# Patient Record
Sex: Female | Born: 1975
Health system: Southern US, Community
[De-identification: ages and names within clinical notes are randomized; demographics above are authoritative.]

## PROBLEM LIST (undated history)

## (undated) DIAGNOSIS — D219 Benign neoplasm of connective and other soft tissue, unspecified: Secondary | ICD-10-CM

## (undated) DIAGNOSIS — A64 Unspecified sexually transmitted disease: Secondary | ICD-10-CM

## (undated) DIAGNOSIS — J45909 Unspecified asthma, uncomplicated: Secondary | ICD-10-CM

## (undated) DIAGNOSIS — D649 Anemia, unspecified: Secondary | ICD-10-CM

## (undated) DIAGNOSIS — M1990 Unspecified osteoarthritis, unspecified site: Secondary | ICD-10-CM

## (undated) DIAGNOSIS — I10 Essential (primary) hypertension: Secondary | ICD-10-CM

## (undated) DIAGNOSIS — M545 Low back pain, unspecified: Secondary | ICD-10-CM

## (undated) DIAGNOSIS — G43909 Migraine, unspecified, not intractable, without status migrainosus: Secondary | ICD-10-CM

## (undated) DIAGNOSIS — F329 Major depressive disorder, single episode, unspecified: Secondary | ICD-10-CM

## (undated) DIAGNOSIS — R079 Chest pain, unspecified: Secondary | ICD-10-CM

## (undated) DIAGNOSIS — G8929 Other chronic pain: Secondary | ICD-10-CM

## (undated) DIAGNOSIS — M542 Cervicalgia: Secondary | ICD-10-CM

## (undated) DIAGNOSIS — F32A Depression, unspecified: Secondary | ICD-10-CM

## (undated) DIAGNOSIS — F419 Anxiety disorder, unspecified: Secondary | ICD-10-CM

## (undated) DIAGNOSIS — R87619 Unspecified abnormal cytological findings in specimens from cervix uteri: Secondary | ICD-10-CM

## (undated) DIAGNOSIS — K219 Gastro-esophageal reflux disease without esophagitis: Secondary | ICD-10-CM

## (undated) DIAGNOSIS — T7840XA Allergy, unspecified, initial encounter: Secondary | ICD-10-CM

## (undated) DIAGNOSIS — Z8719 Personal history of other diseases of the digestive system: Secondary | ICD-10-CM

## (undated) HISTORY — PX: VAGINAL HYSTERECTOMY: SUR661

## (undated) HISTORY — DX: Chest pain, unspecified: R07.9

## (undated) HISTORY — DX: Low back pain: M54.5

## (undated) HISTORY — PX: COLPOSCOPY: SHX161

## (undated) HISTORY — DX: Unspecified abnormal cytological findings in specimens from cervix uteri: R87.619

## (undated) HISTORY — DX: Depression, unspecified: F32.A

## (undated) HISTORY — DX: Personal history of other diseases of the digestive system: Z87.19

## (undated) HISTORY — DX: Migraine, unspecified, not intractable, without status migrainosus: G43.909

## (undated) HISTORY — DX: Low back pain, unspecified: M54.50

## (undated) HISTORY — DX: Anxiety disorder, unspecified: F41.9

## (undated) HISTORY — DX: Benign neoplasm of connective and other soft tissue, unspecified: D21.9

## (undated) HISTORY — DX: Unspecified sexually transmitted disease: A64

## (undated) HISTORY — DX: Allergy, unspecified, initial encounter: T78.40XA

## (undated) HISTORY — PX: ESOPHAGEAL DILATION: SHX303

## (undated) HISTORY — DX: Other chronic pain: G89.29

## (undated) HISTORY — DX: Unspecified osteoarthritis, unspecified site: M19.90

## (undated) HISTORY — PX: TUBAL LIGATION: SHX77

## (undated) HISTORY — DX: Major depressive disorder, single episode, unspecified: F32.9

---

## 2010-01-28 ENCOUNTER — Emergency Department (HOSPITAL_COMMUNITY): Admission: EM | Admit: 2010-01-28 | Discharge: 2010-01-28 | Payer: Self-pay | Admitting: Family Medicine

## 2010-03-03 ENCOUNTER — Emergency Department (HOSPITAL_COMMUNITY): Admission: EM | Admit: 2010-03-03 | Discharge: 2010-03-03 | Payer: Self-pay | Admitting: Family Medicine

## 2010-08-09 ENCOUNTER — Inpatient Hospital Stay (INDEPENDENT_AMBULATORY_CARE_PROVIDER_SITE_OTHER)
Admission: RE | Admit: 2010-08-09 | Discharge: 2010-08-09 | Disposition: A | Discharge status: Home / Self Care | Payer: Self-pay | Source: Ambulatory Visit | Attending: Emergency Medicine | Admitting: Emergency Medicine

## 2010-08-09 DIAGNOSIS — N760 Acute vaginitis: Secondary | ICD-10-CM

## 2010-08-09 DIAGNOSIS — N83209 Unspecified ovarian cyst, unspecified side: Secondary | ICD-10-CM

## 2010-08-09 LAB — POCT URINALYSIS DIPSTICK
Bilirubin Urine: NEGATIVE
Glucose, UA: NEGATIVE mg/dL
Hgb urine dipstick: NEGATIVE
Ketones, ur: NEGATIVE mg/dL
Nitrite: NEGATIVE
Protein, ur: 30 mg/dL — AB
Specific Gravity, Urine: 1.02 (ref 1.005–1.030)
Urobilinogen, UA: 0.2 mg/dL (ref 0.0–1.0)
pH: 6.5 (ref 5.0–8.0)

## 2010-08-09 LAB — WET PREP, GENITAL
Trich, Wet Prep: NONE SEEN
WBC, Wet Prep HPF POC: NONE SEEN
Yeast Wet Prep HPF POC: NONE SEEN

## 2010-08-09 LAB — POCT PREGNANCY, URINE
Preg Test, Ur: NEGATIVE
Preg Test, Ur: NEGATIVE

## 2010-08-10 LAB — GC/CHLAMYDIA PROBE AMP, GENITAL
Chlamydia, DNA Probe: NEGATIVE
GC Probe Amp, Genital: NEGATIVE

## 2010-08-12 LAB — WET PREP, GENITAL
Trich, Wet Prep: NONE SEEN
Trich, Wet Prep: NONE SEEN
WBC, Wet Prep HPF POC: NONE SEEN
WBC, Wet Prep HPF POC: NONE SEEN
Yeast Wet Prep HPF POC: NONE SEEN
Yeast Wet Prep HPF POC: NONE SEEN

## 2010-08-12 LAB — POCT URINALYSIS DIPSTICK
Bilirubin Urine: NEGATIVE
Glucose, UA: NEGATIVE mg/dL
Glucose, UA: NEGATIVE mg/dL
Hgb urine dipstick: NEGATIVE
Ketones, ur: NEGATIVE mg/dL
Nitrite: NEGATIVE
Nitrite: POSITIVE — AB
Protein, ur: NEGATIVE mg/dL
Protein, ur: 30 mg/dL — AB
Specific Gravity, Urine: 1.025 (ref 1.005–1.030)
Specific Gravity, Urine: 1.025 (ref 1.005–1.030)
Urobilinogen, UA: 0.2 mg/dL (ref 0.0–1.0)
Urobilinogen, UA: 1 mg/dL (ref 0.0–1.0)
pH: 6 (ref 5.0–8.0)
pH: 7 (ref 5.0–8.0)

## 2010-08-12 LAB — GC/CHLAMYDIA PROBE AMP, GENITAL
Chlamydia, DNA Probe: NEGATIVE
Chlamydia, DNA Probe: NEGATIVE
GC Probe Amp, Genital: NEGATIVE
GC Probe Amp, Genital: NEGATIVE

## 2010-08-12 LAB — URINE CULTURE
Colony Count: >=100000
Culture  Setup Time: 201109011340

## 2010-09-09 ENCOUNTER — Other Ambulatory Visit (HOSPITAL_COMMUNITY): Payer: Self-pay | Admitting: Internal Medicine

## 2010-09-09 ENCOUNTER — Ambulatory Visit (HOSPITAL_COMMUNITY)
Admission: RE | Admit: 2010-09-09 | Discharge: 2010-09-09 | Disposition: A | Discharge status: Home / Self Care | Payer: Self-pay | Source: Ambulatory Visit | Attending: Internal Medicine | Admitting: Internal Medicine

## 2010-09-09 DIAGNOSIS — R079 Chest pain, unspecified: Secondary | ICD-10-CM | POA: Insufficient documentation

## 2010-09-15 ENCOUNTER — Emergency Department (HOSPITAL_COMMUNITY): Payer: Self-pay

## 2010-09-15 ENCOUNTER — Emergency Department (HOSPITAL_COMMUNITY)
Admission: EM | Admit: 2010-09-15 | Discharge: 2010-09-15 | Disposition: A | Discharge status: Home / Self Care | Payer: Self-pay | Attending: Emergency Medicine | Admitting: Emergency Medicine

## 2010-09-15 DIAGNOSIS — R1013 Epigastric pain: Secondary | ICD-10-CM | POA: Insufficient documentation

## 2010-09-15 DIAGNOSIS — I10 Essential (primary) hypertension: Secondary | ICD-10-CM | POA: Insufficient documentation

## 2010-09-15 DIAGNOSIS — Z79899 Other long term (current) drug therapy: Secondary | ICD-10-CM | POA: Insufficient documentation

## 2010-09-15 DIAGNOSIS — R0602 Shortness of breath: Secondary | ICD-10-CM | POA: Insufficient documentation

## 2010-09-15 DIAGNOSIS — E876 Hypokalemia: Secondary | ICD-10-CM | POA: Insufficient documentation

## 2010-09-15 DIAGNOSIS — R112 Nausea with vomiting, unspecified: Secondary | ICD-10-CM | POA: Insufficient documentation

## 2010-09-15 DIAGNOSIS — R079 Chest pain, unspecified: Secondary | ICD-10-CM | POA: Insufficient documentation

## 2010-09-15 LAB — COMPREHENSIVE METABOLIC PANEL
ALT: 10 U/L (ref 0–35)
AST: 14 U/L (ref 0–37)
Albumin: 3.6 g/dL (ref 3.5–5.2)
Alkaline Phosphatase: 56 U/L (ref 39–117)
BUN: 11 mg/dL (ref 6–23)
CO2: 33 mEq/L — ABNORMAL HIGH (ref 19–32)
Calcium: 9.1 mg/dL (ref 8.4–10.5)
Chloride: 97 mEq/L (ref 96–112)
Creatinine, Ser: 0.65 mg/dL (ref 0.4–1.2)
GFR calc Af Amer: >60 mL/min (ref >60)
GFR calc non Af Amer: >60 mL/min (ref >60)
Glucose, Bld: 87 mg/dL (ref 70–99)
Potassium: 3.2 mEq/L — ABNORMAL LOW (ref 3.5–5.1)
Sodium: 138 mEq/L (ref 135–145)
Total Bilirubin: 0.4 mg/dL (ref 0.3–1.2)
Total Protein: 7.3 g/dL (ref 6.0–8.3)

## 2010-09-15 LAB — DIFFERENTIAL
Basophils Absolute: 0 10*3/uL (ref 0.0–0.1)
Basophils Relative: 0 % (ref 0–1)
Eosinophils Absolute: 0.2 10*3/uL (ref 0.0–0.7)
Eosinophils Relative: 2 % (ref 0–5)
Lymphocytes Relative: 34 % (ref 12–46)
Lymphs Abs: 3.7 10*3/uL (ref 0.7–4.0)
Monocytes Absolute: 0.9 10*3/uL (ref 0.1–1.0)
Monocytes Relative: 8 % (ref 3–12)
Neutro Abs: 6.1 10*3/uL (ref 1.7–7.7)
Neutrophils Relative %: 56 % (ref 43–77)

## 2010-09-15 LAB — URINALYSIS, ROUTINE W REFLEX MICROSCOPIC
Glucose, UA: NEGATIVE mg/dL
Hgb urine dipstick: NEGATIVE
Ketones, ur: 40 mg/dL — AB
Nitrite: NEGATIVE
Protein, ur: NEGATIVE mg/dL
Specific Gravity, Urine: 1.028 (ref 1.005–1.030)
Urobilinogen, UA: 1 mg/dL (ref 0.0–1.0)
pH: 6.5 (ref 5.0–8.0)

## 2010-09-15 LAB — CBC
HCT: 35.9 % — ABNORMAL LOW (ref 36.0–46.0)
Hemoglobin: 11.7 g/dL — ABNORMAL LOW (ref 12.0–15.0)
MCH: 23.4 pg — ABNORMAL LOW (ref 26.0–34.0)
MCHC: 32.6 g/dL (ref 30.0–36.0)
MCV: 71.7 fL — ABNORMAL LOW (ref 78.0–100.0)
Platelets: 259 10*3/uL (ref 150–400)
RBC: 5.01 MIL/uL (ref 3.87–5.11)
RDW: 14.6 % (ref 11.5–15.5)
WBC: 10.9 10*3/uL — ABNORMAL HIGH (ref 4.0–10.5)

## 2010-09-15 LAB — D-DIMER, QUANTITATIVE: D-Dimer, Quant: 0.9 ug/mL-FEU — ABNORMAL HIGH (ref 0.00–0.48)

## 2010-09-15 LAB — LIPASE, BLOOD: Lipase: 17 U/L (ref 11–59)

## 2010-09-15 MED ORDER — IOHEXOL 300 MG/ML  SOLN
90.0000 mL | Freq: Once | INTRAMUSCULAR | Status: AC | PRN
  Administered 2010-09-15: 90 mL via INTRAVENOUS

## 2010-10-10 ENCOUNTER — Emergency Department (HOSPITAL_COMMUNITY)
Admission: EM | Admit: 2010-10-10 | Discharge: 2010-10-10 | Disposition: A | Discharge status: Home / Self Care | Payer: Self-pay | Attending: Emergency Medicine | Admitting: Emergency Medicine

## 2010-10-10 ENCOUNTER — Inpatient Hospital Stay (INDEPENDENT_AMBULATORY_CARE_PROVIDER_SITE_OTHER): Admit: 2010-10-10 | Discharge: 2010-10-10 | Disposition: A | Discharge status: Home / Self Care | Payer: Self-pay

## 2010-10-10 DIAGNOSIS — L0201 Cutaneous abscess of face: Secondary | ICD-10-CM

## 2010-10-10 DIAGNOSIS — I10 Essential (primary) hypertension: Secondary | ICD-10-CM | POA: Insufficient documentation

## 2010-10-10 DIAGNOSIS — R51 Headache: Secondary | ICD-10-CM | POA: Insufficient documentation

## 2010-10-10 DIAGNOSIS — H538 Other visual disturbances: Secondary | ICD-10-CM | POA: Insufficient documentation

## 2010-10-10 DIAGNOSIS — L03211 Cellulitis of face: Secondary | ICD-10-CM

## 2010-10-10 DIAGNOSIS — Z79899 Other long term (current) drug therapy: Secondary | ICD-10-CM | POA: Insufficient documentation

## 2010-10-10 DIAGNOSIS — R220 Localized swelling, mass and lump, head: Secondary | ICD-10-CM | POA: Insufficient documentation

## 2011-03-08 ENCOUNTER — Emergency Department (HOSPITAL_COMMUNITY)
Admission: EM | Admit: 2011-03-08 | Discharge: 2011-03-08 | Disposition: A | Discharge status: Home / Self Care | Payer: Medicaid Other | Attending: Emergency Medicine | Admitting: Emergency Medicine

## 2011-03-08 ENCOUNTER — Emergency Department (HOSPITAL_COMMUNITY): Payer: Medicaid Other

## 2011-03-08 DIAGNOSIS — D649 Anemia, unspecified: Secondary | ICD-10-CM | POA: Insufficient documentation

## 2011-03-08 DIAGNOSIS — N644 Mastodynia: Secondary | ICD-10-CM | POA: Insufficient documentation

## 2011-03-08 DIAGNOSIS — I10 Essential (primary) hypertension: Secondary | ICD-10-CM | POA: Insufficient documentation

## 2011-03-08 DIAGNOSIS — Z79899 Other long term (current) drug therapy: Secondary | ICD-10-CM | POA: Insufficient documentation

## 2011-03-08 DIAGNOSIS — R071 Chest pain on breathing: Secondary | ICD-10-CM | POA: Insufficient documentation

## 2011-03-08 DIAGNOSIS — R112 Nausea with vomiting, unspecified: Secondary | ICD-10-CM | POA: Insufficient documentation

## 2011-03-08 DIAGNOSIS — J45909 Unspecified asthma, uncomplicated: Secondary | ICD-10-CM | POA: Insufficient documentation

## 2011-03-08 DIAGNOSIS — E876 Hypokalemia: Secondary | ICD-10-CM | POA: Insufficient documentation

## 2011-03-08 DIAGNOSIS — M546 Pain in thoracic spine: Secondary | ICD-10-CM | POA: Insufficient documentation

## 2011-03-08 DIAGNOSIS — M542 Cervicalgia: Secondary | ICD-10-CM | POA: Insufficient documentation

## 2011-03-08 DIAGNOSIS — R42 Dizziness and giddiness: Secondary | ICD-10-CM | POA: Insufficient documentation

## 2011-03-08 LAB — BASIC METABOLIC PANEL
BUN: 10 mg/dL (ref 6–23)
CO2: 27 mEq/L (ref 19–32)
Calcium: 9.5 mg/dL (ref 8.4–10.5)
Chloride: 103 mEq/L (ref 96–112)
Creatinine, Ser: 0.71 mg/dL (ref 0.50–1.10)
GFR calc Af Amer: >90 mL/min (ref >90)
GFR calc non Af Amer: >90 mL/min (ref >90)
Glucose, Bld: 93 mg/dL (ref 70–99)
Potassium: 3.4 mEq/L — ABNORMAL LOW (ref 3.5–5.1)
Sodium: 137 mEq/L (ref 135–145)

## 2011-03-08 LAB — DIFFERENTIAL
Basophils Absolute: 0 10*3/uL (ref 0.0–0.1)
Basophils Relative: 0 % (ref 0–1)
Eosinophils Absolute: 0.1 10*3/uL (ref 0.0–0.7)
Eosinophils Relative: 2 % (ref 0–5)
Lymphocytes Relative: 33 % (ref 12–46)
Lymphs Abs: 2.4 10*3/uL (ref 0.7–4.0)
Monocytes Absolute: 0.6 10*3/uL (ref 0.1–1.0)
Monocytes Relative: 8 % (ref 3–12)
Neutro Abs: 4.1 10*3/uL (ref 1.7–7.7)
Neutrophils Relative %: 57 % (ref 43–77)

## 2011-03-08 LAB — CBC
HCT: 35.2 % — ABNORMAL LOW (ref 36.0–46.0)
Hemoglobin: 11.8 g/dL — ABNORMAL LOW (ref 12.0–15.0)
MCH: 23.8 pg — ABNORMAL LOW (ref 26.0–34.0)
MCHC: 33.5 g/dL (ref 30.0–36.0)
MCV: 71.1 fL — ABNORMAL LOW (ref 78.0–100.0)
Platelets: 242 10*3/uL (ref 150–400)
RBC: 4.95 MIL/uL (ref 3.87–5.11)
RDW: 14.8 % (ref 11.5–15.5)
WBC: 7.2 10*3/uL (ref 4.0–10.5)

## 2011-06-27 ENCOUNTER — Ambulatory Visit: Payer: Self-pay | Admitting: Physical Therapy

## 2011-06-28 ENCOUNTER — Ambulatory Visit: Payer: Medicaid Other | Attending: Diagnostic Neuroimaging | Admitting: Physical Therapy

## 2011-06-28 DIAGNOSIS — M545 Low back pain, unspecified: Secondary | ICD-10-CM | POA: Insufficient documentation

## 2011-06-28 DIAGNOSIS — IMO0001 Reserved for inherently not codable concepts without codable children: Secondary | ICD-10-CM | POA: Insufficient documentation

## 2011-06-28 DIAGNOSIS — M542 Cervicalgia: Secondary | ICD-10-CM | POA: Insufficient documentation

## 2011-07-01 ENCOUNTER — Encounter: Payer: Medicaid Other | Admitting: Physical Therapy

## 2011-07-01 ENCOUNTER — Ambulatory Visit: Payer: Medicaid Other | Admitting: Rehabilitative and Restorative Service Providers"

## 2011-07-06 ENCOUNTER — Ambulatory Visit: Payer: Medicaid Other | Attending: Diagnostic Neuroimaging | Admitting: Physical Therapy

## 2011-07-06 DIAGNOSIS — M545 Low back pain, unspecified: Secondary | ICD-10-CM | POA: Insufficient documentation

## 2011-07-06 DIAGNOSIS — M542 Cervicalgia: Secondary | ICD-10-CM | POA: Insufficient documentation

## 2011-07-06 DIAGNOSIS — IMO0001 Reserved for inherently not codable concepts without codable children: Secondary | ICD-10-CM | POA: Insufficient documentation

## 2011-07-06 DIAGNOSIS — M2569 Stiffness of other specified joint, not elsewhere classified: Secondary | ICD-10-CM | POA: Insufficient documentation

## 2011-07-13 ENCOUNTER — Ambulatory Visit: Payer: Medicaid Other | Admitting: Physical Therapy

## 2011-07-22 ENCOUNTER — Ambulatory Visit: Payer: Medicaid Other | Admitting: Physical Therapy

## 2011-11-14 ENCOUNTER — Emergency Department (HOSPITAL_COMMUNITY)
Admission: EM | Admit: 2011-11-14 | Discharge: 2011-11-15 | Disposition: A | Discharge status: Home / Self Care | Payer: Medicaid Other | Attending: Emergency Medicine | Admitting: Emergency Medicine

## 2011-11-14 ENCOUNTER — Emergency Department (HOSPITAL_COMMUNITY): Payer: Medicaid Other

## 2011-11-14 ENCOUNTER — Encounter (HOSPITAL_COMMUNITY): Payer: Self-pay | Admitting: *Deleted

## 2011-11-14 DIAGNOSIS — R079 Chest pain, unspecified: Secondary | ICD-10-CM | POA: Insufficient documentation

## 2011-11-14 DIAGNOSIS — I10 Essential (primary) hypertension: Secondary | ICD-10-CM | POA: Insufficient documentation

## 2011-11-14 DIAGNOSIS — Z79899 Other long term (current) drug therapy: Secondary | ICD-10-CM | POA: Insufficient documentation

## 2011-11-14 DIAGNOSIS — R0789 Other chest pain: Secondary | ICD-10-CM

## 2011-11-14 DIAGNOSIS — Z880 Allergy status to penicillin: Secondary | ICD-10-CM | POA: Insufficient documentation

## 2011-11-14 DIAGNOSIS — R112 Nausea with vomiting, unspecified: Secondary | ICD-10-CM | POA: Insufficient documentation

## 2011-11-14 DIAGNOSIS — Z91041 Radiographic dye allergy status: Secondary | ICD-10-CM | POA: Insufficient documentation

## 2011-11-14 DIAGNOSIS — F17200 Nicotine dependence, unspecified, uncomplicated: Secondary | ICD-10-CM | POA: Insufficient documentation

## 2011-11-14 DIAGNOSIS — Z888 Allergy status to other drugs, medicaments and biological substances status: Secondary | ICD-10-CM | POA: Insufficient documentation

## 2011-11-14 HISTORY — DX: Unspecified asthma, uncomplicated: J45.909

## 2011-11-14 HISTORY — DX: Essential (primary) hypertension: I10

## 2011-11-14 LAB — CBC
HCT: 34.3 % — ABNORMAL LOW (ref 36.0–46.0)
Hemoglobin: 11.2 g/dL — ABNORMAL LOW (ref 12.0–15.0)
MCH: 23.5 pg — ABNORMAL LOW (ref 26.0–34.0)
MCHC: 32.7 g/dL (ref 30.0–36.0)
MCV: 72.1 fL — ABNORMAL LOW (ref 78.0–100.0)
Platelets: 275 10*3/uL (ref 150–400)
RBC: 4.76 MIL/uL (ref 3.87–5.11)
RDW: 14.8 % (ref 11.5–15.5)
WBC: 7.5 10*3/uL (ref 4.0–10.5)

## 2011-11-14 LAB — COMPREHENSIVE METABOLIC PANEL
ALT: 16 U/L (ref 0–35)
AST: 16 U/L (ref 0–37)
Albumin: 3.4 g/dL — ABNORMAL LOW (ref 3.5–5.2)
Alkaline Phosphatase: 71 U/L (ref 39–117)
BUN: 11 mg/dL (ref 6–23)
CO2: 28 mEq/L (ref 19–32)
Calcium: 9 mg/dL (ref 8.4–10.5)
Chloride: 103 mEq/L (ref 96–112)
Creatinine, Ser: 0.72 mg/dL (ref 0.50–1.10)
GFR calc Af Amer: >90 mL/min (ref >90)
GFR calc non Af Amer: >90 mL/min (ref >90)
Glucose, Bld: 83 mg/dL (ref 70–99)
Potassium: 3.6 mEq/L (ref 3.5–5.1)
Sodium: 139 mEq/L (ref 135–145)
Total Bilirubin: 0.2 mg/dL — ABNORMAL LOW (ref 0.3–1.2)
Total Protein: 7.1 g/dL (ref 6.0–8.3)

## 2011-11-14 LAB — DIFFERENTIAL
Basophils Absolute: 0 10*3/uL (ref 0.0–0.1)
Basophils Relative: 0 % (ref 0–1)
Eosinophils Absolute: 0.2 10*3/uL (ref 0.0–0.7)
Eosinophils Relative: 2 % (ref 0–5)
Lymphocytes Relative: 30 % (ref 12–46)
Lymphs Abs: 2.3 10*3/uL (ref 0.7–4.0)
Monocytes Absolute: 0.7 10*3/uL (ref 0.1–1.0)
Monocytes Relative: 9 % (ref 3–12)
Neutro Abs: 4.4 10*3/uL (ref 1.7–7.7)
Neutrophils Relative %: 58 % (ref 43–77)

## 2011-11-14 LAB — URINALYSIS, ROUTINE W REFLEX MICROSCOPIC
Bilirubin Urine: NEGATIVE
Glucose, UA: NEGATIVE mg/dL
Hgb urine dipstick: NEGATIVE
Ketones, ur: NEGATIVE mg/dL
Leukocytes, UA: NEGATIVE
Nitrite: NEGATIVE
Protein, ur: NEGATIVE mg/dL
Specific Gravity, Urine: 1.015 (ref 1.005–1.030)
Urobilinogen, UA: 0.2 mg/dL (ref 0.0–1.0)
pH: 7 (ref 5.0–8.0)

## 2011-11-14 LAB — TROPONIN I: Troponin I: <0.3 ng/mL (ref <0.30)

## 2011-11-14 LAB — PREGNANCY, URINE: Preg Test, Ur: NEGATIVE

## 2011-11-14 MED ORDER — KETOROLAC TROMETHAMINE 60 MG/2ML IM SOLN
60.0000 mg | Freq: Once | INTRAMUSCULAR | Status: AC
  Administered 2011-11-14: 60 mg via INTRAMUSCULAR
  Filled 2011-11-14: qty 2

## 2011-11-14 MED ORDER — OXYCODONE-ACETAMINOPHEN 5-325 MG PO TABS
1.0000 | ORAL_TABLET | Freq: Once | ORAL | Status: AC
  Administered 2011-11-14: 1 via ORAL
  Filled 2011-11-14: qty 1

## 2011-11-14 MED ORDER — GI COCKTAIL ~~LOC~~
30.0000 mL | Freq: Once | ORAL | Status: AC
  Administered 2011-11-14: 30 mL via ORAL
  Filled 2011-11-14: qty 30

## 2011-11-14 NOTE — ED Notes (Signed)
The pt has had chest discomfort abd pain and vomiting with tingling inher rt arm for 3 days.  She has been taking prilosec for the same with no relief

## 2011-11-14 NOTE — ED Provider Notes (Signed)
History     CSN: [id number]  Arrival date & time 11/14/11  1631   First MD Initiated Contact with Patient 11/14/11 2028      Chief Complaint  Patient presents with  . Chest Pain    (Consider location/radiation/quality/duration/timing/severity/associated sxs/prior treatment) HPI Comments: Pt with PMH GERD presents with c/o CP x 3 days - located substernally without radiation, worse with lying flat or after meals. Associated with several episodes of nonbloody nonbilious emesis. Has had occasional tingling to R arm. Has taken Prilosec daily, up to 3 tabs (60 mg) without relief. No other tx prior to arrival.  Patient has no prior history of DVT/PE. Denies recent trauma, surgery, or prolonged immobilization. Denies hemoptysis, leg swelling, or use of exogenous estrogen.  Patient is a 36 y.o. female presenting with chest pain. The history is provided by the patient.  Chest Pain The chest pain began 3 - 5 days ago. Chest pain occurs intermittently. The chest pain is unchanged. The pain is associated with eating. The severity of the pain is moderate. The quality of the pain is described as heavy. The pain does not radiate. Chest pain is worsened by certain positions. Primary symptoms include nausea and vomiting. Pertinent negatives for primary symptoms include no fever, no fatigue, no syncope, no shortness of breath, no cough, no wheezing, no palpitations, no abdominal pain, no dizziness and no altered mental status.  Pertinent negatives for associated symptoms include no diaphoresis, no lower extremity edema, no numbness, no orthopnea, no paroxysmal nocturnal dyspnea and no weakness. She tried proton pump inhibitors for the symptoms.     Past Medical History  Diagnosis Date  . Asthma   . Hypertension     No past surgical history on file.  No family history on file.  History  Substance Use Topics  . Smoking status: Current Everyday Smoker  . Smokeless tobacco: Not on file  . Alcohol  Use: No  Pt denies etoh use  OB History    Grav Para Term Preterm Abortions TAB SAB Ect Mult Living                  Review of Systems  Constitutional: Negative for fever, diaphoresis and fatigue.  Respiratory: Negative for cough, shortness of breath and wheezing.   Cardiovascular: Positive for chest pain. Negative for palpitations, orthopnea and syncope.  Gastrointestinal: Positive for nausea and vomiting. Negative for abdominal pain.  Neurological: Negative for dizziness, weakness and numbness.  Psychiatric/Behavioral: Negative for altered mental status.    Allergies  Reglan; Amoxicillin; and Contrast media  Home Medications   Current Outpatient Rx  Name Route Sig Dispense Refill  . DULOXETINE HCL 60 MG PO CPEP Oral Take 60 mg by mouth daily.    Marland Kitchen HYDROCHLOROTHIAZIDE 25 MG PO TABS Oral Take 25 mg by mouth daily.    Marland Kitchen OMEPRAZOLE 20 MG PO CPDR Oral Take 20 mg by mouth daily as needed. For indigestion    . OXYCODONE-ACETAMINOPHEN 5-325 MG PO TABS Oral Take 1 tablet by mouth 4 (four) times daily.      BP 157/104  Pulse 67  Temp 98.1 F (36.7 C) (Oral)  Resp 16  SpO2 100%  Physical Exam  Nursing note and vitals reviewed. Constitutional: She appears well-developed and well-nourished. No distress.  HENT:  Head: Normocephalic and atraumatic.  Mouth/Throat: Oropharynx is clear and moist. No oropharyngeal exudate.  Eyes: EOM are normal. Pupils are equal, round, and reactive to light.  Neck: Normal range of motion.  Cardiovascular: Normal rate, regular rhythm and normal heart sounds.  Exam reveals no gallop and no friction rub.   No murmur heard. Pulmonary/Chest: Effort normal and breath sounds normal. She has no wheezes. She exhibits tenderness.       Reproducibly tender over sternum  Abdominal: Soft. Bowel sounds are normal. There is no tenderness. There is no rebound and no guarding.  Musculoskeletal: Normal range of motion.  Neurological: She is alert.  Skin: Skin is  warm and dry. She is not diaphoretic.  Psychiatric: She has a normal mood and affect.    ED Course  Procedures (including critical care time)   Date: 11/14/2011  Rate: 84  Rhythm: normal sinus rhythm  QRS Axis: normal  Intervals: normal  ST/T Wave abnormalities: normal  Conduction Disutrbances:none  Narrative Interpretation:   Old EKG Reviewed: unchanged  Labs Reviewed  CBC - Abnormal; Notable for the following:    Hemoglobin 11.2 (*)     HCT 34.3 (*)     MCV 72.1 (*)     MCH 23.5 (*)     All other components within normal limits  COMPREHENSIVE METABOLIC PANEL - Abnormal; Notable for the following:    Albumin 3.4 (*)     Total Bilirubin 0.2 (*)     All other components within normal limits  URINALYSIS, ROUTINE W REFLEX MICROSCOPIC  PREGNANCY, URINE  DIFFERENTIAL  TROPONIN I   Dg Chest 2 View  11/14/2011  *RADIOLOGY REPORT*  Clinical Data: Chest pain, shortness of breath.  CHEST - 2 VIEW  Comparison: 03/08/2011  Findings: Heart and mediastinal contours are within normal limits. No focal opacities or effusions.  No acute bony abnormality.  IMPRESSION: No active cardiopulmonary disease.  Original Report Authenticated By: Cyndie Chime, M.D.     No diagnosis found.    MDM  Pt with reported several days of chest pain, associated with lying flat, associated with nausea/vomiting. Pain is reproducible over the sternum. Pt's labs appear reassuring. She has taken Prilosec at home without significant relief - she was initially administered Toradol here without much sx relief. Plan to re-medicate with GI cocktail. Based on pt's symptoms, vitals, ECG, and history, I do not suspect worrisome etiology such as ACS or PE.  Signed out to [place], PA-C at 11 PM. Anticipate pt can be dced home.      Grant Fontana, PA-C 11/14/11 2300

## 2011-11-14 NOTE — ED Provider Notes (Signed)
Medical screening examination/treatment/procedure(s) were performed by non-physician practitioner and as supervising physician I was immediately available for consultation/collaboration.   Dhanya Bogle B. Bernette Mayers, MD 11/14/11 2300

## 2011-11-15 NOTE — Discharge Instructions (Signed)
Pain of Unknown Etiology (Pain Without a Known Cause) You have come to your caregiver because of pain. Pain can occur in any part of the body. Often there is not a definite cause. If your laboratory (blood or urine) work was normal and x-rays or other studies were normal, your caregiver may treat you without knowing the cause of the pain. An example of this is the headache. Most headaches are diagnosed by taking a history. This means your caregiver asks you questions about your headaches. Your caregiver determines a treatment based on your answers. Usually testing done for headaches is normal. Often testing is not done unless there is no response to medications. Regardless of where your pain is located today, you can be given medications to make you comfortable. If no physical cause of pain can be found, most cases of pain will gradually leave as suddenly as they came.  If you have a painful condition and no reason can be found for the pain, It is importantthat you follow up with your caregiver. If the pain becomes worse or does not go away, it may be necessary to repeat tests and look further for a possible cause.  Only take over-the-counter or prescription medicines for pain, discomfort, or fever as directed by your caregiver.   For the protection of your privacy, test results can not be given over the phone. Make sure you receive the results of your test. Ask as to how these results are to be obtained if you have not been informed. It is your responsibility to obtain your test results.   You may continue all activities unless the activities cause more pain. When the pain lessens, it is important to gradually resume normal activities. Resume activities by beginning slowly and gradually increasing the intensity and duration of the activities or exercise. During periods of severe pain, bed-rest may be helpful. Lay or sit in any position that is comfortable.   Ice used for acute (sudden) conditions may be  effective. Use a large plastic bag filled with ice and wrapped in a towel. This may provide pain relief.   See your caregiver for continued problems. They can help or refer you for exercises or physical therapy if necessary.  If you were given medications for your condition, do not drive, operate machinery or power tools, or sign legal documents for 24 hours. Do not drink alcohol, take sleeping pills, or take other medications that may interfere with treatment. See your caregiver immediately if you have pain that is becoming worse and not relieved by medications. Document Released: 02/08/2001 Document Revised: 05/05/2011 Document Reviewed: 05/16/2005 [employer] Patient Information 2012 [employer]. 

## 2011-11-15 NOTE — ED Notes (Signed)
No rx given, pt voiced understanding to f/u with Jovita Kussmaul of GI doctor and return for worsening of sx

## 2011-11-15 NOTE — ED Provider Notes (Signed)
Medical screening examination/treatment/procedure(s) were performed by non-physician practitioner and as supervising physician I was immediately available for consultation/collaboration.  Flint Melter, MD 11/15/11 1945

## 2011-11-15 NOTE — ED Provider Notes (Signed)
History     CSN: [id number]  Arrival date & time 11/14/11  1631   First MD Initiated Contact with Patient 11/14/11 2028      Chief Complaint  Patient presents with  . Chest Pain    (Consider location/radiation/quality/duration/timing/severity/associated sxs/prior treatment) HPI  Past Medical History  Diagnosis Date  . Asthma   . Hypertension     No past surgical history on file.  No family history on file.  History  Substance Use Topics  . Smoking status: Current Everyday Smoker  . Smokeless tobacco: Not on file  . Alcohol Use: No    OB History    Grav Para Term Preterm Abortions TAB SAB Ect Mult Living                  Review of Systems  Allergies  Reglan; Amoxicillin; and Contrast media  Home Medications   Current Outpatient Rx  Name Route Sig Dispense Refill  . DULOXETINE HCL 60 MG PO CPEP Oral Take 60 mg by mouth daily.    Marland Kitchen HYDROCHLOROTHIAZIDE 25 MG PO TABS Oral Take 25 mg by mouth daily.    Marland Kitchen OMEPRAZOLE 20 MG PO CPDR Oral Take 20 mg by mouth daily as needed. For indigestion    . OXYCODONE-ACETAMINOPHEN 5-325 MG PO TABS Oral Take 1 tablet by mouth 4 (four) times daily.      BP 148/90  Pulse 67  Temp 97.8 F (36.6 C) (Oral)  Resp 18  SpO2 100%  Physical Exam  ED Course  Procedures (including critical care time)  Labs Reviewed  CBC - Abnormal; Notable for the following:    Hemoglobin 11.2 (*)     HCT 34.3 (*)     MCV 72.1 (*)     MCH 23.5 (*)     All other components within normal limits  COMPREHENSIVE METABOLIC PANEL - Abnormal; Notable for the following:    Albumin 3.4 (*)     Total Bilirubin 0.2 (*)     All other components within normal limits  URINALYSIS, ROUTINE W REFLEX MICROSCOPIC  PREGNANCY, URINE  DIFFERENTIAL  TROPONIN I   Dg Chest 2 View  11/14/2011  *RADIOLOGY REPORT*  Clinical Data: Chest pain, shortness of breath.  CHEST - 2 VIEW  Comparison: 03/08/2011  Findings: Heart and mediastinal contours are within normal  limits. No focal opacities or effusions.  No acute bony abnormality.  IMPRESSION: No active cardiopulmonary disease.  Original Report Authenticated By: Cyndie Chime, M.D.     Atypical chest pain   MDM  Care of patient taken over from C. Mayford Knife, PA-C - please see her note for HPI, ROS and PE - patient reports no relief in pain - states she thinks this is her GERD and has been taking double the amount of prilosec as well as daily percocet without relief of pain - there is no evidence for ACS, patient will be discharged home with follow up with PCP.        Izola Price [place], [place] 11/15/11 [phone]

## 2011-11-25 ENCOUNTER — Encounter: Payer: Self-pay | Admitting: Gastroenterology

## 2011-11-29 ENCOUNTER — Telehealth: Payer: Self-pay | Admitting: Gastroenterology

## 2011-11-29 ENCOUNTER — Ambulatory Visit (INDEPENDENT_AMBULATORY_CARE_PROVIDER_SITE_OTHER): Payer: Medicaid Other | Admitting: Gastroenterology

## 2011-11-29 ENCOUNTER — Encounter: Payer: Self-pay | Admitting: Gastroenterology

## 2011-11-29 VITALS — BP 116/80 | HR 68 | Ht 64.0 in | Wt 197.2 lb

## 2011-11-29 DIAGNOSIS — R1310 Dysphagia, unspecified: Secondary | ICD-10-CM

## 2011-11-29 NOTE — Telephone Encounter (Signed)
Left message on machine to call back  

## 2011-11-29 NOTE — Telephone Encounter (Signed)
Pt was advised that we have no sooner appts and she would be put on a wait list.  Pt will call back if she has increasing pain

## 2011-11-29 NOTE — Patient Instructions (Addendum)
You will be set up for an upper endoscopy at [facility] MAC sedation, possible dilation (for dysphagia/odynophagia). Smaller, more frequent meals (3-4 meals a day)

## 2011-11-29 NOTE — Progress Notes (Signed)
HPI: This is a  very pleasant 36-year-old woman whom I am meeting for the first time today.    2 weeks ago labs showed she had slight microcytic anemia. Complete metabolic profile was normal. Urinalysis was normal and pregnancy test was negative.  CXR for chest pain, SOB 2 weeks ago was normal.  About 2 weeks of solid dysphagia.  Usually waits and it will pass, sometimes will have to vomit.  Has odynophagia.  Overall she gained 50 pounds in 2 months.    Was given predisone and cipro by her PCP.  No abx 2-3 months ago.  She had EGD many years ago for the exact same problem.  She was told she had acid related problems.  Currently on prilosec for a long time,   She eats fruit throughout the day, but other than that one large meal a day.  On narcotics for back pains  Review of systems: Pertinent positive and negative review of systems were noted in the above HPI section. Complete review of systems was performed and was otherwise normal.    Past Medical History  Diagnosis Date  . Asthma   . Hypertension     Past Surgical History  Procedure Date  . Vaginal hysterectomy     Current Outpatient Prescriptions  Medication Sig Dispense Refill  . amLODipine (NORVASC) 5 MG tablet Take 5 mg by mouth daily.      . DULoxetine (CYMBALTA) 60 MG capsule Take 60 mg by mouth daily.      . hydrochlorothiazide (HYDRODIURIL) 25 MG tablet Take 25 mg by mouth daily.      . omeprazole (PRILOSEC) 20 MG capsule Take 20 mg by mouth daily as needed. For indigestion      . oxyCODONE-acetaminophen (PERCOCET) 5-325 MG per tablet Take 1 tablet by mouth 4 (four) times daily.        Allergies as of 11/29/2011 - Review Complete 11/29/2011  Allergen Reaction Noted  . Reglan (metoclopramide) Shortness Of Breath 11/14/2011  . Amoxicillin Rash 11/14/2011  . Contrast media (iodinated diagnostic agents) Rash 11/14/2011    Family History  Problem Relation Age of Onset  . Heart disease Father   . Hypertension  Mother     History   Social History  . Marital Status: Married    Spouse Name: N/A    Number of Children: 2  . Years of Education: N/A   Occupational History  . Homemaker    Social History Main Topics  . Smoking status: Current Some Day Smoker  . Smokeless tobacco: Never Used  . Alcohol Use: No  . Drug Use: No  . Sexually Active: Not on file   Other Topics Concern  . Not on file   Social History Narrative  . No narrative on file       Physical Exam: BP 116/80  Pulse 68  Ht 5' 4" (1.626 m)  Wt 197 lb 3.2 oz (89.449 kg)  BMI 33.85 kg/m2 Constitutional: generally well-appearing Psychiatric: alert and oriented x3 Eyes: extraocular movements intact Mouth: oral pharynx moist, no lesions Neck: supple no lymphadenopathy Cardiovascular: heart regular rate and rhythm Lungs: clear to auscultation bilaterally Abdomen: soft, nontender, nondistended, no obvious ascites, no peritoneal signs, normal bowel sounds Extremities: no lower extremity edema bilaterally Skin: no lesions on visible extremities    Assessment and plan: 36 y.o. female with   several weeks of dysphagia, odynophagia  she has gained 50 pounds in 2 months she tells me. This could certainly cause a lot   of GI symptoms. He does seem that she has dysphagia to solid foods and we'll proceed with EGD at her soonest convenience. Perhaps she has esophageal narrowing. Gastroparesis can call us gastric retention and this can feel similar to dysphagia to some patients. Recommended that she try eating several smaller more frequent meals a day. Chronic narcotics could predispose her to gastroparesis.     

## 2011-12-22 ENCOUNTER — Encounter (HOSPITAL_COMMUNITY): Payer: Self-pay | Admitting: Anesthesiology

## 2011-12-22 ENCOUNTER — Ambulatory Visit (HOSPITAL_COMMUNITY): Payer: Medicaid Other | Admitting: Anesthesiology

## 2011-12-22 ENCOUNTER — Ambulatory Visit (HOSPITAL_COMMUNITY)
Admission: RE | Admit: 2011-12-22 | Discharge: 2011-12-22 | Disposition: A | Discharge status: Home / Self Care | Payer: Medicaid Other | Source: Ambulatory Visit | Attending: Gastroenterology | Admitting: Gastroenterology

## 2011-12-22 ENCOUNTER — Encounter (HOSPITAL_COMMUNITY)
Admission: RE | Disposition: A | Discharge status: Home / Self Care | Payer: Self-pay | Source: Ambulatory Visit | Attending: Gastroenterology

## 2011-12-22 ENCOUNTER — Encounter (HOSPITAL_COMMUNITY): Payer: Self-pay

## 2011-12-22 DIAGNOSIS — I10 Essential (primary) hypertension: Secondary | ICD-10-CM | POA: Insufficient documentation

## 2011-12-22 DIAGNOSIS — R635 Abnormal weight gain: Secondary | ICD-10-CM | POA: Insufficient documentation

## 2011-12-22 DIAGNOSIS — Z79899 Other long term (current) drug therapy: Secondary | ICD-10-CM | POA: Insufficient documentation

## 2011-12-22 DIAGNOSIS — K222 Esophageal obstruction: Secondary | ICD-10-CM | POA: Insufficient documentation

## 2011-12-22 DIAGNOSIS — R1310 Dysphagia, unspecified: Secondary | ICD-10-CM

## 2011-12-22 HISTORY — DX: Anemia, unspecified: D64.9

## 2011-12-22 HISTORY — DX: Migraine, unspecified, not intractable, without status migrainosus: G43.909

## 2011-12-22 SURGERY — ESOPHAGOGASTRODUODENOSCOPY (EGD) WITH PROPOFOL
Anesthesia: Monitor Anesthesia Care

## 2011-12-22 MED ORDER — MIDAZOLAM HCL 5 MG/5ML IJ SOLN
INTRAMUSCULAR | Status: DC | PRN
  Administered 2011-12-22: 2 mg via INTRAVENOUS

## 2011-12-22 MED ORDER — PROPOFOL 10 MG/ML IV EMUL
INTRAVENOUS | Status: DC | PRN
  Administered 2011-12-22: 30 mg via INTRAVENOUS
  Administered 2011-12-22: 40 mg via INTRAVENOUS

## 2011-12-22 MED ORDER — PROPOFOL 10 MG/ML IV EMUL
INTRAVENOUS | Status: DC | PRN
  Administered 2011-12-22: 75 ug/kg/min via INTRAVENOUS

## 2011-12-22 MED ORDER — LACTATED RINGERS IV SOLN
INTRAVENOUS | Status: DC
  Administered 2011-12-22: 09:00:00 via INTRAVENOUS

## 2011-12-22 MED ORDER — FENTANYL CITRATE 0.05 MG/ML IJ SOLN
INTRAMUSCULAR | Status: DC | PRN
  Administered 2011-12-22: 100 ug via INTRAVENOUS

## 2011-12-22 SURGICAL SUPPLY — 14 items

## 2011-12-22 NOTE — Transfer of Care (Signed)
Immediate Anesthesia Transfer of Care Note  Patient: Suzanne [name]  Procedure(s) Performed: Procedure(s) (LRB): ESOPHAGOGASTRODUODENOSCOPY (EGD) WITH PROPOFOL (N/A)  Patient Location: Endoscopy Unit  Anesthesia Type: MAC  Level of Consciousness: awake and alert   Airway & Oxygen Therapy: Patient Spontanous Breathing and Patient connected to nasal cannula oxygen  Post-op Assessment: Report given to PACU RN and Post -op Vital signs reviewed and stable  Post vital signs: Reviewed and stable  Complications: No apparent anesthesia complications

## 2011-12-22 NOTE — Interval H&P Note (Signed)
History and Physical Interval Note:  12/22/2011 9:02 AM  Suzanne [name]  has presented today for surgery, with the diagnosis of GERD (gastroesophageal reflux disease) [530.81] Esophageal pain [530.89]  The various methods of treatment have been discussed with the patient and family. After consideration of risks, benefits and other options for treatment, the patient has consented to  Procedure(s) (LRB): ESOPHAGOGASTRODUODENOSCOPY (EGD) WITH PROPOFOL (N/A) as a surgical intervention .  The patient's history has been reviewed, patient examined, no change in status, stable for surgery.  I have reviewed the patient's chart and labs.  Questions were answered to the patient's satisfaction.     Rob Bunting

## 2011-12-22 NOTE — Anesthesia Postprocedure Evaluation (Signed)
  Anesthesia Post-op Note  Patient: Suzanne [name]  Procedure(s) Performed: Procedure(s) (LRB): ESOPHAGOGASTRODUODENOSCOPY (EGD) WITH PROPOFOL (N/A)  Patient Location: PACU  Anesthesia Type: MAC  Level of Consciousness: awake and alert   Airway and Oxygen Therapy: Patient Spontanous Breathing  Post-op Pain: mild  Post-op Assessment: Post-op Vital signs reviewed, Patient's Cardiovascular Status Stable, Respiratory Function Stable, Patent Airway and No signs of Nausea or vomiting  Post-op Vital Signs: stable  Complications: No apparent anesthesia complications

## 2011-12-22 NOTE — Preoperative (Signed)
Beta Blockers   Reason not to administer Beta Blockers:pt took norvasc this am

## 2011-12-22 NOTE — H&P (View-Only) (Signed)
HPI: This is a  very pleasant 36 year old woman whom I am meeting for the first time today.    2 weeks ago labs showed she had slight microcytic anemia. Complete metabolic profile was normal. Urinalysis was normal and pregnancy test was negative.  CXR for chest pain, SOB 2 weeks ago was normal.  About 2 weeks of solid dysphagia.  Usually waits and it will pass, sometimes will have to vomit.  Has odynophagia.  Overall she gained 50 pounds in 2 months.    Was given predisone and cipro by her PCP.  No abx 2-3 months ago.  She had EGD many years ago for the exact same problem.  She was told she had acid related problems.  Currently on prilosec for a long time,   She eats fruit throughout the day, but other than that one large meal a day.  On narcotics for back pains  Review of systems: Pertinent positive and negative review of systems were noted in the above HPI section. Complete review of systems was performed and was otherwise normal.    Past Medical History  Diagnosis Date  . Asthma   . Hypertension     Past Surgical History  Procedure Date  . Vaginal hysterectomy     Current Outpatient Prescriptions  Medication Sig Dispense Refill  . amLODipine (NORVASC) 5 MG tablet Take 5 mg by mouth daily.      . DULoxetine (CYMBALTA) 60 MG capsule Take 60 mg by mouth daily.      . hydrochlorothiazide (HYDRODIURIL) 25 MG tablet Take 25 mg by mouth daily.      Marland Kitchen omeprazole (PRILOSEC) 20 MG capsule Take 20 mg by mouth daily as needed. For indigestion      . oxyCODONE-acetaminophen (PERCOCET) 5-325 MG per tablet Take 1 tablet by mouth 4 (four) times daily.        Allergies as of 11/29/2011 - Review Complete 11/29/2011  Allergen Reaction Noted  . Reglan (metoclopramide) Shortness Of Breath 11/14/2011  . Amoxicillin Rash 11/14/2011  . Contrast media (iodinated diagnostic agents) Rash 11/14/2011    Family History  Problem Relation Age of Onset  . Heart disease Father   . Hypertension  Mother     History   Social History  . Marital Status: Married    Spouse Name: N/A    Number of Children: 2  . Years of Education: N/A   Occupational History  . Homemaker    Social History Main Topics  . Smoking status: Current Some Day Smoker  . Smokeless tobacco: Never Used  . Alcohol Use: No  . Drug Use: No  . Sexually Active: Not on file   Other Topics Concern  . Not on file   Social History Narrative  . No narrative on file       Physical Exam: BP 116/80  Pulse 68  Ht 5\' 4"  (1.626 m)  Wt 197 lb 3.2 oz (89.449 kg)  BMI 33.85 kg/m2 Constitutional: generally well-appearing Psychiatric: alert and oriented x3 Eyes: extraocular movements intact Mouth: oral pharynx moist, no lesions Neck: supple no lymphadenopathy Cardiovascular: heart regular rate and rhythm Lungs: clear to auscultation bilaterally Abdomen: soft, nontender, nondistended, no obvious ascites, no peritoneal signs, normal bowel sounds Extremities: no lower extremity edema bilaterally Skin: no lesions on visible extremities    Assessment and plan: 36 y.o. female with   several weeks of dysphagia, odynophagia  she has gained 50 pounds in 2 months she tells me. This could certainly cause a lot  of GI symptoms. He does seem that she has dysphagia to solid foods and we'll proceed with EGD at her soonest convenience. Perhaps she has esophageal narrowing. Gastroparesis can call us gastric retention and this can feel similar to dysphagia to some patients. Recommended that she try eating several smaller more frequent meals a day. Chronic narcotics could predispose her to gastroparesis.

## 2011-12-22 NOTE — Op Note (Signed)
[facility] [address] [place], [place]  [address]  ENDOSCOPY PROCEDURE REPORT  PATIENT:  Suzanne [name], Suzanne [name]  MR#:  [id number] BIRTHDATE:  01-Sep-1975, 36 yrs. old  GENDER:  female ENDOSCOPIST:  Rachael Fee, MD Referred by:  Clyda Greener, M.D. PROCEDURE DATE:  12/22/2011 PROCEDURE:  EGD with balloon dilatation ASA CLASS:  Class II INDICATIONS:  intermittent dysphagia, large weight gain recently MEDICATIONS:   MAC sedation, administered by CRNA TOPICAL ANESTHETIC:  Cetacaine Spray  DESCRIPTION OF PROCEDURE:   After the risks benefits and alternatives of the procedure were thoroughly explained, informed consent was obtained.  The Pentax Gastroscope [id number] endoscope was introduced through the mouth and advanced to the second portion of the duodenum, without limitations.  The instrument was slowly withdrawn as the mucosa was fully examined. <<PROCEDUREIMAGES>> There was mild narrowing at GE junction, this was smooth, focal, benign appearing with normal mucosa. This was dilated using 20mm CRE TTS balloon held inflated for 1 minute (see image5 and image8).  Otherwise the examination was normal (see image1, image2, image3, and image4).    Retroflexed views revealed no abnormalities.    The scope was then withdrawn from the patient and the procedure completed. COMPLICATIONS:  None  ENDOSCOPIC IMPRESSION: 1) Benign, smooth, mild narrowing at GE junction. This was dilated to 20mm. 2) Otherwise normal examination  RECOMMENDATIONS: Chew your food well, eat slowly and take small bites.  Losing weight gradually can help GI symptoms as well.  ______________________________ Rachael Fee, MD  n. eSIGNED:   Rachael Fee at 12/22/2011 10:22 AM  Leonides Sake, [id number]

## 2011-12-22 NOTE — Anesthesia Preprocedure Evaluation (Signed)
Anesthesia Evaluation  Patient identified by MRN, date of birth, ID band Patient awake    Reviewed: Allergy & Precautions, H&P , NPO status , Patient's Chart, lab work & pertinent test results  Airway Mallampati: II TM Distance: <3 FB Neck ROM: Full    Dental No notable dental hx.    Pulmonary neg pulmonary ROS, Current Smoker,  breath sounds clear to auscultation  Pulmonary exam normal       Cardiovascular hypertension, Pt. on medications Rhythm:Regular Rate:Normal     Neuro/Psych negative neurological ROS  negative psych ROS   GI/Hepatic Neg liver ROS, GERD-  Medicated,  Endo/Other  negative endocrine ROS  Renal/GU negative Renal ROS  negative genitourinary   Musculoskeletal negative musculoskeletal ROS (+)   Abdominal   Peds negative pediatric ROS (+)  Hematology negative hematology ROS (+)   Anesthesia Other Findings   Reproductive/Obstetrics negative OB ROS                           Anesthesia Physical Anesthesia Plan  ASA: II  Anesthesia Plan: MAC   Post-op Pain Management:    Induction: Intravenous  Airway Management Planned: Nasal Cannula  Additional Equipment:   Intra-op Plan:   Post-operative Plan:   Informed Consent: I have reviewed the patients History and Physical, chart, labs and discussed the procedure including the risks, benefits and alternatives for the proposed anesthesia with the patient or authorized representative who has indicated his/her understanding and acceptance.   Dental advisory given  Plan Discussed with: CRNA and Surgeon  Anesthesia Plan Comments:         Anesthesia Quick Evaluation

## 2012-08-15 ENCOUNTER — Telehealth: Payer: Self-pay | Admitting: Diagnostic Neuroimaging

## 2012-08-15 NOTE — Telephone Encounter (Signed)
Pt called and wanting a refill for imitrex which has worked well for her.   Have not seen her since 05-2011.  Needs appts.  Early Friday am or Tuesday am.

## 2012-08-16 NOTE — Telephone Encounter (Signed)
Sched appt for 08/31/12 @ 10

## 2012-08-31 ENCOUNTER — Ambulatory Visit: Payer: Self-pay | Admitting: Diagnostic Neuroimaging

## 2012-10-08 ENCOUNTER — Encounter (HOSPITAL_COMMUNITY): Payer: Self-pay

## 2012-10-08 ENCOUNTER — Emergency Department (HOSPITAL_COMMUNITY)
Admission: EM | Admit: 2012-10-08 | Discharge: 2012-10-08 | Disposition: A | Discharge status: Home / Self Care | Payer: Medicaid Other | Attending: Emergency Medicine | Admitting: Emergency Medicine

## 2012-10-08 DIAGNOSIS — X58XXXA Exposure to other specified factors, initial encounter: Secondary | ICD-10-CM | POA: Insufficient documentation

## 2012-10-08 DIAGNOSIS — Z79899 Other long term (current) drug therapy: Secondary | ICD-10-CM | POA: Insufficient documentation

## 2012-10-08 DIAGNOSIS — F17200 Nicotine dependence, unspecified, uncomplicated: Secondary | ICD-10-CM | POA: Insufficient documentation

## 2012-10-08 DIAGNOSIS — Y939 Activity, unspecified: Secondary | ICD-10-CM | POA: Insufficient documentation

## 2012-10-08 DIAGNOSIS — T148XXA Other injury of unspecified body region, initial encounter: Secondary | ICD-10-CM

## 2012-10-08 DIAGNOSIS — Z880 Allergy status to penicillin: Secondary | ICD-10-CM | POA: Insufficient documentation

## 2012-10-08 DIAGNOSIS — Z862 Personal history of diseases of the blood and blood-forming organs and certain disorders involving the immune mechanism: Secondary | ICD-10-CM | POA: Insufficient documentation

## 2012-10-08 DIAGNOSIS — S139XXA Sprain of joints and ligaments of unspecified parts of neck, initial encounter: Secondary | ICD-10-CM | POA: Insufficient documentation

## 2012-10-08 DIAGNOSIS — J069 Acute upper respiratory infection, unspecified: Secondary | ICD-10-CM | POA: Insufficient documentation

## 2012-10-08 DIAGNOSIS — J45909 Unspecified asthma, uncomplicated: Secondary | ICD-10-CM | POA: Insufficient documentation

## 2012-10-08 DIAGNOSIS — J029 Acute pharyngitis, unspecified: Secondary | ICD-10-CM

## 2012-10-08 DIAGNOSIS — R6889 Other general symptoms and signs: Secondary | ICD-10-CM | POA: Insufficient documentation

## 2012-10-08 DIAGNOSIS — Y929 Unspecified place or not applicable: Secondary | ICD-10-CM | POA: Insufficient documentation

## 2012-10-08 DIAGNOSIS — I10 Essential (primary) hypertension: Secondary | ICD-10-CM | POA: Insufficient documentation

## 2012-10-08 DIAGNOSIS — Z8679 Personal history of other diseases of the circulatory system: Secondary | ICD-10-CM | POA: Insufficient documentation

## 2012-10-08 LAB — RAPID STREP SCREEN (MED CTR MEBANE ONLY): Streptococcus, Group A Screen (Direct): NEGATIVE

## 2012-10-08 MED ORDER — METHOCARBAMOL 500 MG PO TABS: 500.0000 mg | ORAL_TABLET | Freq: Two times a day (BID) | ORAL | Status: DC

## 2012-10-08 NOTE — ED Provider Notes (Signed)
History     CSN: [id number]  Arrival date & time 10/08/12  9562   First MD Initiated Contact with Patient 10/08/12 0636      Chief Complaint  Patient presents with  . Sore Throat    (Consider location/radiation/quality/duration/timing/severity/associated sxs/prior treatment) HPI Comments: Patient presents emergency department with chief complaint of sore throat and runny nose. She states that she has been having a sore throat times one week. She has not tried anything to alleviate her symptoms. She denies fevers, chills, nausea, vomiting, or cough. Additionally, she states that she has had muscle tightness in her neck. She states that she felt like she slept wrong, and has had a "kink" in her neck. She states that the pain/tightness radiates up her neck and to her ear.  The pain is moderate.  The history is provided by the patient. No language interpreter was used.    Past Medical History  Diagnosis Date  . Asthma   . Hypertension   . Migraine   . Anemia     Past Surgical History  Procedure Laterality Date  . Vaginal hysterectomy    . Tubal ligation      Family History  Problem Relation Age of Onset  . Heart disease Father   . Hypertension Mother     History  Substance Use Topics  . Smoking status: Current Some Day Smoker -- 0.10 packs/day for 10 years  . Smokeless tobacco: Never Used  . Alcohol Use: No    OB History   Grav Para Term Preterm Abortions TAB SAB Ect Mult Living   2 2  2             Review of Systems  All other systems reviewed and are negative.    Allergies  Reglan; Amoxicillin; and Contrast media  Home Medications   Current Outpatient Rx  Name  Route  Sig  Dispense  Refill  . amLODipine (NORVASC) 5 MG tablet   Oral   Take 5 mg by mouth daily.         . hydrochlorothiazide (HYDRODIURIL) 25 MG tablet   Oral   Take 25 mg by mouth daily.         Marland Kitchen oxyCODONE-acetaminophen (PERCOCET) 5-325 MG per tablet   Oral   Take 1 tablet by  mouth 4 (four) times daily.           BP 163/99  Pulse 66  Temp(Src) 98.6 F (37 C) (Oral)  Resp 17  Ht 5\' 4"  (1.626 m)  SpO2 99%  Physical Exam  Nursing note and vitals reviewed. Constitutional: She is oriented to person, place, and time. She appears well-developed and well-nourished.  HENT:  Head: Normocephalic and atraumatic.  Red oropharynx, without presence of exudates, no signs of tonsillar or peritonsillar abscesses, uvula is midline, airway is intact, no sign of Ludwig's angina, no cervical spine tenderness, no pain on palpation of posterior neck, no signs of meningismus.  Eyes: Conjunctivae and EOM are normal.  Neck: Normal range of motion.  Left-sided SCM tender to palpation, no cervical adenopathy, range of motion 5/5  Cardiovascular: Normal rate.   Pulmonary/Chest: Effort normal.  Abdominal: She exhibits no distension.  Musculoskeletal: Normal range of motion.  Neurological: She is alert and oriented to person, place, and time.  Skin: Skin is dry.  Psychiatric: She has a normal mood and affect. Her behavior is normal. Judgment and thought content normal.    ED Course  Procedures (including critical care time)  Labs Reviewed  RAPID STREP SCREEN   Results for orders placed during the hospital encounter of 10/08/12  RAPID STREP SCREEN      Result Value Range   Streptococcus, Group A Screen (Direct) NEGATIVE  NEGATIVE      1. Muscle strain   2. Sore throat   3. URI (upper respiratory infection)       MDM  Patient with sore throat and runny nose.  Likely due to allergies or URI. Will check rapid strep, and will reevaluate. No fevers, chills, nausea, vomiting or cough.  Patient also complains of muscle tenderness to the the left SCM.  She states that she slept funny. She thinks that she has a "kink" in her neck.  She has been trying to take percocet and flexeril with no relief.  Rapid strep is negative.  Suspect that the sore throat is due to  allergies/URI with as she has had associated runny nose.  Suspect muscle strain.  No neck stiffness, meningismus, or fevers.  No submandibular swelling.  No signs of Ludwig's.  Suspect MSK pain, as her left SCM is tender to palpation.  Will switch her to Robaxin and encourage her to ice the affected area.        Roxy Horseman, PA-C 10/08/12 [phone]

## 2012-10-08 NOTE — ED Notes (Signed)
The patient states that she has had a sore throat x 1 week.  Since then, the pain has gotten worse, and it has spread to her left side of her face and her left ear.

## 2012-10-08 NOTE — ED Provider Notes (Signed)
Medical screening examination/treatment/procedure(s) were performed by non-physician practitioner and as supervising physician I was immediately available for consultation/collaboration.   Angeliyah Kirkey B. Bernette Mayers, MD 10/08/12 2017

## 2012-10-08 NOTE — ED Notes (Signed)
PA at bedside.

## 2012-10-16 ENCOUNTER — Other Ambulatory Visit: Payer: Self-pay | Admitting: Diagnostic Neuroimaging

## 2013-02-01 ENCOUNTER — Other Ambulatory Visit: Payer: Self-pay | Admitting: Family Medicine

## 2013-02-01 DIAGNOSIS — R1013 Epigastric pain: Secondary | ICD-10-CM

## 2013-02-02 ENCOUNTER — Emergency Department (HOSPITAL_COMMUNITY): Payer: Medicaid Other

## 2013-02-02 ENCOUNTER — Encounter (HOSPITAL_COMMUNITY): Payer: Self-pay | Admitting: *Deleted

## 2013-02-02 ENCOUNTER — Emergency Department (HOSPITAL_COMMUNITY)
Admission: EM | Admit: 2013-02-02 | Discharge: 2013-02-02 | Disposition: A | Discharge status: Home / Self Care | Payer: Medicaid Other | Attending: Emergency Medicine | Admitting: Emergency Medicine

## 2013-02-02 DIAGNOSIS — S46909A Unspecified injury of unspecified muscle, fascia and tendon at shoulder and upper arm level, unspecified arm, initial encounter: Secondary | ICD-10-CM | POA: Insufficient documentation

## 2013-02-02 DIAGNOSIS — S161XXA Strain of muscle, fascia and tendon at neck level, initial encounter: Secondary | ICD-10-CM

## 2013-02-02 DIAGNOSIS — Y9389 Activity, other specified: Secondary | ICD-10-CM | POA: Insufficient documentation

## 2013-02-02 DIAGNOSIS — T07XXXA Unspecified multiple injuries, initial encounter: Secondary | ICD-10-CM | POA: Insufficient documentation

## 2013-02-02 DIAGNOSIS — G43909 Migraine, unspecified, not intractable, without status migrainosus: Secondary | ICD-10-CM | POA: Insufficient documentation

## 2013-02-02 DIAGNOSIS — M25552 Pain in left hip: Secondary | ICD-10-CM

## 2013-02-02 DIAGNOSIS — S79919A Unspecified injury of unspecified hip, initial encounter: Secondary | ICD-10-CM | POA: Insufficient documentation

## 2013-02-02 DIAGNOSIS — M25512 Pain in left shoulder: Secondary | ICD-10-CM

## 2013-02-02 DIAGNOSIS — J45909 Unspecified asthma, uncomplicated: Secondary | ICD-10-CM | POA: Insufficient documentation

## 2013-02-02 DIAGNOSIS — F17200 Nicotine dependence, unspecified, uncomplicated: Secondary | ICD-10-CM | POA: Insufficient documentation

## 2013-02-02 DIAGNOSIS — Z791 Long term (current) use of non-steroidal anti-inflammatories (NSAID): Secondary | ICD-10-CM | POA: Insufficient documentation

## 2013-02-02 DIAGNOSIS — Y92410 Unspecified street and highway as the place of occurrence of the external cause: Secondary | ICD-10-CM | POA: Insufficient documentation

## 2013-02-02 DIAGNOSIS — IMO0002 Reserved for concepts with insufficient information to code with codable children: Secondary | ICD-10-CM | POA: Insufficient documentation

## 2013-02-02 DIAGNOSIS — I10 Essential (primary) hypertension: Secondary | ICD-10-CM | POA: Insufficient documentation

## 2013-02-02 DIAGNOSIS — Z79899 Other long term (current) drug therapy: Secondary | ICD-10-CM | POA: Insufficient documentation

## 2013-02-02 DIAGNOSIS — Z862 Personal history of diseases of the blood and blood-forming organs and certain disorders involving the immune mechanism: Secondary | ICD-10-CM | POA: Insufficient documentation

## 2013-02-02 DIAGNOSIS — S4980XA Other specified injuries of shoulder and upper arm, unspecified arm, initial encounter: Secondary | ICD-10-CM | POA: Insufficient documentation

## 2013-02-02 DIAGNOSIS — S139XXA Sprain of joints and ligaments of unspecified parts of neck, initial encounter: Secondary | ICD-10-CM | POA: Insufficient documentation

## 2013-02-02 HISTORY — DX: Other chronic pain: G89.29

## 2013-02-02 HISTORY — DX: Cervicalgia: M54.2

## 2013-02-02 MED ORDER — KETOROLAC TROMETHAMINE 60 MG/2ML IM SOLN
60.0000 mg | Freq: Once | INTRAMUSCULAR | Status: AC
  Administered 2013-02-02: 60 mg via INTRAMUSCULAR
  Filled 2013-02-02: qty 2

## 2013-02-02 MED ORDER — DIAZEPAM 5 MG/ML IJ SOLN: 10.0000 mg | Freq: Once | INTRAMUSCULAR | Status: DC

## 2013-02-02 MED ORDER — METHOCARBAMOL 500 MG PO TABS: 500.0000 mg | ORAL_TABLET | Freq: Two times a day (BID) | ORAL | Status: DC

## 2013-02-02 MED ORDER — NAPROXEN 500 MG PO TABS: 500.0000 mg | ORAL_TABLET | Freq: Two times a day (BID) | ORAL | Status: DC

## 2013-02-02 MED ORDER — DIAZEPAM 5 MG/ML IJ SOLN
10.0000 mg | Freq: Once | INTRAMUSCULAR | Status: AC
  Administered 2013-02-02: 10 mg via INTRAMUSCULAR
  Filled 2013-02-02: qty 2
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
## 2013-02-03 NOTE — ED Provider Notes (Signed)
Medical screening examination/treatment/procedure(s) were performed by non-physician practitioner and as supervising physician I was immediately available for consultation/collaboration.   Dione Booze, MD 02/03/13 1504

## 2013-02-05 ENCOUNTER — Ambulatory Visit (HOSPITAL_COMMUNITY)
Admission: RE | Admit: 2013-02-05 | Discharge: 2013-02-05 | Disposition: A | Discharge status: Home / Self Care | Payer: Medicaid Other | Source: Ambulatory Visit | Attending: Family Medicine | Admitting: Family Medicine

## 2013-02-05 DIAGNOSIS — R1013 Epigastric pain: Secondary | ICD-10-CM

## 2013-02-05 DIAGNOSIS — R1310 Dysphagia, unspecified: Secondary | ICD-10-CM | POA: Insufficient documentation

## 2013-02-11 ENCOUNTER — Encounter: Payer: Self-pay | Admitting: *Deleted

## 2013-02-26 ENCOUNTER — Encounter (HOSPITAL_COMMUNITY): Payer: Self-pay | Admitting: Adult Health

## 2013-02-26 ENCOUNTER — Encounter (HOSPITAL_COMMUNITY): Payer: Self-pay | Admitting: *Deleted

## 2013-02-26 ENCOUNTER — Emergency Department (HOSPITAL_COMMUNITY): Payer: Medicaid Other

## 2013-02-26 ENCOUNTER — Emergency Department (HOSPITAL_COMMUNITY)
Admission: EM | Admit: 2013-02-26 | Discharge: 2013-02-26 | Disposition: A | Discharge status: Nursing Home (Includes ICF) | Payer: Medicaid Other | Source: Home / Self Care

## 2013-02-26 ENCOUNTER — Emergency Department (HOSPITAL_COMMUNITY)
Admission: EM | Admit: 2013-02-26 | Discharge: 2013-02-26 | Disposition: A | Discharge status: Home / Self Care | Payer: Medicaid Other | Attending: Emergency Medicine | Admitting: Emergency Medicine

## 2013-02-26 DIAGNOSIS — R1013 Epigastric pain: Secondary | ICD-10-CM

## 2013-02-26 DIAGNOSIS — Z862 Personal history of diseases of the blood and blood-forming organs and certain disorders involving the immune mechanism: Secondary | ICD-10-CM | POA: Insufficient documentation

## 2013-02-26 DIAGNOSIS — G43909 Migraine, unspecified, not intractable, without status migrainosus: Secondary | ICD-10-CM | POA: Insufficient documentation

## 2013-02-26 DIAGNOSIS — R112 Nausea with vomiting, unspecified: Secondary | ICD-10-CM | POA: Insufficient documentation

## 2013-02-26 DIAGNOSIS — M542 Cervicalgia: Secondary | ICD-10-CM | POA: Insufficient documentation

## 2013-02-26 DIAGNOSIS — G8929 Other chronic pain: Secondary | ICD-10-CM | POA: Insufficient documentation

## 2013-02-26 DIAGNOSIS — K219 Gastro-esophageal reflux disease without esophagitis: Secondary | ICD-10-CM | POA: Insufficient documentation

## 2013-02-26 DIAGNOSIS — Z79899 Other long term (current) drug therapy: Secondary | ICD-10-CM | POA: Insufficient documentation

## 2013-02-26 DIAGNOSIS — R1310 Dysphagia, unspecified: Secondary | ICD-10-CM

## 2013-02-26 DIAGNOSIS — Z9851 Tubal ligation status: Secondary | ICD-10-CM | POA: Insufficient documentation

## 2013-02-26 DIAGNOSIS — Z90710 Acquired absence of both cervix and uterus: Secondary | ICD-10-CM | POA: Insufficient documentation

## 2013-02-26 DIAGNOSIS — Z791 Long term (current) use of non-steroidal anti-inflammatories (NSAID): Secondary | ICD-10-CM | POA: Insufficient documentation

## 2013-02-26 DIAGNOSIS — F17200 Nicotine dependence, unspecified, uncomplicated: Secondary | ICD-10-CM | POA: Insufficient documentation

## 2013-02-26 DIAGNOSIS — R52 Pain, unspecified: Secondary | ICD-10-CM

## 2013-02-26 DIAGNOSIS — I10 Essential (primary) hypertension: Secondary | ICD-10-CM | POA: Insufficient documentation

## 2013-02-26 DIAGNOSIS — Z880 Allergy status to penicillin: Secondary | ICD-10-CM | POA: Insufficient documentation

## 2013-02-26 DIAGNOSIS — Z9889 Other specified postprocedural states: Secondary | ICD-10-CM | POA: Insufficient documentation

## 2013-02-26 DIAGNOSIS — R110 Nausea: Secondary | ICD-10-CM

## 2013-02-26 DIAGNOSIS — J45909 Unspecified asthma, uncomplicated: Secondary | ICD-10-CM | POA: Insufficient documentation

## 2013-02-26 HISTORY — DX: Gastro-esophageal reflux disease without esophagitis: K21.9

## 2013-02-26 LAB — CBC WITH DIFFERENTIAL/PLATELET
Basophils Absolute: 0 10*3/uL (ref 0.0–0.1)
Basophils Relative: 0 % (ref 0–1)
Eosinophils Absolute: 0.2 10*3/uL (ref 0.0–0.7)
Eosinophils Relative: 2 % (ref 0–5)
HCT: 34.7 % — ABNORMAL LOW (ref 36.0–46.0)
Hemoglobin: 11.6 g/dL — ABNORMAL LOW (ref 12.0–15.0)
Lymphocytes Relative: 21 % (ref 12–46)
Lymphs Abs: 2.3 10*3/uL (ref 0.7–4.0)
MCH: 23.5 pg — ABNORMAL LOW (ref 26.0–34.0)
MCHC: 33.4 g/dL (ref 30.0–36.0)
MCV: 70.4 fL — ABNORMAL LOW (ref 78.0–100.0)
Monocytes Absolute: 1.1 10*3/uL — ABNORMAL HIGH (ref 0.1–1.0)
Monocytes Relative: 10 % (ref 3–12)
Neutro Abs: 7.2 10*3/uL (ref 1.7–7.7)
Neutrophils Relative %: 67 % (ref 43–77)
Platelets: 306 10*3/uL (ref 150–400)
RBC: 4.93 MIL/uL (ref 3.87–5.11)
RDW: 15.8 % — ABNORMAL HIGH (ref 11.5–15.5)
WBC: 10.8 10*3/uL — ABNORMAL HIGH (ref 4.0–10.5)

## 2013-02-26 LAB — COMPREHENSIVE METABOLIC PANEL
ALT: 9 U/L (ref 0–35)
AST: 13 U/L (ref 0–37)
Albumin: 3.4 g/dL — ABNORMAL LOW (ref 3.5–5.2)
Alkaline Phosphatase: 81 U/L (ref 39–117)
BUN: 9 mg/dL (ref 6–23)
CO2: 31 mEq/L (ref 19–32)
Calcium: 9.4 mg/dL (ref 8.4–10.5)
Chloride: 98 mEq/L (ref 96–112)
Creatinine, Ser: 0.66 mg/dL (ref 0.50–1.10)
GFR calc Af Amer: >90 mL/min (ref >90)
GFR calc non Af Amer: >90 mL/min (ref >90)
Glucose, Bld: 90 mg/dL (ref 70–99)
Potassium: 3.7 mEq/L (ref 3.5–5.1)
Sodium: 138 mEq/L (ref 135–145)
Total Bilirubin: 0.3 mg/dL (ref 0.3–1.2)
Total Protein: 7.4 g/dL (ref 6.0–8.3)

## 2013-02-26 LAB — POCT I-STAT TROPONIN I: Troponin i, poc: 0 ng/mL (ref 0.00–0.08)

## 2013-02-26 LAB — LIPASE, BLOOD: Lipase: 16 U/L (ref 11–59)

## 2013-02-26 MED ORDER — LIDOCAINE VISCOUS 2 % MT SOLN
15.0000 mL | Freq: Once | OROMUCOSAL | Status: AC
  Administered 2013-02-26: 15 mL via OROMUCOSAL
  Filled 2013-02-26: qty 15

## 2013-02-26 MED ORDER — ALUM & MAG HYDROXIDE-SIMETH 200-200-20 MG/5ML PO SUSP
15.0000 mL | Freq: Once | ORAL | Status: AC
  Administered 2013-02-26: 15 mL via ORAL
  Filled 2013-02-26: qty 30

## 2013-02-26 MED ORDER — SODIUM CHLORIDE 0.9 % IV BOLUS (SEPSIS): 1000.0000 mL | Freq: Once | INTRAVENOUS | Status: DC

## 2013-02-26 NOTE — ED Notes (Signed)
Patient transported to X-ray and came back

## 2013-02-26 NOTE — ED Notes (Signed)
Dr. Gentry at the bedside.  

## 2013-02-26 NOTE — ED Notes (Signed)
Presents with 2 days of epigastric pain with radiation to back described as "something is stuck in there. It hurts worse when I yawn, take a deep breath or lay on my back. I have had my esophagus stretched before, but it does not feel anything like this. Everything I eat and drink comes back up" pain is associated with SOB and nausea. Pain is also described as sharp. Rated 9/10. Sent from [facility]

## 2013-02-26 NOTE — ED Provider Notes (Signed)
CSN: [id number]     Arrival date & time 02/26/13  1424 History   None    Chief Complaint  Patient presents with  . Abdominal Pain   (Consider location/radiation/quality/duration/timing/severity/associated sxs/prior Treatment) Patient is a 37 y.o. female presenting with abdominal pain. The history is provided by the patient.  Abdominal Pain This is a recurrent problem. The current episode started yesterday. The problem has been gradually worsening. Associated symptoms include abdominal pain.    Past Medical History  Diagnosis Date  . Asthma   . Hypertension   . Migraine   . Anemia   . Neck pain, chronic    Past Surgical History  Procedure Laterality Date  . Vaginal hysterectomy    . Tubal ligation     Family History  Problem Relation Age of Onset  . Heart disease Father   . Hypertension Mother    History  Substance Use Topics  . Smoking status: Current Some Day Smoker -- 0.10 packs/day for 10 years  . Smokeless tobacco: Never Used  . Alcohol Use: No   OB History   Grav Para Term Preterm Abortions TAB SAB Ect Mult Living   2 2  2            Review of Systems  Constitutional: Positive for appetite change.  Respiratory: Positive for chest tightness.   Cardiovascular: Negative for palpitations and leg swelling.  Gastrointestinal: Positive for abdominal pain. Negative for nausea, vomiting and diarrhea.    Allergies  Reglan; Amoxicillin; and Contrast media  Home Medications   Current Outpatient Rx  Name  Route  Sig  Dispense  Refill  . amLODipine (NORVASC) 5 MG tablet   Oral   Take 5 mg by mouth daily.         . hydrochlorothiazide (HYDRODIURIL) 25 MG tablet   Oral   Take 25 mg by mouth daily.         . methocarbamol (ROBAXIN) 500 MG tablet   Oral   Take 1 tablet (500 mg total) by mouth 2 (two) times daily.   20 tablet   0   . naproxen (NAPROSYN) 500 MG tablet   Oral   Take 1 tablet (500 mg total) by mouth 2 (two) times daily.   30 tablet   0    . oxyCODONE-acetaminophen (PERCOCET) 5-325 MG per tablet   Oral   Take 1 tablet by mouth 4 (four) times daily.          BP 126/87  Pulse 88  Temp(Src) 98.5 F (36.9 C) (Oral)  Resp 20  SpO2 100% Physical Exam  Nursing note and vitals reviewed. Constitutional: She is oriented to person, place, and time. She appears well-developed and well-nourished. She appears distressed.  HENT:  Head: Normocephalic.  Eyes: Conjunctivae and EOM are normal. Pupils are equal, round, and reactive to light.  Neck: Normal range of motion. Neck supple.  Abdominal: Soft. Normal appearance and bowel sounds are normal. She exhibits no distension and no mass. There is no hepatosplenomegaly. There is tenderness in the epigastric area. There is no rebound, no guarding and no CVA tenderness.  Musculoskeletal: She exhibits no edema.  Lymphadenopathy:    She has no cervical adenopathy.  Neurological: She is alert and oriented to person, place, and time.  Skin: Skin is warm and dry.    ED Course  Procedures (including critical care time) Labs Review Labs Reviewed - No data to display Imaging Review No results found.  MDM  Sent for gi eval  of prob esophagitis with h/o stricture,dilation, radiates to back., consider ct scan.    Linna Hoff, MD 02/26/13 [phone]

## 2013-02-26 NOTE — ED Notes (Signed)
Patient states that the medicine she recd by mouth did not help at all with her pain
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
## 2013-02-26 NOTE — ED Provider Notes (Signed)
CSN: [id number]     Arrival date & time 02/26/13  1618 History   First MD Initiated Contact with Patient 02/26/13 1914     Chief Complaint  Patient presents with  . Abdominal Pain   (Consider location/radiation/quality/duration/timing/severity/associated sxs/prior Treatment) Patient is a 37 y.o. female presenting with abdominal pain.  Abdominal Pain Pain location:  Epigastric Pain quality: aching and sharp   Pain radiates to:  Does not radiate Pain severity:  Moderate Onset quality:  Sudden Duration:  2 days Timing:  Constant Progression:  Worsening Chronicity:  Recurrent Context: previous surgery (previous dilatation of esophagus with narrowing, otherwise normal endoscopy)   Relieved by:  Position changes Worsened by:  Eating and position changes Associated symptoms: vomiting   Associated symptoms: no chest pain, no chills, no constipation, no cough, no diarrhea, no dysuria, no fever, no hematemesis, no hematochezia, no hematuria, no melena, no nausea, no shortness of breath, no sore throat, no vaginal bleeding and no vaginal discharge     Past Medical History  Diagnosis Date  . Asthma   . Hypertension   . Migraine   . Anemia   . Neck pain, chronic   . GERD (gastroesophageal reflux disease)    Past Surgical History  Procedure Laterality Date  . Vaginal hysterectomy    . Tubal ligation    . Esophageal dilation     Family History  Problem Relation Age of Onset  . Heart disease Father   . Hypertension Mother    History  Substance Use Topics  . Smoking status: Current Some Day Smoker -- 0.10 packs/day for 10 years  . Smokeless tobacco: Never Used  . Alcohol Use: No   OB History   Grav Para Term Preterm Abortions TAB SAB Ect Mult Living   2 2  2            Review of Systems  Constitutional: Negative for fever and chills.  HENT: Negative for congestion, sore throat and rhinorrhea.   Eyes: Negative for photophobia and visual disturbance.  Respiratory: Negative  for cough and shortness of breath.   Cardiovascular: Negative for chest pain and leg swelling.  Gastrointestinal: Positive for vomiting and abdominal pain. Negative for nausea, diarrhea, constipation, melena, hematochezia and hematemesis.  Endocrine: Negative for polyphagia and polyuria.  Genitourinary: Negative for dysuria, hematuria, flank pain, vaginal bleeding, vaginal discharge and enuresis.  Musculoskeletal: Negative for back pain and gait problem.  Skin: Negative for color change and rash.  Neurological: Negative for dizziness, syncope, light-headedness and numbness.  Hematological: Negative for adenopathy. Does not bruise/bleed easily.  All other systems reviewed and are negative.    Allergies  Reglan; Amoxicillin; and Contrast media  Home Medications   Current Outpatient Rx  Name  Route  Sig  Dispense  Refill  . amitriptyline (ELAVIL) 10 MG tablet   Oral   Take 10 mg by mouth at bedtime.         Marland Kitchen amLODipine (NORVASC) 5 MG tablet   Oral   Take 5 mg by mouth daily.         . hydrochlorothiazide (HYDRODIURIL) 25 MG tablet   Oral   Take 25 mg by mouth daily.         . methocarbamol (ROBAXIN) 500 MG tablet   Oral   Take 1 tablet (500 mg total) by mouth 2 (two) times daily.   20 tablet   0   . omeprazole (PRILOSEC) 40 MG capsule   Oral   Take 40 mg by  mouth daily.         Marland Kitchen oxyCODONE-acetaminophen (PERCOCET) 5-325 MG per tablet   Oral   Take 1 tablet by mouth 4 (four) times daily as needed for pain.          . naproxen (NAPROSYN) 500 MG tablet   Oral   Take 1 tablet (500 mg total) by mouth 2 (two) times daily.   30 tablet   0    BP 134/88  Pulse 95  Temp(Src) 97.9 F (36.6 C) (Oral)  Resp 18  SpO2 98% Physical Exam  Vitals reviewed. Constitutional: She is oriented to person, place, and time. She appears well-developed and well-nourished.  HENT:  Head: Normocephalic and atraumatic.  Right Ear: External ear normal.  Left Ear: External ear  normal.  Eyes: Conjunctivae and EOM are normal. Pupils are equal, round, and reactive to light.  Neck: Normal range of motion. Neck supple.  Cardiovascular: Normal rate, regular rhythm, normal heart sounds and intact distal pulses.   Pulmonary/Chest: Effort normal and breath sounds normal.  Abdominal: Soft. Bowel sounds are normal. There is tenderness in the epigastric area.  Musculoskeletal: Normal range of motion.  Neurological: She is alert and oriented to person, place, and time.  Skin: Skin is warm and dry.    ED Course  Procedures (including critical care time) Labs Review Labs Reviewed  CBC WITH DIFFERENTIAL - Abnormal; Notable for the following:    WBC 10.8 (*)    Hemoglobin 11.6 (*)    HCT 34.7 (*)    MCV 70.4 (*)    MCH 23.5 (*)    RDW 15.8 (*)    Monocytes Absolute 1.1 (*)    All other components within normal limits  COMPREHENSIVE METABOLIC PANEL - Abnormal; Notable for the following:    Albumin 3.4 (*)    All other components within normal limits  LIPASE, BLOOD  POCT I-STAT TROPONIN I   Imaging Review Dg Chest 2 View  02/26/2013   CLINICAL DATA:  Chest pain.  EXAM: CHEST  2 VIEW  COMPARISON:  11/14/2011.  FINDINGS: The heart size and mediastinal contours are within normal limits. Both lungs are clear. The visualized skeletal structures are unremarkable.  IMPRESSION: No active cardiopulmonary disease.   Electronically Signed   By: Loralie Champagne M.D.   On: 02/26/2013 20:18    Date: 02/27/2013  Rate: 91  Rhythm: normal sinus rhythm  QRS Axis: normal  Intervals: Normal  ST/T Wave abnormalities: normal  Conduction Disutrbances:none  Narrative Interpretation: NSR without findigns  Old EKG Reviewed: none available    MDM   1. Epigastric abdominal pain   2. Nausea   3. Dysphagia    37 y.o. female  with pertinent PMH of esophageal narrowing, followed by GI with prior dilatation presents with symptoms of foreign body in esophagus, pain in epigastrium  exacerbated by eating and drinking similar to prior episodes of esophageal pain.  Pt does state that this time is more severe than her previous.  Physical exam as above.  Labs and imaging demonstrated no acute findings.  Pt tolerates PO liquids.  Symptoms unchanged by maalox and viscous lidocaine.  Spoke with GI, who will help arrange for fu for endoscopy.  I think likely etiology of pt symptoms is PUD vs erosive esophagitis given chronic ho gerd and similar symptoms.  Pt given strict return precautions, voiced understanding, and agreed to fu.  Labs and imaging as above reviewed by myself and attending,Dr. Rosalia Hammers, with whom case was discussed.  1. Epigastric abdominal pain   2. Nausea   3. Dysphagia         Noel Gerold, MD 02/27/13 [phone]

## 2013-02-26 NOTE — ED Notes (Signed)
MD at bedside. 

## 2013-02-26 NOTE — ED Notes (Signed)
Patient states she is still hurting and is upset about being here since 230pm and still having the same pain.   Dr. Littie Deeds aware.

## 2013-02-26 NOTE — ED Notes (Signed)
Patient asked why she needed an IV.  Spoke with Dr. Littie Deeds and was told we could hold off on the IV

## 2013-02-26 NOTE — ED Notes (Signed)
Epigastric   Pain   X  2  Days           With  Symptoms  Radiating  To  Back   With              Pain  When  She  Takes  A  Deep  Breath           She  Reports  Feels  As though         It is  Difficult  To  Swallow          She  Has  History of  gerd   In  Past

## 2013-02-27 ENCOUNTER — Ambulatory Visit (INDEPENDENT_AMBULATORY_CARE_PROVIDER_SITE_OTHER): Payer: Medicaid Other | Admitting: Obstetrics & Gynecology

## 2013-02-27 ENCOUNTER — Encounter: Payer: Self-pay | Admitting: Obstetrics & Gynecology

## 2013-02-27 ENCOUNTER — Telehealth: Payer: Self-pay | Admitting: Gastroenterology

## 2013-02-27 ENCOUNTER — Other Ambulatory Visit: Payer: Self-pay

## 2013-02-27 VITALS — BP 113/79 | HR 96 | Temp 98.1°F | Ht 64.0 in | Wt 199.9 lb

## 2013-02-27 DIAGNOSIS — R1310 Dysphagia, unspecified: Secondary | ICD-10-CM

## 2013-02-27 DIAGNOSIS — N9489 Other specified conditions associated with female genital organs and menstrual cycle: Secondary | ICD-10-CM

## 2013-02-27 DIAGNOSIS — N949 Unspecified condition associated with female genital organs and menstrual cycle: Secondary | ICD-10-CM

## 2013-02-27 DIAGNOSIS — R102 Pelvic and perineal pain: Secondary | ICD-10-CM

## 2013-02-27 LAB — POCT URINALYSIS DIP (DEVICE)
Glucose, UA: NEGATIVE mg/dL
Hgb urine dipstick: NEGATIVE
Ketones, ur: NEGATIVE mg/dL
Leukocytes, UA: NEGATIVE
Nitrite: NEGATIVE
Protein, ur: 30 mg/dL — AB
Specific Gravity, Urine: >=1.03 (ref 1.005–1.030)
Urobilinogen, UA: 1 mg/dL (ref 0.0–1.0)
pH: 6 (ref 5.0–8.0)

## 2013-02-27 NOTE — Progress Notes (Signed)
Pt. Reports lower abdomen/pelvic pain, describes it as bad cramps and continuous for days, especially after sex. Pain has been ongoing for two years; pt. Reports having an ultrasound 2 years ago which should a small fibroid and nothing was done about it at that time. Pt. States every time she has been to the doctors in the past she has had BV, states she does not have discharge, burning sensation or urinary frequency but does report pressure after urinating; UA obtained.

## 2013-02-27 NOTE — Progress Notes (Signed)
Subjective:     Patient ID: Suzanne [name] Check, female   DOB: 06/12/1975, 37 y.o.   MRN: [id number]  HPI Pt c/o 2 years of pelvic pain.  Sx are worse after intercourse.  Sx after intercourse are bad for 2-3 weeks.  Pt reports that she has bowel movements 2-3 times per week.   She reports that the pain is intermittent but, it is becoming worse.  Other than intercourse she does not know what makes it worse. She thinks that she was told several years ago that she had a cyst on her ovary.  Past Medical History  Diagnosis Date  . Asthma   . Hypertension   . Migraine   . Anemia   . Neck pain, chronic   . GERD (gastroesophageal reflux disease)   . Chronic lower back pain    Past Surgical History  Procedure Laterality Date  . Vaginal hysterectomy    . Tubal ligation    . Esophageal dilation     Allergies  Allergen Reactions  . Reglan [Metoclopramide] Shortness Of Breath  . Amoxicillin Rash  . Contrast Media [Iodinated Diagnostic Agents] Rash   Current Outpatient Prescriptions on File Prior to Visit  Medication Sig Dispense Refill  . amitriptyline (ELAVIL) 10 MG tablet Take 10 mg by mouth at bedtime.      . amLODipine (NORVASC) 5 MG tablet Take 5 mg by mouth daily.      . hydrochlorothiazide (HYDRODIURIL) 25 MG tablet Take 25 mg by mouth daily.      . methocarbamol (ROBAXIN) 500 MG tablet Take 1 tablet (500 mg total) by mouth 2 (two) times daily.  20 tablet  0  . naproxen (NAPROSYN) 500 MG tablet Take 1 tablet (500 mg total) by mouth 2 (two) times daily.  30 tablet  0  . omeprazole (PRILOSEC) 40 MG capsule Take 40 mg by mouth daily.      . oxyCODONE-acetaminophen (PERCOCET) 5-325 MG per tablet Take 1 tablet by mouth 4 (four) times daily as needed for pain.        No current facility-administered medications on file prior to visit.   History   Social History  . Marital Status: Married    Spouse Name: N/A    Number of Children: 2  . Years of Education: N/A   Occupational History  .  Homemaker    Social History Main Topics  . Smoking status: Current Some Day Smoker -- 0.10 packs/day for 10 years    Types: Cigarettes  . Smokeless tobacco: Never Used  . Alcohol Use: No  . Drug Use: No  . Sexual Activity: Yes    Birth Control/ Protection: Other-see comments   Other Topics Concern  . Not on file   Social History Narrative  . No narrative on file  Pt smokes black and mild 1 per day      Review of Systems     Objective:   Physical Exam BP 113/79  Pulse 96  Temp(Src) 98.1 F (36.7 C)  Ht 5' 4" (1.626 m)  Wt 199 lb 14.4 oz (90.674 kg)  BMI 34.3 kg/m2 Pt in NAD Lungs: CTA CV: RRR Abd: obese, NT, ND. No incisions noted  GU: EGBUS: no lesions Cervix/Uterus: surgically absent Adnexa: no masses; sl tender on left side         Assessment:     Pelvic pain- unsure if sx are  Related to constipation vs scar tissue.  It does not appear to be cyclical in nature.    Will order sono to check for ovarian cysts    Plan:     Miralax 2 capfuls daily until having BM's daily Pelvic sono F/u 2 months or sooner prn       

## 2013-02-27 NOTE — Patient Instructions (Addendum)
Constipation, Adult Constipation is when a person has fewer than 3 bowel movements a week; has difficulty having a bowel movement; or has stools that are dry, hard, or larger than normal. As people grow older, constipation is more common. If you try to fix constipation with medicines that make you have a bowel movement (laxatives), the problem may get worse. Long-term laxative use may cause the muscles of the colon to become weak. A low-fiber diet, not taking in enough fluids, and taking certain medicines may make constipation worse. CAUSES   Certain medicines, such as antidepressants, pain medicine, iron supplements, antacids, and water pills.   Certain diseases, such as diabetes, irritable bowel syndrome (IBS), thyroid disease, or depression.   Not drinking enough water.   Not eating enough fiber-rich foods.   Stress or travel.  Lack of physical activity or exercise.  Not going to the restroom when there is the urge to have a bowel movement.  Ignoring the urge to have a bowel movement.  Using laxatives too much. SYMPTOMS   Having fewer than 3 bowel movements a week.   Straining to have a bowel movement.   Having hard, dry, or larger than normal stools.   Feeling full or bloated.   Pain in the lower abdomen.  Not feeling relief after having a bowel movement. DIAGNOSIS  Your caregiver will take a medical history and perform a physical exam. Further testing may be done for severe constipation. Some tests may include:   A barium enema X-ray to examine your rectum, colon, and sometimes, your small intestine.  A sigmoidoscopy to examine your lower colon.  A colonoscopy to examine your entire colon. TREATMENT  Treatment will depend on the severity of your constipation and what is causing it. Some dietary treatments include drinking more fluids and eating more fiber-rich foods. Lifestyle treatments may include regular exercise. If these diet and lifestyle recommendations  do not help, your caregiver may recommend taking over-the-counter laxative medicines to help you have bowel movements. Prescription medicines may be prescribed if over-the-counter medicines do not work.  HOME CARE INSTRUCTIONS   Increase dietary fiber in your diet, such as fruits, vegetables, whole grains, and beans. Limit high-fat and processed sugars in your diet, such as [place] fries, hamburgers, cookies, candies, and soda.   A fiber supplement may be added to your diet if you cannot get enough fiber from foods.   Drink enough fluids to keep your urine clear or pale yellow.   Exercise regularly or as directed by your caregiver.   Go to the restroom when you have the urge to go. Do not hold it.  Only take medicines as directed by your caregiver. Do not take other medicines for constipation without talking to your caregiver first. SEEK IMMEDIATE MEDICAL CARE IF:   You have bright red blood in your stool.   Your constipation lasts for more than 4 days or gets worse.   You have abdominal or rectal pain.   You have thin, pencil-like stools.  You have unexplained weight loss. MAKE SURE YOU:   Understand these instructions.  Will watch your condition.  Will get help right away if you are not doing well or get worse. Document Released: 02/12/2004 Document Revised: 08/08/2011 Document Reviewed: 04/19/2011 [facility] Patient Information 2014 [place], [place]. Pelvic Pain, Female Female pelvic pain can be caused by many different things and start from a variety of places. Pelvic pain refers to pain that is located in the lower half of the abdomen and  between your hips. The pain may occur over a short period of time (acute) or may be reoccurring (chronic). The cause of pelvic pain may be related to disorders affecting the female reproductive organs (gynecologic), but it may also be related to the bladder, kidney stones, an intestinal complication, or muscle or skeletal problems. Getting  help right away for pelvic pain is important, especially if there has been severe, sharp, or a sudden onset of unusual pain. It is also important to get help right away because some types of pelvic pain can be life threatening.  CAUSES  Below are only some of the causes of pelvic pain. The causes of pelvic pain can be in one of several categories.   Gynecologic.  Pelvic inflammatory disease.  Sexually transmitted infection.  Ovarian cyst or a twisted ovarian ligament (ovarian torsion).  Uterine lining that grows outside the uterus (endometriosis).  Fibroids, cysts, or tumors.  Ovulation.  Pregnancy.  Pregnancy that occurs outside the uterus (ectopic pregnancy).  Miscarriage.  Labor.  Abruption of the placenta or ruptured uterus.  Infection.  Uterine infection (endometritis).  Bladder infection.  Diverticulitis.  Miscarriage related to a uterine infection (septic abortion).  Bladder.  Inflammation of the bladder (cystitis).  Kidney stone(s).  Gastrointenstinal.  Constipation.  Diverticulitis.  Neurologic.  Trauma.  Feeling pelvic pain because of mental or emotional causes (psychosomatic).  Cancers of the bowel or pelvis. EVALUATION  Your caregiver will want to take a careful history of your concerns. This includes recent changes in your health, a careful gynecologic history of your periods (menses), and a sexual history. Obtaining your family history and medical history is also important. Your caregiver may suggest a pelvic exam. A pelvic exam will help identify the location and severity of the pain. It also helps in the evaluation of which organ system may be involved. In order to identify the cause of the pelvic pain and be properly treated, your caregiver may order tests. These tests may include:   A pregnancy test.  Pelvic ultrasonography.  An X-ray exam of the abdomen.  A urinalysis or evaluation of vaginal discharge.  Blood tests. HOME CARE  INSTRUCTIONS   Only take over-the-counter or prescription medicines for pain, discomfort, or fever as directed by your caregiver.   Rest as directed by your caregiver.   Eat a balanced diet.   Drink enough fluids to make your urine clear or pale yellow, or as directed.   Avoid sexual intercourse if it causes pain.   Apply warm or cold compresses to the lower abdomen depending on which one helps the pain.   Avoid stressful situations.   Keep a journal of your pelvic pain. Write down when it started, where the pain is located, and if there are things that seem to be associated with the pain, such as food or your menstrual cycle.  Follow up with your caregiver as directed.  SEEK MEDICAL CARE IF:  Your medicine does not help your pain.  You have abnormal vaginal discharge. SEEK IMMEDIATE MEDICAL CARE IF:   You have heavy bleeding from the vagina.   Your pelvic pain increases.   You feel lightheaded or faint.   You have chills.   You have pain with urination or blood in your urine.   You have uncontrolled diarrhea or vomiting.   You have a fever or persistent symptoms for more than 3 days.  You have a fever and your symptoms suddenly get worse.   You are being physically or  sexually abused.  MAKE SURE YOU:  Understand these instructions.  Will watch your condition.  Will get help if you are not doing well or get worse. Document Released: 04/12/2004 Document Revised: 11/15/2011 Document Reviewed: 09/05/2011 [facility] Patient Information 2014 [place], [place].  **Miralax 2 capfuls daily until having daily bowel movements.  May need to increase or decrease by 1 capful daily every 2-3 days until  Having daily bowel movements**.

## 2013-02-27 NOTE — Telephone Encounter (Signed)
Needs EGD with likely dilation, MAC sedation ([facility] or [facility] whichever is sooner appt).  Should chew food well, eat slowly and take small bites in the meantime.

## 2013-02-27 NOTE — Telephone Encounter (Signed)
Pt has been scheduled for a EGD and has been instructed

## 2013-02-27 NOTE — Telephone Encounter (Signed)
Does pt need to be seen or can she be scheduled for an EGD?  Mild narrowing at GE junction on past EGD 11/2011.

## 2013-02-27 NOTE — Progress Notes (Signed)
02-27-13 1655 spoke with patient. Stated she talked with a RN from MD office was given arrival time of 11:30 am, take Blood pressure med. RN covered medical history and all instructions. Suzanne [name]

## 2013-02-28 ENCOUNTER — Encounter (HOSPITAL_COMMUNITY): Payer: Self-pay | Admitting: Anesthesiology

## 2013-02-28 ENCOUNTER — Ambulatory Visit (HOSPITAL_COMMUNITY): Payer: Medicaid Other | Admitting: Anesthesiology

## 2013-02-28 ENCOUNTER — Ambulatory Visit (HOSPITAL_COMMUNITY)
Admission: RE | Admit: 2013-02-28 | Discharge: 2013-02-28 | Disposition: A | Discharge status: Home / Self Care | Payer: Medicaid Other | Source: Ambulatory Visit | Attending: Gastroenterology | Admitting: Gastroenterology

## 2013-02-28 ENCOUNTER — Encounter (HOSPITAL_COMMUNITY)
Admission: RE | Disposition: A | Discharge status: Home / Self Care | Payer: Self-pay | Source: Ambulatory Visit | Attending: Gastroenterology

## 2013-02-28 ENCOUNTER — Encounter (HOSPITAL_COMMUNITY): Payer: Self-pay | Admitting: *Deleted

## 2013-02-28 DIAGNOSIS — J45909 Unspecified asthma, uncomplicated: Secondary | ICD-10-CM | POA: Insufficient documentation

## 2013-02-28 DIAGNOSIS — K2289 Other specified disease of esophagus: Secondary | ICD-10-CM | POA: Insufficient documentation

## 2013-02-28 DIAGNOSIS — N949 Unspecified condition associated with female genital organs and menstrual cycle: Secondary | ICD-10-CM | POA: Insufficient documentation

## 2013-02-28 DIAGNOSIS — K222 Esophageal obstruction: Secondary | ICD-10-CM

## 2013-02-28 DIAGNOSIS — K219 Gastro-esophageal reflux disease without esophagitis: Secondary | ICD-10-CM | POA: Insufficient documentation

## 2013-02-28 DIAGNOSIS — K228 Other specified diseases of esophagus: Secondary | ICD-10-CM | POA: Insufficient documentation

## 2013-02-28 DIAGNOSIS — R1310 Dysphagia, unspecified: Secondary | ICD-10-CM

## 2013-02-28 DIAGNOSIS — I10 Essential (primary) hypertension: Secondary | ICD-10-CM | POA: Insufficient documentation

## 2013-02-28 HISTORY — PX: ESOPHAGOGASTRODUODENOSCOPY (EGD) WITH ESOPHAGEAL DILATION: SHX5812

## 2013-02-28 SURGERY — ESOPHAGOGASTRODUODENOSCOPY (EGD) WITH ESOPHAGEAL DILATION
Anesthesia: Monitor Anesthesia Care

## 2013-02-28 MED ORDER — KETAMINE HCL 50 MG/ML IJ SOLN
INTRAMUSCULAR | Status: DC | PRN
  Administered 2013-02-28 (×2): 25 mg via INTRAMUSCULAR

## 2013-02-28 MED ORDER — PROPOFOL INFUSION 10 MG/ML OPTIME
INTRAVENOUS | Status: DC | PRN
  Administered 2013-02-28: 120 ug/kg/min via INTRAVENOUS

## 2013-02-28 MED ORDER — BUTAMBEN-TETRACAINE-BENZOCAINE 2-2-14 % EX AERO
INHALATION_SPRAY | CUTANEOUS | Status: DC | PRN
  Administered 2013-02-28: 2 via TOPICAL

## 2013-02-28 MED ORDER — SODIUM CHLORIDE 0.9 % IV SOLN: INTRAVENOUS | Status: DC

## 2013-02-28 MED ORDER — LACTATED RINGERS IV SOLN
INTRAVENOUS | Status: DC
  Administered 2013-02-28: 1000 mL via INTRAVENOUS

## 2013-02-28 NOTE — Anesthesia Preprocedure Evaluation (Signed)
Anesthesia Evaluation  Patient identified by MRN, date of birth, ID band Patient awake    Reviewed: Allergy & Precautions, H&P , NPO status , Patient's Chart, lab work & pertinent test results  Airway Mallampati: II TM Distance: <3 FB Neck ROM: Full    Dental no notable dental hx.    Pulmonary neg pulmonary ROS, Current Smoker,  breath sounds clear to auscultation  Pulmonary exam normal       Cardiovascular hypertension, Pt. on medications Rhythm:Regular Rate:Normal     Neuro/Psych negative neurological ROS  negative psych ROS   GI/Hepatic Neg liver ROS, GERD-  Medicated,  Endo/Other  negative endocrine ROS  Renal/GU negative Renal ROS  negative genitourinary   Musculoskeletal negative musculoskeletal ROS (+)   Abdominal   Peds negative pediatric ROS (+)  Hematology negative hematology ROS (+)   Anesthesia Other Findings   Reproductive/Obstetrics negative OB ROS                           Anesthesia Physical  Anesthesia Plan  ASA: II  Anesthesia Plan: MAC   Post-op Pain Management:    Induction: Intravenous  Airway Management Planned: Nasal Cannula  Additional Equipment:   Intra-op Plan:   Post-operative Plan:   Informed Consent: I have reviewed the patients History and Physical, chart, labs and discussed the procedure including the risks, benefits and alternatives for the proposed anesthesia with the patient or authorized representative who has indicated his/her understanding and acceptance.   Dental advisory given  Plan Discussed with: CRNA and Surgeon  Anesthesia Plan Comments:         Anesthesia Quick Evaluation

## 2013-02-28 NOTE — H&P (View-Only) (Signed)
Subjective:     Patient ID: Suzanne [name], female   DOB: 05-04-1976, 37 y.o.   MRN: [id number]  HPI Pt c/o 2 years of pelvic pain.  Sx are worse after intercourse.  Sx after intercourse are bad for 2-3 weeks.  Pt reports that she has bowel movements 2-3 times per week.   She reports that the pain is intermittent but, it is becoming worse.  Other than intercourse she does not know what makes it worse. She thinks that she was told several years ago that she had a cyst on her ovary.  Past Medical History  Diagnosis Date  . Asthma   . Hypertension   . Migraine   . Anemia   . Neck pain, chronic   . GERD (gastroesophageal reflux disease)   . Chronic lower back pain    Past Surgical History  Procedure Laterality Date  . Vaginal hysterectomy    . Tubal ligation    . Esophageal dilation     Allergies  Allergen Reactions  . Reglan [Metoclopramide] Shortness Of Breath  . Amoxicillin Rash  . Contrast Media [Iodinated Diagnostic Agents] Rash   Current Outpatient Prescriptions on File Prior to Visit  Medication Sig Dispense Refill  . amitriptyline (ELAVIL) 10 MG tablet Take 10 mg by mouth at bedtime.      Marland Kitchen amLODipine (NORVASC) 5 MG tablet Take 5 mg by mouth daily.      . hydrochlorothiazide (HYDRODIURIL) 25 MG tablet Take 25 mg by mouth daily.      . methocarbamol (ROBAXIN) 500 MG tablet Take 1 tablet (500 mg total) by mouth 2 (two) times daily.  20 tablet  0  . naproxen (NAPROSYN) 500 MG tablet Take 1 tablet (500 mg total) by mouth 2 (two) times daily.  30 tablet  0  . omeprazole (PRILOSEC) 40 MG capsule Take 40 mg by mouth daily.      Marland Kitchen oxyCODONE-acetaminophen (PERCOCET) 5-325 MG per tablet Take 1 tablet by mouth 4 (four) times daily as needed for pain.        No current facility-administered medications on file prior to visit.   History   Social History  . Marital Status: Married    Spouse Name: N/A    Number of Children: 2  . Years of Education: N/A   Occupational History  .  Homemaker    Social History Main Topics  . Smoking status: Current Some Day Smoker -- 0.10 packs/day for 10 years    Types: Cigarettes  . Smokeless tobacco: Never Used  . Alcohol Use: No  . Drug Use: No  . Sexual Activity: Yes    Birth Control/ Protection: Other-see comments   Other Topics Concern  . Not on file   Social History Narrative  . No narrative on file  Pt smokes black and mild 1 per day      Review of Systems     Objective:   Physical Exam BP 113/79  Pulse 96  Temp(Src) 98.1 F (36.7 C)  Ht 5\' 4"  (1.626 m)  Wt 199 lb 14.4 oz (90.674 kg)  BMI 34.3 kg/m2 Pt in NAD Lungs: CTA CV: RRR Abd: obese, NT, ND. No incisions noted  GU: EGBUS: no lesions Cervix/Uterus: surgically absent Adnexa: no masses; sl tender on left side         Assessment:     Pelvic pain- unsure if sx are  Related to constipation vs scar tissue.  It does not appear to be cyclical in nature.  Will order sono to check for ovarian cysts    Plan:     Miralax 2 capfuls daily until having BM's daily Pelvic sono F/u 2 months or sooner prn

## 2013-02-28 NOTE — Interval H&P Note (Signed)
History and Physical Interval Note:  02/28/2013 12:00 PM  Suzanne [name]  has presented today for surgery, with the diagnosis of Dysphagia [787.20]  The various methods of treatment have been discussed with the patient and family. After consideration of risks, benefits and other options for treatment, the patient has consented to  Procedure(s): ESOPHAGOGASTRODUODENOSCOPY (EGD) WITH ESOPHAGEAL DILATION (N/A) as a surgical intervention .  The patient's history has been reviewed, patient examined, no change in status, stable for surgery.  I have reviewed the patient's chart and labs.  Questions were answered to the patient's satisfaction.     Rachael Fee

## 2013-02-28 NOTE — Transfer of Care (Signed)
Immediate Anesthesia Transfer of Care Note  Patient: Suzanne [name]  Procedure(s) Performed: Procedure(s) (LRB): ESOPHAGOGASTRODUODENOSCOPY (EGD) WITH ESOPHAGEAL DILATION (N/A)  Patient Location: PACU  Anesthesia Type: MAC  Level of Consciousness: sedated, patient cooperative and responds to stimulation  Airway & Oxygen Therapy: Patient Spontanous Breathing and Patient connected to face mask oxgen  Post-op Assessment: Report given to PACU RN and Post -op Vital signs reviewed and stable  Post vital signs: Reviewed and stable  Complications: No apparent anesthesia complications

## 2013-02-28 NOTE — Op Note (Signed)
[facility] [address] [place], [address]   ENDOSCOPY PROCEDURE REPORT  PATIENT: Suzanne [name], Suzanne [name]  MR#: [id number] BIRTHDATE: 1976-01-24 , 37  yrs. old GENDER: Female ENDOSCOPIST: Rachael Fee, MD PROCEDURE DATE:  02/28/2013 PROCEDURE:  EGD, balloon dilation ASA CLASS:     Class II INDICATIONS:  dysphagia; had EGD with 20mm balloon dilation 11/2011 for dysphagia, mild GE junction narrowing with improvement of her symptoms after dilation. MEDICATIONS: MAC sedation, administered by CRNA TOPICAL ANESTHETIC: Cetacaine Spray  DESCRIPTION OF PROCEDURE: After the risks benefits and alternatives of the procedure were thoroughly explained, informed consent was obtained.  The PENTAX GASTOROSCOPE [id number] endoscope was introduced through the mouth and advanced to the second portion of the duodenum. Without limitations.  The instrument was slowly withdrawn as the mucosa was fully examined.    There GE junction was slightly narrowed with normal mucosa.  This did not limit scope passage.  Given her improvement with dilation in 2013, I repeated balloon dilation with CRE TTS balloon held inflated for 60 secods.  The examination was otherwise normal. Retroflexed views revealed no abnormalities.     The scope was then withdrawn from the patient and the procedure completed. COMPLICATIONS: There were no complications.  ENDOSCOPIC IMPRESSION: There GE junction was slightly narrowed with normal mucosa.  This did not limit scope passage.  Given her improvement with dilation in 2013, I repeated balloon dilation with CRE TTS balloon held inflated for 60 secods.  The examination was otherwise normal.  RECOMMENDATIONS: Please call Dr.  Christella Hartigan' office in 4-5 weeks to report on your symptoms.  If no signficant improvement in your swallowing, then the next step is Esophageal Manometry testing to check for underlying motility disorder.    eSigned:  Rachael Fee, MD  02/28/2013 1:16 PM

## 2013-03-01 ENCOUNTER — Encounter (HOSPITAL_COMMUNITY): Payer: Self-pay | Admitting: Gastroenterology

## 2013-03-01 NOTE — ED Provider Notes (Signed)
History/physical exam/procedure(s) were performed by non-physician practitioner and as supervising physician I was immediately available for consultation/collaboration. I have reviewed all notes and am in agreement with care and plan.   Milton Sagona S Faylynn Stamos, MD 03/01/13 1919 

## 2013-03-01 NOTE — Anesthesia Postprocedure Evaluation (Signed)
  Anesthesia Post-op Note  Patient: Suzanne [name]  Procedure(s) Performed: Procedure(s) (LRB): ESOPHAGOGASTRODUODENOSCOPY (EGD) WITH ESOPHAGEAL DILATION (N/A)  Patient Location: PACU  Anesthesia Type: MAC  Level of Consciousness: awake and alert   Airway and Oxygen Therapy: Patient Spontanous Breathing  Post-op Pain: mild  Post-op Assessment: Post-op Vital signs reviewed, Patient's Cardiovascular Status Stable, Respiratory Function Stable, Patent Airway and No signs of Nausea or vomiting  Last Vitals:  Filed Vitals:   02/28/13 1410  BP: 148/87  Temp:   Resp: 14    Post-op Vital Signs: stable   Complications: No apparent anesthesia complications

## 2013-03-05 ENCOUNTER — Ambulatory Visit (HOSPITAL_COMMUNITY)
Admission: RE | Admit: 2013-03-05 | Discharge: 2013-03-05 | Disposition: A | Discharge status: Home / Self Care | Payer: Medicaid Other | Source: Ambulatory Visit | Attending: Obstetrics & Gynecology | Admitting: Obstetrics & Gynecology

## 2013-03-05 DIAGNOSIS — R102 Pelvic and perineal pain: Secondary | ICD-10-CM

## 2013-03-05 DIAGNOSIS — N949 Unspecified condition associated with female genital organs and menstrual cycle: Secondary | ICD-10-CM | POA: Insufficient documentation

## 2013-03-05 DIAGNOSIS — Z90710 Acquired absence of both cervix and uterus: Secondary | ICD-10-CM | POA: Insufficient documentation

## 2013-03-05 DIAGNOSIS — N83209 Unspecified ovarian cyst, unspecified side: Secondary | ICD-10-CM | POA: Diagnosis not present

## 2013-03-19 ENCOUNTER — Encounter: Payer: Self-pay | Admitting: Obstetrics

## 2013-03-25 ENCOUNTER — Ambulatory Visit (INDEPENDENT_AMBULATORY_CARE_PROVIDER_SITE_OTHER): Payer: Medicaid Other | Admitting: Gastroenterology

## 2013-03-25 ENCOUNTER — Encounter: Payer: Self-pay | Admitting: Gastroenterology

## 2013-03-25 VITALS — BP 172/104 | HR 64 | Ht 64.0 in | Wt 203.8 lb

## 2013-03-25 DIAGNOSIS — R0789 Other chest pain: Secondary | ICD-10-CM

## 2013-03-25 DIAGNOSIS — R1310 Dysphagia, unspecified: Secondary | ICD-10-CM

## 2013-03-25 NOTE — Progress Notes (Signed)
Review of pertinent gastrointestinal problems: 1. dysphagia; had EGD with 20mm balloon dilation 11/2011 for dysphagia, mild GE junction narrowing with improvement of her symptoms after dilation.  02/2013 abd pain to er, also dysphagia. Repeat EGD 02/2013 Christella Hartigan again found slightly narrow, smooth GE junction, dilated to 20mm.  ? Need for achalasia workup after EGD. 2. Abd, epig pain 2014, to ER: [place] of abd was normal; cbc slightly elevated WBC, normal CMET.  HPI: This is a very pleasant 37 year old woman whom I last saw about one month ago at the time of an EGD. Her dysphagia improved for about 2 weeks after the esophageal dilation. Dysphasia has returned however. She describes it as solid food only. She left the pure her food to get it down well. She is also the same time explaining some chest discomforts when she eats these might be upper epigastrium. Pains can start within about a half hour of eating. Pains can radiate to her back.    Past Medical History  Diagnosis Date  . Asthma   . Hypertension   . Migraine   . Anemia   . Neck pain, chronic   . GERD (gastroesophageal reflux disease)   . Chronic lower back pain     Past Surgical History  Procedure Laterality Date  . Vaginal hysterectomy    . Tubal ligation    . Esophageal dilation    . Esophagogastroduodenoscopy (egd) with esophageal dilation N/A 02/28/2013    Procedure: ESOPHAGOGASTRODUODENOSCOPY (EGD) WITH ESOPHAGEAL DILATION;  Surgeon: Rachael Fee, MD;  Location: [facility] END[facility];  Service: Endoscopy;  Laterality: N/A;    Current Outpatient Prescriptions  Medication Sig Dispense Refill  . amitriptyline (ELAVIL) 10 MG tablet Take 10 mg by mouth at bedtime.      . gabapentin (NEURONTIN) 300 MG capsule Take 300 mg by mouth 3 (three) times daily.      . hydrochlorothiazide (HYDRODIURIL) 25 MG tablet Take 25 mg by mouth daily.      . metoprolol succinate (TOPROL-XL) 100 MG 24 hr tablet Take 100 mg by mouth daily. Take with or  immediately following a meal.      . omeprazole (PRILOSEC) 40 MG capsule Take 40 mg by mouth daily.      Marland Kitchen oxyCODONE-acetaminophen (PERCOCET) 5-325 MG per tablet Take 1 tablet by mouth 4 (four) times daily as needed for pain.        No current facility-administered medications for this visit.    Allergies as of 03/25/2013 - Review Complete 03/25/2013  Allergen Reaction Noted  . Reglan [metoclopramide] Shortness Of Breath 11/14/2011  . Amoxicillin Rash 11/14/2011  . Contrast media [iodinated diagnostic agents] Rash 11/14/2011    Family History  Problem Relation Age of Onset  . Heart disease Father   . Hypertension Mother     History   Social History  . Marital Status: Married    Spouse Name: N/A    Number of Children: 2  . Years of Education: N/A   Occupational History  . Homemaker    Social History Main Topics  . Smoking status: Current Some Day Smoker -- 0.10 packs/day for 10 years    Types: Cigarettes  . Smokeless tobacco: Never Used  . Alcohol Use: No  . Drug Use: No  . Sexual Activity: Yes    Birth Control/ Protection: Other-see comments   Other Topics Concern  . Not on file   Social History Narrative  . No narrative on file      Physical Exam: BP  172/104  Pulse 64  Ht 5\' 4"  (1.626 m)  Wt 203 lb 12.8 oz (92.443 kg)  BMI 34.96 kg/m2 Constitutional: generally well-appearing Psychiatric: alert and oriented x3 Abdomen: soft, nontender, nondistended, no obvious ascites, no peritoneal signs, normal bowel sounds     Assessment and plan: 37 y.o. female with unusual combination of some dysphagia as well as epigastric, chest discomforts  First I want to proceed with imaging tests to rule out extrinsic compression of her esophagus from a mediastinal mass. That'll be with a CT scan with IV contrast. If it is normal then going to proceed with esophageal manometry testing to check for achalasia.

## 2013-03-25 NOTE — Patient Instructions (Addendum)
One of your biggest health concerns is your smoking.  This increases your risk for most cancers and serious cardiovascular diseases such as strokes, heart attacks.  You should try your best to stop.  If you need assistance, please contact your PCP or Smoking Cessation Class at [facility] [phone]) or [facility] Quit-Line (1-800-QUIT-NOW). Chest CT with IV contrast for chest discomfort, dysphagia.  You have been scheduled for a CT scan of the Chest at [facility] ([address]---this is in the same building as Architectural technologist).   You are scheduled on 03/26/13 at 230 pm. You should arrive 15 minutes prior to your appointment time for registration. Please follow the written instructions below on the day of your exam:  WARNING: IF YOU ARE ALLERGIC TO IODINE/X-RAY DYE, PLEASE NOTIFY RADIOLOGY IMMEDIATELY AT [phone]! YOU WILL BE GIVEN A 13 HOUR PREMEDICATION PREP.  1) Do not eat or drink anything after 1230 pm (2 hours prior to your test) You may take any medications as prescribed with a small amount of water except for the following: Metformin, Glucophage, Glucovance, Avandamet, Riomet, Fortamet, Actoplus Met, Janumet, Glumetza or Metaglip. The above medications must be held the day of the exam AND 48 hours after the exam.  The purpose of you drinking the oral contrast is to aid in the visualization of your intestinal tract. The contrast solution may cause some diarrhea. Before your exam is started, you will be given a small amount of fluid to drink. Depending on your individual set of symptoms, you may also receive an intravenous injection of x-ray contrast/dye. Plan on being at [facility] for 30 minutes or long, depending on the type of exam you are having performed.  This test typically takes 30-45 minutes to complete.  If you have any questions regarding your exam or if you need to reschedule, you may call the CT department at [phone] between the hours of  8:00 am and 5:00 pm, Monday-Friday.  ________________________________________________________________________  IF this is unhelpful, then esophageal manometry testing.

## 2013-03-26 ENCOUNTER — Ambulatory Visit (INDEPENDENT_AMBULATORY_CARE_PROVIDER_SITE_OTHER)
Admission: RE | Admit: 2013-03-26 | Discharge: 2013-03-26 | Disposition: A | Discharge status: Home / Self Care | Payer: Medicaid Other | Source: Ambulatory Visit | Attending: Gastroenterology | Admitting: Gastroenterology

## 2013-03-26 ENCOUNTER — Encounter: Payer: Self-pay | Admitting: Advanced Practice Midwife

## 2013-03-26 DIAGNOSIS — R0789 Other chest pain: Secondary | ICD-10-CM

## 2013-03-26 DIAGNOSIS — R1310 Dysphagia, unspecified: Secondary | ICD-10-CM

## 2013-03-27 DIAGNOSIS — R1310 Dysphagia, unspecified: Secondary | ICD-10-CM

## 2013-04-01 ENCOUNTER — Ambulatory Visit (HOSPITAL_COMMUNITY)
Admission: RE | Admit: 2013-04-01 | Discharge: 2013-04-01 | Disposition: A | Discharge status: Home / Self Care | Payer: Medicaid Other | Source: Ambulatory Visit | Attending: Gastroenterology | Admitting: Gastroenterology

## 2013-04-01 ENCOUNTER — Encounter: Payer: Self-pay | Admitting: Obstetrics

## 2013-04-01 ENCOUNTER — Encounter (HOSPITAL_COMMUNITY)
Admission: RE | Disposition: A | Discharge status: Home / Self Care | Payer: Self-pay | Source: Ambulatory Visit | Attending: Gastroenterology

## 2013-04-01 ENCOUNTER — Ambulatory Visit (INDEPENDENT_AMBULATORY_CARE_PROVIDER_SITE_OTHER): Payer: Medicaid Other | Admitting: Obstetrics

## 2013-04-01 VITALS — BP 175/108 | HR 80 | Temp 98.6°F | Ht 64.0 in | Wt 205.0 lb

## 2013-04-01 DIAGNOSIS — N83209 Unspecified ovarian cyst, unspecified side: Secondary | ICD-10-CM | POA: Insufficient documentation

## 2013-04-01 DIAGNOSIS — N949 Unspecified condition associated with female genital organs and menstrual cycle: Secondary | ICD-10-CM | POA: Insufficient documentation

## 2013-04-01 DIAGNOSIS — R1310 Dysphagia, unspecified: Secondary | ICD-10-CM

## 2013-04-01 DIAGNOSIS — R0789 Other chest pain: Secondary | ICD-10-CM

## 2013-04-01 DIAGNOSIS — R1013 Epigastric pain: Secondary | ICD-10-CM

## 2013-04-01 DIAGNOSIS — R52 Pain, unspecified: Secondary | ICD-10-CM

## 2013-04-01 DIAGNOSIS — K222 Esophageal obstruction: Secondary | ICD-10-CM | POA: Insufficient documentation

## 2013-04-01 HISTORY — PX: ESOPHAGEAL MANOMETRY: SHX5429

## 2013-04-01 SURGERY — MANOMETRY, ESOPHAGUS
Anesthesia: Topical

## 2013-04-01 MED ORDER — LIDOCAINE VISCOUS 2 % MT SOLN
OROMUCOSAL | Status: AC
  Filled 2013-04-01: qty 15

## 2013-04-01 SURGICAL SUPPLY — 4 items
DRAPE UTILITY 15X26 W/TAPE STR (DRAPE) ×2 IMPLANT
GLOVE BIOGEL PI IND STRL 8 (GLOVE) ×1 IMPLANT
GLOVE BIOGEL PI INDICATOR 8 (GLOVE) ×1
GOWN PREVENTION PLUS LG XLONG (DISPOSABLE) ×2 IMPLANT

## 2013-04-01 NOTE — Progress Notes (Signed)
Subjective:     Suzanne [name] is a 37 y.o. female here for a routine exam.  Current complaints: Patient is in the office for consultation for ultrasound done revealing an ovarian cyst. Patient states she was seen at [facility]- [place]- 3 weeks ago..  Personal health questionnaire reviewed: yes.   Gynecologic History No LMP recorded. Patient has had a hysterectomy. Contraception: status post hysterectomy Last Pap:2000 . Results were: abnormal Last mammogram: never.   Obstetric History OB History  Gravida Para Term Preterm AB SAB TAB Ectopic Multiple Living  2 2  2           # Outcome Date GA Lbr Len/2nd Weight Sex Delivery Anes PTL Lv  2 PRE           1 PRE                The following portions of the patient's history were reviewed and updated as appropriate: allergies, current medications, past family history, past medical history, past social history, past surgical history and problem list.  Review of Systems Pertinent items are noted in HPI.    Objective:    No exam performed today, Consult only..    Assessment:    Ovarian cyst, small and physiologic in appearance on ultrasound.   Plan:    Education reviewed: Management of physiologic ovarian cysts.. The patient is a patient of the [facility] and will follow up there.

## 2013-04-02 ENCOUNTER — Encounter (HOSPITAL_COMMUNITY): Payer: Self-pay | Admitting: Gastroenterology

## 2013-04-05 ENCOUNTER — Encounter: Payer: Self-pay | Admitting: Diagnostic Neuroimaging

## 2013-04-05 ENCOUNTER — Encounter (INDEPENDENT_AMBULATORY_CARE_PROVIDER_SITE_OTHER): Payer: Self-pay

## 2013-04-05 ENCOUNTER — Ambulatory Visit (INDEPENDENT_AMBULATORY_CARE_PROVIDER_SITE_OTHER): Payer: Medicaid Other | Admitting: Diagnostic Neuroimaging

## 2013-04-05 VITALS — BP 133/88 | HR 73 | Temp 98.6°F | Ht 64.0 in | Wt 205.0 lb

## 2013-04-05 DIAGNOSIS — M79605 Pain in left leg: Secondary | ICD-10-CM

## 2013-04-05 DIAGNOSIS — M542 Cervicalgia: Secondary | ICD-10-CM

## 2013-04-05 DIAGNOSIS — M79604 Pain in right leg: Secondary | ICD-10-CM

## 2013-04-05 DIAGNOSIS — M545 Low back pain, unspecified: Secondary | ICD-10-CM

## 2013-04-05 DIAGNOSIS — G894 Chronic pain syndrome: Secondary | ICD-10-CM

## 2013-04-05 DIAGNOSIS — M79609 Pain in unspecified limb: Secondary | ICD-10-CM

## 2013-04-05 NOTE — Patient Instructions (Signed)
Continued 300 mg 3 times a day for at least one month. Then increase to 600 mg 3 times per day.  Try to follow physical therapy recommended exercises for at least 10-20 minutes per day.

## 2013-04-05 NOTE — Progress Notes (Signed)
[facility]  PATIENT: Suzanne [name] DOB: 04-27-1976  REFERRING CLINICIAN: Lauenstein HISTORY FROM: patient REASON FOR VISIT: new consult   HISTORICAL  CHIEF COMPLAINT:  Chief Complaint  Patient presents with  . Fall    has fell 6 times since January    HISTORY OF PRESENT ILLNESS:   UPDATE 04/05/13: Since last visit patient was followed at the pain management clinic, treated with oxycodone, OxyContin, Suboxone, Lyrica, Cymbalta without relief. Patient continues to have low back pain, bilateral leg pain, intermittent leg weakness, neck pain, shoulder pain. She may be developing some depression as well. She went to chiropractor or physical therapy in 2013 and 2014 without relief. Patient unable to work. Also having some intermittent episodes of legs giving out and falling down. No loss of consciousness, seizures, convulsions. Patient thinks her falls are related to her pain.  PRIOR HPI (06/02/11): 37 year old right-handed female with hypertension, here for evaluation of headache and back pain.  Headache started 3 or 4 months ago.  Bitemporal, throbbing, severe headaches with photophobia and vision changes. Sometimes nausea. She takes butalbital with mild relief.  She has 10-15 days of headache per month. No triggering factors.  Back pain consists of neck and lower back pain. She has pain along her entire spine. No triggering factors. His been going on for several years. Sometimes she has numbness in the left leg.  She has tried physical therapy in the past. No imaging studies.   REVIEW OF SYSTEMS: Full 14 system review of systems performed and notable only for joint pain anemia headache weakness weight gain decreased energy.  ALLERGIES: Allergies  Allergen Reactions  . Reglan [Metoclopramide] Shortness Of Breath  . Amoxicillin Rash  . Contrast Media [Iodinated Diagnostic Agents] Rash    HOME MEDICATIONS: Outpatient Prescriptions Prior to Visit  Medication  Sig Dispense Refill  . amitriptyline (ELAVIL) 10 MG tablet Take 10 mg by mouth at bedtime.      . gabapentin (NEURONTIN) 300 MG capsule Take 300 mg by mouth 3 (three) times daily.      Marland Kitchen lisinopril-hydrochlorothiazide (PRINZIDE,ZESTORETIC) 20-25 MG per tablet Take 1 tablet by mouth daily.      . metoprolol succinate (TOPROL-XL) 100 MG 24 hr tablet Take 100 mg by mouth daily. Take with or immediately following a meal.      . omeprazole (PRILOSEC) 40 MG capsule Take 40 mg by mouth daily.      . hydrochlorothiazide (HYDRODIURIL) 25 MG tablet Take 25 mg by mouth daily.      Marland Kitchen oxyCODONE-acetaminophen (PERCOCET) 5-325 MG per tablet Take 1 tablet by mouth 4 (four) times daily as needed for pain.        No facility-administered medications prior to visit.    PAST MEDICAL HISTORY: Past Medical History  Diagnosis Date  . Asthma   . Hypertension   . Migraine   . Anemia   . Neck pain, chronic   . GERD (gastroesophageal reflux disease)   . Chronic lower back pain     PAST SURGICAL HISTORY: Past Surgical History  Procedure Laterality Date  . Vaginal hysterectomy    . Tubal ligation    . Esophageal dilation    . Esophagogastroduodenoscopy (egd) with esophageal dilation N/A 02/28/2013    Procedure: ESOPHAGOGASTRODUODENOSCOPY (EGD) WITH ESOPHAGEAL DILATION;  Surgeon: Rachael Fee, MD;  Location: [facility];  Service: Endoscopy;  Laterality: N/A;  . Esophageal manometry N/A 04/01/2013    Procedure: ESOPHAGEAL MANOMETRY (EM);  Surgeon: Rachael Fee, MD;  Location: [facility];  Service: Endoscopy;  Laterality: N/A;    FAMILY HISTORY: Family History  Problem Relation Age of Onset  . Heart disease Father   . Hypertension Mother     SOCIAL HISTORY:  History   Social History  . Marital Status: Married    Spouse Name: Kendrick    Number of Children: 2  . Years of Education: College   Occupational History  . Homemaker   .  Other    [employer]   Social History  Main Topics  . Smoking status: Current Some Day Smoker -- 0.10 packs/day for 10 years    Types: Cigarettes, Cigars  . Smokeless tobacco: Never Used  . Alcohol Use: No  . Drug Use: No  . Sexual Activity: Yes    Birth Control/ Protection: Other-see comments, Surgical   Other Topics Concern  . Not on file   Social History Narrative   Patient lives at home with family.   Patient goes to [employer].   Caffeine Use 5-6 cups daily   Patient has 2 adopted children.     PHYSICAL EXAM  Filed Vitals:   04/05/13 0935  BP: 133/88  Pulse: 73  Temp: 98.6 F (37 C)  TempSrc: Oral  Height: 5\' 4"  (1.626 m)  Weight: 205 lb (92.987 kg)    Not recorded    Body mass index is 35.17 kg/(m^2).  GENERAL EXAM: Patient is in no distress  CARDIOVASCULAR: Regular rate and rhythm, no murmurs, no carotid bruits  NEUROLOGIC: MENTAL STATUS: awake, alert, language fluent, comprehension intact, naming intact CRANIAL NERVE: no papilledema on fundoscopic exam, pupils equal and reactive to light, visual fields full to confrontation, extraocular muscles intact, no nystagmus, facial sensation and strength symmetric, uvula midline, shoulder shrug symmetric, tongue midline. MOTOR: normal bulk and tone, full strength in the BUE, BLE SENSORY: normal and symmetric to light touch, pinprick, temperature, vibration COORDINATION: finger-nose-finger, fine finger movements normal REFLEXES: deep tendon reflexes present and symmetric GAIT/STATION: narrow based gait; able to walk on tandem; romberg is negative   DIAGNOSTIC DATA (LABS, IMAGING, TESTING) - I reviewed patient records, labs, notes, testing and imaging myself where available.  Lab Results  Component Value Date   WBC 10.8* 02/26/2013   HGB 11.6* 02/26/2013   HCT 34.7* 02/26/2013   MCV 70.4* 02/26/2013   PLT 306 02/26/2013      Component Value Date/Time   NA 138 02/26/2013 1635   K 3.7 02/26/2013 1635   CL 98 02/26/2013 1635   CO2 31 02/26/2013 1635     GLUCOSE 90 02/26/2013 1635   BUN 9 02/26/2013 1635   CREATININE 0.66 02/26/2013 1635   CALCIUM 9.4 02/26/2013 1635   PROT 7.4 02/26/2013 1635   ALBUMIN 3.4* 02/26/2013 1635   AST 13 02/26/2013 1635   ALT 9 02/26/2013 1635   ALKPHOS 81 02/26/2013 1635   BILITOT 0.3 02/26/2013 1635   GFRNONAA >90 02/26/2013 1635   GFRAA >90 02/26/2013 1635   No results found for this basename: CHOL, HDL, LDLCALC, LDLDIRECT, TRIG, CHOLHDL   No results found for this basename: HGBA1C   No results found for this basename: VITAMINB12   No results found for this basename: TSH    06/14/11 MRI brain - normal  06/14/11 MRI cervical spine (without contrast): 1. Mild posterior central disc protrusions at C4-5 and C5-6.   2. No spinal stenosis or foraminal narrowing.   06/14/11 MRI lumbar spine (without contrast): 1. At L4-5: Disc bulging and  facet hypertrophy with moderate biforaminal stenosis. 2. At L5-S1: Disc bulging with no spinal stenosis or foraminal narrowing.    ASSESSMENT AND PLAN  37 y.o. year old female here with chronic neck pain, back pain, leg pain. Also with intermittent episodes of leg weakness and falling down. Neurologic examination is unremarkable at this time. No clear etiology from primary neurologic standpoint. Could be related to patient's underlying chronic pain. In  PLAN: - continue pain mgmt  Return for return to PCP.    Suanne Marker, MD 04/05/2013, 10:53 AM Certified in Neurology, Neurophysiology and Neuroimaging  [facility] [address] [place], [place] [address] [phone]

## 2013-04-10 NOTE — ED Provider Notes (Addendum)
I have reviewed the resident's ekg interpretation, patient's ekg, and agree with interpretation.  DOS 02/26/2013- this is a late entry.  Hilario Quarry, MD 04/10/13 2130  Hilario Quarry, MD 04/10/13 2017

## 2013-04-29 ENCOUNTER — Ambulatory Visit: Payer: Self-pay | Admitting: Obstetrics

## 2013-04-30 ENCOUNTER — Telehealth: Payer: Self-pay | Admitting: Gastroenterology

## 2013-04-30 NOTE — Telephone Encounter (Signed)
Pt has been scheduled to see Dr Christella Hartigan

## 2013-04-30 NOTE — Telephone Encounter (Signed)
Left message on machine to call back  

## 2013-04-30 NOTE — Telephone Encounter (Signed)
Impression from last month esophageal manometry 1. Normal esophageal peristalsis with mildly elevated residual LES pressure consistent hypertensive LES (mild EGJ outflow obstruction).     Patty, can you call her. The esophageal manometry suggests possible tight muscle at lower esophagus.  Has not really responded to dilation. She needs rov in next 2-3 weeks to discuss options (meds), pending discussion on her dysphagia.

## 2013-05-20 ENCOUNTER — Other Ambulatory Visit: Payer: Self-pay | Admitting: Family Medicine

## 2013-05-27 ENCOUNTER — Ambulatory Visit (INDEPENDENT_AMBULATORY_CARE_PROVIDER_SITE_OTHER): Payer: Medicaid Other | Admitting: Gastroenterology

## 2013-05-27 ENCOUNTER — Encounter: Payer: Self-pay | Admitting: Gastroenterology

## 2013-05-27 VITALS — BP 160/98 | HR 80 | Ht 64.25 in | Wt 205.4 lb

## 2013-05-27 DIAGNOSIS — R1314 Dysphagia, pharyngoesophageal phase: Secondary | ICD-10-CM

## 2013-05-27 NOTE — Patient Instructions (Addendum)
You have been given a separate informational sheet regarding your tobacco use, the importance of quitting and local resources to help you quit. Chew your food well, eat slowly, take small bites. Call if your swallowing worsens or if you change your mind about referral to [facility] for second opinion about your swallowing.

## 2013-05-27 NOTE — Progress Notes (Signed)
Review of pertinent gastrointestinal problems:  1. dysphagia; had EGD with 20mm balloon dilation 11/2011 for dysphagia, mild GE junction narrowing with improvement of her symptoms after dilation. 02/2013 abd pain to er, also dysphagia. Repeat EGD 02/2013 Christella Hartigan again found slightly narrow, smooth GE junction, dilated to 20mm. ? Need for achalasia workup after EGD. CT chest 02/2013 showed no extrinsic compression. 03/2013 esophageal manometry Normal esophageal peristalsis with mildly elevated residual LES pressure consistent hypertensive LES (mild EGJ outflow obstruction).  2. Abd, epig pain 2014, to ER: [place] of abd was normal; cbc slightly elevated WBC, normal CMET.   HPI: This is a  very pleasant 37 year old woman whom I last saw about 2 or 3 months ago.  We discussed the results of her recent esophageal manometry testing  Still has trouble swallowing.  Pills stick.  Food will also hang and catch more than once per day.  Will occasionally have to vomit it out.  No relief with second  Has lost 6 pounds, intentionally.   Past Medical History  Diagnosis Date  . Asthma   . Hypertension   . Migraine   . Anemia   . Neck pain, chronic   . GERD (gastroesophageal reflux disease)   . Chronic lower back pain     Past Surgical History  Procedure Laterality Date  . Vaginal hysterectomy    . Tubal ligation    . Esophageal dilation    . Esophagogastroduodenoscopy (egd) with esophageal dilation N/A 02/28/2013    Procedure: ESOPHAGOGASTRODUODENOSCOPY (EGD) WITH ESOPHAGEAL DILATION;  Surgeon: Rachael Fee, MD;  Location: [facility];  Service: Endoscopy;  Laterality: N/A;  . Esophageal manometry N/A 04/01/2013    Procedure: ESOPHAGEAL MANOMETRY (EM);  Surgeon: Rachael Fee, MD;  Location: [facility];  Service: Endoscopy;  Laterality: N/A;    Current Outpatient Prescriptions  Medication Sig Dispense Refill  . amitriptyline (ELAVIL) 10 MG tablet Take 10 mg by mouth at bedtime.      Marland Kitchen  omeprazole (PRILOSEC) 40 MG capsule Take 40 mg by mouth daily.      Marland Kitchen oxyCODONE (ROXICODONE) 15 MG immediate release tablet Take 10 mg by mouth 5 (five) times daily.        No current facility-administered medications for this visit.    Allergies as of 05/27/2013 - Review Complete 05/27/2013  Allergen Reaction Noted  . Reglan [metoclopramide] Shortness Of Breath 11/14/2011  . Amoxicillin Rash 11/14/2011  . Contrast media [iodinated diagnostic agents] Rash 11/14/2011    Family History  Problem Relation Age of Onset  . Heart disease Father   . Hypertension Mother     History   Social History  . Marital Status: Married    Spouse Name: Kendrick    Number of Children: 2  . Years of Education: College   Occupational History  . Homemaker   .  Other    [employer]   Social History Main Topics  . Smoking status: Current Some Day Smoker -- 0.10 packs/day for 10 years    Types: Cigarettes, Cigars  . Smokeless tobacco: Never Used  . Alcohol Use: No  . Drug Use: No  . Sexual Activity: Yes    Birth Control/ Protection: Other-see comments, Surgical   Other Topics Concern  . Not on file   Social History Narrative   Patient lives at home with family.   Patient goes to [employer].   Caffeine Use 5-6 cups daily   Patient has 2 adopted children.  Physical Exam: BP 160/98  Pulse 80  Ht 5' 4.25" (1.632 m)  Wt 205 lb 6 oz (93.157 kg)  BMI 34.98 kg/m2 Constitutional: generally well-appearing Psychiatric: alert and oriented x3 Abdomen: soft, nontender, nondistended, no obvious ascites, no peritoneal signs, normal bowel sounds     Assessment and plan: 37 y.o. female with intermittent dysphasia  She has slightly elevated lower esophageal sphincter residual pressure. This did not respond to dilation 2 months ago. Previously he had respond to dilation. After her 2 options. One was referral to tertiary center such as [facility]. The other was simply  observing herself clinically. She'll continue to chew food slowly eating well and take small bites. She knows to call here if things worsen. She opted against referral to tertiary center.

## 2013-05-30 ENCOUNTER — Encounter (HOSPITAL_COMMUNITY): Payer: Self-pay | Admitting: Emergency Medicine

## 2013-05-30 ENCOUNTER — Emergency Department (HOSPITAL_COMMUNITY)
Admission: EM | Admit: 2013-05-30 | Discharge: 2013-05-30 | Disposition: A | Discharge status: Home / Self Care | Payer: Medicaid Other | Source: Home / Self Care

## 2013-05-30 DIAGNOSIS — H16121 Filamentary keratitis, right eye: Secondary | ICD-10-CM

## 2013-05-30 MED ORDER — TETRACAINE HCL 0.5 % OP SOLN
OPHTHALMIC | Status: AC
  Filled 2013-05-30: qty 2

## 2013-05-30 NOTE — ED Provider Notes (Signed)
CSN: [id number]     Arrival date & time 05/30/13  1130 History   None    Chief Complaint  Patient presents with  . Eye Problem   (Consider location/radiation/quality/duration/timing/severity/associated sxs/prior Treatment) Patient is a 38 y.o. female presenting with eye problem. The history is provided by the patient.  Eye Problem Location:  R eye Quality:  Sharp and tearing Severity:  Moderate Onset quality:  Gradual Duration:  3 days Timing:  Constant Progression:  Unchanged Chronicity:  New Ineffective treatments:  Eye drops (seen by lmd 2d ago , given drops without relief.) Associated symptoms: blurred vision, photophobia, redness and tearing   Associated symptoms: no discharge and no facial rash   Risk factors: recent URI   Risk factors: no conjunctival hemorrhage, not exposed to pinkeye and no recent herpes zoster     Past Medical History  Diagnosis Date  . Asthma   . Hypertension   . Migraine   . Anemia   . Neck pain, chronic   . GERD (gastroesophageal reflux disease)   . Chronic lower back pain    Past Surgical History  Procedure Laterality Date  . Vaginal hysterectomy    . Tubal ligation    . Esophageal dilation    . Esophagogastroduodenoscopy (egd) with esophageal dilation N/A 02/28/2013    Procedure: ESOPHAGOGASTRODUODENOSCOPY (EGD) WITH ESOPHAGEAL DILATION;  Surgeon: Milus Banister, MD;  Location: [facility];  Service: Endoscopy;  Laterality: N/A;  . Esophageal manometry N/A 04/01/2013    Procedure: ESOPHAGEAL MANOMETRY (EM);  Surgeon: Milus Banister, MD;  Location: [facility];  Service: Endoscopy;  Laterality: N/A;   Family History  Problem Relation Age of Onset  . Heart disease Father   . Hypertension Mother    History  Substance Use Topics  . Smoking status: Current Some Day Smoker -- 0.10 packs/day for 10 years    Types: Cigarettes, Cigars  . Smokeless tobacco: Never Used  . Alcohol Use: No   OB History   Grav Para Term Preterm Abortions  TAB SAB Ect Mult Living   2 2  2            Review of Systems  Constitutional: Negative.   Eyes: Positive for blurred vision, photophobia, pain, redness and visual disturbance. Negative for discharge.  Skin: Negative for rash.    Allergies  Reglan; Amoxicillin; and Contrast media  Home Medications   Current Outpatient Rx  Name  Route  Sig  Dispense  Refill  . amitriptyline (ELAVIL) 10 MG tablet   Oral   Take 10 mg by mouth at bedtime.         Marland Kitchen omeprazole (PRILOSEC) 40 MG capsule   Oral   Take 40 mg by mouth daily.         Marland Kitchen oxyCODONE (ROXICODONE) 15 MG immediate release tablet   Oral   Take 10 mg by mouth 5 (five) times daily.           BP 167/106  Pulse 76  Temp(Src) 98.5 F (36.9 C) (Oral)  Resp 18  SpO2 95% Physical Exam  Nursing note and vitals reviewed. Constitutional: She appears well-developed and well-nourished. She appears distressed.  HENT:  Right Ear: External ear normal.  Left Ear: External ear normal.  Mouth/Throat: Oropharynx is clear and moist.  Eyes: EOM and lids are normal. Pupils are equal, round, and reactive to light. Right conjunctiva is injected.  Slit lamp exam:      The right eye shows corneal ulcer and fluorescein uptake.  Neck: Normal range of motion. Neck supple.  Lymphadenopathy:    She has no cervical adenopathy.  Skin: Skin is warm and dry. No rash noted.    ED Course  Procedures (including critical care time) Labs Review Labs Reviewed - No data to display Imaging Review No results found.  EKG Interpretation    Date/Time:    Ventricular Rate:    PR Interval:    QRS Duration:   QT Interval:    QTC Calculation:   R Axis:     Text Interpretation:              MDM  Discussed with dr Ellie Lunch , prob herpetic, will see in office at 4pm.    Billy Fischer, MD 05/30/13 1318

## 2013-05-30 NOTE — ED Notes (Signed)
Pt  Reports  Pain  r  Eye  With  Drainage  And  Redness  painfull      To  Light           Also  Reports  Some  Nausea  And  Malaise    -  Pt  States  Went to pcp  And  Was  Given     Eye  Drops

## 2013-06-14 ENCOUNTER — Other Ambulatory Visit: Payer: Self-pay | Admitting: Family Medicine

## 2013-06-17 ENCOUNTER — Other Ambulatory Visit: Payer: Self-pay | Admitting: Family Medicine

## 2013-06-18 ENCOUNTER — Other Ambulatory Visit: Payer: Self-pay | Admitting: Family Medicine

## 2013-07-02 ENCOUNTER — Encounter: Payer: Self-pay | Admitting: Cardiovascular Disease

## 2013-07-02 ENCOUNTER — Ambulatory Visit (INDEPENDENT_AMBULATORY_CARE_PROVIDER_SITE_OTHER): Payer: Medicaid Other | Admitting: Cardiovascular Disease

## 2013-07-02 VITALS — BP 170/102 | HR 77 | Ht 64.0 in | Wt 214.9 lb

## 2013-07-02 DIAGNOSIS — R0789 Other chest pain: Secondary | ICD-10-CM | POA: Insufficient documentation

## 2013-07-02 DIAGNOSIS — R5381 Other malaise: Secondary | ICD-10-CM

## 2013-07-02 DIAGNOSIS — I119 Hypertensive heart disease without heart failure: Secondary | ICD-10-CM

## 2013-07-02 DIAGNOSIS — E669 Obesity, unspecified: Secondary | ICD-10-CM | POA: Insufficient documentation

## 2013-07-02 DIAGNOSIS — I10 Essential (primary) hypertension: Secondary | ICD-10-CM

## 2013-07-02 DIAGNOSIS — R079 Chest pain, unspecified: Secondary | ICD-10-CM

## 2013-07-02 DIAGNOSIS — R5383 Other fatigue: Secondary | ICD-10-CM

## 2013-07-02 DIAGNOSIS — R0602 Shortness of breath: Secondary | ICD-10-CM

## 2013-07-02 MED ORDER — AMLODIPINE BESYLATE 5 MG PO TABS: 5.0000 mg | ORAL_TABLET | Freq: Every day | ORAL | Status: DC

## 2013-07-02 MED ORDER — OLMESARTAN MEDOXOMIL-HCTZ 40-25 MG PO TABS: 1.0000 | ORAL_TABLET | Freq: Every day | ORAL | Status: DC

## 2013-07-02 NOTE — Progress Notes (Signed)
Patient ID: Suzanne [name], female   DOB: 1975/06/27, 38 y.o.   MRN: [id number]     PATIENT PROFILE: [name] is a 38 year old African American female who is referred to me today for evaluation of difficult to control high blood pressure as well as shortness of breath and chest pain.   HPI: [name] has a long-standing history of hypertension which initially developed at age 38. She states over the years she's been on numerous medications. Most recently, she has been on a regimen consisting of Benicar HCT 40/12.5, clonidine 0.2 mg. She states her blood pressure is always elevated per she also recently has noticed development of shortness of breath, particularly with activity. She also is noticed some chest tightness with radiation to her left arm. At times he awakes at night with palpitations. She states her shortness of breath is significant enough that at times when she goes to the grocery store she has to be in a chair and wheel around to do her grocery shopping. She does admit to a long-standing history of fibromyalgia and chronic pain. She is seen [facility] for her pain. She has been found to have mild posterior central disc protrusions at C4-5 and C5-6 without spinal stenosis or foraminal narrowing and she has mild disc bulging and facet hypertrophy with moderate bi-foraminal stenosis at L4-5 and disc bulging with no spinal stenosis or foraminal narrowing at L5-S1.  [name] does have a history of cigarette smoking for at least 12 years. She quit cigarettes for 4 years. More recently she's been smoking cigars. She now presents for cardiology evaluation.  Past Medical History  Diagnosis Date  . Asthma   . Hypertension   . Migraine   . Anemia   . Neck pain, chronic   . GERD (gastroesophageal reflux disease)   . Chronic lower back pain     Past Surgical History  Procedure Laterality Date  . Vaginal hysterectomy    . Tubal ligation    . Esophageal dilation      . Esophagogastroduodenoscopy (egd) with esophageal dilation N/A 02/28/2013    Procedure: ESOPHAGOGASTRODUODENOSCOPY (EGD) WITH ESOPHAGEAL DILATION;  Surgeon: Milus Banister, MD;  Location: [facility];  Service: Endoscopy;  Laterality: N/A;  . Esophageal manometry N/A 04/01/2013    Procedure: ESOPHAGEAL MANOMETRY (EM);  Surgeon: Milus Banister, MD;  Location: [facility];  Service: Endoscopy;  Laterality: N/A;    Allergies  Allergen Reactions  . Reglan [Metoclopramide] Shortness Of Breath  . Lisinopril Cough  . Losartan Cough  . Amoxicillin Rash  . Contrast Media [Iodinated Diagnostic Agents] Rash    Current Outpatient Prescriptions  Medication Sig Dispense Refill  . acyclovir (ZOVIRAX) 400 MG tablet Take 400 mg by mouth 2 (two) times daily.      Marland Kitchen amitriptyline (ELAVIL) 50 MG tablet Take 50 mg by mouth at bedtime.      . cloNIDine (CATAPRES) 0.2 MG tablet Take 0.2 mg by mouth 2 (two) times daily.      Marland Kitchen olmesartan-hydrochlorothiazide (BENICAR HCT) 40-12.5 MG per tablet Take 1 tablet by mouth daily.      Marland Kitchen omeprazole (PRILOSEC) 40 MG capsule Take 40 mg by mouth daily.      Marland Kitchen oxyCODONE (ROXICODONE) 15 MG immediate release tablet Take 10 mg by mouth 5 (five) times daily.       . sertraline (ZOLOFT) 100 MG tablet Take 100 mg by mouth daily.       No current facility-administered medications for this  visit.    Social history is notable in that she is married for 6 years. She has a 53 year old daughter and a 53 year old son. She completed 12th grade education. She is not drink alcohol. She does not typically exercise.  Family History  Problem Relation Age of Onset  . Heart disease Father   . Hypertension Mother   . Hyperlipidemia Maternal Grandmother   . Hypertension Maternal Grandmother   . Hypertension Paternal Grandmother   . Diabetes Paternal Grandmother   . Cancer - Lung Paternal Grandmother     ROS is negative for fever chills or night sweats. She admits to occasional  headaches. She denies change in vision or hearing. There is no lymphadenopathy. She denies wheezing. There is no PND or orthopnea. Time she notes palpitations and did awaken from sleep with this. She does note some occasional left arm numbness. She does have fibromyalgia. She does have  Episodes of chest tightness. She admits to exertional shortness of breath. She denies change in bowel bladder habits. There is no nausea or vomiting. She denies claudication. She does note leg swelling intermittently. She denies tremors. She denies rash she does have a disc disease as noted above. She has had instances of dysphagia and underwent esophageal dilatation in 2013. Other comprehensive 14 point system review is negative.  PE BP 170/102  Pulse 77  Ht 5\' 4"  (1.626 m)  Wt 214 lb 14.4 oz (97.478 kg)  BMI 36.87 kg/m2 General: Alert, oriented, no distress.  Skin: normal turgor, no rashes HEENT: Normocephalic, atraumatic. Pupils round and reactive; sclera anicteric; extraocular muscles intact; Fundi mild arteriolar narrowing without hemorrhages or exudate Nose without nasal septal hypertrophy Mouth/Parynx benign; Mallinpatti scale 3. She did have tongue piercing. Neck: Thick neck ;No JVD, no carotid bruits; normal carotid upstroke Lungs: clear to ausculatation and percussion; no wheezing or rales Chest wall: without tenderness to palpitation Heart: RRR, s1 s2 normal 1/6 systolic murmur. No S3 or S4 gallop. Abdomen: Moderately obese; soft, nontender; no hepatosplenomehaly, BS+; abdominal aorta nontender and not dilated by palpation. Back: no CVA tenderness Pulses 2+ Extremities: no clubbinbg cyanosis or edema, Homan's sign negative  Neurologic: grossly nonfocal; Cranial nerves grossly wnl Psychologic: Normal mood and affect    ECG (independently read by me): Normal sinus rhythm at 77 beats per minute. Nonspecific T changes.  LABS:  BMET    Component Value Date/Time   NA 138 02/26/2013 1635   K 3.7  02/26/2013 1635   CL 98 02/26/2013 1635   CO2 31 02/26/2013 1635   GLUCOSE 90 02/26/2013 1635   BUN 9 02/26/2013 1635   CREATININE 0.66 02/26/2013 1635   CALCIUM 9.4 02/26/2013 1635   GFRNONAA >90 02/26/2013 1635   GFRAA >90 02/26/2013 1635     Hepatic Function Panel     Component Value Date/Time   PROT 7.4 02/26/2013 1635   ALBUMIN 3.4* 02/26/2013 1635   AST 13 02/26/2013 1635   ALT 9 02/26/2013 1635   ALKPHOS 81 02/26/2013 1635   BILITOT 0.3 02/26/2013 1635     CBC    Component Value Date/Time   WBC 10.8* 02/26/2013 1635   RBC 4.93 02/26/2013 1635   HGB 11.6* 02/26/2013 1635   HCT 34.7* 02/26/2013 1635   PLT 306 02/26/2013 1635   MCV 70.4* 02/26/2013 1635   MCH 23.5* 02/26/2013 1635   MCHC 33.4 02/26/2013 1635   RDW 15.8* 02/26/2013 1635   LYMPHSABS 2.3 02/26/2013 1635   MONOABS 1.1* 02/26/2013 1635   EOSABS  0.2 02/26/2013 1635   BASOSABS 0.0 02/26/2013 1635     BNP No results found for this basename: probnp    Lipid Panel  No results found for this basename: chol, trig, hdl, cholhdl, vldl, ldlcalc     RADIOLOGY: No results found.   ASSESSMENT AND PLAN: My impression is that [name] is a 38 year old African American female who has an 18 year history of hypertension which initially was diagnosed at age 89. Presently, her blood pressure was elevated at 170/102 when taken by the nurse, but when rechecked by me was 152/94. She has experienced episodes of significant shortness of breath with activity associated with some mild episodes of chest tightness. She also has noticed nocturnal palpitations. Presently, recommending a complete set of laboratory be checked in the fasting state consisting of a CBC, see him he, thyroid function studies, lipid panel, as well as vitamin D level. I am recommending that she change her Benicar HCT to 40/25 and will start her on amlodipine 5 mg for additional blood pressure therapy. I'm scheduling her for a 2-D echo Doppler study to evaluate both systolic  and diastolic function as well as valvular architecture. I will also schedule her for a Hubbell study to assess her chest tightness to make certain this is not ischemia mediated. I will see her back in the future in followup of the results. It may also be worthwhile to consider evaluation for possible sleep apnea which may also be contributory to nocturnal palpitations and high blood pressure but I will not schedule her for this evaluation as of yet. Will see her back in the office in 6 weeks for evaluation or sooner if necessary.   Troy Sine, MD, [facility] 07/02/2013 11:34 AM

## 2013-07-02 NOTE — Patient Instructions (Signed)
Your physician has requested that you have a lexiscan myoview. For further information please visit [email or web address]. Please follow instruction sheet, as given.   Your physician recommends that you return for lab work fasting. DO NOT EAT OR DRINK AFTER MIDNIGHT.  Your physician has recommended you make the following change in your medication: Benicar/Hct was increased to 40/25. Start new prescription for amlodipine. These have already been sent to the pharmacy.  Your physician recommends that you schedule a follow-up appointment in: 6 weeks.

## 2013-07-03 ENCOUNTER — Telehealth: Payer: Self-pay | Admitting: Cardiovascular Disease

## 2013-07-03 MED ORDER — OLMESARTAN MEDOXOMIL-HCTZ 20-12.5 MG PO TABS: 1.0000 | ORAL_TABLET | Freq: Every day | ORAL | Status: DC

## 2013-07-03 NOTE — Telephone Encounter (Signed)
She says her Benicar needs prior authorization,How long will that take?

## 2013-07-03 NOTE — Telephone Encounter (Signed)
Returned call and pt verified x 2.  Pt informed message received.  Informed process may take a few days to be completed.  Pt stated she has about 3 days left of medication.  Pt informed her current dose is 40-25 mg and samples available of 20-12.5 mg.  Pt informed she would need to take two (2) tabs daily to meet her current dose and they will be left for her at the front desk.  Pt verbalized understanding and agreed w/ plan.  Message forwarded to W. Waddell, CMA for PA for Benicar-HCT 40-25 mg.

## 2013-07-04 ENCOUNTER — Telehealth: Payer: Self-pay | Admitting: *Deleted

## 2013-07-04 NOTE — Telephone Encounter (Signed)
Called Pharmacy and told them to change patient medication from Benicar/Hctz to Diovan/HCTZ because patient insurance no longer covered Benicar/Hctz. Patient was also notified of the changes.

## 2013-07-05 ENCOUNTER — Ambulatory Visit (HOSPITAL_COMMUNITY)
Admission: RE | Admit: 2013-07-05 | Discharge: 2013-07-05 | Disposition: A | Discharge status: Home / Self Care | Payer: Medicaid Other | Source: Ambulatory Visit | Attending: Cardiovascular Disease | Admitting: Cardiovascular Disease

## 2013-07-05 ENCOUNTER — Other Ambulatory Visit: Payer: Self-pay | Admitting: *Deleted

## 2013-07-05 DIAGNOSIS — R002 Palpitations: Secondary | ICD-10-CM | POA: Insufficient documentation

## 2013-07-05 DIAGNOSIS — R5381 Other malaise: Secondary | ICD-10-CM | POA: Insufficient documentation

## 2013-07-05 DIAGNOSIS — R5383 Other fatigue: Secondary | ICD-10-CM

## 2013-07-05 DIAGNOSIS — R0989 Other specified symptoms and signs involving the circulatory and respiratory systems: Secondary | ICD-10-CM | POA: Insufficient documentation

## 2013-07-05 DIAGNOSIS — I10 Essential (primary) hypertension: Secondary | ICD-10-CM | POA: Insufficient documentation

## 2013-07-05 DIAGNOSIS — R079 Chest pain, unspecified: Secondary | ICD-10-CM | POA: Insufficient documentation

## 2013-07-05 DIAGNOSIS — E669 Obesity, unspecified: Secondary | ICD-10-CM | POA: Insufficient documentation

## 2013-07-05 DIAGNOSIS — F17200 Nicotine dependence, unspecified, uncomplicated: Secondary | ICD-10-CM | POA: Insufficient documentation

## 2013-07-05 DIAGNOSIS — I119 Hypertensive heart disease without heart failure: Secondary | ICD-10-CM

## 2013-07-05 DIAGNOSIS — R42 Dizziness and giddiness: Secondary | ICD-10-CM | POA: Insufficient documentation

## 2013-07-05 DIAGNOSIS — I2510 Atherosclerotic heart disease of native coronary artery without angina pectoris: Secondary | ICD-10-CM | POA: Insufficient documentation

## 2013-07-05 DIAGNOSIS — R0609 Other forms of dyspnea: Secondary | ICD-10-CM | POA: Insufficient documentation

## 2013-07-05 MED ORDER — TECHNETIUM TC 99M SESTAMIBI GENERIC - CARDIOLITE
31.0000 | Freq: Once | INTRAVENOUS | Status: AC | PRN
  Administered 2013-07-05: 31 via INTRAVENOUS

## 2013-07-05 MED ORDER — AMINOPHYLLINE 25 MG/ML IV SOLN
75.0000 mg | Freq: Once | INTRAVENOUS | Status: AC
  Administered 2013-07-05: 75 mg via INTRAVENOUS

## 2013-07-05 MED ORDER — REGADENOSON 0.4 MG/5ML IV SOLN
0.4000 mg | Freq: Once | INTRAVENOUS | Status: AC
  Administered 2013-07-05: 0.4 mg via INTRAVENOUS

## 2013-07-05 MED ORDER — TECHNETIUM TC 99M SESTAMIBI GENERIC - CARDIOLITE
10.4000 | Freq: Once | INTRAVENOUS | Status: AC | PRN
  Administered 2013-07-05: 10 via INTRAVENOUS

## 2013-07-05 NOTE — Telephone Encounter (Signed)
Spoke with pharmacist and she ran the Diovan/HCT 320/25 through as brand and it went through. She will notify patient.

## 2013-07-05 NOTE — Procedures (Addendum)
[facility] [address] [place] [address] [phone]  Cardiology Nuclear Med Study  Suzanne [name] is a 38 y.o. female     MRN : [id number]     DOB: 07-01-1975  Procedure Date: 07/05/2013  Nuclear Med Background Indication for Stress Test:  Evaluation for Ischemia History:  Asthma Cardiac Risk Factors: Family History - CAD, Hypertension, Obesity and Smoker  Symptoms:  Chest Pain, Dizziness, DOE, Fatigue and Palpitations   Nuclear Pre-Procedure Caffeine/Decaff Intake:  1:00am NPO After: 11am   IV Site: R Antecubital  IV 0.9% NS with Angio Cath:  22g  Chest Size (in):  n/a IV Started by: Azucena Cecil, RN  Height: 5\' 4"  (1.626 m)  Cup Size: I  BMI:  Body mass index is 36.72 kg/(m^2). Weight:  214 lb (97.07 kg)   Tech Comments:  n/a    Nuclear Med Study 1 or 2 day study: 1 day  Stress Test Type:  [place] Provider:  Shelva Majestic, MD   Resting Radionuclide: Technetium 22m Sestamibi  Resting Radionuclide Dose: 10.4 mCi   Stress Radionuclide:  Technetium 9m Sestamibi  Stress Radionuclide Dose: 31.0 mCi           Stress Protocol Rest HR: 73 Stress HR: 122  Rest BP: 185/111 Stress BP: 149/107  Exercise Time (min): n/a METS: n/a   Predicted Max HR: 183 bpm % Max HR: 70.49 bpm Rate Pressure Product: 24252  Dose of Adenosine (mg):  n/a Dose of Lexiscan: 0.4 mg  Dose of Atropine (mg): n/a Dose of Dobutamine: n/a mcg/kg/min (at max HR)  Stress Test Technologist: Leane Para, CCT Nuclear Technologist: Imagene Riches, CNMT   Rest Procedure:  Myocardial perfusion imaging was performed at rest 45 minutes following the intravenous administration of Technetium 37m Sestamibi. Stress Procedure:  The patient received IV Lexiscan 0.4 mg over 15-seconds.  Technetium 61m Sestamibi injected at 30-seconds.  The patient experienced marked SOB; 75 mg of IV Aminophylline was administered with resolution of  symptoms.  There were no significant changes with Lexiscan.  Quantitative spect images were obtained after a 45 minute delay.  Transient Ischemic Dilatation (Normal <1.22):  1.07 Lung/Heart Ratio (Normal <0.45):  0.30 QGS EDV:  88 ml QGS ESV:  24 ml LV Ejection Fraction: 73%  Rest ECG: NSR - Normal EKG  Stress ECG: No significant change from baseline ECG  QPS Raw Data Images:  Normal; no motion artifact; normal heart/lung ratio. Stress Images:  Normal homogeneous uptake in all areas of the myocardium. Rest Images:  Normal homogeneous uptake in all areas of the myocardium. Subtraction (SDS):  Normal  Impression Exercise Capacity:  Lexiscan with no exercise. BP Response:  Hypertensive blood pressure response. Clinical Symptoms:  No significant symptoms noted. ECG Impression:  No significant ECG changes with Lexiscan. Comparison with Prior Nuclear Study: No previous nuclear study performed  Overall Impression:  Normal stress nuclear study.  LV Wall Motion:  NL LV Function; NL Wall Motion; EF 73%.  [name], MD, [facility] Board Certified in Nuclear Cardiology Attending Cardiologist [place], MD  07/05/2013 4:46 PM

## 2013-07-12 ENCOUNTER — Ambulatory Visit: Payer: Medicaid Other | Admitting: Internal Medicine

## 2013-07-19 LAB — COMPREHENSIVE METABOLIC PANEL
ALT: 14 U/L (ref 0–35)
AST: 13 U/L (ref 0–37)
Albumin: 4 g/dL (ref 3.5–5.2)
Alkaline Phosphatase: 76 U/L (ref 39–117)
BUN: 11 mg/dL (ref 6–23)
CO2: 30 mEq/L (ref 19–32)
Calcium: 9 mg/dL (ref 8.4–10.5)
Chloride: 99 mEq/L (ref 96–112)
Creat: 0.58 mg/dL (ref 0.50–1.10)
Glucose, Bld: 96 mg/dL (ref 70–99)
Potassium: 3.8 mEq/L (ref 3.5–5.3)
Sodium: 135 mEq/L (ref 135–145)
Total Bilirubin: 0.3 mg/dL (ref 0.2–1.2)
Total Protein: 7 g/dL (ref 6.0–8.3)

## 2013-07-19 LAB — CBC
HCT: 34.7 % — ABNORMAL LOW (ref 36.0–46.0)
Hemoglobin: 11 g/dL — ABNORMAL LOW (ref 12.0–15.0)
MCH: 22.8 pg — ABNORMAL LOW (ref 26.0–34.0)
MCHC: 31.7 g/dL (ref 30.0–36.0)
MCV: 71.8 fL — ABNORMAL LOW (ref 78.0–100.0)
Platelets: 396 10*3/uL (ref 150–400)
RBC: 4.83 MIL/uL (ref 3.87–5.11)
RDW: 16.7 % — ABNORMAL HIGH (ref 11.5–15.5)
WBC: 10.2 10*3/uL (ref 4.0–10.5)

## 2013-07-19 LAB — LIPID PANEL
Cholesterol: 165 mg/dL (ref 0–200)
HDL: 46 mg/dL (ref >39)
LDL Cholesterol: 99 mg/dL (ref 0–99)
Total CHOL/HDL Ratio: 3.6 Ratio
Triglycerides: 101 mg/dL (ref <150)
VLDL: 20 mg/dL (ref 0–40)

## 2013-07-19 LAB — TSH: TSH: 0.799 u[IU]/mL (ref 0.350–4.500)

## 2013-07-24 LAB — VITAMIN D 1,25 DIHYDROXY
Vitamin D 1, 25 (OH)2 Total: 44 pg/mL (ref 18–72)
Vitamin D2 1, 25 (OH)2: <8 pg/mL
Vitamin D3 1, 25 (OH)2: 44 pg/mL

## 2013-07-29 ENCOUNTER — Ambulatory Visit
Admission: RE | Admit: 2013-07-29 | Discharge: 2013-07-29 | Disposition: A | Discharge status: Home / Self Care | Payer: Medicaid Other | Source: Ambulatory Visit | Attending: Nurse Practitioner | Admitting: Nurse Practitioner

## 2013-07-29 ENCOUNTER — Other Ambulatory Visit: Payer: Self-pay | Admitting: Nurse Practitioner

## 2013-07-29 DIAGNOSIS — R05 Cough: Secondary | ICD-10-CM

## 2013-07-29 DIAGNOSIS — R059 Cough, unspecified: Secondary | ICD-10-CM

## 2013-08-15 ENCOUNTER — Ambulatory Visit (INDEPENDENT_AMBULATORY_CARE_PROVIDER_SITE_OTHER): Payer: Medicaid Other | Admitting: Cardiovascular Disease

## 2013-08-15 ENCOUNTER — Encounter: Payer: Self-pay | Admitting: Cardiovascular Disease

## 2013-08-15 VITALS — BP 114/72 | HR 98 | Ht 64.0 in | Wt 217.2 lb

## 2013-08-15 DIAGNOSIS — E669 Obesity, unspecified: Secondary | ICD-10-CM

## 2013-08-15 DIAGNOSIS — R079 Chest pain, unspecified: Secondary | ICD-10-CM

## 2013-08-15 DIAGNOSIS — R0602 Shortness of breath: Secondary | ICD-10-CM

## 2013-08-15 DIAGNOSIS — I10 Essential (primary) hypertension: Secondary | ICD-10-CM

## 2013-08-15 NOTE — Progress Notes (Signed)
Patient ID: Suzanne [name], female   DOB: [birth year], 38 y.o.   MRN: [id number] And a wall     HPI:  Suzanne [name] is a 38 year old African American female who presents for followup cardiology evaluation for hypertension, exertional shortness of breath and chest pain.   Suzanne [name] has a long-standing history of hypertension which initially developed at age 38. She states over the years she's been on numerous medications. Most recently, she has been on a regimen consisting of Benicar HCT 40/12.5, clonidine 0.2 mg. She states her blood pressure is always elevated per she also recently has noticed development of shortness of breath, particularly with activity. She also is noticed some chest tightness with radiation to her left arm. At times he awakes at night with palpitations. She states her shortness of breath is significant enough that at times when she goes to the grocery store she has to be in a chair and wheel around to do her grocery shopping. She does admit to a long-standing history of fibromyalgia and chronic pain. She is seen [facility] for her pain. She has been found to have mild posterior central disc protrusions at C4-5 and C5-6 without spinal stenosis or foraminal narrowing and she has mild disc bulging and facet hypertrophy with moderate bi-foraminal stenosis at L4-5 and disc bulging with no spinal stenosis or foraminal narrowing at L5-S1.  Suzanne [name] does have a history of cigarette smoking for at least 12 years. She quit cigarettes for 4 years. More recently she's been smoking cigars.  When I initially saw her 07/02/2013, she had stage II hypertension with a blood pressure 170 102. That time, I recommended she change her Benicar HCTZ 40/25 and started her and amlodipine. I scheduled her for Lexa scan Myoview study and echo as well as laboratory. She states her insurance would not cover Benicar HCT and consequently we started her on valsartan HCT 320/25 to take in  addition to the amlodipine 5 mg for blood pressure control. She states she has felt well with this. Her blood pressure at home has been stable.  A nuclear perfusion study was entirely normal which showed an ejection fraction of 73% post stress without evidence for scar or ischemia. She did have a hypertensive blood pressure response. Laboratory revealed normal renal function with a BUN of 11 crabbing 0.58. She had normal LFTs. She was mildly anemic which is chronic with a hemoglobin of 11 hematocrit of 34.7. She is consistently had microcytic indices. Platelets were normal. Lipids were very good with total cholesterol 165 triglycerides 101 HDL 46 LDL 99. She presents for evaluation.    Past Medical History  Diagnosis Date  . Asthma   . Hypertension   . Migraine   . Anemia   . Neck pain, chronic   . GERD (gastroesophageal reflux disease)   . Chronic lower back pain     Past Surgical History  Procedure Laterality Date  . Vaginal hysterectomy    . Tubal ligation    . Esophageal dilation    . Esophagogastroduodenoscopy (egd) with esophageal dilation N/A 02/28/2013    Procedure: ESOPHAGOGASTRODUODENOSCOPY (EGD) WITH ESOPHAGEAL DILATION;  Surgeon: Milus Banister, MD;  Location: [facility];  Service: Endoscopy;  Laterality: N/A;  . Esophageal manometry N/A 04/01/2013    Procedure: ESOPHAGEAL MANOMETRY (EM);  Surgeon: Milus Banister, MD;  Location: [facility];  Service: Endoscopy;  Laterality: N/A;    Allergies  Allergen Reactions  . Reglan [Metoclopramide] Shortness Of Breath  .  Lisinopril Cough  . Losartan Cough  . Amoxicillin Rash  . Contrast Media [Iodinated Diagnostic Agents] Rash    Current Outpatient Prescriptions  Medication Sig Dispense Refill  . acyclovir (ZOVIRAX) 400 MG tablet Take 400 mg by mouth 2 (two) times daily.      Marland Kitchen amitriptyline (ELAVIL) 50 MG tablet Take 50 mg by mouth at bedtime.      Marland Kitchen amLODipine (NORVASC) 5 MG tablet Take 1 tablet (5 mg total) by mouth  daily.  180 tablet  3  . omeprazole (PRILOSEC) 40 MG capsule Take 40 mg by mouth daily.      . sertraline (ZOLOFT) 100 MG tablet Take 100 mg by mouth daily.      . valsartan-hydrochlorothiazide (DIOVAN-HCT) 320-25 MG per tablet Take 1 tablet by mouth daily.       No current facility-administered medications for this visit.    Social history is notable in that she is married for 6 years. She has a 8 year old daughter and a 90 year old son. She completed 12th grade education. She is not drink alcohol. She does not typically exercise.  Family History  Problem Relation Age of Onset  . Heart disease Father   . Hypertension Mother   . Hyperlipidemia Maternal Grandmother   . Hypertension Maternal Grandmother   . Hypertension Paternal Grandmother   . Diabetes Paternal Grandmother   . Cancer - Lung Paternal Grandmother     ROS is negative for fever chills or night sweats. She admits to occasional headaches. She denies change in vision or hearing. There is no lymphadenopathy. She denies wheezing. There is no PND or orthopnea. Time she notes palpitations and did awaken from sleep with this. She does note some occasional left arm numbness. She does have fibromyalgia. She does have  Episodes of chest tightness. She admits to exertional shortness of breath. She denies change in bowel bladder habits. There is no nausea or vomiting. She denies claudication. She does note leg swelling intermittently. She denies tremors. She denies rash she does have a disc disease as noted above. She has had instances of dysphagia and underwent esophageal dilatation in 2013. Other comprehensive 14 point system review is negative.  PE BP 114/72  Pulse 98  Ht 5\' 4"  (1.626 m)  Wt 217 lb 3.2 oz (98.521 kg)  BMI 37.26 kg/m2 Repeat blood pressure is 110/70 General: Alert, oriented, no distress.  Skin: normal turgor, no rashes HEENT: Normocephalic, atraumatic. Pupils round and reactive; sclera anicteric; extraocular muscles  intact; Fundi mild arteriolar narrowing without hemorrhages or exudate Nose without nasal septal hypertrophy Mouth/Parynx benign; Mallinpatti scale 3. She did have tongue piercing. Neck: Thick neck ;No JVD, no carotid bruits; normal carotid upstroke Lungs: clear to ausculatation and percussion; no wheezing or rales Chest wall: without tenderness to palpitation Heart: RRR, s1 s2 normal 1/6 systolic murmur. No S3 or S4 gallop. Abdomen: Moderately obese; soft, nontender; no hepatosplenomehaly, BS+; abdominal aorta nontender and not dilated by palpation. Back: no CVA tenderness Pulses 2+ Extremities: no clubbinbg cyanosis or edema, Homan's sign negative  Neurologic: grossly nonfocal; Cranial nerves grossly wnl Psychologic: Normal mood and affect    Prior ECG (independently read by me): Normal sinus rhythm at 77 beats per minute. Nonspecific T changes.  LABS:  BMET    Component Value Date/Time   NA 135 07/19/2013 1138   K 3.8 07/19/2013 1138   CL 99 07/19/2013 1138   CO2 30 07/19/2013 1138   GLUCOSE 96 07/19/2013 1138   BUN 11 07/19/2013 1138  CREATININE 0.58 07/19/2013 1138   CREATININE 0.66 02/26/2013 1635   CALCIUM 9.0 07/19/2013 1138   GFRNONAA >90 02/26/2013 1635   GFRAA >90 02/26/2013 1635     Hepatic Function Panel     Component Value Date/Time   PROT 7.0 07/19/2013 1138   ALBUMIN 4.0 07/19/2013 1138   AST 13 07/19/2013 1138   ALT 14 07/19/2013 1138   ALKPHOS 76 07/19/2013 1138   BILITOT 0.3 07/19/2013 1138     CBC    Component Value Date/Time   WBC 10.2 07/19/2013 1138   RBC 4.83 07/19/2013 1138   HGB 11.0* 07/19/2013 1138   HCT 34.7* 07/19/2013 1138   PLT 396 07/19/2013 1138   MCV 71.8* 07/19/2013 1138   MCH 22.8* 07/19/2013 1138   MCHC 31.7 07/19/2013 1138   RDW 16.7* 07/19/2013 1138   LYMPHSABS 2.3 02/26/2013 1635   MONOABS 1.1* 02/26/2013 1635   EOSABS 0.2 02/26/2013 1635   BASOSABS 0.0 02/26/2013 1635     BNP No results found for this basename: probnp    Lipid Panel      Component Value Date/Time   CHOL 165 07/19/2013 1138     RADIOLOGY: No results found.   ASSESSMENT AND PLAN: Suzanne [name] is a 34 year old African American female with an 18 year history of hypertension which initially was diagnosed at age 46. When I initially saw her blood pressure was elevated at 170/102 when taken by the nurse, but when rechecked by me was 152/94. She has experienced episodes of significant shortness of breath with activity associated with some mild episodes of chest tightness. She also has noticed nocturnal palpitations. Her ARB dose was increased and she was started on valsartanHCT 320/20 and amlodipine 5 mg. Her blood pressure today is excellent at 110/70. She feels well. She's not having any palpitations. She does have issues with her fibromyalgia. Her nuclear perfusion study shows an ejection fraction of 73%. She did not have wall motion abnormalities. There was no scar or skiing. Apparently, she never had her echo Doppler study done. I did review her laboratory. Vitamin D level is normal at 44. Thyroid function was normal. Lipids were very good. She does have mild prostatic anemia. She does have heavy menses. Apparently this has been persistent for several years. She will followup with her primary M.D. I will see her in 6 months for followup cardiology evaluation.  Troy Sine, MD, [facility] 08/15/2013 9:32 AM

## 2013-08-15 NOTE — Patient Instructions (Signed)
Your physician recommends that you schedule a follow-up appointment in: 6 months. No changes were made today in your therapy. 

## 2013-08-19 ENCOUNTER — Encounter: Payer: Self-pay | Admitting: Pulmonary Disease

## 2013-08-19 ENCOUNTER — Ambulatory Visit (INDEPENDENT_AMBULATORY_CARE_PROVIDER_SITE_OTHER): Payer: Medicaid Other | Admitting: Pulmonary Disease

## 2013-08-19 VITALS — BP 122/72 | HR 96 | Ht 64.0 in | Wt 221.8 lb

## 2013-08-19 DIAGNOSIS — R0602 Shortness of breath: Secondary | ICD-10-CM

## 2013-08-19 DIAGNOSIS — R079 Chest pain, unspecified: Secondary | ICD-10-CM

## 2013-08-19 MED ORDER — BENZONATATE 100 MG PO CAPS: 100.0000 mg | ORAL_CAPSULE | Freq: Four times a day (QID) | ORAL | Status: DC | PRN

## 2013-08-19 NOTE — Progress Notes (Signed)
Subjective:    Patient ID: Suzanne [name], female    DOB: [birth year], 38 y.o.   MRN: [id number]  HPI  Suzanne [name] has been having a lot of chest pain and shortness of breath lately with cough.  Suzanne [name] she walks with grocery shopping sh is out of breath within 15 minutes or so.  This has been going on for a few months.  Recently she has been treated with steroids for asthma.    She had asthma as a child and was hospitlized twice as a child.  It gave her some trouble around age 30 after an allergic reaction and she had bronchitis and pneumonia.  After that she really didn't have much trouble until late October 2014. In the last few months her dyspnea has progressed.  She has noticed trouble breathing while supine.  She has trouble sleeping.  Her dyspnea on exertion has progressed since October.  She was seen by a physician and was prescribed steroids and proventil.  She had a chest x-ray and no breathing test. She was last treated with prednisone in January 2015 after a PCP visit.  Around that time she was started on lisinopril which made her cough.  The cough has improved since then but she still has a dry ocugh at night.  She feels a sore throat at night and has rib soreness.    She denies environmental triggers.  Exertion makes her dyspnic.  She felt herself wheezing in October but none since.  She has gained 40 pounds since October.  She has been on prednisone twice since October about three weeks total.  She has been taking sinuglair.    She denies itchy eyes, scratchy throat.  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 This was also treated with topical steroids.    She smokes 2 cigars a day.  She quit smoking cigarettes 2 years ago and quit after about 12 years of smoking a pack per day.  Past Medical History  Diagnosis Date  . Asthma   . Hypertension   . Migraine   . Anemia   . Neck pain, chronic   . GERD (gastroesophageal reflux disease)   . Chronic lower back pain       Family History  Problem Relation Age of Onset  . Heart disease Father   . Hypertension Mother   . Hyperlipidemia Maternal Grandmother   . Hypertension Maternal Grandmother   . Hypertension Paternal Grandmother   . Diabetes Paternal Grandmother   . Cancer - Lung Paternal Grandmother      History   Social History  . Marital Status: Married    Spouse Name: Suzanne [name]    Number of Children: 2  . Years of Education: College   Occupational History  . Homemaker   .  Other    [name]   Social History Main Topics  . Smoking status: Current Some Day Smoker -- 0.10 packs/day for 10 years    Types: Cigars  . Smokeless tobacco: Never Used     Comment: smokes 2 Black & Milds per day 08/19/13  . Alcohol Use: No  . Drug Use: No  . Sexual Activity: Yes    Birth Control/ Protection: Other-see comments, Surgical   Other Topics Concern  . Not on file   Social History Narrative   Patient lives at home with family.   Patient goes to [employer].   Caffeine Use 5-6 cups daily   Patient has 2  adopted children.     Allergies  Allergen Reactions  . Reglan [Metoclopramide] Shortness Of Breath  . Lisinopril Cough  . Losartan Cough  . Amoxicillin Rash  . Contrast Media [Iodinated Diagnostic Agents] Rash     Outpatient Prescriptions Prior to Visit  Medication Sig Dispense Refill  . acyclovir (ZOVIRAX) 400 MG tablet Take 400 mg by mouth 2 (two) times daily.      Marland Kitchen amitriptyline (ELAVIL) 50 MG tablet Take 50 mg by mouth at bedtime.      Marland Kitchen amLODipine (NORVASC) 5 MG tablet Take 1 tablet (5 mg total) by mouth daily.  180 tablet  3  . omeprazole (PRILOSEC) 40 MG capsule Take 40 mg by mouth daily.      . sertraline (ZOLOFT) 100 MG tablet Take 100 mg by mouth daily.      . valsartan-hydrochlorothiazide (DIOVAN-HCT) 320-25 MG per tablet Take 1 tablet by mouth daily.       No facility-administered medications prior to visit.      Review of Systems  Constitutional:  Positive for fatigue. Negative for fever and unexpected weight change.  HENT: Negative for congestion, dental problem, ear pain, nosebleeds, postnasal drip, rhinorrhea, sinus pressure, sneezing, sore throat and trouble swallowing.   Eyes: Negative for redness and itching.  Respiratory: Positive for cough, chest tightness and shortness of breath. Negative for wheezing.   Cardiovascular: Negative for palpitations and leg swelling.  Gastrointestinal: Negative for nausea and vomiting.  Genitourinary: Negative for dysuria.  Musculoskeletal: Negative for joint swelling.  Skin: Negative for rash.  Neurological: Negative for headaches.  Hematological: Does not bruise/bleed easily.  Psychiatric/Behavioral: Negative for dysphoric mood. The patient is not nervous/anxious.        Objective:   Physical Exam  Filed Vitals:   08/19/13 1055  BP: 122/72  Pulse: 96  Height: 5\' 4"  (1.626 m)  Weight: 221 lb 12.8 oz (100.608 kg)  SpO2: 100%  RA  Gen: well appearing, no acute distress HEENT: NCAT, PERRL, EOMi, OP clear, neck supple without masses PULM: Clear to auscultation bilaterally CV: RRR, no mgr, no JVD AB: BS+, soft, nontender, no hsm Ext: warm, no edema, no clubbing, no cyanosis Derm: no rash or skin breakdown Neuro: A&Ox4, CN II-XII intact, strength 5/5 in all 4 extremities       Assessment & Plan:   Exertional shortness of breath I am concerned that asthma is starting to cause more shortness of breath and her once again. She had this as a child and is not uncommon for relatively young adults to develop asthma again. Certainly, her smoking is contributing to this problem. There is nothing on the recent chest CT which makes me concerned about a pulmonary parenchymal process other than asthma.   Musculoskeletal pain could be contributing to the chest pain she is having which leads to mild splinting and shortness of breath.   The 40 pound weight gain is also contributing and is  complicated by some deconditioning.  Plan: - Exercise regularly and lose weight -Full pulmonary function test -Advair twice a day, samples given of 250/50 -Continue when necessary albuterol -Exercise regularly and try to lose weight  Chest pain This sounds musculoskeletal in nature. I am encouraged by the fact that she had a negative stress test recently. We can check a echocardiogram to complete the workup for a cardiac cause of dyspnea as well as shortness of breath. In general, the pain sounds musculoskeletal so I have recommended that she treat this with over-the-counter nonsteroidal  anti-inflammatory medicines.    Updated Medication List Outpatient Encounter Prescriptions as of 08/19/2013  Medication Sig  . acyclovir (ZOVIRAX) 400 MG tablet Take 400 mg by mouth 2 (two) times daily.  Marland Kitchen amitriptyline (ELAVIL) 50 MG tablet Take 50 mg by mouth at bedtime.  Marland Kitchen amLODipine (NORVASC) 5 MG tablet Take 1 tablet (5 mg total) by mouth daily.  Marland Kitchen omeprazole (PRILOSEC) 40 MG capsule Take 40 mg by mouth daily.  . sertraline (ZOLOFT) 100 MG tablet Take 100 mg by mouth daily.  . valsartan-hydrochlorothiazide (DIOVAN-HCT) 320-25 MG per tablet Take 1 tablet by mouth daily.  . benzonatate (TESSALON) 100 MG capsule Take 1 capsule (100 mg total) by mouth every 6 (six) hours as needed for cough.

## 2013-08-19 NOTE — Patient Instructions (Addendum)
Take the Advair twice a day no matter how you feel, call us if you feel like it is helping so we can call in a prescription  We will arrange a lung function test and echocardiogram for your shortness of breath  Use ibuprofen or aleve as needed for the chest soreness  Exercise regularly and try to lose weight  Quit smoking  Use the tessalon three times per day to help suppress the cough;   You can all take delsym two tsp every 12 hours. Swallowing water or using ice chips/non mint and menthol containing candies (such as lifesavers or sugarless jolly ranchers) are also effective. You should rest your voice and avoid activities that you know make you cough for three days.  Don't clear your throat during this time.   We will see you back in 3 months or sooner if needed

## 2013-08-20 NOTE — Assessment & Plan Note (Addendum)
I am concerned that asthma is starting to cause more shortness of breath and her once again. She had this as a child and is not uncommon for relatively young adults to develop asthma again. Certainly, her smoking is contributing to this problem. There is nothing on the recent chest CT which makes me concerned about a pulmonary parenchymal process other than asthma.   Musculoskeletal pain could be contributing to the chest pain she is having which leads to mild splinting and shortness of breath.   The 40 pound weight gain is also contributing and is complicated by some deconditioning.  Plan: - Exercise regularly and lose weight -Full pulmonary function test -Advair twice a day, samples given of 250/50 -Continue when necessary albuterol -Exercise regularly and try to lose weight

## 2013-08-20 NOTE — Assessment & Plan Note (Signed)
This sounds musculoskeletal in nature. I am encouraged by the fact that she had a negative stress test recently. We can check a echocardiogram to complete the workup for a cardiac cause of dyspnea as well as shortness of breath. In general, the pain sounds musculoskeletal so I have recommended that she treat this with over-the-counter nonsteroidal anti-inflammatory medicines.

## 2013-09-03 ENCOUNTER — Other Ambulatory Visit (HOSPITAL_COMMUNITY): Payer: Medicaid Other

## 2013-09-10 ENCOUNTER — Encounter: Payer: Self-pay | Admitting: Cardiology

## 2013-09-10 ENCOUNTER — Ambulatory Visit (HOSPITAL_COMMUNITY): Payer: Medicaid Other | Attending: Cardiology | Admitting: Radiology

## 2013-09-10 DIAGNOSIS — R0602 Shortness of breath: Secondary | ICD-10-CM

## 2013-09-10 DIAGNOSIS — R072 Precordial pain: Secondary | ICD-10-CM

## 2013-09-10 NOTE — Progress Notes (Signed)
Echocardiogram Performed. 

## 2013-09-11 ENCOUNTER — Encounter: Payer: Self-pay | Admitting: Pulmonary Disease

## 2013-09-12 ENCOUNTER — Telehealth: Payer: Self-pay | Admitting: Pulmonary Disease

## 2013-09-12 NOTE — Telephone Encounter (Signed)
Pt called back and she is aware of results per BQ.  Nothing further is needed.

## 2013-09-12 NOTE — Telephone Encounter (Signed)
lmtcb X1 

## 2013-09-12 NOTE — Telephone Encounter (Signed)
Pt returning call.Suzanne [name] ° °

## 2013-09-12 NOTE — Telephone Encounter (Addendum)
lmtcb X2  Patient needs to be informed of below:  Notes Recorded by Juanito Doom, MD on 09/11/2013 at 2:23 PM A,  Please let her know that this study looked for the most part normal. She had mild thickening of her heart, but overall things looked good.  Thanks B

## 2014-02-19 ENCOUNTER — Encounter (HOSPITAL_COMMUNITY): Payer: Self-pay | Admitting: Emergency Medicine

## 2014-02-19 ENCOUNTER — Emergency Department (HOSPITAL_COMMUNITY)
Admission: EM | Admit: 2014-02-19 | Discharge: 2014-02-19 | Disposition: A | Discharge status: Home / Self Care | Payer: Medicaid Other | Source: Home / Self Care | Attending: Emergency Medicine | Admitting: Emergency Medicine

## 2014-02-19 ENCOUNTER — Other Ambulatory Visit (HOSPITAL_COMMUNITY)
Admission: RE | Admit: 2014-02-19 | Discharge: 2014-02-19 | Disposition: A | Discharge status: Home / Self Care | Payer: Medicaid Other | Source: Ambulatory Visit | Attending: Emergency Medicine | Admitting: Emergency Medicine

## 2014-02-19 DIAGNOSIS — N760 Acute vaginitis: Secondary | ICD-10-CM | POA: Insufficient documentation

## 2014-02-19 DIAGNOSIS — N390 Urinary tract infection, site not specified: Secondary | ICD-10-CM

## 2014-02-19 DIAGNOSIS — Z113 Encounter for screening for infections with a predominantly sexual mode of transmission: Secondary | ICD-10-CM | POA: Insufficient documentation

## 2014-02-19 LAB — POCT URINALYSIS DIP (DEVICE)
Bilirubin Urine: NEGATIVE
Glucose, UA: NEGATIVE mg/dL
Ketones, ur: NEGATIVE mg/dL
Leukocytes, UA: NEGATIVE
Nitrite: NEGATIVE
Protein, ur: NEGATIVE mg/dL
Specific Gravity, Urine: 1.01 (ref 1.005–1.030)
Urobilinogen, UA: 0.2 mg/dL (ref 0.0–1.0)
pH: 7 (ref 5.0–8.0)

## 2014-02-19 LAB — CERVICOVAGINAL ANCILLARY ONLY
Wet Prep (BD Affirm): NEGATIVE
Wet Prep (BD Affirm): NEGATIVE
Wet Prep (BD Affirm): POSITIVE — AB

## 2014-02-19 MED ORDER — NITROFURANTOIN MONOHYD MACRO 100 MG PO CAPS: 100.0000 mg | ORAL_CAPSULE | Freq: Two times a day (BID) | ORAL | Status: DC

## 2014-02-19 MED ORDER — SULFAMETHOXAZOLE-TMP DS 800-160 MG PO TABS: 1.0000 | ORAL_TABLET | Freq: Two times a day (BID) | ORAL | Status: DC

## 2014-02-19 MED ORDER — FLUCONAZOLE 150 MG PO TABS: 150.0000 mg | ORAL_TABLET | Freq: Once | ORAL | Status: DC

## 2014-02-19 NOTE — ED Notes (Signed)
Call back number for lab issues verified at release  

## 2014-02-19 NOTE — ED Provider Notes (Signed)
Chief Complaint   Back Pain   History of Present Illness   Suzanne [name] is a 38 year old female who presents today with back pain, urinary symptoms, and vaginal discharge. The back pain has been going on for a week. She denies any back injury. It's mid to lower back and midline with radiation towards the left and the pain is worse when she urinates. Is also worse with bending and twisting and lifting. She denies any radiation down the legs. She has chronic numbness and tingling in the arms and legs. She denies any muscle weakness. There's no bladder or bowel dysfunction or saddle anesthesia. She's also having some dysuria, urinary frequency, and urgency. She denies hematuria. She had a urinary tract infection about 2 years ago. No fever, chills, nausea, vomiting, or abdominal pain. She's had fibromyalgia diagnosed about 8 months ago. She notes generalized aching and numbness of her arms and legs. She takes oxycodone for the pain and amitriptyline. She also has some vaginal discharge and itching. She's on clindamycin, so she thinks this might be a yeast infection. She denies any odor.  Review of Systems   Other than as noted above, the patient denies any of the following symptoms: Systemic:  No fever or chills GI:  No abdominal pain, nausea, vomiting, diarrhea, constipation, melena or hematochezia. GU:  No dysuria, frequency, urgency, hematuria, vaginal discharge, itching, or abnormal vaginal bleeding.  [place]   Past medical history, family history, social history, meds, and allergies were reviewed.  She is allergic to contrast dye, Reglan, Cipro, penicillin, and sulfa.  Physical Examination    Vital signs:  BP 159/80  Pulse 79  Temp(Src) 98.2 F (36.8 C) (Oral)  Resp 16  SpO2 100% General:  Alert, oriented and in no distress. Lungs:  Breath sounds clear and equal bilaterally.  No wheezes, rales or rhonchi. Heart:  Regular rhythm.  No gallops or murmers. Abdomen:  Soft, flat and  non-distended.  No organomegaly or mass.  No tenderness, guarding or rebound.  Bowel sounds normally active. Pelvic exam:  Normal external genitalia. There was a small amount of whitish discharge which was non-malodorous. Cervix and vaginal mucosa were normal. There was mild pain on cervical motion. Uterus was normal in size and shape with mild tenderness. She has mild bilateral adnexal tenderness without a mass.  DNA probes for gonorrhea, Chlamydia, Trichomonas, Gardnerella, Candida were obtained. Back: She has bilateral paravertebral pain to palpation in the mid to lower lumbar spine and also midline pain to palpation in her back had a slightly diminished range of motion with 60 of forward bending, 10 of backward bending, 20 of lateral bending, and 30 of rotation with pain. Straight leg raising was negative. Neurological: Normal muscle strength, sensation, DTRs and lower extremities. Extremities: No edema, pulses were full. Skin:  Clear, warm and dry.  Chaperoned by Ninfa Linden, RN who was present throughout the pelvic exam.   Labs   Results for orders placed during the hospital encounter of 02/19/14  POCT URINALYSIS DIP (DEVICE)      Result Value Ref Range   Glucose, UA NEGATIVE  NEGATIVE mg/dL   Bilirubin Urine NEGATIVE  NEGATIVE   Ketones, ur NEGATIVE  NEGATIVE mg/dL   Specific Gravity, Urine 1.010  1.005 - 1.030   Hgb urine dipstick TRACE (*) NEGATIVE   pH 7.0  5.0 - 8.0   Protein, ur NEGATIVE  NEGATIVE mg/dL   Urobilinogen, UA 0.2  0.0 - 1.0 mg/dL   Nitrite NEGATIVE  NEGATIVE  Leukocytes, UA NEGATIVE  NEGATIVE    The urine was cultured.  Assessment   The primary encounter diagnosis was UTI (lower urinary tract infection). A diagnosis of Vaginitis was also pertinent to this visit.  Lower back pain may be due to urinary tract infections, since this is worse with urination and she's having lots of urinary symptoms. Alternatively it could be musculoskeletal such as muscle  strain, arthritis, fibromyalgia, or degenerative disc disease. The vaginal infection appears to be yeast related. DNA probes are pending and I will call her if anything else shows up.       Plan    1.  Meds:  The following meds were prescribed:   New Prescriptions   FLUCONAZOLE (DIFLUCAN) 150 MG TABLET    Take 1 tablet (150 mg total) by mouth once.   NITROFURANTOIN, MACROCRYSTAL-MONOHYDRATE, (MACROBID) 100 MG CAPSULE    Take 1 capsule (100 mg total) by mouth 2 (two) times daily.    2.  Patient Education/Counseling:  The patient was given appropriate handouts, self care instructions, and instructed in symptomatic relief.    3.  Follow up:  The patient was told to follow up here if no better in 3 to 4 days, or sooner if becoming worse in any way, and given some red flag symptoms such as worsening pain, fever, persistent vomiting, or heavy vaginal bleeding which would prompt immediate return.       Harden Mo, MD 02/19/14 1330

## 2014-02-19 NOTE — Discharge Instructions (Signed)

## 2014-02-19 NOTE — ED Notes (Signed)
Reported history of fibromyalgia , which she states is causing her back to hurt worse than usual past few days. also concerned for probable yeast infection, as she is on clindamycin for a bad tooth, and takes frequent tub baths. Denies ANY  STD concerns

## 2014-02-20 LAB — URINE CULTURE
Colony Count: 4000
Special Requests: NORMAL

## 2014-02-20 LAB — CERVICOVAGINAL ANCILLARY ONLY
Chlamydia: NEGATIVE
Neisseria Gonorrhea: NEGATIVE

## 2014-02-22 ENCOUNTER — Telehealth (HOSPITAL_COMMUNITY): Payer: Self-pay | Admitting: Emergency Medicine

## 2014-02-22 MED ORDER — METRONIDAZOLE 500 MG PO TABS: 500.0000 mg | ORAL_TABLET | Freq: Two times a day (BID) | ORAL | Status: DC

## 2014-02-22 NOTE — ED Notes (Signed)
The patient's DNA probe came back positive for Gardnerella. She will need metronidazole 500 mg, #14, 1 twice a day for one week. This will be sent to her pharmacy, the [facility] on [address] We will need to call her and let her know these results.   Harden Mo, MD 02/22/14 2019

## 2014-02-24 ENCOUNTER — Telehealth (HOSPITAL_COMMUNITY): Payer: Self-pay | Admitting: *Deleted

## 2014-02-24 NOTE — ED Notes (Signed)
GC/Chlamydia neg., Affirm: Candida, and Trich neg., Gardnerella pos., Urine culture: 4,000 colonies insignificant growth.  Dr. Jake Michaelis sent Rx. of Flagyl to pt.'s pharmacy.  I called pt. Pt. verified x 2 and given results.  Pt. told where to pick up her Rx.   Pt. instructed to no alcohol while taking this medication.  Pt. asked about the other medication. I told her that it was for a UTI, but the culture was neg. I asked if her symptoms were better. She said no, she still feels pressure. I told her to take all of the Flagyl and finish her Macrobid and that should clear up her symptoms. Pt. voiced understanding. Roselyn Meier 02/24/2014

## 2014-03-31 ENCOUNTER — Encounter (HOSPITAL_COMMUNITY): Payer: Self-pay | Admitting: Emergency Medicine

## 2014-05-09 ENCOUNTER — Other Ambulatory Visit: Payer: Self-pay | Admitting: *Deleted

## 2014-05-09 MED ORDER — VALSARTAN-HYDROCHLOROTHIAZIDE 320-25 MG PO TABS: 1.0000 | ORAL_TABLET | Freq: Every day | ORAL | Status: DC

## 2014-05-14 ENCOUNTER — Telehealth: Payer: Self-pay | Admitting: *Deleted

## 2014-05-14 NOTE — Telephone Encounter (Signed)
Prior auth for diovan-hct complete. #[id number]

## 2014-07-21 ENCOUNTER — Emergency Department (HOSPITAL_COMMUNITY): Payer: Medicaid Other

## 2014-07-21 ENCOUNTER — Emergency Department (HOSPITAL_COMMUNITY)
Admission: EM | Admit: 2014-07-21 | Discharge: 2014-07-21 | Disposition: A | Discharge status: Home / Self Care | Payer: Medicaid Other | Attending: Emergency Medicine | Admitting: Emergency Medicine

## 2014-07-21 ENCOUNTER — Encounter (HOSPITAL_COMMUNITY): Payer: Self-pay | Admitting: Emergency Medicine

## 2014-07-21 DIAGNOSIS — J45901 Unspecified asthma with (acute) exacerbation: Secondary | ICD-10-CM | POA: Diagnosis not present

## 2014-07-21 DIAGNOSIS — K219 Gastro-esophageal reflux disease without esophagitis: Secondary | ICD-10-CM | POA: Diagnosis not present

## 2014-07-21 DIAGNOSIS — Z792 Long term (current) use of antibiotics: Secondary | ICD-10-CM | POA: Diagnosis not present

## 2014-07-21 DIAGNOSIS — Z862 Personal history of diseases of the blood and blood-forming organs and certain disorders involving the immune mechanism: Secondary | ICD-10-CM | POA: Insufficient documentation

## 2014-07-21 DIAGNOSIS — I10 Essential (primary) hypertension: Secondary | ICD-10-CM | POA: Diagnosis not present

## 2014-07-21 DIAGNOSIS — G8929 Other chronic pain: Secondary | ICD-10-CM | POA: Insufficient documentation

## 2014-07-21 DIAGNOSIS — R0602 Shortness of breath: Secondary | ICD-10-CM | POA: Diagnosis present

## 2014-07-21 DIAGNOSIS — R6889 Other general symptoms and signs: Secondary | ICD-10-CM

## 2014-07-21 DIAGNOSIS — G43909 Migraine, unspecified, not intractable, without status migrainosus: Secondary | ICD-10-CM | POA: Insufficient documentation

## 2014-07-21 DIAGNOSIS — Z880 Allergy status to penicillin: Secondary | ICD-10-CM | POA: Insufficient documentation

## 2014-07-21 DIAGNOSIS — Z720 Tobacco use: Secondary | ICD-10-CM | POA: Diagnosis not present

## 2014-07-21 DIAGNOSIS — Z79899 Other long term (current) drug therapy: Secondary | ICD-10-CM | POA: Insufficient documentation

## 2014-07-21 LAB — BASIC METABOLIC PANEL
Anion gap: 6 (ref 5–15)
BUN: <5 mg/dL — ABNORMAL LOW (ref 6–23)
CO2: 25 mmol/L (ref 19–32)
Calcium: 8.5 mg/dL (ref 8.4–10.5)
Chloride: 102 mmol/L (ref 96–112)
Creatinine, Ser: 0.61 mg/dL (ref 0.50–1.10)
GFR calc Af Amer: >90 mL/min (ref >90)
GFR calc non Af Amer: >90 mL/min (ref >90)
Glucose, Bld: 93 mg/dL (ref 70–99)
Potassium: 3 mmol/L — ABNORMAL LOW (ref 3.5–5.1)
Sodium: 133 mmol/L — ABNORMAL LOW (ref 135–145)

## 2014-07-21 LAB — CBC WITH DIFFERENTIAL/PLATELET
Basophils Absolute: 0 10*3/uL (ref 0.0–0.1)
Basophils Relative: 0 % (ref 0–1)
Eosinophils Absolute: 0.1 10*3/uL (ref 0.0–0.7)
Eosinophils Relative: 1 % (ref 0–5)
HCT: 30.2 % — ABNORMAL LOW (ref 36.0–46.0)
Hemoglobin: 9.7 g/dL — ABNORMAL LOW (ref 12.0–15.0)
Lymphocytes Relative: 7 % — ABNORMAL LOW (ref 12–46)
Lymphs Abs: 0.7 10*3/uL (ref 0.7–4.0)
MCH: 22.3 pg — ABNORMAL LOW (ref 26.0–34.0)
MCHC: 32.1 g/dL (ref 30.0–36.0)
MCV: 69.4 fL — ABNORMAL LOW (ref 78.0–100.0)
Monocytes Absolute: 1.1 10*3/uL — ABNORMAL HIGH (ref 0.1–1.0)
Monocytes Relative: 10 % (ref 3–12)
Neutro Abs: 8.6 10*3/uL — ABNORMAL HIGH (ref 1.7–7.7)
Neutrophils Relative %: 82 % — ABNORMAL HIGH (ref 43–77)
Platelets: 330 10*3/uL (ref 150–400)
RBC: 4.35 MIL/uL (ref 3.87–5.11)
RDW: 15.3 % (ref 11.5–15.5)
WBC: 10.5 10*3/uL (ref 4.0–10.5)

## 2014-07-21 LAB — I-STAT TROPONIN, ED: Troponin i, poc: 0 ng/mL (ref 0.00–0.08)

## 2014-07-21 MED ORDER — PREDNISONE 20 MG PO TABS: 40.0000 mg | ORAL_TABLET | Freq: Every day | ORAL | Status: DC

## 2014-07-21 MED ORDER — HYDROMORPHONE HCL 1 MG/ML IJ SOLN
1.0000 mg | Freq: Once | INTRAMUSCULAR | Status: AC
  Administered 2014-07-21: 1 mg via INTRAVENOUS
  Filled 2014-07-21: qty 1

## 2014-07-21 MED ORDER — SODIUM CHLORIDE 0.9 % IV BOLUS (SEPSIS)
1000.0000 mL | Freq: Once | INTRAVENOUS | Status: AC
  Administered 2014-07-21: 1000 mL via INTRAVENOUS

## 2014-07-21 MED ORDER — ACETAMINOPHEN 325 MG PO TABS
650.0000 mg | ORAL_TABLET | Freq: Once | ORAL | Status: AC
  Administered 2014-07-21: 650 mg via ORAL
  Filled 2014-07-21: qty 2

## 2014-07-21 MED ORDER — MORPHINE SULFATE 4 MG/ML IJ SOLN
4.0000 mg | Freq: Once | INTRAMUSCULAR | Status: AC
Start: 2014-07-21 — End: 2014-07-21
  Administered 2014-07-21: 4 mg via INTRAVENOUS
  Filled 2014-07-21: qty 1

## 2014-07-21 MED ORDER — ONDANSETRON HCL 4 MG/2ML IJ SOLN
4.0000 mg | Freq: Once | INTRAMUSCULAR | Status: AC
  Administered 2014-07-21: 4 mg via INTRAVENOUS
  Filled 2014-07-21: qty 2

## 2014-07-21 MED ORDER — ALBUTEROL SULFATE HFA 108 (90 BASE) MCG/ACT IN AERS
1.0000 | INHALATION_SPRAY | Freq: Once | RESPIRATORY_TRACT | Status: AC
  Administered 2014-07-21: 2 via RESPIRATORY_TRACT
  Filled 2014-07-21: qty 6.7

## 2014-07-21 MED ORDER — KETOROLAC TROMETHAMINE 30 MG/ML IJ SOLN
30.0000 mg | Freq: Once | INTRAMUSCULAR | Status: AC
  Administered 2014-07-21: 30 mg via INTRAVENOUS
  Filled 2014-07-21: qty 1

## 2014-07-21 MED ORDER — ALBUTEROL SULFATE (2.5 MG/3ML) 0.083% IN NEBU
5.0000 mg | INHALATION_SOLUTION | Freq: Once | RESPIRATORY_TRACT | Status: AC
  Administered 2014-07-21: 5 mg via RESPIRATORY_TRACT
  Filled 2014-07-21: qty 6

## 2014-07-21 MED ORDER — IPRATROPIUM-ALBUTEROL 0.5-2.5 (3) MG/3ML IN SOLN
RESPIRATORY_TRACT | Status: AC
  Filled 2014-07-21: qty 3

## 2014-07-21 MED ORDER — IPRATROPIUM-ALBUTEROL 0.5-2.5 (3) MG/3ML IN SOLN
3.0000 mL | Freq: Once | RESPIRATORY_TRACT | Status: AC
  Administered 2014-07-21: 3 mL via RESPIRATORY_TRACT

## 2014-07-21 NOTE — ED Provider Notes (Signed)
CSN: [id number]     Arrival date & time 07/21/14  0253 History   First MD Initiated Contact with Patient 07/21/14 [phone]     Chief Complaint  Patient presents with  . Chest Pain  . Shortness of Breath  . Generalized Body Aches     (Consider location/radiation/quality/duration/timing/severity/associated sxs/prior Treatment) Patient is a 39 y.o. female presenting with shortness of breath. The history is provided by the patient. No language interpreter was used.  Shortness of Breath Severity:  Severe Onset quality:  Gradual Duration:  5 hours Timing:  Constant Progression:  Unchanged Chronicity:  Recurrent Context: not activity, not animal exposure, not emotional upset, not known allergens, not occupational exposure, not pollens, not smoke exposure, not strong odors, not URI and not weather changes   Context comment:  Asthma Relieved by:  Nothing Worsened by:  Nothing tried Ineffective treatments:  Lying down, position changes and inhaler Associated symptoms: chest pain   Associated symptoms: no abdominal pain, no fever, no neck pain and no vomiting   Chest pain:    Chest pain quality: tightness.   Severity:  Moderate   Onset quality:  Gradual   Duration:  5 hours   Timing:  Constant   Progression:  Unchanged   Chronicity:  Recurrent Risk factors: no recent alcohol use, no family hx of DVT, no hx of cancer, no hx of PE/DVT, no obesity, no oral contraceptive use, no prolonged immobilization, no recent surgery and no tobacco use     Past Medical History  Diagnosis Date  . Asthma   . Hypertension   . Migraine   . Anemia   . Neck pain, chronic   . GERD (gastroesophageal reflux disease)   . Chronic lower back pain    Past Surgical History  Procedure Laterality Date  . Vaginal hysterectomy    . Tubal ligation    . Esophageal dilation    . Esophagogastroduodenoscopy (egd) with esophageal dilation N/A 02/28/2013    Procedure: ESOPHAGOGASTRODUODENOSCOPY (EGD) WITH ESOPHAGEAL  DILATION;  Surgeon: Milus Banister, MD;  Location: [facility];  Service: Endoscopy;  Laterality: N/A;  . Esophageal manometry N/A 04/01/2013    Procedure: ESOPHAGEAL MANOMETRY (EM);  Surgeon: Milus Banister, MD;  Location: [facility];  Service: Endoscopy;  Laterality: N/A;   Family History  Problem Relation Age of Onset  . Heart disease Father   . Hypertension Mother   . Hyperlipidemia Maternal Grandmother   . Hypertension Maternal Grandmother   . Hypertension Paternal Grandmother   . Diabetes Paternal Grandmother   . Cancer - Lung Paternal Grandmother    History  Substance Use Topics  . Smoking status: Current Some Day Smoker -- 0.10 packs/day for 10 years    Types: Cigars  . Smokeless tobacco: Never Used     Comment: smokes 2 Black & Milds per day 08/19/13  . Alcohol Use: No   OB History    Gravida Para Term Preterm AB TAB SAB Ectopic Multiple Living   2 2  2            Review of Systems  Constitutional: Negative for fever, chills and fatigue.  HENT: Negative for trouble swallowing.   Eyes: Negative for visual disturbance.  Respiratory: Positive for shortness of breath.   Cardiovascular: Positive for chest pain. Negative for palpitations.  Gastrointestinal: Negative for nausea, vomiting, abdominal pain and diarrhea.  Genitourinary: Negative for dysuria and difficulty urinating.  Musculoskeletal: Positive for myalgias. Negative for arthralgias and neck pain.  Skin: Negative for  color change.  Neurological: Negative for dizziness and weakness.  Psychiatric/Behavioral: Negative for dysphoric mood.      Allergies  Reglan; Lisinopril; Losartan; Sulfa antibiotics; Amoxicillin; and Contrast media  Home Medications   Prior to Admission medications   Medication Sig Start Date End Date Taking? Authorizing Provider  acyclovir (ZOVIRAX) 400 MG tablet Take 400 mg by mouth 2 (two) times daily.    Historical Provider, MD  amitriptyline (ELAVIL) 50 MG tablet Take 50 mg by  mouth at bedtime.    Historical Provider, MD  amLODipine (NORVASC) 5 MG tablet Take 1 tablet (5 mg total) by mouth daily. 07/02/13   Troy Sine, MD  benzonatate (TESSALON) 100 MG capsule Take 1 capsule (100 mg total) by mouth every 6 (six) hours as needed for cough. 08/19/13   Juanito Doom, MD  clindamycin (CLEOCIN) 150 MG capsule Take by mouth 3 (three) times daily.    Historical Provider, MD  fluconazole (DIFLUCAN) 150 MG tablet Take 1 tablet (150 mg total) by mouth once. 02/19/14   Harden Mo, MD  metroNIDAZOLE (FLAGYL) 500 MG tablet Take 1 tablet (500 mg total) by mouth 2 (two) times daily. 02/22/14   Harden Mo, MD  nitrofurantoin, macrocrystal-monohydrate, (MACROBID) 100 MG capsule Take 1 capsule (100 mg total) by mouth 2 (two) times daily. 02/19/14   Harden Mo, MD  omeprazole (PRILOSEC) 40 MG capsule Take 40 mg by mouth daily.    Historical Provider, MD  sertraline (ZOLOFT) 100 MG tablet Take 100 mg by mouth daily.    Historical Provider, MD  valsartan-hydrochlorothiazide (DIOVAN-HCT) 320-25 MG per tablet Take 1 tablet by mouth daily. 05/09/14   Troy Sine, MD   BP 163/116 mmHg  Pulse 121  Temp(Src) 103.6 F (39.8 C) (Oral)  Resp 31  SpO2 99% Physical Exam  Constitutional: She is oriented to person, place, and time. She appears well-developed and well-nourished. No distress.  HENT:  Head: Normocephalic and atraumatic.  Eyes: Conjunctivae and EOM are normal.  Neck: Normal range of motion.  Cardiovascular: Normal rate and regular rhythm.  Exam reveals no gallop and no friction rub.   No murmur heard. Pulmonary/Chest:  Increased breathing effort. Inspiratory wheezing noted in bilateral lung fields.   Abdominal: Soft. She exhibits no distension. There is no tenderness. There is no rebound.  Musculoskeletal: Normal range of motion.  Neurological: She is alert and oriented to person, place, and time. Coordination normal.  Speech is goal-oriented. Moves limbs without  ataxia.   Skin: Skin is warm and dry.  Psychiatric: She has a normal mood and affect. Her behavior is normal.  Nursing note and vitals reviewed.   ED Course  Procedures (including critical care time) Labs Review Labs Reviewed  CBC WITH DIFFERENTIAL/PLATELET - Abnormal; Notable for the following:    Hemoglobin 9.7 (*)    HCT 30.2 (*)    MCV 69.4 (*)    MCH 22.3 (*)    Neutrophils Relative % 82 (*)    Lymphocytes Relative 7 (*)    Neutro Abs 8.6 (*)    Monocytes Absolute 1.1 (*)    All other components within normal limits  BASIC METABOLIC PANEL - Abnormal; Notable for the following:    Sodium 133 (*)    Potassium 3.0 (*)    BUN <5 (*)    All other components within normal limits  Randolm Idol, ED    Imaging Review Dg Chest 2 View  07/21/2014   CLINICAL DATA:  Chest  pain and shortness of breath.  EXAM: CHEST  2 VIEW  COMPARISON:  07/29/2013  FINDINGS: The cardiomediastinal contours are normal. The lungs are clear. Pulmonary vasculature is normal. No consolidation, pleural effusion, or pneumothorax. No acute osseous abnormalities are seen.  IMPRESSION: No acute pulmonary process.   Electronically Signed   By: Jeb Levering M.D.   On: 07/21/2014 03:47     EKG Interpretation   Date/Time:  Monday July 21 2014 03:02:18 EST Ventricular Rate:  113 PR Interval:  162 QRS Duration: 73 QT Interval:  342 QTC Calculation: 469 R Axis:   65 Text Interpretation:  Sinus tachycardia Baseline wander in lead(s) II No  significant change since last tracing Confirmed by Christy Gentles  MD, DONALD  [phone]) on 07/21/2014 3:11:30 AM      MDM   Final diagnoses:  SOB (shortness of breath)  Flu-like symptoms  Asthma, unspecified asthma severity, with acute exacerbation    3:52 AM Chest xray shows no acute changes. Labs pending. Patient febrile at 103.6 and tachycardic at 121.   6:13 AM Patient's symptoms have improved since being in the ED. Patient given albuterol nebulizer,  dilaudid and morphine. Labs, EKG and troponin unremarkable for acute changes. Patient has flu like illness and wil be treated for asthma exacerbation with prednisone taper. No further evaluation needed at this time. Patient instructed to return with worsening or concerning symptoms.     Alvina Chou, PA-C 07/24/14 0110  Sharyon Cable, MD 07/24/14 [phone]

## 2014-07-21 NOTE — ED Provider Notes (Signed)
Patient seen/examined in the Emergency Department in conjunction with Midlevel Provider  Patient reports cough/sob/fever Exam : awake/alert, she appears anxious, no wheezing noted (exam after she was given albuterol) Plan: appropriate for d/c home, likely flu like illness     Sharyon Cable, MD 07/21/14 0422

## 2014-07-21 NOTE — ED Notes (Signed)
Pt c/o chest and epigastric pain. Reports no relief with morphine; however, breathing treatment eased pain. Pt tachypneic with labored breathing. Speaking in short sentences

## 2014-07-21 NOTE — Discharge Instructions (Signed)
Take prednisone as directed until gone. Refer to attached documents for more information. Return to the ED with worsening or concerning symptoms.

## 2014-07-21 NOTE — ED Notes (Signed)
Pt reports generalized body aches on set yesterday, and chest pain and SOB that started last night. Pt reports asthma. Pt is tearful at this time, and restless.

## 2014-10-17 ENCOUNTER — Other Ambulatory Visit: Payer: Self-pay | Admitting: Cardiovascular Disease

## 2014-10-23 ENCOUNTER — Ambulatory Visit (INDEPENDENT_AMBULATORY_CARE_PROVIDER_SITE_OTHER): Payer: Medicaid Other | Admitting: Physician Assistant

## 2014-10-23 ENCOUNTER — Encounter: Payer: Self-pay | Admitting: Physician Assistant

## 2014-10-23 VITALS — BP 138/84 | HR 88 | Ht 64.0 in | Wt 207.0 lb

## 2014-10-23 DIAGNOSIS — K219 Gastro-esophageal reflux disease without esophagitis: Secondary | ICD-10-CM

## 2014-10-23 DIAGNOSIS — R1310 Dysphagia, unspecified: Secondary | ICD-10-CM

## 2014-10-23 MED ORDER — LANSOPRAZOLE 30 MG PO CPDR: DELAYED_RELEASE_CAPSULE | ORAL | Status: DC

## 2014-10-23 MED ORDER — HYOSCYAMINE SULFATE 0.125 MG SL SUBL: 0.1250 mg | SUBLINGUAL_TABLET | Freq: Three times a day (TID) | SUBLINGUAL | Status: DC | PRN

## 2014-10-23 NOTE — Progress Notes (Signed)
Patient ID: Suzanne [name], female   DOB: Jul 30, 1975, 39 y.o.   MRN: [id number]     Review of pertinent gastrointestinal problems:  1. dysphagia; had EGD with 83mm balloon dilation 11/2011 for dysphagia, mild GE junction narrowing with improvement of her symptoms after dilation. 02/2013 abd pain to er, also dysphagia. Repeat EGD 02/2013 Ardis Hughs again found slightly narrow, smooth GE junction, dilated to 3mm. ? Need for achalasia workup after EGD. CT chest 02/2013 showed no extrinsic compression. 03/2013 esophageal manometry Normal esophageal peristalsis with mildly elevated residual LES pressure consistent hypertensive LES (mild EGJ outflow obstruction).  2. Abd, epig pain 2014, to ER: [place] of abd was normal; cbc slightly elevated WBC, normal CMET.   History of Present Illness: Suzanne [name] is a pleasant 39 year old African-American female who was last seen here in December 2014. At that visit she had slightly elevated lower esophageal sphincter residual pressure that did not respond to dilation. Previously she had had a response to dilation. She was offered referral to a tertiary center at [facility] says or was offered observation. She opted to just see how she did over time and not go to [facility]. She is here today because she has had increasing difficulty swallowing food. She feels as if her food sticks in the lower esophagus and piles up. She gets a sense of nausea that is worse after ingestion of food. She feels full after 2 bites and then feels full for several hours. She has no heartburn but gets regurgitation of mouthfuls of fluid especially at night. She coughs a lot at night and her voice is been hoarse. She is on omeprazole but has been taking it at night and says she has difficulty swallowing all her pills.   Past Medical History  Diagnosis Date  . Asthma   . Hypertension   . Migraine   . Anemia   . Neck pain, chronic   . GERD (gastroesophageal reflux disease)   . Chronic  lower back pain     Past Surgical History  Procedure Laterality Date  . Vaginal hysterectomy    . Tubal ligation    . Esophageal dilation    . Esophagogastroduodenoscopy (egd) with esophageal dilation N/A 02/28/2013    Procedure: ESOPHAGOGASTRODUODENOSCOPY (EGD) WITH ESOPHAGEAL DILATION;  Surgeon: Milus Banister, MD;  Location: [facility];  Service: Endoscopy;  Laterality: N/A;  . Esophageal manometry N/A 04/01/2013    Procedure: ESOPHAGEAL MANOMETRY (EM);  Surgeon: Milus Banister, MD;  Location: [facility];  Service: Endoscopy;  Laterality: N/A;   Family History  Problem Relation Age of Onset  . Heart disease Father   . Hypertension Mother   . Hyperlipidemia Maternal Grandmother   . Hypertension Maternal Grandmother   . Hypertension Paternal Grandmother   . Diabetes Paternal Grandmother   . Cancer - Lung Paternal Grandmother    History  Substance Use Topics  . Smoking status: Current Some Day Smoker -- 0.10 packs/day for 10 years    Types: Cigars  . Smokeless tobacco: Never Used     Comment: smokes 2 Black & Milds per day 08/19/13  . Alcohol Use: No   Current Outpatient Prescriptions  Medication Sig Dispense Refill  . meloxicam (MOBIC) 7.5 MG tablet Take 7.5 mg by mouth daily.    Marland Kitchen omeprazole (PRILOSEC) 40 MG capsule Take 40 mg by mouth daily.    . propranolol (INDERAL) 80 MG tablet Take 80 mg by mouth 2 (two) times daily.    . sertraline (  ZOLOFT) 100 MG tablet Take 100 mg by mouth daily.    . traZODone (DESYREL) 150 MG tablet Take 150 mg by mouth at bedtime.    . valsartan-hydrochlorothiazide (DIOVAN-HCT) 320-25 MG per tablet TAKE 1 TABLET BY MOUTH DAILY. 30 tablet 1  . hyoscyamine (LEVSIN SL) 0.125 MG SL tablet Place 1 tablet (0.125 mg total) under the tongue every 8 (eight) hours as needed. For spasm 90 tablet 1  . lansoprazole (PREVACID) 30 MG capsule Take 30 minutes prior to breakfast 90 capsule 3   No current facility-administered medications for this visit.    Allergies  Allergen Reactions  . Reglan [Metoclopramide] Shortness Of Breath  . Lisinopril Cough  . Losartan Cough  . Sulfa Antibiotics Nausea And Vomiting  . Amoxicillin Rash  . Contrast Media [Iodinated Diagnostic Agents] Rash      Review of Systems: Gen: Denies any fever, chills, sweats, anorexia, fatigue, weakness, malaise, weight loss, and sleep disorder CV: Denies chest pain, angina, palpitations, syncope, orthopnea, PND, peripheral edema, and claudication. Resp: Denies dyspnea at rest, dyspnea with exercise, cough, sputum, wheezing, coughing up blood, and pleurisy. GI: Denies vomiting blood, jaundice, and fecal incontinence.   Has dysphagia to solids and liquids. GU : Denies urinary burning, blood in urine, urinary frequency, urinary hesitancy, nocturnal urination, and urinary incontinence. MS: Denies joint pain, limitation of movement, and swelling, stiffness, low back pain, extremity pain. Denies muscle weakness, cramps, atrophy.  Derm: Denies rash, itching, dry skin, hives, moles, warts, or unhealing ulcers.  Psych: Denies depression, anxiety, memory loss, suicidal ideation, hallucinations, paranoia, and confusion. Heme: Denies bruising, bleeding, and enlarged lymph nodes. Neuro:  Denies any headaches, dizziness, paresthesia Endo:  Denies any problems with DM, thyroid, adrenal    Studies:   EGD 12/22/2011 benign smooth narrowing at GE junction dilated to 20 mm. EGD 10-14 GE junction narrow and dilated with balloon.   Physical Exam: General: Pleasant, well developed female in no acute distress Head: Normocephalic and atraumatic Eyes:  sclerae anicteric, conjunctiva pink  Ears: Normal auditory acuity Lungs: Clear throughout to auscultation Heart: Regular rate and rhythm Abdomen: Soft, non distended, non-tender. No masses, no hepatomegaly. Normal bowel sounds Musculoskeletal: Symmetrical with no gross deformities  Extremities: No edema  Neurological: Alert  oriented x 4, grossly nonfocal Psychological:  Alert and cooperative. Normal mood and affect  Assessment and Recommendations: 39 year old female with a history of reflux and elevated LES pressure consistent with hypertensive LES, here with worsening dysphagia. Some of her exacerbation of symptoms may be due to recurrent stricturing from poorly controlled reflux. An antireflux regimen has been reviewed. She will discontinue omeprazole and be given a trial of Prevacid solute tabs 30 mg by mouth every morning. Due to her complaints of early satiety and prolonged (prandial fullness scheduled for a gastric emptying scan. If positive she may be considered for a trial of domperidone as she has been intolerant to metoclopramide in the past. She will also be scheduled for an EGD with possible dilation.The risks, benefits, and alternatives to endoscopy with possible biopsy and possible dilation were discussed with the patient and they consent to proceed.  She will be given an empiric trial of when necessary Levsin to see if this helps diminish some of her esophageal symptoms as well.? If she may be a candidate for Botox?  Follow-up recommendations will be made pending the findings of the above.        Rahma Meller, Deloris Ping 10/23/2014,

## 2014-10-23 NOTE — Patient Instructions (Addendum)
Stop Omeprazole  We have sent medications to your pharmacy for you to pick up at your convenience.  You have been scheduled for an endoscopy. Please follow written instructions given to you at your visit today. If you use inhalers (even only as needed), please bring them with you on the day of your procedure. Your physician has requested that you go to www.startemmi.com and enter the access code given to you at your visit today. This web site gives a general overview about your procedure. However, you should still follow specific instructions given to you by our office regarding your preparation for the procedure.  You have also been scheduled for a Gastric Emptying Scan at [facility] 11/05/2014 at 9am.  Please arrive at 845am.  Nothing to eat or drink after midnight.

## 2014-10-24 ENCOUNTER — Telehealth: Payer: Self-pay

## 2014-10-24 NOTE — Telephone Encounter (Signed)
Prior authorization was done via cover my meds for lansoprazole.

## 2014-10-24 NOTE — Progress Notes (Signed)
i agree with the above note, plan 

## 2014-11-05 ENCOUNTER — Encounter (HOSPITAL_COMMUNITY): Payer: Medicaid Other

## 2014-11-10 ENCOUNTER — Ambulatory Visit (HOSPITAL_COMMUNITY)
Admission: RE | Admit: 2014-11-10 | Discharge: 2014-11-10 | Disposition: A | Discharge status: Home / Self Care | Payer: Medicaid Other | Source: Ambulatory Visit | Attending: Physician Assistant | Admitting: Physician Assistant

## 2014-11-10 DIAGNOSIS — K219 Gastro-esophageal reflux disease without esophagitis: Secondary | ICD-10-CM | POA: Insufficient documentation

## 2014-11-10 DIAGNOSIS — R1310 Dysphagia, unspecified: Secondary | ICD-10-CM | POA: Insufficient documentation

## 2014-11-10 MED ORDER — TECHNETIUM TC 99M SULFUR COLLOID
2.0000 | Freq: Once | INTRAVENOUS | Status: AC | PRN
  Administered 2014-11-10: 2 via INTRAVENOUS

## 2014-12-03 ENCOUNTER — Ambulatory Visit (AMBULATORY_SURGERY_CENTER): Payer: Medicaid Other | Admitting: Gastroenterology

## 2014-12-03 ENCOUNTER — Encounter: Payer: Self-pay | Admitting: Gastroenterology

## 2014-12-03 VITALS — BP 129/87 | HR 66 | Temp 97.9°F | Resp 29 | Ht 64.0 in | Wt 207.0 lb

## 2014-12-03 DIAGNOSIS — R1310 Dysphagia, unspecified: Secondary | ICD-10-CM

## 2014-12-03 MED ORDER — SODIUM CHLORIDE 0.9 % IV SOLN: 500.0000 mL | INTRAVENOUS | Status: DC

## 2014-12-03 NOTE — Patient Instructions (Signed)
YOU HAD AN ENDOSCOPIC PROCEDURE TODAY AT [facility]:   Refer to the procedure report that was given to you for any specific questions about what was found during the examination.  If the procedure report does not answer your questions, please call your gastroenterologist to clarify.  If you requested that your care partner not be given the details of your procedure findings, then the procedure report has been included in a sealed envelope for you to review at your convenience later.  YOU SHOULD EXPECT: Some feelings of bloating in the abdomen. Passage of more gas than usual.  Walking can help get rid of the air that was put into your GI tract during the procedure and reduce the bloating. If you had a lower endoscopy (such as a colonoscopy or flexible sigmoidoscopy) you may notice spotting of blood in your stool or on the toilet paper. If you underwent a bowel prep for your procedure, you may not have a normal bowel movement for a few days.  Please Note:  You might notice some irritation and congestion in your nose or some drainage.  This is from the oxygen used during your procedure.  There is no need for concern and it should clear up in a day or so.  SYMPTOMS TO REPORT IMMEDIATELY:    Following upper endoscopy (EGD)  Vomiting of blood or coffee ground material  New chest pain or pain under the shoulder blades  Painful or persistently difficult swallowing  New shortness of breath  Fever of 100F or higher  Black, tarry-looking stools  For urgent or emergent issues, a gastroenterologist can be reached at any hour by calling [phone].   DIET: Your first meal following the procedure should be a small meal and then it is ok to progress to your normal diet. Heavy or fried foods are harder to digest and may make you feel nauseous or bloated.  Likewise, meals heavy in dairy and vegetables can increase bloating.  Drink plenty of fluids but you should avoid alcoholic beverages  for 24 hours.  ACTIVITY:  You should plan to take it easy for the rest of today and you should NOT DRIVE or use heavy machinery until tomorrow (because of the sedation medicines used during the test).    FOLLOW UP: Our staff will call the number listed on your records the next business day following your procedure to check on you and address any questions or concerns that you may have regarding the information given to you following your procedure. If we do not reach you, we will leave a message.  However, if you are feeling well and you are not experiencing any problems, there is no need to return our call.  We will assume that you have returned to your regular daily activities without incident.  If any biopsies were taken you will be contacted by phone or by letter within the next 1-3 weeks.  Please call us at [phone] if you have not heard about the biopsies in 3 weeks.    SIGNATURES/CONFIDENTIALITY: You and/or your care partner have signed paperwork which will be entered into your electronic medical record.  These signatures attest to the fact that that the information above on your After Visit Summary has been reviewed and is understood.  Full responsibility of the confidentiality of this discharge information lies with you and/or your care-partner.    Please change your prilosec so that you are taking one pill 20-30 minutes before breakfast and dinner.  Please start ranitidine 150 mg pill (OTC), one pill ar bedtime every night.  Chew your food well, take small bites and eat slowly.  You should get good fitting dentures to help chew. Handout was given to your care partner on GERD. You may resume your current medications today. Please call if any questions or concerns.

## 2014-12-03 NOTE — Progress Notes (Signed)
No problems noted in the recovery room. maw 

## 2014-12-03 NOTE — Progress Notes (Signed)
Patient awakening,vss,report to rn 

## 2014-12-03 NOTE — Op Note (Signed)
[facility]  [address] [place], [id number]   ENDOSCOPY PROCEDURE REPORT  PATIENT: Suzanne [name], Suzanne [name]  MR#: [id number] BIRTHDATE: 1976/03/14 , 39  yrs. old GENDER: female ENDOSCOPIST: Milus Banister, MD PROCEDURE DATE:  12/03/2014 PROCEDURE:  EGD, diagnostic ASA CLASS:     Class II INDICATIONS:  dyspepsia, dysphagia; dysphagia; had EGD with 32mm balloon dilation 11/2011 for dysphagia, mild GE junction narrowing with improvement of her symptoms after dilation.  02/2013 abd pain to er, also dysphagia.  Repeat EGD 02/2013 Ardis Hughs again found slightly narrow, smooth GE junction, dilated to 69mm.  ? Need for achalasia workup after EGD.  CT chest 02/2013 showed no extrinsic compression.  03/2013 esophageal manometry Normal esophageal peristalsis with mildly elevated residual LES pressure consistent hypertensive LES (mild EGJ outflow obstruction). MEDICATIONS: Monitored anesthesia care and Propofol 200 mg IV TOPICAL ANESTHETIC: none  DESCRIPTION OF PROCEDURE: After the risks benefits and alternatives of the procedure were thoroughly explained, informed consent was obtained.  The LB [id number] endoscope was introduced through the mouth and advanced to the second portion of the duodenum , Without limitations.  The instrument was slowly withdrawn as the mucosa was fully examined.  EXAM: The esophagus and gastroesophageal junction were completely normal in appearance.  The stomach was entered and closely examined.The antrum, angularis, and lesser curvature were well visualized, including a retroflexed view of the cardia and fundus. The stomach wall was normally distensable.  The scope passed easily through the pylorus into the duodenum.  Retroflexed views revealed no abnormalities.     The scope was then withdrawn from the patient and the procedure completed.  COMPLICATIONS: There were no immediate complications.  ENDOSCOPIC IMPRESSION: Normal appearing  esophagus and GE junction, the stomach was well visualized and normal in appearance, normal appearing duodenum  RECOMMENDATIONS: Please change your prilosec so that you are taking one pill 20-30 min before breakfast and dinner.  Please start ranitidine 150mg  pill (OTC), one pill at bedtime every night.  Chew your food well, take small bites and eat slowly.  You should get good fitting dentures to help chewing.  eSigned:  Milus Banister, MD 12/03/2014 1:51 PM

## 2014-12-04 ENCOUNTER — Telehealth: Payer: Self-pay | Admitting: *Deleted

## 2014-12-04 NOTE — Telephone Encounter (Signed)
  Follow up Call-  Call back number 12/03/2014  Post procedure Call Back phone  # [phone]  Permission to leave phone message Yes     Patient questions:  Do you have a fever, pain , or abdominal swelling? No. Pain Score  0 *  Have you tolerated food without any problems? Yes.    Have you been able to return to your normal activities? Yes.    Do you have any questions about your discharge instructions: Diet   No. Medications  No. Follow up visit  No.  Do you have questions or concerns about your Care? No.  Actions: * If pain score is 4 or above: No action needed, pain <4.

## 2015-07-18 ENCOUNTER — Emergency Department (HOSPITAL_COMMUNITY): Payer: Medicaid Other

## 2015-07-18 ENCOUNTER — Emergency Department (HOSPITAL_COMMUNITY)
Admission: EM | Admit: 2015-07-18 | Discharge: 2015-07-18 | Disposition: A | Discharge status: Home / Self Care | Payer: Medicaid Other | Attending: Emergency Medicine | Admitting: Emergency Medicine

## 2015-07-18 ENCOUNTER — Encounter (HOSPITAL_COMMUNITY): Payer: Self-pay | Admitting: Emergency Medicine

## 2015-07-18 DIAGNOSIS — Y9289 Other specified places as the place of occurrence of the external cause: Secondary | ICD-10-CM | POA: Insufficient documentation

## 2015-07-18 DIAGNOSIS — Z880 Allergy status to penicillin: Secondary | ICD-10-CM | POA: Insufficient documentation

## 2015-07-18 DIAGNOSIS — X501XXA Overexertion from prolonged static or awkward postures, initial encounter: Secondary | ICD-10-CM | POA: Diagnosis not present

## 2015-07-18 DIAGNOSIS — Y998 Other external cause status: Secondary | ICD-10-CM | POA: Diagnosis not present

## 2015-07-18 DIAGNOSIS — N760 Acute vaginitis: Secondary | ICD-10-CM | POA: Diagnosis not present

## 2015-07-18 DIAGNOSIS — K219 Gastro-esophageal reflux disease without esophagitis: Secondary | ICD-10-CM | POA: Diagnosis not present

## 2015-07-18 DIAGNOSIS — S96912A Strain of unspecified muscle and tendon at ankle and foot level, left foot, initial encounter: Secondary | ICD-10-CM | POA: Diagnosis not present

## 2015-07-18 DIAGNOSIS — I10 Essential (primary) hypertension: Secondary | ICD-10-CM

## 2015-07-18 DIAGNOSIS — B001 Herpesviral vesicular dermatitis: Secondary | ICD-10-CM | POA: Diagnosis not present

## 2015-07-18 DIAGNOSIS — J45909 Unspecified asthma, uncomplicated: Secondary | ICD-10-CM | POA: Insufficient documentation

## 2015-07-18 DIAGNOSIS — Z791 Long term (current) use of non-steroidal anti-inflammatories (NSAID): Secondary | ICD-10-CM | POA: Diagnosis not present

## 2015-07-18 DIAGNOSIS — B9689 Other specified bacterial agents as the cause of diseases classified elsewhere: Secondary | ICD-10-CM

## 2015-07-18 DIAGNOSIS — G8929 Other chronic pain: Secondary | ICD-10-CM | POA: Insufficient documentation

## 2015-07-18 DIAGNOSIS — Z862 Personal history of diseases of the blood and blood-forming organs and certain disorders involving the immune mechanism: Secondary | ICD-10-CM | POA: Diagnosis not present

## 2015-07-18 DIAGNOSIS — F17210 Nicotine dependence, cigarettes, uncomplicated: Secondary | ICD-10-CM | POA: Diagnosis not present

## 2015-07-18 DIAGNOSIS — Y9389 Activity, other specified: Secondary | ICD-10-CM | POA: Diagnosis not present

## 2015-07-18 DIAGNOSIS — S99912A Unspecified injury of left ankle, initial encounter: Secondary | ICD-10-CM | POA: Diagnosis present

## 2015-07-18 LAB — URINALYSIS, ROUTINE W REFLEX MICROSCOPIC
Bilirubin Urine: NEGATIVE
Glucose, UA: NEGATIVE mg/dL
Hgb urine dipstick: NEGATIVE
Ketones, ur: NEGATIVE mg/dL
Leukocytes, UA: NEGATIVE
Nitrite: NEGATIVE
Protein, ur: NEGATIVE mg/dL
Specific Gravity, Urine: 1.009 (ref 1.005–1.030)
pH: 6.5 (ref 5.0–8.0)

## 2015-07-18 LAB — WET PREP, GENITAL
Sperm: NONE SEEN
Trich, Wet Prep: NONE SEEN
Yeast Wet Prep HPF POC: NONE SEEN

## 2015-07-18 LAB — I-STAT CHEM 8, ED
BUN: 7 mg/dL (ref 6–20)
Calcium, Ion: 1.21 mmol/L (ref 1.12–1.23)
Chloride: 102 mmol/L (ref 101–111)
Creatinine, Ser: 0.7 mg/dL (ref 0.44–1.00)
Glucose, Bld: 86 mg/dL (ref 65–99)
HCT: 40 % (ref 36.0–46.0)
Hemoglobin: 13.6 g/dL (ref 12.0–15.0)
Potassium: 3.8 mmol/L (ref 3.5–5.1)
Sodium: 142 mmol/L (ref 135–145)
TCO2: 29 mmol/L (ref 0–100)

## 2015-07-18 MED ORDER — ACETAMINOPHEN 500 MG PO TABS
1000.0000 mg | ORAL_TABLET | Freq: Once | ORAL | Status: AC
  Administered 2015-07-18: 1000 mg via ORAL
  Filled 2015-07-18: qty 2

## 2015-07-18 MED ORDER — VALACYCLOVIR HCL 500 MG PO TABS
2000.0000 mg | ORAL_TABLET | Freq: Once | ORAL | Status: AC
Start: 2015-07-18 — End: 2015-07-18
  Administered 2015-07-18: 2000 mg via ORAL
  Filled 2015-07-18: qty 4

## 2015-07-18 MED ORDER — NAPROXEN 500 MG PO TABS: 500.0000 mg | ORAL_TABLET | Freq: Two times a day (BID) | ORAL | Status: DC

## 2015-07-18 MED ORDER — VALACYCLOVIR HCL 1 G PO TABS: 2000.0000 mg | ORAL_TABLET | Freq: Once | ORAL | Status: DC

## 2015-07-18 MED ORDER — HYDROCODONE-ACETAMINOPHEN 5-325 MG PO TABS
1.0000 | ORAL_TABLET | Freq: Once | ORAL | Status: AC
  Administered 2015-07-18: 1 via ORAL
  Filled 2015-07-18: qty 1

## 2015-07-18 MED ORDER — AMLODIPINE BESYLATE 10 MG PO TABS: 10.0000 mg | ORAL_TABLET | Freq: Every day | ORAL | Status: DC

## 2015-07-18 MED ORDER — NAPROXEN 500 MG PO TABS
500.0000 mg | ORAL_TABLET | Freq: Once | ORAL | Status: AC
  Administered 2015-07-18: 500 mg via ORAL
  Filled 2015-07-18: qty 1

## 2015-07-18 MED ORDER — HYDRALAZINE HCL 20 MG/ML IJ SOLN: 10.0000 mg | Freq: Once | INTRAMUSCULAR | Status: DC

## 2015-07-18 MED ORDER — AMLODIPINE BESYLATE 5 MG PO TABS
5.0000 mg | ORAL_TABLET | Freq: Once | ORAL | Status: AC
  Administered 2015-07-18: 5 mg via ORAL
  Filled 2015-07-18: qty 1

## 2015-07-18 MED ORDER — HYDRALAZINE HCL 20 MG/ML IJ SOLN
10.0000 mg | Freq: Once | INTRAMUSCULAR | Status: AC
  Administered 2015-07-18: 10 mg via INTRAVENOUS
  Filled 2015-07-18: qty 1

## 2015-07-18 MED ORDER — METRONIDAZOLE 500 MG PO TABS: 500.0000 mg | ORAL_TABLET | Freq: Two times a day (BID) | ORAL | Status: DC

## 2015-07-18 NOTE — Discharge Instructions (Signed)
Regarding your ankle pain, your x-ray was normal. You likely sprained/strained your ankle. We gave you a brace to use to apply compression. Elevate your leg when you can. You may take naproxen 500mg  every 6 hours as needed for pain.  Regarding your vaginal symptoms, your exam today did show evidence of bacterial vaginosis (BV). I will give you a prescription for one week of Flagyl. We will call you if any of your tests come back positive.  Regarding your blood pressure, your BP was high in the ER today. You mentioned your blood pressure has been high for a while. I will give you a prescription for another BP medication (amlodipine) to take along with the Diovan you have at home. Please call your primary care provider to schedule a follow up appointment in clinic next week. Your blood pressure will need to be re-checked at that time and perhaps medication adjusted. We checked your kidney function today and it was normal.

## 2015-07-18 NOTE — ED Notes (Signed)
PA aware of pt's elevated BP; OK to discharge due to asymptomatic presentation of hypertension.

## 2015-07-18 NOTE — ED Notes (Signed)
Pt reports woke this morning with lips sores, facial swelling and vaginal discharges. Also reports fell today and twisted her left leg. Lower back pain , dysuria , no hematuria.

## 2015-07-18 NOTE — ED Notes (Signed)
Called ortho tech to place ankle ASO

## 2015-07-18 NOTE — ED Provider Notes (Signed)
CSN: [id number]     Arrival date & time 07/18/15  1200 History   First MD Initiated Contact with Patient 07/18/15 1224     Chief Complaint  Patient presents with  . multiple complaints     HPI   Suzanne [name] is an 40 y.o. female with history of HTN, GERD, anemia who presents to the ED for evaluation of oral lesions and vaginal discharge. Regarding her oral lesions, she states this morning she woke up and noticed two clusters of small, fluid-filled lesions on the vermillion border of her lips. She states the skin around that area feels tight and burning. Denies tongue swelling, difficulty talking, difficulty swallowing. She states she has been diagnosed with herpes before but has never had cold sores. She states she started using Abreeva today.  Pt is also here for evaluation of urinary urgency and vaginal discharge. She states that for a while now she has noticed intermittent malodorous vaginal discharge. States that for the past two days she has also noticed she has been urinating a lot more than usual. She denies dysuria. Denies gross hematuria. Denies fever, chills, abd pain, n/v/d. She states she is sexually active with one partner. She reports she has had BV before and feels this might be BV.   Pt also complaining of left ankle pain. She states she twisted her ankle in the shower a couple days ago. Denies falling. States she is still able to bear weight but it is painful. Denies numbness, tingling, weakness.  Past Medical History  Diagnosis Date  . Asthma   . Hypertension   . Migraine   . Anemia   . Neck pain, chronic   . GERD (gastroesophageal reflux disease)   . Chronic lower back pain    Past Surgical History  Procedure Laterality Date  . Vaginal hysterectomy    . Tubal ligation    . Esophageal dilation    . Esophagogastroduodenoscopy (egd) with esophageal dilation N/A 02/28/2013    Procedure: ESOPHAGOGASTRODUODENOSCOPY (EGD) WITH ESOPHAGEAL DILATION;  Surgeon: Milus Banister, MD;   Location: [facility];  Service: Endoscopy;  Laterality: N/A;  . Esophageal manometry N/A 04/01/2013    Procedure: ESOPHAGEAL MANOMETRY (EM);  Surgeon: Milus Banister, MD;  Location: [facility];  Service: Endoscopy;  Laterality: N/A;   Family History  Problem Relation Age of Onset  . Heart disease Father   . Hypertension Mother   . Hyperlipidemia Maternal Grandmother   . Hypertension Maternal Grandmother   . Hypertension Paternal Grandmother   . Diabetes Paternal Grandmother   . Cancer - Lung Paternal Grandmother    Social History  Substance Use Topics  . Smoking status: Current Some Day Smoker -- 0.10 packs/day for 10 years    Types: Cigars  . Smokeless tobacco: Never Used     Comment: smokes 2 Black & Milds per day 08/19/13  . Alcohol Use: No   OB History    Gravida Para Term Preterm AB TAB SAB Ectopic Multiple Living   2 2  2            Review of Systems  All other systems reviewed and are negative.     Allergies  Reglan; Lisinopril; Losartan; Sulfa antibiotics; Amoxicillin; and Contrast media  Home Medications   Prior to Admission medications   Medication Sig Start Date End Date Taking? Authorizing Provider  meloxicam (MOBIC) 7.5 MG tablet Take 7.5 mg by mouth daily.   Yes Historical Provider, MD  omeprazole (PRILOSEC) 40 MG capsule Take  40 mg by mouth daily.   Yes Historical Provider, MD  sertraline (ZOLOFT) 100 MG tablet Take 100 mg by mouth daily.   Yes Historical Provider, MD  valsartan-hydrochlorothiazide (DIOVAN-HCT) 320-25 MG per tablet TAKE 1 TABLET BY MOUTH DAILY. 10/17/14  Yes Troy Sine, MD  hyoscyamine (LEVSIN SL) 0.125 MG SL tablet Place 1 tablet (0.125 mg total) under the tongue every 8 (eight) hours as needed. For spasm Patient not taking: Reported on 07/18/2015 10/23/14   Cecille Rubin P Hvozdovic, PA-C  lansoprazole (PREVACID) 30 MG capsule Take 30 minutes prior to breakfast Patient not taking: Reported on 12/03/2014 10/23/14   Cecille Rubin P Hvozdovic, PA-C   BP  173/113 mmHg  Pulse 81  Temp(Src) 98.1 F (36.7 C)  Resp 18  SpO2 98% Physical Exam  Constitutional: She is oriented to person, place, and time. No distress.  HENT:  Head: Atraumatic.    Right Ear: External ear normal.  Left Ear: External ear normal.  Nose: Nose normal.  Mouth/Throat: No trismus in the jaw.  Two clusters of vesicular lesions on vermilion border. No surrounding erythema. No drainage. Non ulcerated. No intra-oral lesions  Eyes: Conjunctivae are normal. No scleral icterus.  Neck: Normal range of motion. Neck supple.  Cardiovascular: Normal rate and regular rhythm.   Pulmonary/Chest: Effort normal. No respiratory distress. She exhibits no tenderness.  Abdominal: Soft. Bowel sounds are normal. She exhibits no distension. There is no tenderness. There is no CVA tenderness.  Genitourinary:  No external lesions. There is copious smooth white discharge filling vaginal canal. No tenderness. No adnexal tenderness. No bleeding.   Musculoskeletal:  Left ankle mildly diffusely edematous. No erythema. Mild diffuse tenderness. FROM. Able to ambulate unassisted. 2+ distal pulses.  Neurological: She is alert and oriented to person, place, and time.  Skin: Skin is warm and dry. She is not diaphoretic.  Psychiatric: She has a normal mood and affect. Her behavior is normal.  Nursing note and vitals reviewed.  Filed Vitals:   07/18/15 1213 07/18/15 1215 07/18/15 1456 07/18/15 1602  BP: 182/123 173/113 189/107 182/102  Pulse: 81  72 77  Temp: 98.1 F (36.7 C)   98.3 F (36.8 C)  TempSrc:   Oral Oral  Resp: 18  16 18   SpO2: 98%  100% 100%     ED Course  Procedures (including critical care time) Labs Review Labs Reviewed  WET PREP, GENITAL - Abnormal; Notable for the following:    Clue Cells Wet Prep HPF POC PRESENT (*)    WBC, Wet Prep HPF POC FEW (*)    All other components within normal limits  URINALYSIS, ROUTINE W REFLEX MICROSCOPIC (NOT AT [facility])  HIV ANTIBODY  (ROUTINE TESTING)  RPR  I-STAT CHEM 8, ED  GC/CHLAMYDIA PROBE AMP ([place]) NOT AT [facility]    Imaging Review Dg Ankle Complete Left  07/18/2015  CLINICAL DATA:  Fall yesterday in bathtub. Pain in dorsal surface of left foot/ankle when pressure is applied. EXAM: LEFT ANKLE COMPLETE - 3+ VIEW COMPARISON:  None. FINDINGS: There is no evidence of fracture, dislocation, or joint effusion. There is no evidence of arthropathy or other focal bone abnormality. Soft tissues are unremarkable. IMPRESSION: Negative. Electronically Signed   By: Lajean Manes M.D.   On: 07/18/2015 13:54   I have personally reviewed and evaluated these images and lab results as part of my medical decision-making.   EKG Interpretation None      MDM   Final diagnoses:  Herpes labialis  BV (  bacterial vaginosis)  Left ankle strain, initial encounter  Essential hypertension    WEt prep shows evidence of BV. Will give rx for flagyl. First dose valtrex given in the ED for herpes labialis. Will give rx for second dose tonight. UA unremarkable. Pt hypertensive in the ED. She states she has been taking Diovan as prescribed but her BP remains high. States when she has checked it recently it has been high, though she does not remember exact numbers. She states she thinks it is high today because of her ankle pain. Will give pain meds and re-check BP. She denies headache, visual disturbance, urinary changes. Pt is driving home so will hold off on vicodin for now.  BP remains elevated. Will give norvasc, tylenol.  Ankle x-ray is negative. Likely sprain/strain. Encouraged NSAIDs and RICE therapy. ASO brace given in the ED.  Pt now complaining of headache and severe back pain to RN. She did not complain of headache or back pain on my multiple reassessments. Reports diffuse headache, denies visual disturbance. Denies weakness, numbness, dizziness. She is tearful in the room now due to pain. She states her husband will come pick her up  if we give her strong pain medicine. Will give norco and hydralazine given BP remains elevated. Will check i stat chem 8 to check kidney function.  HA resolved. No e/o end organ damage. Kidney function normal. BP remains high. However, would consider this asymptomatic hypertensive urgency. Will d/c home with rx for amlodipine to take in addition to Diovan. Valtrex and flagyl given as above. Instructed to f/u with PCP next week. ER return precautions given.   Anne Ng, PA-C 07/18/15 1712  Daleen Bo, MD 07/18/15 [phone]

## 2015-07-19 LAB — HIV ANTIBODY (ROUTINE TESTING W REFLEX): HIV Screen 4th Generation wRfx: NONREACTIVE

## 2015-07-19 LAB — RPR: RPR Ser Ql: NONREACTIVE

## 2015-07-20 LAB — GC/CHLAMYDIA PROBE AMP (~~LOC~~) NOT AT ARMC
Chlamydia: NEGATIVE
Neisseria Gonorrhea: NEGATIVE

## 2015-12-17 ENCOUNTER — Ambulatory Visit: Payer: Medicaid Other | Admitting: Neurology

## 2015-12-18 ENCOUNTER — Telehealth: Payer: Self-pay | Admitting: *Deleted

## 2015-12-18 NOTE — Telephone Encounter (Signed)
Pt medical records faxed to [facility] on 12/18/15.

## 2015-12-24 ENCOUNTER — Encounter: Payer: Self-pay | Admitting: Neurology

## 2015-12-24 ENCOUNTER — Other Ambulatory Visit (INDEPENDENT_AMBULATORY_CARE_PROVIDER_SITE_OTHER): Payer: Medicaid Other

## 2015-12-24 ENCOUNTER — Ambulatory Visit (INDEPENDENT_AMBULATORY_CARE_PROVIDER_SITE_OTHER): Payer: Medicaid Other | Admitting: Neurology

## 2015-12-24 VITALS — BP 130/88 | HR 84 | Ht 64.0 in | Wt 157.0 lb

## 2015-12-24 DIAGNOSIS — R29818 Other symptoms and signs involving the nervous system: Secondary | ICD-10-CM

## 2015-12-24 DIAGNOSIS — R296 Repeated falls: Secondary | ICD-10-CM

## 2015-12-24 DIAGNOSIS — R531 Weakness: Secondary | ICD-10-CM

## 2015-12-24 DIAGNOSIS — M542 Cervicalgia: Secondary | ICD-10-CM

## 2015-12-24 DIAGNOSIS — F17200 Nicotine dependence, unspecified, uncomplicated: Secondary | ICD-10-CM

## 2015-12-24 LAB — VITAMIN D 25 HYDROXY (VIT D DEFICIENCY, FRACTURES): VITD: 13.56 ng/mL — ABNORMAL LOW (ref 30.00–100.00)

## 2015-12-24 LAB — C-REACTIVE PROTEIN: CRP: 0.3 mg/dL — ABNORMAL LOW (ref 0.5–20.0)

## 2015-12-24 LAB — VITAMIN B12: Vitamin B-12: 205 pg/mL — ABNORMAL LOW (ref 211–911)

## 2015-12-24 LAB — SEDIMENTATION RATE: Sed Rate: 25 mm/hr — ABNORMAL HIGH (ref 0–20)

## 2015-12-24 LAB — FOLATE: Folate: 9.3 ng/mL (ref >5.9)

## 2015-12-24 NOTE — Patient Instructions (Signed)
1.  MRI cervical spine wwo contrast 2.  Please pick up CD of your MRI lumbar spine and the report 3.  Check blood work  We will call you with the results  Return to clinic in 4 months

## 2015-12-24 NOTE — Progress Notes (Signed)
[facility] Clinic Note - Initial Visit   Date: 12/24/15  Suzanne [name] MRN: [id number] DOB: 1976/04/20   Dear Dr. Pearline Cables:  Thank you for your kind referral of Damita [name] for consultation of falls. Although her history is well known to you, please allow [place] to reiterate it for the purpose of our medical record. The patient was accompanied to the clinic by self.    History of Present Illness: Suzanne [name] is a 40 y.o. right-handed African American female with hypertension, GERD, tobacco user, and chronic low back pain presenting for evaluation of falls and back pain.    She moved from [place] in 2011 and about a year later, she recalls having her first fall.  Since this time, she continues to have unprocoked falls about twice per year.  Initially, she attributed it to slipping on the floor or missteping, but since symptoms have increased, she was referred for further evaluation.  Her falls have occurred as follows: (1) first fall occurred while getting into the tub and her legs buckled, she had to sit for a while and then was able to stand, (2) she was pushing a cart at work and again suddenly her legs gave out, no associated pain (3) fell in the kitchen (4) another time she fell while getting out of the shower during which time she did have back pain, (5) in March 2017, she was standing on the porch talking to her mother and suddenly collapsed because her legs again buckled.   There was no associated back or leg pain.  She has occasion tingling of the toes on the left.  She is able to move her legs immediately following a falls and usually takes about 10-minutes for her to stand up again, which is due to pain.   She does not have bladder/bowel incontinence.   She has chronic neck and back pain and has seen Pain Management for [facility] and tried physical therapy.  She has had MRI of the lumbar region performed elsewhere and I do not have this to review.   She  currently works as a Pensions consultant and has cut her working hours because of back and neck pain with prolonged standing.  Out-side paper records, electronic medical record, and images have been reviewed where available and summarized as:  Lab Results  Component Value Date   TSH 0.799 07/19/2013   CT cervical spine wo contrast 9/6/014:  Minimal degenerative disc disease changes at C4-C5. Spina bifida occulta of the posterior elements of T1. No acute bony abnormalities.  Past Medical History:  Diagnosis Date  . Anemia   . Asthma   . Chronic lower back pain   . GERD (gastroesophageal reflux disease)   . Hypertension   . Migraine   . Neck pain, chronic     Past Surgical History:  Procedure Laterality Date  . ESOPHAGEAL DILATION    . ESOPHAGEAL MANOMETRY N/A 04/01/2013   Procedure: ESOPHAGEAL MANOMETRY (EM);  Surgeon: Milus Banister, MD;  Location: [facility];  Service: Endoscopy;  Laterality: N/A;  . ESOPHAGOGASTRODUODENOSCOPY (EGD) WITH ESOPHAGEAL DILATION N/A 02/28/2013   Procedure: ESOPHAGOGASTRODUODENOSCOPY (EGD) WITH ESOPHAGEAL DILATION;  Surgeon: Milus Banister, MD;  Location: [facility];  Service: Endoscopy;  Laterality: N/A;  . TUBAL LIGATION    . VAGINAL HYSTERECTOMY       Medications:  Outpatient Encounter Prescriptions as of 12/24/2015  Medication Sig  . amLODipine (NORVASC) 10 MG tablet Take 1 tablet (10 mg  total) by mouth daily.  . lansoprazole (PREVACID) 30 MG capsule Take 30 minutes prior to breakfast  . metroNIDAZOLE (FLAGYL) 500 MG tablet Take 1 tablet (500 mg total) by mouth 2 (two) times daily.  . valsartan-hydrochlorothiazide (DIOVAN-HCT) 320-25 MG per tablet TAKE 1 TABLET BY MOUTH DAILY.  . valACYclovir (VALTREX) 1000 MG tablet Take 2 tablets (2,000 mg total) by mouth once. (Patient not taking: Reported on 12/24/2015)  . [DISCONTINUED] hyoscyamine (LEVSIN SL) 0.125 MG SL tablet Place 1 tablet (0.125 mg total) under the tongue every 8 (eight) hours as  needed. For spasm (Patient not taking: Reported on 07/18/2015)  . [DISCONTINUED] meloxicam (MOBIC) 7.5 MG tablet Take 7.5 mg by mouth daily.  . [DISCONTINUED] naproxen (NAPROSYN) 500 MG tablet Take 1 tablet (500 mg total) by mouth 2 (two) times daily.  . [DISCONTINUED] omeprazole (PRILOSEC) 40 MG capsule Take 40 mg by mouth daily.  . [DISCONTINUED] sertraline (ZOLOFT) 100 MG tablet Take 100 mg by mouth daily.   No facility-administered encounter medications on file as of 12/24/2015.      Allergies:  Allergies  Allergen Reactions  . Reglan [Metoclopramide] Shortness Of Breath  . Lisinopril Cough  . Losartan Cough  . Sulfa Antibiotics Nausea And Vomiting  . Amoxicillin Rash    Has patient had a PCN reaction causing immediate rash, facial/tongue/throat swelling, SOB or l If all of the above answers are "NO", then may proceed with Cephalosporin use.   . Contrast Media [Iodinated Diagnostic Agents] Rash    Family History: Family History  Problem Relation Age of Onset  . Heart disease Father   . Hypertension Mother   . Hyperlipidemia Maternal Grandmother   . Hypertension Maternal Grandmother   . Hypertension Paternal Grandmother   . Diabetes Paternal Grandmother   . Cancer - Lung Paternal Grandmother     Social History: Social History  Substance Use Topics  . Smoking status: Current Some Day Smoker    Packs/day: 0.10    Years: 10.00    Types: Cigars  . Smokeless tobacco: Never Used     Comment: smokes 2 Black & Milds per day 08/19/13  . Alcohol use Yes     Comment: 1-2 times yearly   Social History   Social History Narrative   Patient lives at home with family.   Patient goes to [employer].   Caffeine Use 5-6 cups daily   Patient has 2 adopted children.    Review of Systems:  CONSTITUTIONAL: No fevers, chills, night sweats, or weight loss.   EYES: No visual changes or eye pain ENT: No hearing changes.  No history of nose bleeds.   RESPIRATORY: No cough, wheezing  and shortness of breath.   CARDIOVASCULAR: Negative for chest pain, and palpitations.   GI: Negative for abdominal discomfort, blood in stools or black stools.  No recent change in bowel habits.   GU:  No history of incontinence.   MUSCLOSKELETAL: +history of joint pain or swelling.  No myalgias.   SKIN: Negative for lesions, rash, and itching.   HEMATOLOGY/ONCOLOGY: Negative for prolonged bleeding, bruising easily, and swollen nodes.  No history of cancer.   ENDOCRINE: Negative for cold or heat intolerance, polydipsia or goiter.   PSYCH:  +depression or anxiety symptoms.   NEURO: As Above.   Vital Signs:  BP 130/88 (BP Location: Left Arm, Patient Position: Sitting, Cuff Size: Normal)   Pulse 84   Ht '5\' 4"'$  (1.626 m)   Wt 157 lb (71.2 kg)   BMI  26.95 kg/m    General Medical Exam:   General:  Well appearing, comfortable.   Eyes/ENT: see cranial nerve examination.   Neck: No masses appreciated.  Full range of motion without tenderness.  No carotid bruits.  Positive Lhermitte's sign Respiratory:  Clear to auscultation, good air entry bilaterally.   Cardiac:  Regular rate and rhythm, no murmur.   Extremities:  No deformities, edema, or skin discoloration.  Skin:  No rashes or lesions.  Neurological Exam: MENTAL STATUS including orientation to time, place, person, recent and remote memory, attention span and concentration, language, and fund of knowledge is normal.  Speech is not dysarthric.  CRANIAL NERVES: II:  No visual field defects.  Unremarkable fundi.   III-IV-VI: Pupils equal round and reactive to light.  Normal conjugate, extra-ocular eye movements in all directions of gaze.  No nystagmus.  No ptosis.   V:  Normal facial sensation.     VII:  Normal facial symmetry and movements.  No pathologic facial reflexes.  VIII:  Normal hearing and vestibular function.   IX-X:  Normal palatal movement.   XI:  Normal shoulder shrug and head rotation.   XII:  Normal tongue strength and  range of motion, no deviation or fasciculation.  MOTOR: Motor strength is 5/5 throughout.  No atrophy, fasciculations or abnormal movements.  No pronator drift.  Tone is normal.    MSRs:  Right                                                                 Left brachioradialis 2+  brachioradialis 2+  biceps 2+  biceps 2+  triceps 2+  triceps 2+  patellar 2+  patellar 2+  ankle jerk 2+  ankle jerk 2+  Hoffman no  Hoffman no  plantar response down  plantar response down   SENSORY:  Normal and symmetric perception of light touch, pinprick, vibration, and proprioception.  Romberg's sign absent.   COORDINATION/GAIT: Normal finger-to- nose-finger.  Intact rapid alternating movements bilaterally.  Able to rise from a chair without using arms.  Gait narrow based and stable. Tandem and stressed gait intact.    IMPRESSION: Ms. Terwilliger is a 40 year-old female referred for evaluation of frequent falls.  Her neurological exam does not show any signs of myelopathy or weakness, but there is positive Lhermitte's sign.  Imaging of the cervical spine will be ordered to look for structural spinal cord abnormalities which could cause her neck discomfort and episodic weakness.  With her normal exam and transient episodic symptoms, vascular abnormalities such as dural AV fistula is also considered.  If imaging of her cervical spine is normal, will need to look at her thoracic spine and/or brain (demyelating changes) next.    PLAN/RECOMMENDATIONS:  1.  MRI cervical spine wwo contrast 2.  MRI lumbar spine images and report requested 3.  Check ESR, vitamin B12, CRP, vitamin D, folate, ANA 4.  Smoking cessation instruction/counseling given:  counseled patient on the dangers of tobacco use, advised patient to stop smoking, and reviewed strategies to maximize success  Return to clinic in 3 months.   The duration of this appointment visit was 45 minutes of face-to-face time with the patient.  Greater than 50% of  this time was spent in counseling, explanation  of diagnosis, planning of further management, and coordination of care.   Thank you for allowing me to participate in patient's care.  If I can answer any additional questions, I would be pleased to do so.    Sincerely,    Shanquita Ronning K. Posey Pronto, DO

## 2015-12-25 LAB — ANA: Anti Nuclear Antibody(ANA): NEGATIVE

## 2016-01-01 ENCOUNTER — Encounter: Payer: Self-pay | Admitting: *Deleted

## 2016-01-01 ENCOUNTER — Telehealth: Payer: Self-pay | Admitting: Neurology

## 2016-01-01 NOTE — Telephone Encounter (Signed)
Multiple attempts to contact patient have been made.  There is no answer.    I have reviewed the reports of her previous imaging (see below) which does not show anything worrisome. Because her labs show low vitamin B12 and D, this may be causing her weakness. I would prefer to treat her vitamin deficiency and see how she does instead or repeating her MRI c-spine.  If no improvement in 3 months, okay to order imaging then. Let's cancel MRI c-spine for now.  MRI brain wo contrast 06/14/2011:  Normal  MRI cervical spine wo contrast 06/14/2011:  Mild posterior central disc protrusions at C4-5 and C5-6.  No spinal or foraminal stenosis. MRI lumbar spine 06/14/2011:  L4-5 disc bulge and facet hypertrophy with moderate biforaminal stenosis; L5-S1 disc bulge with no spinal or foraminal stenosis.  Gage Treiber K. Posey Pronto, DO

## 2016-01-05 ENCOUNTER — Ambulatory Visit: Payer: Medicaid Other | Admitting: Neurology

## 2016-01-06 ENCOUNTER — Other Ambulatory Visit: Payer: Self-pay | Admitting: *Deleted

## 2016-01-06 ENCOUNTER — Telehealth: Payer: Self-pay | Admitting: *Deleted

## 2016-01-06 DIAGNOSIS — E559 Vitamin D deficiency, unspecified: Secondary | ICD-10-CM

## 2016-01-06 MED ORDER — VITAMIN D (ERGOCALCIFEROL) 1.25 MG (50000 UNIT) PO CAPS: 50000.0000 [IU] | ORAL_CAPSULE | ORAL | 0 refills | Status: DC

## 2016-01-06 NOTE — Telephone Encounter (Signed)
Patient received her letter to call office.  I gave her information about treating vitamin deficiency and holding off on MRI.  She said that she is currently taking vitamin D2 4000 units daily.  Would you like to increase and how would you like to treat B12 deficiency?

## 2016-01-06 NOTE — Telephone Encounter (Signed)
Start vitamin D 50,000 units weekly x 8 weeks and then vitamin D 2000 units daily thereafter PO - please send Rx for this. Also start Vitamin B12 109mcg IM injection daily x 7 days, weekly x 4 weeks, then monthly thereafter x 1 year.

## 2016-01-06 NOTE — Telephone Encounter (Signed)
Patient notified.  Rx sent and she will begin injections tomorrow.

## 2016-01-08 ENCOUNTER — Ambulatory Visit (INDEPENDENT_AMBULATORY_CARE_PROVIDER_SITE_OTHER): Payer: Medicaid Other | Admitting: *Deleted

## 2016-01-08 ENCOUNTER — Inpatient Hospital Stay: Admission: RE | Admit: 2016-01-08 | Payer: Medicaid Other | Source: Ambulatory Visit

## 2016-01-08 DIAGNOSIS — E538 Deficiency of other specified B group vitamins: Secondary | ICD-10-CM

## 2016-01-08 MED ORDER — CYANOCOBALAMIN 1000 MCG/ML IJ SOLN
1000.0000 ug | Freq: Once | INTRAMUSCULAR | Status: AC
  Administered 2016-01-08: 1000 ug via INTRAMUSCULAR

## 2016-01-14 ENCOUNTER — Encounter: Payer: Self-pay | Admitting: Family Medicine

## 2016-01-14 ENCOUNTER — Ambulatory Visit (INDEPENDENT_AMBULATORY_CARE_PROVIDER_SITE_OTHER): Payer: Medicaid Other | Admitting: *Deleted

## 2016-01-14 DIAGNOSIS — E538 Deficiency of other specified B group vitamins: Secondary | ICD-10-CM | POA: Diagnosis not present

## 2016-01-14 MED ORDER — CYANOCOBALAMIN 1000 MCG/ML IJ SOLN
1000.0000 ug | Freq: Once | INTRAMUSCULAR | Status: AC
  Administered 2016-01-14: 1000 ug via INTRAMUSCULAR

## 2016-01-15 ENCOUNTER — Ambulatory Visit (INDEPENDENT_AMBULATORY_CARE_PROVIDER_SITE_OTHER): Payer: Medicaid Other | Admitting: *Deleted

## 2016-01-15 DIAGNOSIS — E538 Deficiency of other specified B group vitamins: Secondary | ICD-10-CM | POA: Diagnosis not present

## 2016-01-15 MED ORDER — CYANOCOBALAMIN 1000 MCG/ML IJ SOLN
1000.0000 ug | Freq: Once | INTRAMUSCULAR | Status: AC
  Administered 2016-01-15: 1000 ug via INTRAMUSCULAR

## 2016-01-18 ENCOUNTER — Ambulatory Visit (INDEPENDENT_AMBULATORY_CARE_PROVIDER_SITE_OTHER): Payer: Medicaid Other

## 2016-01-18 DIAGNOSIS — E538 Deficiency of other specified B group vitamins: Secondary | ICD-10-CM | POA: Diagnosis not present

## 2016-01-18 MED ORDER — CYANOCOBALAMIN 1000 MCG/ML IJ SOLN
1000.0000 ug | Freq: Once | INTRAMUSCULAR | Status: AC
  Administered 2016-01-18: 1000 ug via INTRAMUSCULAR

## 2016-01-19 ENCOUNTER — Ambulatory Visit (INDEPENDENT_AMBULATORY_CARE_PROVIDER_SITE_OTHER): Payer: Medicaid Other | Admitting: *Deleted

## 2016-01-19 DIAGNOSIS — E538 Deficiency of other specified B group vitamins: Secondary | ICD-10-CM | POA: Diagnosis not present

## 2016-01-19 MED ORDER — CYANOCOBALAMIN 1000 MCG/ML IJ SOLN
1000.0000 ug | Freq: Once | INTRAMUSCULAR | Status: AC
  Administered 2016-01-19: 1000 ug via INTRAMUSCULAR

## 2016-01-20 ENCOUNTER — Ambulatory Visit (INDEPENDENT_AMBULATORY_CARE_PROVIDER_SITE_OTHER): Payer: Medicaid Other | Admitting: *Deleted

## 2016-01-20 DIAGNOSIS — E538 Deficiency of other specified B group vitamins: Secondary | ICD-10-CM

## 2016-01-20 MED ORDER — CYANOCOBALAMIN 1000 MCG/ML IJ SOLN: 1000.0000 ug | Freq: Once | INTRAMUSCULAR | Status: DC

## 2016-01-21 ENCOUNTER — Ambulatory Visit: Payer: Medicaid Other

## 2016-01-22 ENCOUNTER — Ambulatory Visit (INDEPENDENT_AMBULATORY_CARE_PROVIDER_SITE_OTHER): Payer: Medicaid Other | Admitting: *Deleted

## 2016-01-22 DIAGNOSIS — E538 Deficiency of other specified B group vitamins: Secondary | ICD-10-CM

## 2016-01-22 MED ORDER — CYANOCOBALAMIN 1000 MCG/ML IJ SOLN
1000.0000 ug | Freq: Once | INTRAMUSCULAR | Status: AC
  Administered 2016-01-22: 1000 ug via INTRAMUSCULAR

## 2016-01-29 ENCOUNTER — Ambulatory Visit (INDEPENDENT_AMBULATORY_CARE_PROVIDER_SITE_OTHER): Payer: Medicaid Other | Admitting: *Deleted

## 2016-01-29 DIAGNOSIS — E538 Deficiency of other specified B group vitamins: Secondary | ICD-10-CM

## 2016-01-29 MED ORDER — CYANOCOBALAMIN 1000 MCG/ML IJ SOLN
1000.0000 ug | Freq: Once | INTRAMUSCULAR | Status: AC
  Administered 2016-01-29: 1000 ug via INTRAMUSCULAR

## 2016-02-05 ENCOUNTER — Ambulatory Visit (INDEPENDENT_AMBULATORY_CARE_PROVIDER_SITE_OTHER): Payer: Medicaid Other | Admitting: *Deleted

## 2016-02-05 DIAGNOSIS — E538 Deficiency of other specified B group vitamins: Secondary | ICD-10-CM

## 2016-02-05 MED ORDER — CYANOCOBALAMIN 1000 MCG/ML IJ SOLN
1000.0000 ug | Freq: Once | INTRAMUSCULAR | Status: AC
  Administered 2016-02-05: 1000 ug via INTRAMUSCULAR

## 2016-02-12 ENCOUNTER — Ambulatory Visit: Payer: Medicaid Other | Admitting: Neurology

## 2016-02-12 ENCOUNTER — Ambulatory Visit (INDEPENDENT_AMBULATORY_CARE_PROVIDER_SITE_OTHER): Payer: Medicaid Other | Admitting: *Deleted

## 2016-02-12 DIAGNOSIS — E538 Deficiency of other specified B group vitamins: Secondary | ICD-10-CM | POA: Diagnosis not present

## 2016-02-12 MED ORDER — CYANOCOBALAMIN 1000 MCG/ML IJ SOLN
1000.0000 ug | Freq: Once | INTRAMUSCULAR | Status: AC
  Administered 2016-02-12: 1000 ug via INTRAMUSCULAR

## 2016-02-19 ENCOUNTER — Ambulatory Visit
Admission: RE | Admit: 2016-02-19 | Discharge: 2016-02-19 | Disposition: A | Discharge status: Home / Self Care | Payer: Medicaid Other | Source: Ambulatory Visit | Attending: Neurology | Admitting: Neurology

## 2016-02-19 ENCOUNTER — Ambulatory Visit (INDEPENDENT_AMBULATORY_CARE_PROVIDER_SITE_OTHER): Payer: Medicaid Other | Admitting: *Deleted

## 2016-02-19 DIAGNOSIS — M542 Cervicalgia: Secondary | ICD-10-CM

## 2016-02-19 DIAGNOSIS — R296 Repeated falls: Secondary | ICD-10-CM

## 2016-02-19 DIAGNOSIS — R29818 Other symptoms and signs involving the nervous system: Secondary | ICD-10-CM

## 2016-02-19 DIAGNOSIS — E538 Deficiency of other specified B group vitamins: Secondary | ICD-10-CM | POA: Diagnosis not present

## 2016-02-19 DIAGNOSIS — R531 Weakness: Secondary | ICD-10-CM

## 2016-02-19 MED ORDER — CYANOCOBALAMIN 1000 MCG/ML IJ SOLN
1000.0000 ug | Freq: Once | INTRAMUSCULAR | Status: AC
  Administered 2016-02-19: 1000 ug via INTRAMUSCULAR

## 2016-02-20 ENCOUNTER — Other Ambulatory Visit: Payer: Self-pay | Admitting: Neurology

## 2016-02-20 DIAGNOSIS — R296 Repeated falls: Secondary | ICD-10-CM

## 2016-02-20 DIAGNOSIS — R29818 Other symptoms and signs involving the nervous system: Secondary | ICD-10-CM

## 2016-02-20 DIAGNOSIS — R531 Weakness: Secondary | ICD-10-CM

## 2016-02-20 DIAGNOSIS — M542 Cervicalgia: Secondary | ICD-10-CM

## 2016-02-25 ENCOUNTER — Telehealth: Payer: Self-pay | Admitting: Neurology

## 2016-02-25 NOTE — Telephone Encounter (Signed)
Suzanne [name] 1975-08-20. Her # is [phone]. She was calling regarding her results from her MRI. Thank you

## 2016-02-25 NOTE — Telephone Encounter (Signed)
Left message for patient to call me back. 

## 2016-02-26 ENCOUNTER — Ambulatory Visit (INDEPENDENT_AMBULATORY_CARE_PROVIDER_SITE_OTHER): Payer: Medicaid Other | Admitting: Neurology

## 2016-02-26 DIAGNOSIS — E538 Deficiency of other specified B group vitamins: Secondary | ICD-10-CM

## 2016-02-26 MED ORDER — CYANOCOBALAMIN 1000 MCG/ML IJ SOLN
1000.0000 ug | Freq: Once | INTRAMUSCULAR | Status: AC
  Administered 2016-02-26: 1000 ug via INTRAMUSCULAR

## 2016-02-26 NOTE — Progress Notes (Signed)
B12 injection to right deltoid with no apparent complications.   

## 2016-03-04 ENCOUNTER — Ambulatory Visit: Payer: Medicaid Other | Admitting: Neurology

## 2016-03-04 ENCOUNTER — Ambulatory Visit (INDEPENDENT_AMBULATORY_CARE_PROVIDER_SITE_OTHER): Payer: Medicaid Other

## 2016-03-04 DIAGNOSIS — E538 Deficiency of other specified B group vitamins: Secondary | ICD-10-CM

## 2016-03-04 MED ORDER — CYANOCOBALAMIN 1000 MCG/ML IJ SOLN
1000.0000 ug | Freq: Once | INTRAMUSCULAR | Status: AC
  Administered 2016-03-04: 1000 ug via INTRAMUSCULAR

## 2016-03-04 NOTE — Progress Notes (Signed)
Pt came in for B12 injection. While in office asked about a note for work as they are giving her a hard time about wearing her jacket. Pt stated the freezing is due to her low B12. Spoke with Dr. Posey Pronto to verbalized ok to write. Letter written and given to pt before she left office.

## 2016-03-04 NOTE — Progress Notes (Deleted)
Follow-up Visit   Date: 03/04/16    Suzanne [name] MRN: [id number] DOB: 29-Dec-1975   Interim History: Suzanne [name] is a 40 y.o. right-handed African American female with hypertension, GERD, tobacco user, and chronic low back pain returning to the clinic for follow-up of ***.  The patient was accompanied to the clinic by self.  History of present illness: She moved from [place] in 2011 and about a year later, she recalls having her first fall.  Since this time, she continues to have unprocoked falls about twice per year.  Initially, she attributed it to slipping on the floor or missteping, but since symptoms have increased, she was referred for further evaluation.  Her falls have occurred as follows: (1) first fall occurred while getting into the tub and her legs buckled, she had to sit for a while and then was able to stand, (2) she was pushing a cart at work and again suddenly her legs gave out, no associated pain (3) fell in the kitchen (4) another time she fell while getting out of the shower during which time she did have back pain, (5) in March 2017, she was standing on the porch talking to her mother and suddenly collapsed because her legs again buckled.   There was no associated back or leg pain.  She has occasion tingling of the toes on the left.  She is able to move her legs immediately following a falls and usually takes about 10-minutes for her to stand up again, which is due to pain.   She does not have bladder/bowel incontinence.   She has chronic neck and back pain and has seen Pain Management for [facility] and tried physical therapy.  She has had MRI of the lumbar region performed elsewhere and I do not have this to review.   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  UPDATE 03/04/2016:  At her last visit, her labs indicated vitamin B12 and D deficiency for which she was started on  supplementation.  Because of no improvement in her paresthesias, she also underwent MRI cervical spine which showed Mild posterior central disc protrusions at C4-5 and C5-6 and mild-moderate C3-4 left foraminal stenosis, which seems relatively stable as compared to her previous study.   Medications:  Current Outpatient Prescriptions on File Prior to Visit  Medication Sig Dispense Refill  . amLODipine (NORVASC) 10 MG tablet Take 1 tablet (10 mg total) by mouth daily. 30 tablet 0  . lansoprazole (PREVACID) 30 MG capsule Take 30 minutes prior to breakfast 90 capsule 3  . metroNIDAZOLE (FLAGYL) 500 MG tablet Take 1 tablet (500 mg total) by mouth 2 (two) times daily. 14 tablet 0  . valACYclovir (VALTREX) 1000 MG tablet Take 2 tablets (2,000 mg total) by mouth once. (Patient not taking: Reported on 12/24/2015) 2 tablet 0  . valsartan-hydrochlorothiazide (DIOVAN-HCT) 320-25 MG per tablet TAKE 1 TABLET BY MOUTH DAILY. 30 tablet 1  . Vitamin D, Ergocalciferol, (DRISDOL) 50000 units CAPS capsule Take 1 capsule (50,000 Units total) by mouth every 7 (seven) days. Take every week x 8 weeks then go to OTC vitamin D 2000 units daily. 8 capsule 0   Current Facility-Administered Medications on File Prior to Visit  Medication Dose Route Frequency Provider Last Rate Last Dose  . cyanocobalamin ((VITAMIN B-12)) injection 1,000 mcg  1,000 mcg Intramuscular Once Alda Berthold, DO  Allergies:  Allergies  Allergen Reactions  . Reglan [Metoclopramide] Shortness Of Breath  . Lisinopril Cough  . Losartan Cough  . Sulfa Antibiotics Nausea And Vomiting  . Amoxicillin Rash    Has patient had a PCN reaction causing immediate rash, facial/tongue/throat swelling, SOB or lightheadedness with hypotension: {Yes/No:[id number]} Has patient had a PCN reaction causing severe rash involving mucus membranes or skin necrosis: Broke bottom and face out.  Has patient had a PCN reaction that required hospitalization No Has  patient had a PCN reaction occurring within the last 10 years: No If all of the above answers are "NO", then may proceed with Cephalosporin use.   . Contrast Media [Iodinated Diagnostic Agents] Rash    Review of Systems:  CONSTITUTIONAL: No fevers, chills, night sweats, or weight loss.  EYES: No visual changes or eye pain ENT: No hearing changes.  No history of nose bleeds.   RESPIRATORY: No cough, wheezing and shortness of breath.   CARDIOVASCULAR: Negative for chest pain, and palpitations.   GI: Negative for abdominal discomfort, blood in stools or black stools.  No recent change in bowel habits.   GU:  No history of incontinence.   MUSCLOSKELETAL: No history of joint pain or swelling.  No myalgias.   SKIN: Negative for lesions, rash, and itching.   ENDOCRINE: Negative for cold or heat intolerance, polydipsia or goiter.   PSYCH:  *** depression or anxiety symptoms.   NEURO: As Above.   Vital Signs:  There were no vitals taken for this visit.  Neurological Exam: MENTAL STATUS including orientation to time, place, person, recent and remote memory, attention span and concentration, language, and fund of knowledge is normal.  Speech is not dysarthric.  CRANIAL NERVES: Pupils equal round and reactive to light.  Normal conjugate, extra-ocular eye movements in all directions of gaze.  No ptosis.  Face is symmetric.   MOTOR:  Motor strength is 5/5 in all extremities, except ***.  No atrophy, fasciculations or abnormal movements.  No pronator drift.  Tone is normal.    MSRs:  Reflexes are 2+/4 throughout, except ***.  SENSORY:  Intact to ***.  COORDINATION/GAIT:  Normal finger-to- nose-finger and heel-to-shin.  Intact rapid alternating movements bilaterally.  Gait narrow based and stable.   Data:*** MRI brain wo contrast 06/14/2011:  Normal  MRI cervical spine wo contrast 06/14/2011:  Mild posterior central disc protrusions at C4-5 and C5-6.  No spinal or foraminal stenosis. MRI lumbar  spine 06/14/2011:  L4-5 disc bulge and facet hypertrophy with moderate biforaminal stenosis; L5-S1 disc bulge with no spinal or foraminal stenosis.  MRI cervical spine wo contrast 02/20/2016: 1. Small central disc extrusion at C5-6 with mild spinal stenosis and mild spinal cord mass effect. 2. Mild bilateral foraminal stenosis and borderline spinal stenosis at C4-5. 3. Mild-to-moderate left foraminal stenosis at C3-4 due to uncovertebral disease.  Labs 12/24/2015:  ANA neg, vitamin D 13*, vitamin B12 205*, ESR 25, CRP 0.3, folate 9.3  IMPRESSION/PLAN: Ms. Badia is a 40 year-old female returning for evaluation of frequent falls.  Her neurological exam does not show any signs of myelopathy or weakness, but there is positive Lhermitte's sign ***.  Imaging of the cervical spine does not show any structural abnormalities which would cause her falls. She has mild disc protrusion at C4-5 and C5-6, unchanged from previously and mild left foraminal stenosis at C3-4.  Her labs showed vitamin D and B12 deficiency and she is being supplemented with orally and with injections, respectively.  With her normal exam and transient episodic symptoms, vascular abnormalities such as dural AV fistula is also considered.  Periodic paralysis is rare and less likely.  The next step will be imaging of the thoracic and lumbar spine.  PLAN/RECOMMENDATIONS:  1.  MRI thoracic and lumbar spine wwo contrast 2.  Continue vitamin B12 1068mg injections monthly 3.  Continue vitamin D 2000 units daily   The duration of this appointment visit was *** minutes of face-to-face time with the patient.  Greater than 50% of this time was spent in counseling, explanation of diagnosis, planning of further management, and coordination of care.   Thank you for allowing me to participate in patient's care.  If I can answer any additional questions, I would be pleased to do so.    Sincerely,    Mannix Kroeker K. PPosey Pronto DO

## 2016-05-06 ENCOUNTER — Ambulatory Visit: Payer: Medicaid Other | Admitting: Neurology

## 2016-05-10 ENCOUNTER — Encounter: Payer: Self-pay | Admitting: Neurology

## 2016-05-19 ENCOUNTER — Emergency Department (HOSPITAL_COMMUNITY): Payer: Self-pay

## 2016-05-19 ENCOUNTER — Emergency Department (HOSPITAL_COMMUNITY)
Admission: EM | Admit: 2016-05-19 | Discharge: 2016-05-19 | Disposition: A | Discharge status: Home / Self Care | Payer: Self-pay | Attending: Emergency Medicine | Admitting: Emergency Medicine

## 2016-05-19 ENCOUNTER — Encounter (HOSPITAL_COMMUNITY): Payer: Self-pay | Admitting: Emergency Medicine

## 2016-05-19 DIAGNOSIS — J45909 Unspecified asthma, uncomplicated: Secondary | ICD-10-CM | POA: Insufficient documentation

## 2016-05-19 DIAGNOSIS — R112 Nausea with vomiting, unspecified: Secondary | ICD-10-CM | POA: Insufficient documentation

## 2016-05-19 DIAGNOSIS — F17290 Nicotine dependence, other tobacco product, uncomplicated: Secondary | ICD-10-CM | POA: Insufficient documentation

## 2016-05-19 DIAGNOSIS — I10 Essential (primary) hypertension: Secondary | ICD-10-CM | POA: Insufficient documentation

## 2016-05-19 DIAGNOSIS — R0789 Other chest pain: Secondary | ICD-10-CM | POA: Insufficient documentation

## 2016-05-19 DIAGNOSIS — R634 Abnormal weight loss: Secondary | ICD-10-CM | POA: Insufficient documentation

## 2016-05-19 DIAGNOSIS — J069 Acute upper respiratory infection, unspecified: Secondary | ICD-10-CM | POA: Insufficient documentation

## 2016-05-19 LAB — CBC WITH DIFFERENTIAL/PLATELET
Basophils Absolute: 0 10*3/uL (ref 0.0–0.1)
Basophils Relative: 0 %
Eosinophils Absolute: 0.2 10*3/uL (ref 0.0–0.7)
Eosinophils Relative: 3 %
HCT: 33.9 % — ABNORMAL LOW (ref 36.0–46.0)
Hemoglobin: 11.5 g/dL — ABNORMAL LOW (ref 12.0–15.0)
Lymphocytes Relative: 45 %
Lymphs Abs: 2.9 10*3/uL (ref 0.7–4.0)
MCH: 23.6 pg — ABNORMAL LOW (ref 26.0–34.0)
MCHC: 33.9 g/dL (ref 30.0–36.0)
MCV: 69.6 fL — ABNORMAL LOW (ref 78.0–100.0)
Monocytes Absolute: 0.6 10*3/uL (ref 0.1–1.0)
Monocytes Relative: 10 %
Neutro Abs: 2.7 10*3/uL (ref 1.7–7.7)
Neutrophils Relative %: 42 %
Platelets: 198 10*3/uL (ref 150–400)
RBC: 4.87 MIL/uL (ref 3.87–5.11)
RDW: 15.1 % (ref 11.5–15.5)
WBC: 6.4 10*3/uL (ref 4.0–10.5)

## 2016-05-19 LAB — LIPASE, BLOOD: Lipase: 25 U/L (ref 11–51)

## 2016-05-19 LAB — URINALYSIS, ROUTINE W REFLEX MICROSCOPIC
Bilirubin Urine: NEGATIVE
Glucose, UA: NEGATIVE mg/dL
Hgb urine dipstick: NEGATIVE
Ketones, ur: 5 mg/dL — AB
Leukocytes, UA: NEGATIVE
Nitrite: NEGATIVE
Protein, ur: NEGATIVE mg/dL
Specific Gravity, Urine: 1.011 (ref 1.005–1.030)
pH: 9 — ABNORMAL HIGH (ref 5.0–8.0)

## 2016-05-19 LAB — COMPREHENSIVE METABOLIC PANEL
ALT: 15 U/L (ref 14–54)
AST: 20 U/L (ref 15–41)
Albumin: 4.1 g/dL (ref 3.5–5.0)
Alkaline Phosphatase: 52 U/L (ref 38–126)
Anion gap: 9 (ref 5–15)
BUN: 16 mg/dL (ref 6–20)
CO2: 25 mmol/L (ref 22–32)
Calcium: 9.1 mg/dL (ref 8.9–10.3)
Chloride: 104 mmol/L (ref 101–111)
Creatinine, Ser: 0.75 mg/dL (ref 0.44–1.00)
GFR calc Af Amer: >60 mL/min (ref >60)
GFR calc non Af Amer: >60 mL/min (ref >60)
Glucose, Bld: 90 mg/dL (ref 65–99)
Potassium: 3.2 mmol/L — ABNORMAL LOW (ref 3.5–5.1)
Sodium: 138 mmol/L (ref 135–145)
Total Bilirubin: 0.6 mg/dL (ref 0.3–1.2)
Total Protein: 7.4 g/dL (ref 6.5–8.1)

## 2016-05-19 MED ORDER — IPRATROPIUM-ALBUTEROL 0.5-2.5 (3) MG/3ML IN SOLN
3.0000 mL | Freq: Once | RESPIRATORY_TRACT | Status: AC
  Administered 2016-05-19: 3 mL via RESPIRATORY_TRACT
  Filled 2016-05-19: qty 3

## 2016-05-19 MED ORDER — ONDANSETRON HCL 4 MG/2ML IJ SOLN
4.0000 mg | Freq: Once | INTRAMUSCULAR | Status: AC
  Administered 2016-05-19: 4 mg via INTRAVENOUS
  Filled 2016-05-19: qty 2

## 2016-05-19 MED ORDER — IPRATROPIUM-ALBUTEROL 0.5-2.5 (3) MG/3ML IN SOLN: 3.0000 mL | Freq: Once | RESPIRATORY_TRACT | Status: DC

## 2016-05-19 MED ORDER — PROMETHAZINE HCL 25 MG PO TABS: 25.0000 mg | ORAL_TABLET | Freq: Four times a day (QID) | ORAL | 3 refills | Status: DC | PRN

## 2016-05-19 MED ORDER — SODIUM CHLORIDE 0.9 % IV SOLN
INTRAVENOUS | Status: DC
  Administered 2016-05-19: 08:00:00 via INTRAVENOUS

## 2016-05-19 MED ORDER — SODIUM CHLORIDE 0.9 % IV BOLUS (SEPSIS)
1000.0000 mL | Freq: Once | INTRAVENOUS | Status: AC
  Administered 2016-05-19: 1000 mL via INTRAVENOUS

## 2016-05-19 NOTE — ED Triage Notes (Signed)
Per pt, states asthma symptoms for 3 days-states cold symptoms which started this am, cough, night sweats states she has lost 30 pounds in 3 weeks-chest tightness, and pain all over

## 2016-05-19 NOTE — ED Provider Notes (Signed)
[facility] DEPT Provider Note   CSN: [id number] Arrival date & time: 05/19/16  [id number]     History   Chief Complaint Chief Complaint  Patient presents with  . URI    HPI Suzanne [name] is a 40 y.o. female.  Patient with three-week history of cough congestion also history of asthma. Patient also with night sweats claims that she's lost 80 pounds in the last 3 weeks. Patient was some intermittent chest discomfort lower part of the anterior abdomen and epigastric area when taking a deep breath. Also has myalgias. Patient primary care doctor is Dr. blunt.      Past Medical History:  Diagnosis Date  . Anemia   . Asthma   . Chronic lower back pain   . GERD (gastroesophageal reflux disease)   . Hypertension   . Migraine   . Neck pain, chronic     Patient Active Problem List   Diagnosis Date Noted  . Essential hypertension 07/02/2013  . Obesity (BMI 30-39.9) 07/02/2013  . Chest pain 07/02/2013  . Exertional shortness of breath 07/02/2013  . Other and unspecified ovarian cyst 04/01/2013  . Unspecified symptom associated with female genital organs 04/01/2013  . Dysphagia 12/22/2011    Past Surgical History:  Procedure Laterality Date  . ESOPHAGEAL DILATION    . ESOPHAGEAL MANOMETRY N/A 04/01/2013   Procedure: ESOPHAGEAL MANOMETRY (EM);  Surgeon: Milus Banister, MD;  Location: [facility];  Service: Endoscopy;  Laterality: N/A;  . ESOPHAGOGASTRODUODENOSCOPY (EGD) WITH ESOPHAGEAL DILATION N/A 02/28/2013   Procedure: ESOPHAGOGASTRODUODENOSCOPY (EGD) WITH ESOPHAGEAL DILATION;  Surgeon: Milus Banister, MD;  Location: [facility];  Service: Endoscopy;  Laterality: N/A;  . TUBAL LIGATION    . VAGINAL HYSTERECTOMY      OB History    Gravida Para Term Preterm AB Living   2 2   2        SAB TAB Ectopic Multiple Live Births                   Home Medications    Prior to Admission medications   Medication Sig Start Date End Date Taking? Authorizing Provider    acetaminophen-codeine (TYLENOL #4) 300-60 MG tablet Take 1 tablet by mouth every 4 (four) hours as needed for moderate pain.   Yes Historical Provider, MD  guaifenesin (ROBITUSSIN) 100 MG/5ML syrup Take 200 mg by mouth 3 (three) times daily as needed for cough.   Yes Historical Provider, MD  Multiple Vitamin (MULTIVITAMIN WITH MINERALS) TABS tablet Take 1 tablet by mouth daily.   Yes Historical Provider, MD  Phenyleph-Doxylamine-DM-APAP (ALKA-SELTZER PLS NIGHT CLD/FLU) 5-6.25-10-325 MG CAPS Take 1 capsule by mouth every 4 (four) hours as needed. For cold symptoms   Yes Historical Provider, MD  valsartan-hydrochlorothiazide (DIOVAN-HCT) 320-25 MG per tablet TAKE 1 TABLET BY MOUTH DAILY. 10/17/14  Yes Troy Sine, MD  amLODipine (NORVASC) 10 MG tablet Take 1 tablet (10 mg total) by mouth daily. Patient not taking: Reported on 05/19/2016 07/18/15   Olivia Canter Sam, PA-C  lansoprazole (PREVACID) 30 MG capsule Take 30 minutes prior to breakfast Patient not taking: Reported on 05/19/2016 10/23/14   Cecille Rubin P Hvozdovic, PA-C  metroNIDAZOLE (FLAGYL) 500 MG tablet Take 1 tablet (500 mg total) by mouth 2 (two) times daily. Patient not taking: Reported on 05/19/2016 07/18/15   Olivia Canter Sam, PA-C  promethazine (PHENERGAN) 25 MG tablet Take 1 tablet (25 mg total) by mouth every 6 (six) hours as needed for nausea or vomiting. 05/19/16  Fredia Sorrow, MD  valACYclovir (VALTREX) 1000 MG tablet Take 2 tablets (2,000 mg total) by mouth once. Patient not taking: Reported on 12/24/2015 07/18/15   Olivia Canter Sam, PA-C  Vitamin D, Ergocalciferol, (DRISDOL) 50000 units CAPS capsule Take 1 capsule (50,000 Units total) by mouth every 7 (seven) days. Take every week x 8 weeks then go to OTC vitamin D 2000 units daily. Patient not taking: Reported on 05/19/2016 01/06/16   Alda Berthold, DO    Family History Family History  Problem Relation Age of Onset  . Heart disease Father   . Hypertension Mother   . Hyperlipidemia Maternal  Grandmother   . Hypertension Maternal Grandmother   . Hypertension Paternal Grandmother   . Diabetes Paternal Grandmother   . Cancer - Lung Paternal Grandmother     Social History Social History  Substance Use Topics  . Smoking status: Current Some Day Smoker    Packs/day: 0.10    Years: 10.00    Types: Cigars  . Smokeless tobacco: Never Used     Comment: smokes 2 Black & Milds per day 08/19/13  . Alcohol use Yes     Comment: 1-2 times yearly     Allergies   Reglan [metoclopramide]; Lisinopril; Losartan; Sulfa antibiotics; Amoxicillin; and Contrast media [iodinated diagnostic agents]   Review of Systems Review of Systems  Constitutional: Positive for chills and unexpected weight change.  HENT: Positive for congestion.   Eyes: Negative for visual disturbance.  Respiratory: Positive for cough.   Cardiovascular: Positive for chest pain.  Gastrointestinal: Positive for abdominal pain, nausea and vomiting.  Genitourinary: Negative for dysuria.  Musculoskeletal: Positive for myalgias.  Skin: Negative for rash.  Neurological: Negative for headaches.  Hematological: Does not bruise/bleed easily.  Psychiatric/Behavioral: Negative for confusion.     Physical Exam Updated Vital Signs BP (!) 136/125   Pulse (!) 59   Temp 98.3 F (36.8 C) (Oral)   Resp 17   Ht 5\' 4"  (1.626 m)   SpO2 100%   Physical Exam  Constitutional: She is oriented to person, place, and time. She appears well-developed and well-nourished. No distress.  HENT:  Head: Normocephalic and atraumatic.  Mouth/Throat: Oropharynx is clear and moist.  Eyes: EOM are normal. Pupils are equal, round, and reactive to light.  Neck: Normal range of motion. Neck supple.  Cardiovascular: Normal rate, regular rhythm and normal heart sounds.   Pulmonary/Chest: Effort normal and breath sounds normal. No respiratory distress.  Abdominal: Soft. Bowel sounds are normal. There is no tenderness.  Musculoskeletal: Normal  range of motion. She exhibits no edema.  Neurological: She is alert and oriented to person, place, and time. No cranial nerve deficit or sensory deficit. She exhibits normal muscle tone. Coordination normal.  Skin: Skin is warm.  Nursing note and vitals reviewed.    ED Treatments / Results  Labs (all labs ordered are listed, but only abnormal results are displayed) Labs Reviewed  COMPREHENSIVE METABOLIC PANEL - Abnormal; Notable for the following:       Result Value   Potassium 3.2 (*)    All other components within normal limits  CBC WITH DIFFERENTIAL/PLATELET - Abnormal; Notable for the following:    Hemoglobin 11.5 (*)    HCT 33.9 (*)    MCV 69.6 (*)    MCH 23.6 (*)    All other components within normal limits  URINALYSIS, ROUTINE W REFLEX MICROSCOPIC - Abnormal; Notable for the following:    Color, Urine STRAW (*)  pH 9.0 (*)    Ketones, ur 5 (*)    All other components within normal limits  LIPASE, BLOOD    EKG  EKG Interpretation None       Radiology Ct Abdomen Pelvis Wo Contrast  Result Date: 05/19/2016 CLINICAL DATA:  Cough with night sweats and 30 pound weight loss in 3 weeks. Intravenous contrast apparently withheld secondary to iodinated contrast allergy. EXAM: CT ABDOMEN AND PELVIS WITHOUT CONTRAST TECHNIQUE: Multidetector CT imaging of the abdomen and pelvis was performed following the standard protocol without IV contrast. COMPARISON:  None. FINDINGS: Lower chest:  Unremarkable. Hepatobiliary: No focal abnormality in the liver on this study without intravenous contrast. Gallbladder is decompressed. No intrahepatic or extrahepatic biliary dilation. Pancreas: No focal mass lesion. No dilatation of the main duct. No intraparenchymal cyst. No peripancreatic edema. Spleen: No splenomegaly. No focal mass lesion. Adrenals/Urinary Tract: No adrenal nodule or mass. Kidneys are unremarkable. No evidence for hydroureter. The urinary bladder appears normal for the degree  of distention. Stomach/Bowel: Stomach is nondistended. No gastric wall thickening. No evidence of outlet obstruction. Duodenum is normally positioned as is the ligament of Treitz. No small bowel wall thickening. No small bowel dilatation. The terminal ileum is normal. The appendix is normal. No gross colonic mass. No colonic wall thickening. No substantial diverticular change. Vascular/Lymphatic: There is abdominal aortic atherosclerosis without aneurysm. There is no gastrohepatic or hepatoduodenal ligament lymphadenopathy. No intraperitoneal or retroperitoneal lymphadenopathy. No pelvic sidewall lymphadenopathy. Reproductive: Uterus is surgically absent. There is no adnexal mass. Other: No intraperitoneal free fluid. Musculoskeletal: Bone windows reveal no worrisome lytic or sclerotic osseous lesions. IMPRESSION: 1. No acute findings in the abdomen or pelvis. No findings to explain the patient's history of night sweats and weight loss. 2. Abdominal aortic atherosclerosis. Electronically Signed   By: Misty Stanley M.D.   On: 05/19/2016 12:57   Dg Chest 2 View  Result Date: 05/19/2016 CLINICAL DATA:  Cough and shortness of Breath EXAM: CHEST  2 VIEW COMPARISON:  07/21/2014 FINDINGS: The heart size and mediastinal contours are within normal limits. Both lungs are clear. The visualized skeletal structures are unremarkable. IMPRESSION: No active cardiopulmonary disease. Electronically Signed   By: Inez Catalina M.D.   On: 05/19/2016 07:48    Procedures Procedures (including critical care time)  Medications Ordered in ED Medications  0.9 %  sodium chloride infusion ( Intravenous Stopped 05/19/16 1356)  sodium chloride 0.9 % bolus 1,000 mL (0 mLs Intravenous Stopped 05/19/16 1009)  ondansetron (ZOFRAN) injection 4 mg (4 mg Intravenous Given 05/19/16 0810)  ipratropium-albuterol (DUONEB) 0.5-2.5 (3) MG/3ML nebulizer solution 3 mL (3 mLs Nebulization Given 05/19/16 0926)     Initial Impression /  Assessment and Plan / ED Course  I have reviewed the triage vital signs and the nursing notes.  Pertinent labs & imaging results that were available during my care of the patient were reviewed by me and considered in my medical decision making (see chart for details).  Clinical Course     Patient was several complaints. Most notable at patient states she's had asthma symptoms for 3 weeks. No wheezing noted here. Chest x-ray negative. Also had upper respiratory symptoms with cough night sweats. States that she has also had an 80 pound weight loss over the past 3 weeks. Patient has a long-standing history of persistent vomiting. Patient's primary care doctor is Dr. Ellsworth Lennox.  Patient's workup here to include chest x-ray CT abdomen and pelvis and labs without any acute abnormalities. No fever.  No evidence of pneumonia on chest x-ray. Nothing on CT of the abdomen to explain the weight loss.  Patient referred back to her primary care doctor for further evaluation. Patient will be treated with Phenergan. Patient also was some intermittent chest pain with taking a deep breath in the epigastric area is more consistent with chest wall pain.  Final Clinical Impressions(s) / ED Diagnoses   Final diagnoses:  Viral upper respiratory tract infection  Nausea and vomiting, intractability of vomiting not specified, unspecified vomiting type  Weight loss  Chest wall pain    New Prescriptions New Prescriptions   PROMETHAZINE (PHENERGAN) 25 MG TABLET    Take 1 tablet (25 mg total) by mouth every 6 (six) hours as needed for nausea or vomiting.     Fredia Sorrow, MD 05/19/16 1400

## 2016-05-19 NOTE — ED Notes (Signed)
Pt reminded of the need for urine.  

## 2016-05-19 NOTE — Discharge Instructions (Signed)
Although symptoms are very concerning. Workup without any acute findings to include CT of abdomen and pelvis. Chest x-ray also negative. Would recommend follow-up with your primary care doctor. They may consider reconsult in you to GI medicine. Take the Phenergan as needed for the nausea and vomiting. Work note provided.

## 2016-08-03 ENCOUNTER — Encounter: Payer: Self-pay | Admitting: Family Medicine

## 2016-08-03 ENCOUNTER — Ambulatory Visit (INDEPENDENT_AMBULATORY_CARE_PROVIDER_SITE_OTHER): Payer: BLUE CROSS/BLUE SHIELD | Admitting: Family Medicine

## 2016-08-03 VITALS — BP 162/94 | HR 68 | Temp 98.1°F | Ht 64.0 in | Wt 145.8 lb

## 2016-08-03 DIAGNOSIS — Z1231 Encounter for screening mammogram for malignant neoplasm of breast: Secondary | ICD-10-CM | POA: Diagnosis not present

## 2016-08-03 DIAGNOSIS — F17200 Nicotine dependence, unspecified, uncomplicated: Secondary | ICD-10-CM | POA: Diagnosis not present

## 2016-08-03 DIAGNOSIS — E538 Deficiency of other specified B group vitamins: Secondary | ICD-10-CM

## 2016-08-03 DIAGNOSIS — K909 Intestinal malabsorption, unspecified: Secondary | ICD-10-CM

## 2016-08-03 DIAGNOSIS — R110 Nausea: Secondary | ICD-10-CM

## 2016-08-03 DIAGNOSIS — R634 Abnormal weight loss: Secondary | ICD-10-CM

## 2016-08-03 DIAGNOSIS — Z1239 Encounter for other screening for malignant neoplasm of breast: Secondary | ICD-10-CM

## 2016-08-03 DIAGNOSIS — K529 Noninfective gastroenteritis and colitis, unspecified: Secondary | ICD-10-CM

## 2016-08-03 DIAGNOSIS — D509 Iron deficiency anemia, unspecified: Secondary | ICD-10-CM

## 2016-08-03 DIAGNOSIS — R911 Solitary pulmonary nodule: Secondary | ICD-10-CM | POA: Diagnosis not present

## 2016-08-03 DIAGNOSIS — IMO0001 Reserved for inherently not codable concepts without codable children: Secondary | ICD-10-CM

## 2016-08-03 LAB — COMPREHENSIVE METABOLIC PANEL
ALT: 10 U/L (ref 0–35)
AST: 15 U/L (ref 0–37)
Albumin: 3.9 g/dL (ref 3.5–5.2)
Alkaline Phosphatase: 55 U/L (ref 39–117)
BUN: 9 mg/dL (ref 6–23)
CO2: 29 mEq/L (ref 19–32)
Calcium: 9.2 mg/dL (ref 8.4–10.5)
Chloride: 107 mEq/L (ref 96–112)
Creatinine, Ser: 0.66 mg/dL (ref 0.40–1.20)
GFR: 126.92 mL/min (ref >60.00)
Glucose, Bld: 86 mg/dL (ref 70–99)
Potassium: 3.7 mEq/L (ref 3.5–5.1)
Sodium: 139 mEq/L (ref 135–145)
Total Bilirubin: 0.3 mg/dL (ref 0.2–1.2)
Total Protein: 6.8 g/dL (ref 6.0–8.3)

## 2016-08-03 LAB — CBC WITH DIFFERENTIAL/PLATELET
Basophils Absolute: 0 10*3/uL (ref 0.0–0.1)
Basophils Relative: 0.3 % (ref 0.0–3.0)
Eosinophils Absolute: 0.3 10*3/uL (ref 0.0–0.7)
Eosinophils Relative: 5.2 % — ABNORMAL HIGH (ref 0.0–5.0)
HCT: 33.6 % — ABNORMAL LOW (ref 36.0–46.0)
Hemoglobin: 10.8 g/dL — ABNORMAL LOW (ref 12.0–15.0)
Lymphocytes Relative: 31.3 % (ref 12.0–46.0)
Lymphs Abs: 2 10*3/uL (ref 0.7–4.0)
MCHC: 32.1 g/dL (ref 30.0–36.0)
MCV: 73.9 fl — ABNORMAL LOW (ref 78.0–100.0)
Monocytes Absolute: 0.5 10*3/uL (ref 0.1–1.0)
Monocytes Relative: 7.6 % (ref 3.0–12.0)
Neutro Abs: 3.5 10*3/uL (ref 1.4–7.7)
Neutrophils Relative %: 55.6 % (ref 43.0–77.0)
Platelets: 196 10*3/uL (ref 150.0–400.0)
RBC: 4.56 Mil/uL (ref 3.87–5.11)
RDW: 15.1 % (ref 11.5–15.5)
WBC: 6.2 10*3/uL (ref 4.0–10.5)

## 2016-08-03 LAB — TSH: TSH: 0.84 u[IU]/mL (ref 0.35–4.50)

## 2016-08-03 LAB — T4, FREE: Free T4: 0.89 ng/dL (ref 0.60–1.60)

## 2016-08-03 NOTE — Progress Notes (Signed)
Pre visit review using our clinic review tool, if applicable. No additional management support is needed unless otherwise documented below in the visit note. 

## 2016-08-06 DIAGNOSIS — R110 Nausea: Secondary | ICD-10-CM | POA: Insufficient documentation

## 2016-08-06 DIAGNOSIS — D509 Iron deficiency anemia, unspecified: Secondary | ICD-10-CM | POA: Insufficient documentation

## 2016-08-06 DIAGNOSIS — K909 Intestinal malabsorption, unspecified: Secondary | ICD-10-CM | POA: Insufficient documentation

## 2016-08-06 DIAGNOSIS — IMO0001 Reserved for inherently not codable concepts without codable children: Secondary | ICD-10-CM | POA: Insufficient documentation

## 2016-08-06 DIAGNOSIS — R911 Solitary pulmonary nodule: Secondary | ICD-10-CM | POA: Insufficient documentation

## 2016-08-06 DIAGNOSIS — K529 Noninfective gastroenteritis and colitis, unspecified: Secondary | ICD-10-CM | POA: Insufficient documentation

## 2016-08-06 MED ORDER — FERROUS SULFATE 325 (65 FE) MG PO TABS: 325.0000 mg | ORAL_TABLET | Freq: Every day | ORAL | 3 refills | Status: DC

## 2016-08-06 NOTE — Progress Notes (Signed)
Suzanne [name] is a 41 y.o. female is here to [facility].   History of Present Illness:   Abnormal intentional weight loss. Rapid. Patient describes daily nausea related to eating. Also endorses daily loose to watery stools, worse after eating. No abdominal pain. Previous upper GI work-up for dysphagia. More recently, she has been seen by Neurology for frequently falls - normal MRIs, found to have multiple vitamin/mineral deficiencies.  Review of Chart:   12/22/2011: GI evaluation for dysphagia. EGD: Benign smooth narrowing at GE junction dilated to 20 mm. Mild improvement in symptoms. 02/26/2013: ED visit for epigastric pain. Repeat EGD on 02/28/2013 found slightly narrow, smooth GE junction, dilated to 11mm. CT scan, esophageal manometry testing to check for achalasia: Normal non contrast CT for thoracic esophagus without mass. Manometry: normal esophageal peristalsis with mildly elevated residual LES pressure consistent with hypertensive LES (mild EGJ outflow obstruction).   12/03/14: Gastroenterology evaluation for dyspepsia, dysphagia. EGD: Normal appearing esophagus and GE junction, the stomach was well visualized and normal in appearance, normal appearing duodenum. Recommendations: Prilosec BID, Zantac q hs, small bites and eat slowly, dentures that fit well.  12/23/16: Neurology evaluation for frequent falls. Concern for MS on exam. MRI cervical spine on 02/19/16: 1. Small central disc extrusion at C5-6 with mild spinal stenosis and mild spinal cord mass effect. 2. Mild bilateral foraminal stenosis and borderline spinal stenosis at C4-5. 3. Mild-to-moderate left foraminal stenosis at C3-4 due to uncovertebral disease. Vitamin D 13.56. B12 205. Replaced vitamin D via high dose supplementation and B12 via injections. Previous MRIs of T-L spines reviewed by Neurologist and reported to be negative.   Health Maintenance Due  Topic Date Due  . TETANUS/TDAP  07/29/1994    PMHx, SurgHx,  SocialHx, Medications, and Allergies were reviewed in the Visit Navigator and updated as appropriate.   Past Medical History:  Diagnosis Date  . Allergy   . Anemia   . Anxiety   . Arthritis   . Asthma   . Chronic lower back pain   . Depression   . GERD (gastroesophageal reflux disease)   . Hypertension   . Migraine   . Neck pain, chronic     Past Surgical History:  Procedure Laterality Date  . ESOPHAGEAL DILATION    . ESOPHAGEAL MANOMETRY N/A 04/01/2013  . ESOPHAGOGASTRODUODENOSCOPY (EGD) WITH ESOPHAGEAL DILATION N/A 02/28/2013  . TUBAL LIGATION    . VAGINAL HYSTERECTOMY      Family History  Problem Relation Age of Onset  . Heart disease Father   . Hypertension Mother   . Hyperlipidemia Maternal Grandmother   . Hypertension Maternal Grandmother   . Hypertension Paternal Grandmother   . Diabetes Paternal Grandmother   . Cancer - Lung Paternal Grandmother     Social History  Substance Use Topics  . Smoking status: Current Some Day Smoker    Packs/day: 0.10    Years: 10.00    Types: Cigars  . Smokeless tobacco: Never Used     Comment: smokes 2 Black & Milds per day 08/19/13  . Alcohol use Yes     Comment: 1-2 times yearly   Note: When directly asked, patient admits to smoking marijuana, but states that it is not regularly. She does not notice any pattern of nausea related to use.  Current Medications and Allergies:   .  amLODipine (NORVASC) 10 MG tablet, Take 1 tablet (10 mg total) by mouth daily., Disp: 30 tablet, Rfl: 0 .  lansoprazole (PREVACID) 30 MG capsule,  Take 30 minutes prior to breakfast, Disp: 90 capsule, Rfl: 3 .  Multiple Vitamin (MULTIVITAMIN WITH MINERALS) TABS tablet, Take 1 tablet by mouth daily., Disp: , Rfl:  .  promethazine (PHENERGAN) 25 MG tablet, Take 1 tablet (25 mg total) by mouth every 6 (six) hours as needed for nausea or vomiting., Disp: 12 tablet, Rfl: 3 .  valsartan-hydrochlorothiazide (DIOVAN-HCT) 320-25 MG per tablet, TAKE 1 TABLET BY  MOUTH DAILY., Disp: 30 tablet, Rfl: 1   Allergies  Allergen Reactions  . Reglan [Metoclopramide] Shortness Of Breath  . Lisinopril Cough  . Losartan Cough  . Sulfa Antibiotics Nausea And Vomiting  . Amoxicillin Rash  . Contrast Media [Iodinated Diagnostic Agents] Rash     Review of Systems:   Review of Systems  Constitutional: Positive for malaise/fatigue and weight loss. Negative for chills and fever.  Eyes: Negative for blurred vision.  Respiratory: Negative for cough and shortness of breath.   Cardiovascular: Negative for chest pain, palpitations and leg swelling.  Gastrointestinal: Positive for diarrhea, heartburn and nausea. Negative for abdominal pain, blood in stool, constipation, melena and vomiting.  Genitourinary: Negative for dysuria, flank pain, hematuria and urgency.  Musculoskeletal: Negative for joint pain and myalgias.  Skin: Negative for rash.  Neurological: Negative for dizziness and headaches.  Endo/Heme/Allergies: Does not bruise/bleed easily.  Psychiatric/Behavioral: Positive for substance abuse. Negative for depression, hallucinations, memory loss and suicidal ideas. The patient is not nervous/anxious and does not have insomnia.    Vitals:   Vitals:   08/03/16 1017  BP: (!) 162/94  Pulse: 68  Temp: 98.1 F (36.7 C)  TempSrc: Oral  Weight: 145 lb 12.8 oz (66.1 kg)  Height: 5\' 4"  (1.626 m)     Body mass index is 25.03 kg/m.   Physical Exam:   Physical Exam  Constitutional: She is oriented to person, place, and time. She appears well-developed. No distress.  HENT:  Head: Normocephalic and atraumatic.  Right Ear: External ear normal.  Left Ear: External ear normal.  Nose: Nose normal.  Mouth/Throat: Oropharynx is clear and moist.  Eyes: Conjunctivae and EOM are normal. Pupils are equal, round, and reactive to light.  Neck: Normal range of motion. Neck supple. No thyromegaly present.  Cardiovascular: Normal rate, regular rhythm, normal heart  sounds and intact distal pulses.   Pulmonary/Chest: Effort normal and breath sounds normal.  Abdominal: Soft. Bowel sounds are normal.  Musculoskeletal: Normal range of motion.  Lymphadenopathy:    She has no cervical adenopathy.  Neurological: She is alert and oriented to person, place, and time.  Skin: Skin is warm and dry. Capillary refill takes less than 2 seconds.  Psychiatric: She has a normal mood and affect. Her behavior is normal. Judgment and thought content normal.   Assessment and Plan:   Renna was seen today for weight loss.  Diagnoses and all orders for this visit:  Abnormal intentional weight loss Comments: Labs as below. She will focus on protein intake at each meal. Red flags reviewed.  Orders: -     Comprehensive metabolic panel -     T4, free -     TSH -     CBC with Differential/Platelet -     Ambulatory referral to Gastroenterology  Intestinal malabsorption, unspecified type Comments: Evidenced by mulitple vitamin deficiency and rapid weight loss.   Screening for breast cancer Comments: Will go ahead and start screening during this work-up for unintentional weight loss.  Orders: -     MM SCREENING BREAST TOMO  BILATERAL; Future  Chronic diarrhea Comments: Worse after treatment with antibiotic. Stool studies pending.  Orders: -     Clostridium Difficile by PCR; Future -     Ova and parasite examination; Future -     Stool culture; Future  Microcytic anemia Comments: Will have patient restart iron supplentation.  Orders: -     ferrous sulfate 325 (65 FE) MG tablet; Take 1 tablet (325 mg total) by mouth daily with breakfast.  Daily nausea Comments: Controlled with phenergan, taking daily at this point.   B12 deficiency Comments: Will offer monthly injections and recheck in 3 months.  Smoker Comments: I advised patient to quit smoking, and offered support. [place] QUITLINE: 1-800-QUIT-NOW (614) 334-5046).   . Reviewed expectations re:  course of current medical issues. . Discussed self-management of symptoms. . Outlined signs and symptoms indicating need for more acute intervention. . Patient verbalized understanding and all questions were answered. . See orders for this visit as documented in the electronic medical record. . Patient received an After Visit Summary.  Records requested if needed. I spent 45 minutes with this patient, greater than 50% was face-to-face time counseling regarding the above diagnoses.  Briscoe Deutscher, Junior, [place] 08/06/2016  Follow-up: Return in about 1 month (around 09/03/2016).  Meds ordered this encounter  Medications  . ferrous sulfate 325 (65 FE) MG tablet    Sig: Take 1 tablet (325 mg total) by mouth daily with breakfast.    Dispense:  30 tablet    Refill:  3   Medications Discontinued During This Encounter  Medication Reason  . acetaminophen-codeine (TYLENOL #4) 300-60 MG tablet Error  . guaifenesin (ROBITUSSIN) 100 MG/5ML syrup Error  . metroNIDAZOLE (FLAGYL) 500 MG tablet Error  . Phenyleph-Doxylamine-DM-APAP (ALKA-SELTZER PLS NIGHT CLD/FLU) 5-6.25-10-325 MG CAPS Error  . valACYclovir (VALTREX) 1000 MG tablet Error  . Vitamin D, Ergocalciferol, (DRISDOL) 50000 units CAPS capsule   . cyanocobalamin ((VITAMIN B-12)) injection 1,000 mcg    Orders Placed This Encounter  Procedures  . Clostridium Difficile by PCR  . Ova and parasite examination  . Stool culture  . MM SCREENING BREAST TOMO BILATERAL  . Comprehensive metabolic panel  . T4, free  . TSH  . CBC with Differential/Platelet  . Ambulatory referral to Gastroenterology

## 2016-08-09 ENCOUNTER — Telehealth: Payer: Self-pay | Admitting: Family Medicine

## 2016-08-09 ENCOUNTER — Ambulatory Visit: Payer: Self-pay | Admitting: Family Medicine

## 2016-08-09 NOTE — Telephone Encounter (Signed)
Called and left voicemail on patients voicemail per patient. Informing her of results and that if there are any questions to contact the office.

## 2016-08-09 NOTE — Telephone Encounter (Signed)
Patient returning call RE lab results. Patient advised that it's ok to leave results on her voicemail.  Thank you,  -LL

## 2016-08-10 ENCOUNTER — Other Ambulatory Visit: Payer: BLUE CROSS/BLUE SHIELD

## 2016-08-10 DIAGNOSIS — K529 Noninfective gastroenteritis and colitis, unspecified: Secondary | ICD-10-CM | POA: Diagnosis not present

## 2016-08-11 ENCOUNTER — Ambulatory Visit: Payer: BLUE CROSS/BLUE SHIELD | Admitting: Gastroenterology

## 2016-08-11 LAB — CLOSTRIDIUM DIFFICILE BY PCR: Toxigenic C. Difficile by PCR: NOT DETECTED

## 2016-08-12 ENCOUNTER — Encounter: Payer: Self-pay | Admitting: Gastroenterology

## 2016-08-12 ENCOUNTER — Ambulatory Visit (INDEPENDENT_AMBULATORY_CARE_PROVIDER_SITE_OTHER): Payer: BLUE CROSS/BLUE SHIELD | Admitting: Neurology

## 2016-08-12 ENCOUNTER — Encounter: Payer: Self-pay | Admitting: Neurology

## 2016-08-12 ENCOUNTER — Other Ambulatory Visit (INDEPENDENT_AMBULATORY_CARE_PROVIDER_SITE_OTHER): Payer: BLUE CROSS/BLUE SHIELD

## 2016-08-12 ENCOUNTER — Ambulatory Visit (INDEPENDENT_AMBULATORY_CARE_PROVIDER_SITE_OTHER): Payer: BLUE CROSS/BLUE SHIELD | Admitting: Gastroenterology

## 2016-08-12 ENCOUNTER — Telehealth: Payer: Self-pay | Admitting: Family Medicine

## 2016-08-12 VITALS — BP 138/86 | HR 72 | Ht 64.25 in | Wt 144.2 lb

## 2016-08-12 VITALS — BP 140/90 | Temp 98.5°F | Ht 64.25 in | Wt 143.1 lb

## 2016-08-12 DIAGNOSIS — R202 Paresthesia of skin: Secondary | ICD-10-CM

## 2016-08-12 DIAGNOSIS — R197 Diarrhea, unspecified: Secondary | ICD-10-CM

## 2016-08-12 DIAGNOSIS — R52 Pain, unspecified: Secondary | ICD-10-CM

## 2016-08-12 DIAGNOSIS — E538 Deficiency of other specified B group vitamins: Secondary | ICD-10-CM | POA: Diagnosis not present

## 2016-08-12 DIAGNOSIS — R1084 Generalized abdominal pain: Secondary | ICD-10-CM

## 2016-08-12 DIAGNOSIS — R634 Abnormal weight loss: Secondary | ICD-10-CM | POA: Diagnosis not present

## 2016-08-12 DIAGNOSIS — R531 Weakness: Secondary | ICD-10-CM | POA: Diagnosis not present

## 2016-08-12 LAB — IGA: IgA: 160 mg/dL (ref 68–378)

## 2016-08-12 LAB — OVA AND PARASITE EXAMINATION: OP: NONE SEEN

## 2016-08-12 MED ORDER — CYANOCOBALAMIN 1000 MCG/ML IJ SOLN
1000.0000 ug | Freq: Once | INTRAMUSCULAR | Status: AC
  Administered 2016-08-12: 1000 ug via INTRAMUSCULAR

## 2016-08-12 MED ORDER — NA SULFATE-K SULFATE-MG SULF 17.5-3.13-1.6 GM/177ML PO SOLN: 1.0000 | ORAL | 0 refills | Status: DC

## 2016-08-12 NOTE — Progress Notes (Signed)
Follow-up Visit   Date: 08/12/16    Suzanne [name] MRN: [id number] DOB: Sep 29, 1975   Interim History: Suzanne [name] is a 41 y.o. right-handed African American female with hypertension, GERD, tobacco user, and chronic low back pain returning to the clinic with new complaints of whole body pain.  The patient was accompanied to the clinic by self.  History of present illness: Initial visit for falls and back pain 12/24/2015:  She moved from [place] in 2011 and about a year later, she recalls having her first fall.  Since this time, she continues to have unprocoked falls about twice per year.  Initially, she attributed it to slipping on the floor or missteping, but since symptoms have increased, she was referred for further evaluation.  Her falls have occurred as follows: (1) first fall occurred while getting into the tub and her legs buckled, she had to sit for a while and then was able to stand, (2) she was pushing a cart at work and again suddenly her legs gave out, no associated pain (3) fell in the kitchen (4) another time she fell while getting out of the shower during which time she did have back pain, (5) in March 2017, she was standing on the porch talking to her mother and suddenly collapsed because her legs again buckled.   There was no associated back or leg pain.  She has occasion tingling of the toes on the left.  She is able to move her legs immediately following a falls and usually takes about 10-minutes for her to stand up again, which is due to pain.   She has chronic neck and back pain and has seen Pain Management for [facility] and tried physical therapy.    UPDATE 08/12/2016:  She returns for follow-up visit and complains of whole body pain, worse neck, hands, and legs.  Pain is sharp down her legs.  She has tingling in her fingers. Her cervical and lumbar imaging does not disclose nerve impingement.  She has a lot of tenderness over the muscles and has been diagnosed with  fibromyalgia in the past.  She tried several medications and saw pain management, but is not taking any medications for fibromyalgia currently.  She had lost insurance a few months ago, and was unable to continue her vitamin B12 injections.  She is also being evaluated for unintentional weight loss and has a colonoscopy coming up.  Medications:  Current Outpatient Prescriptions on File Prior to Visit  Medication Sig Dispense Refill  . amLODipine (NORVASC) 10 MG tablet Take 1 tablet (10 mg total) by mouth daily. 30 tablet 0  . ferrous sulfate 325 (65 FE) MG tablet Take 1 tablet (325 mg total) by mouth daily with breakfast. 30 tablet 3  . lansoprazole (PREVACID) 30 MG capsule Take 30 minutes prior to breakfast 90 capsule 3  . Multiple Vitamin (MULTIVITAMIN WITH MINERALS) TABS tablet Take 1 tablet by mouth daily.    . Na Sulfate-K Sulfate-Mg Sulf 17.5-3.13-1.6 GM/180ML SOLN Take 1 kit by mouth as directed. 354 mL 0  . promethazine (PHENERGAN) 25 MG tablet Take 1 tablet (25 mg total) by mouth every 6 (six) hours as needed for nausea or vomiting. 12 tablet 3  . valsartan-hydrochlorothiazide (DIOVAN-HCT) 320-25 MG per tablet TAKE 1 TABLET BY MOUTH DAILY. 30 tablet 1   No current facility-administered medications on file prior to visit.     Allergies:  Allergies  Allergen Reactions  . Reglan [Metoclopramide] Shortness Of Breath  .  Lisinopril Cough  . Losartan Cough  . Sulfa Antibiotics Nausea And Vomiting  . Amoxicillin Rash       . Contrast Media [Iodinated Diagnostic Agents] Rash    Review of Systems:  CONSTITUTIONAL: No fevers, chills, night sweats, +weight loss.  EYES: No visual changes or eye pain ENT: No hearing changes.  No history of nose bleeds.   RESPIRATORY: No cough, wheezing and shortness of breath.   CARDIOVASCULAR: Negative for chest pain, and palpitations.   GI: Negative for abdominal discomfort, blood in stools or black stools.  No recent change in bowel habits.   GU:   No history of incontinence.   MUSCLOSKELETAL: No history of joint pain or swelling.  +myalgias.   SKIN: Negative for lesions, rash, and itching.   ENDOCRINE: Negative for cold or heat intolerance, polydipsia or goiter.   PSYCH:  No depression or anxiety symptoms.   NEURO: As Above.   Vital Signs:  BP 140/90   Temp 98.5 F (36.9 C)   Ht 5' 4.25" (1.632 m)   Wt 143 lb 1 oz (64.9 kg)   BMI 24.37 kg/m   Neurological Exam: MENTAL STATUS including orientation to time, place, person, recent and remote memory, attention span and concentration, language, and fund of knowledge is normal.  Speech is not dysarthric.  CRANIAL NERVES:  Pupils equal round and reactive to light.  Normal conjugate, extra-ocular eye movements in all directions of gaze.  No ptosis . Normal facial sensation.  Face is symmetric. Palate elevates symmetrically.  Tongue is midline.  MOTOR:  Motor strength is 5/5 in all extremities. She is tender to palpation over multiple fibromylagia points.  No atrophy, fasciculations or abnormal movements.  No pronator drift.  Tone is normal.    MSRs:  Reflexes are 2+/4 throughout.  SENSORY:  Intact to vibration throughout.  COORDINATION/GAIT:  Normal finger-to- nose-finger.  Gait narrow based and stable. Stressed and tandem gait intact  Data: Labs 12/24/2015:  Vitamin B12 205*, TSH 0.84, CRP 0.3, ANA neg, ESR 25, folate 9.3  MRI cervical spine wwo contrast 02/20/2016:   1. Small central disc extrusion at C5-6 with mild spinal stenosis and mild spinal cord mass effect. 2. Mild bilateral foraminal stenosis and borderline spinal stenosis at C4-5. 3. Mild-to-moderate left foraminal stenosis at C3-4 due to uncovertebral disease.  MRI brain wo contrast 06/14/2011:  Normal  MRI cervical spine wo contrast 06/14/2011:  Mild posterior central disc protrusions at C4-5 and C5-6.  No spinal or foraminal stenosis. MRI lumbar spine 06/14/2011:  L4-5 disc bulge and facet hypertrophy with moderate  biforaminal stenosis; L5-S1 disc bulge with no spinal or foraminal stenosis.  IMPRESSION: Suzanne [name] is a 41 year-old female returning for follow-up and complains of whole-body pain. Neurological exam is normal, but she has many positive fibromyalgia tenderpoints.  Her neurological work-up including MRI brain, cervical spine, and lumbar spine which does not explain reason for her falls or pain.  With her new complaints of paresthesias, I will check NCS/EMG to evaluate for peripheral nerve pathology, however my overall suspicion for a neurological etiology for her pain is very low. She is welcome to continue monthly vitamin B12 injections here as it is closer for her to travel.  PLAN/RECOMMENDATIONS:  1.  NCS/EMG left arm and leg.   If her NCS/EMG is normal, I recommend that she follow-up with her PCP or rheumatologist for fibromyalgia.   2.  Continue vitamin B12 1018mg monthly injections 3.  Smoking cessation instruction/counseling given:  counseled  patient on the dangers of tobacco use, advised patient to stop smoking, and reviewed strategies to maximize success   The duration of this appointment visit was 25 minutes of face-to-face time with the patient.  Greater than 50% of this time was spent in counseling, explanation of diagnosis, planning of further management, and coordination of care.   Thank you for allowing me to participate in patient's care.  If I can answer any additional questions, I would be pleased to do so.    Sincerely,    Mylisa Brunson K. Posey Pronto, DO

## 2016-08-12 NOTE — Telephone Encounter (Signed)
Please advise 

## 2016-08-12 NOTE — Patient Instructions (Signed)
1.  NCS/EMG of the left arm and leg 2.  Continue monthly vitamin B12 injections 3.  Stop smoking

## 2016-08-12 NOTE — Progress Notes (Signed)
08/12/2016 Suzanne [name] [id number] 09/15/1975   HISTORY OF PRESENT ILLNESS:  This is a 41 year old female who is known to Dr. Ardis Hughs.  She presents to our office today with complaints of unintentional weight loss (has lost 62 pounds at least since May 2016 when she was last seen in our office) along with intermittent diarrhea and intermittent diffuse abdominal pain.  She says the weight loss and her other symptoms have been present for about 8 months.  She says that after every time she would eat she would have to have a bowel movement. Doesn't really matter what she eats. She says that she does not have eat or drink a lot of lactose-containing products. She says that she constantly has a bubbling sensation in her abdomen. Just some diffuse tenderness generalized nonspecific abdominal pain on and off. Says that since she's been taking Phenergan that her diarrhea has been better.  She is concerned about the weight loss and says that she does not want to be this weight.  Says that she eats a lot.  CT scan abdomen and pelvis 04/2016 with no source of symptoms identified and EGD performed 11/2014 was normal.   Past Medical History:  Diagnosis Date  . Allergy   . Anemia   . Anxiety   . Arthritis   . Asthma   . Chronic lower back pain   . Depression   . GERD (gastroesophageal reflux disease)   . Hypertension   . Migraine   . Neck pain, chronic    Past Surgical History:  Procedure Laterality Date  . ESOPHAGEAL DILATION    . ESOPHAGEAL MANOMETRY N/A 04/01/2013   Procedure: ESOPHAGEAL MANOMETRY (EM);  Surgeon: Milus Banister, MD;  Location: [facility];  Service: Endoscopy;  Laterality: N/A;  . ESOPHAGOGASTRODUODENOSCOPY (EGD) WITH ESOPHAGEAL DILATION N/A 02/28/2013   Procedure: ESOPHAGOGASTRODUODENOSCOPY (EGD) WITH ESOPHAGEAL DILATION;  Surgeon: Milus Banister, MD;  Location: [facility];  Service: Endoscopy;  Laterality: N/A;  . TUBAL LIGATION    . VAGINAL HYSTERECTOMY      reports that she has been smoking Cigars.  She has a 1.00 pack-year smoking history. She has never used smokeless tobacco. She reports that she drinks alcohol. She reports that she does not use drugs. family history includes Cancer - Lung in her paternal grandmother; Diabetes in her paternal grandmother; Heart disease in her father; Hyperlipidemia in her maternal grandmother; Hypertension in her maternal grandmother, mother, and paternal grandmother. Allergies  Allergen Reactions  . Reglan [Metoclopramide] Shortness Of Breath  . Lisinopril Cough  . Losartan Cough  . Sulfa Antibiotics Nausea And Vomiting  . Amoxicillin Rash    Has patient had a PCN reaction causing immediate rash, facial/tongue/throat swelling, SOB or lightheadedness with hypotension Has patient had a PCN reaction causing severe rash involving mucus membranes or skin necrosis: Broke bottom and face out.  Has patient had a PCN reaction that required hospitalization No Has patient had a PCN reaction occurring within the last 10 years: No If all of the above answers are "NO", then may proceed with Cephalosporin use.   . Contrast Media [Iodinated Diagnostic Agents] Rash      Outpatient Encounter Prescriptions as of 08/12/2016  Medication Sig  . amLODipine (NORVASC) 10 MG tablet Take 1 tablet (10 mg total) by mouth daily.  . ferrous sulfate 325 (65 FE) MG tablet Take 1 tablet (325 mg total) by mouth daily with breakfast.  . lansoprazole (PREVACID) 30 MG capsule Take 30  minutes prior to breakfast  . Multiple Vitamin (MULTIVITAMIN WITH MINERALS) TABS tablet Take 1 tablet by mouth daily.  . promethazine (PHENERGAN) 25 MG tablet Take 1 tablet (25 mg total) by mouth every 6 (six) hours as needed for nausea or vomiting.  . valsartan-hydrochlorothiazide (DIOVAN-HCT) 320-25 MG per tablet TAKE 1 TABLET BY MOUTH DAILY.   No facility-administered encounter medications on file as of 08/12/2016.     REVIEW OF SYSTEMS  : All other systems  reviewed and negative except where noted in the History of Present Illness.   PHYSICAL EXAM: BP 138/86   Pulse 72   Ht 5' 4.25" (1.632 m)   Wt 144 lb 4 oz (65.4 kg)   BMI 24.57 kg/m  General: Well developed black female in no acute distress Head: Normocephalic and atraumatic Eyes:  Sclerae anicteric, conjunctiva pink. Ears: Normal auditory acuity Lungs: Clear throughout to auscultation Heart: Regular rate and rhythm Abdomen: Soft, non-distended.  BS present.  Non-tender. Musculoskeletal: Symmetrical with no gross deformities  Skin: No lesions on visible extremities Extremities: No edema  Neurological: Alert oriented x 4, grossly non-focal Psychological:  Alert and cooperative. Normal mood and affect  ASSESSMENT AND PLAN: 41 year old female with complaints of unintentional weight loss (has lost 62 pounds at least since May 2016 when she was last seen in our office) along with intermittent diarrhea and intermittent diffuse abdominal pain.  CT scan abdomen and pelvis 04/2016 with no source and EGD performed 11/2014 was normal.  Has normal appearing pancreas but could consider fecal elastase stool study for EPI if other evaluation proves negative.  Will check celiac labs. Will schedule colonoscopy with Dr. Ardis Hughs.  CC:  Briscoe Deutscher, DO

## 2016-08-12 NOTE — Patient Instructions (Signed)

## 2016-08-12 NOTE — Telephone Encounter (Signed)
Patient is scheduled to have colonoscopy 10-07-16 however, is concerned she needs to have it sooner. What does Dr. Juleen China suggest? Please call patient to advise. Okay to leave a detailed message on patient's phone.

## 2016-08-13 NOTE — Telephone Encounter (Signed)
Please let her know that I understand her concern and agree that it would be better to have a work-up completed more quickly. Unfortunately, it's not my call. It is likely an issue with scheduling. There may be a cancellation list that she can get on?

## 2016-08-14 LAB — STOOL CULTURE

## 2016-08-15 LAB — TISSUE TRANSGLUTAMINASE, IGA: Tissue Transglutaminase Ab, IgA: 1 U/mL (ref <4)

## 2016-08-15 NOTE — Telephone Encounter (Signed)
LM for patient to return call.

## 2016-08-15 NOTE — Telephone Encounter (Signed)
Spoke with patient and went over message. She is going to call to be put on the wait list

## 2016-08-17 ENCOUNTER — Encounter: Payer: Self-pay | Admitting: Gastroenterology

## 2016-08-17 DIAGNOSIS — R634 Abnormal weight loss: Secondary | ICD-10-CM | POA: Insufficient documentation

## 2016-08-18 NOTE — Progress Notes (Signed)
I agree with the above note, plan 

## 2016-08-31 ENCOUNTER — Ambulatory Visit (INDEPENDENT_AMBULATORY_CARE_PROVIDER_SITE_OTHER): Payer: BLUE CROSS/BLUE SHIELD | Admitting: Family Medicine

## 2016-08-31 ENCOUNTER — Ambulatory Visit: Payer: BLUE CROSS/BLUE SHIELD | Admitting: Family Medicine

## 2016-08-31 ENCOUNTER — Encounter: Payer: Self-pay | Admitting: Family Medicine

## 2016-08-31 ENCOUNTER — Ambulatory Visit (INDEPENDENT_AMBULATORY_CARE_PROVIDER_SITE_OTHER): Payer: BLUE CROSS/BLUE SHIELD

## 2016-08-31 VITALS — BP 118/72 | HR 68 | Temp 98.2°F | Ht 64.0 in | Wt 138.4 lb

## 2016-08-31 DIAGNOSIS — K529 Noninfective gastroenteritis and colitis, unspecified: Secondary | ICD-10-CM | POA: Diagnosis not present

## 2016-08-31 DIAGNOSIS — G894 Chronic pain syndrome: Secondary | ICD-10-CM

## 2016-08-31 DIAGNOSIS — M533 Sacrococcygeal disorders, not elsewhere classified: Secondary | ICD-10-CM

## 2016-08-31 DIAGNOSIS — R634 Abnormal weight loss: Secondary | ICD-10-CM

## 2016-08-31 DIAGNOSIS — G8929 Other chronic pain: Secondary | ICD-10-CM | POA: Insufficient documentation

## 2016-08-31 MED ORDER — HYDROCODONE-ACETAMINOPHEN 10-325 MG PO TABS: 1.0000 | ORAL_TABLET | Freq: Three times a day (TID) | ORAL | 0 refills | Status: DC | PRN

## 2016-08-31 NOTE — Progress Notes (Signed)
Pre visit review using our clinic review tool, if applicable. No additional management support is needed unless otherwise documented below in the visit note. 

## 2016-08-31 NOTE — Progress Notes (Signed)
Suzanne [name] is a 41 y.o. female is here to discuss:  History of Present Illness:  Water quality scientist, CMA, acting as scribe for Dr. Juleen China.  Chief Complaint  Patient presents with  . Follow-up  . Weight Loss  . Pain   CC:  Patient returns today for follow up for unintentional weight loss.  Patient states she is not feeling well today.  She has lost 7 lbs since her last visit on 08/03/2016.  States she is no longer feeling nauseous after eating because she is taking Phenergan daily.  Continues to have a bowel movement after every meal but states it is no longer as loose.  Also states she has pain "everywhere".  States she is in pain today and that the weather is making her pain worse.  States she takes her medications as prescribed and there have been no changes since last visit.  No other complaints.  HPI: As above. Interim update: Colonoscopy scheduled for May. Neurology recommends considering Fibromyalgia.  Wt Readings from Last 3 Encounters:  08/31/16 138 lb 6.4 oz (62.8 kg)  08/12/16 143 lb 1 oz (64.9 kg)  08/12/16 144 lb 4 oz (65.4 kg)   CHART REVIEW 07/02/2013, weight was 214 pounds.   Health Maintenance Due  Topic Date Due  . TETANUS/TDAP  07/29/1994   PMHx, SurgHx, SocialHx, FamHx, Medications, and Allergies were reviewed in the Visit Navigator and updated as appropriate.   Patient Active Problem List   Diagnosis Date Noted  . Chronic pain 08/31/2016  . Unintentional weight loss 08/17/2016  . B12 deficiency 08/12/2016  . Daily nausea 08/06/2016  . Microcytic anemia 08/06/2016  . Chronic diarrhea 08/06/2016  . Intestinal malabsorption 08/06/2016  . Lung nodule < 6cm on CT 08/06/2016  . Essential hypertension 07/02/2013  . Other and unspecified ovarian cyst 04/01/2013  . Dysphagia 12/22/2011    Social History  Substance Use Topics  . Smoking status: Current Some Day Smoker    Packs/day: 0.10    Years: 10.00    Types: Cigars  . Smokeless tobacco: Never Used   Comment: smokes 2 Black & Milds per day 08/19/13  . Alcohol use Yes     Comment: 1-2 times yearly    Current Medications and Allergies:   .  amLODipine (NORVASC) 10 MG tablet, Take 1 tablet (10 mg total) by mouth daily., Disp: 30 tablet, Rfl: 0 .  ferrous sulfate 325 (65 FE) MG tablet, Take 1 tablet (325 mg total) by mouth daily with breakfast., Disp: 30 tablet, Rfl: 3 .  lansoprazole (PREVACID) 30 MG capsule, Take 30 minutes prior to breakfast, Disp: 90 capsule, Rfl: 3 .  Multiple Vitamin (MULTIVITAMIN WITH MINERALS) TABS tablet, Take 1 tablet by mouth daily., Disp: , Rfl:  .  Na Sulfate-K Sulfate-Mg Sulf 17.5-3.13-1.6 GM/180ML SOLN, Take 1 kit by mouth as directed., Disp: 354 mL, Rfl: 0 .  promethazine (PHENERGAN) 25 MG tablet, Take 1 tablet (25 mg total) by mouth every 6 (six) hours as needed for nausea or vomiting., Disp: 12 tablet, Rfl: 3 .  valsartan-hydrochlorothiazide (DIOVAN-HCT) 320-25 MG per tablet, TAKE 1 TABLET BY MOUTH DAILY., Disp: 30 tablet, Rfl: 1  Allergies  Allergen Reactions  . Reglan [Metoclopramide] Shortness Of Breath  . Lisinopril Cough  . Losartan Cough  . Sulfa Antibiotics Nausea And Vomiting  . Amoxicillin Rash  . Contrast Media [Iodinated Diagnostic Agents] Rash   Review of Systems   Review of Systems  Constitutional: Negative for chills and fever.  HENT:  Negative for congestion and sore throat.   Eyes: Negative for blurred vision.  Respiratory: Negative for cough.   Cardiovascular: Negative for chest pain and palpitations.  Gastrointestinal: Positive for abdominal pain. Negative for nausea and vomiting.  Genitourinary: Negative for frequency.  Musculoskeletal: Positive for back pain, joint pain and myalgias.  Skin: Negative for rash.  Neurological: Negative for loss of consciousness and headaches.    Vitals:   Vitals:   08/31/16 0803  BP: 118/72  Pulse: 68  Temp: 98.2 F (36.8 C)  TempSrc: Oral  Weight: 138 lb 6.4 oz (62.8 kg)  Height: 5'  4" (1.626 m)     Body mass index is 23.76 kg/m.   Physical Exam:    Physical Exam  Constitutional: Vital signs are normal. She appears well-developed. She appears distressed.  HENT:  Head: Normocephalic and atraumatic.  Eyes: EOM are normal. Pupils are equal, round, and reactive to light.  Neck: Normal range of motion. Neck supple.  Cardiovascular: Normal rate, regular rhythm, normal heart sounds and intact distal pulses.   Pulmonary/Chest: Effort normal.  Abdominal: Soft. She exhibits no distension. There is no guarding. No hernia.  Musculoskeletal:       Lumbar back: She exhibits bony tenderness.  Skin: Skin is warm.  Psychiatric: She has a normal mood and affect. Her behavior is normal.  Nursing note and vitals reviewed.    Assessment and Plan:   Addylynn was seen today for follow-up, weight loss and pain.  Diagnoses and all orders for this visit:  Coccydynia Comments: Patient was barely able to lie on the table during her exam due to pain. Xray without findings. Will treat pain. Orders: -     DG Sacrum/Coccyx -     HYDROcodone-acetaminophen (NORCO) 10-325 MG tablet; Take 1 tablet by mouth every 8 (eight) hours as needed.  Chronic pain syndrome Comments: Will refer to Rheumatology. I'd like to focus on getting her into GI more quickly.   Unintentional weight loss Comments: Ongoing. The patient is distressed. She is noticably thinner today.  Chronic diarrhea Comments: The patient continues to have loose stools whenever she eats. Her nausea has improved only because she takes Phenergan every day.    . Reviewed expectations re: course of current medical issues. . Discussed self-management of symptoms. . Outlined signs and symptoms indicating need for more acute intervention. . Patient verbalized understanding and all questions were answered. . See orders for this visit as documented in the electronic medical record. . Patient received an After Visit  Summary.   Briscoe Deutscher, [place], [facility] 08/31/2016

## 2016-09-01 ENCOUNTER — Telehealth: Payer: Self-pay

## 2016-09-01 ENCOUNTER — Ambulatory Visit (INDEPENDENT_AMBULATORY_CARE_PROVIDER_SITE_OTHER): Payer: BLUE CROSS/BLUE SHIELD | Admitting: Neurology

## 2016-09-01 ENCOUNTER — Ambulatory Visit (INDEPENDENT_AMBULATORY_CARE_PROVIDER_SITE_OTHER): Payer: BLUE CROSS/BLUE SHIELD | Admitting: *Deleted

## 2016-09-01 DIAGNOSIS — E538 Deficiency of other specified B group vitamins: Secondary | ICD-10-CM | POA: Diagnosis not present

## 2016-09-01 DIAGNOSIS — R531 Weakness: Secondary | ICD-10-CM

## 2016-09-01 DIAGNOSIS — R202 Paresthesia of skin: Secondary | ICD-10-CM

## 2016-09-01 MED ORDER — CYANOCOBALAMIN 1000 MCG/ML IJ SOLN
1000.0000 ug | Freq: Once | INTRAMUSCULAR | Status: AC
  Administered 2016-09-01: 1000 ug via INTRAMUSCULAR

## 2016-09-01 NOTE — Procedures (Signed)
[facility]  [address] [place], [address]  [place],  [address] Tel: [phone] Fax:  [phone] Test Date:  09/01/2016  Patient: Suzanne [name] DOB: 1976-05-06 Physician: Narda Amber, DO  Sex: Female Height: 5\' 4"  Ref Phys: Narda Amber, DO  ID#: [id number] Temp: 34.6C Technician:    Patient Complaints: This is a 41 year-old referred for evaluation of whole body pain and paresthesias.  NCV & EMG Findings: Extensive electrodiagnostic testing of the left upper and lower extremity shows:  1. All sensory responses including left median, ulnar, mixed palmer, sural, and superficial nerves are within normal limits. 2. All motor responses including the left median, ulnar, peroneal, and tibial nerves are within normal limits. 3. Right tibial H reflex study is within normal limits. 4. There is no evidence of active or chronic motor axon loss changes affecting any of the tested muscles. Motor unit configuration and recruitment pattern is within normal limits.  Impression: This is a normal study of the left side.   In particular, there is no evidence of a generalized sensorimotor polyneuropathy, diffuse myopathy, cervical/lumbosacral radiculopathy, or carpal tunnel syndrome affecting the left side.    ___________________________ Narda Amber, DO    Nerve Conduction Studies Anti Sensory Summary Table   Stim Site NR Peak (ms) Norm Peak (ms) P-T Amp (V) Norm P-T Amp  Left Median Anti Sensory (2nd Digit)  Wrist    2.6 <3.4 40.2 >20  Left Sup Peroneal Anti Sensory (Ant Lat Mall)  12 cm    2.8 <4.5 11.7 >5  Left Sural Anti Sensory (Lat Mall)  Calf    3.5 <4.5 17.6 >5  Left Ulnar Anti Sensory (5th Digit)  Wrist    2.6 <3.1 35.1 >12   Motor Summary Table   Stim Site NR Onset (ms) Norm Onset (ms) O-P Amp (mV) Norm O-P Amp Site1 Site2 Delta-0 (ms) Dist (cm) Vel (m/s) Norm Vel (m/s)  Left Median Motor (Abd Poll Brev)  Wrist    2.3 <3.9 10.3 >6 Elbow Wrist 4.9 30.0 61 >50    Elbow    7.2  10.1         Left Peroneal Motor (Ext Dig Brev)  Ankle    3.3 <5.5 6.9 >3 B Fib Ankle 6.6 36.0 55 >40  B Fib    9.9  6.6  Poplt B Fib 1.6 8.0 50 >40  Poplt    11.5  6.0         Left Tibial Motor (Abd Hall Brev)  Ankle    3.4 <6.0 9.1 >8 Knee Ankle 7.7 39.0 51 >40  Knee    11.1  6.3         Left Ulnar Motor (Abd Dig Minimi)  Wrist    2.1 <3.1 11.7 >7 B Elbow Wrist 3.6 24.0 67 >50  B Elbow    5.7  10.5  A Elbow B Elbow 1.6 10.0 63 >50  A Elbow    7.3  10.1          Comparison Summary Table   Stim Site NR Peak (ms) Norm Peak (ms) P-T Amp (V) Site1 Site2 Delta-P (ms) Norm Delta (ms)  Left Median/Ulnar Palm Comparison (Wrist - 8cm)  Median Palm    1.6 <2.2 33.5 Median Palm Ulnar Palm 0.3   Ulnar Palm    1.3 <2.2 17.6       H Reflex Studies   NR H-Lat (ms) Lat Norm (ms) L-R H-Lat (ms)  Left Tibial (Gastroc)  29.80 <35    EMG   Side Muscle Ins Act Fibs Psw Fasc Number Recrt Dur Dur. Amp Amp. Poly Poly. Comment  Left AntTibialis Nml Nml Nml Nml Nml Nml Nml Nml Nml Nml Nml Nml N/A  Left Gastroc Nml Nml Nml Nml Nml Nml Nml Nml Nml Nml Nml Nml N/A  Left RectFemoris Nml Nml Nml Nml Nml Nml Nml Nml Nml Nml Nml Nml N/A  Left GluteusMed Nml Nml Nml Nml Nml Nml Nml Nml Nml Nml Nml Nml N/A  Left BicepsFemS Nml Nml Nml Nml Nml Nml Nml Nml Nml Nml Nml Nml N/A  Left 1stDorInt Nml Nml Nml Nml Nml Nml Nml Nml Nml Nml Nml Nml N/A  Left Ext Indicis Nml Nml Nml Nml Nml Nml Nml Nml Nml Nml Nml Nml N/A  Left PronatorTeres Nml Nml Nml Nml Nml Nml Nml Nml Nml Nml Nml Nml N/A  Left Biceps Nml Nml Nml Nml Nml Nml Nml Nml Nml Nml Nml Nml N/A  Left Triceps Nml Nml Nml Nml Nml Nml Nml Nml Nml Nml Nml Nml N/A  Left Deltoid Nml Nml Nml Nml Nml Nml Nml Nml Nml Nml Nml Nml N/A      Waveforms:

## 2016-09-01 NOTE — Telephone Encounter (Signed)
Called patient and left her a voicemail telling her that Dr. Juleen China received a message from Dr. Ardis Hughs office and they are going to try to get her in sooner for colonoscopy.  Advised patient that if she doesn't hear from them by the end of the week, to contact us on Monday to let us know.

## 2016-09-02 ENCOUNTER — Telehealth: Payer: Self-pay

## 2016-09-02 NOTE — Telephone Encounter (Signed)
-----   Message from Loralie Champagne, PA-C sent at 09/01/2016 10:51 AM EDT ----- I doubt that Suzanne [name] has anything sooner, but could you please check for cancellations?  Hers is only a few weeks away at this point.  If not, then maybe you can check to see if someone else has something sooner and I can discuss with them.  Thank you,  Jess  ----- Message ----- From: Briscoe Deutscher, DO Sent: 08/31/2016   8:17 PM To: Loralie Champagne, PA-C  Thank you both for caring for our mutual patient. I saw her today for follow-up. She continues to lose weight, though she is eating adequate calories. She has daily nausea and and loose stools. I am concerned that her colonoscopy is too far out. If there is any way to move it forward, I would greatly appreciate the help. I try to never ask this of our specialists - just worried that we are prolonging a work-up that has to move forward. Thanks and please let me know if I can assist in any way. Danae Chen

## 2016-09-02 NOTE — Telephone Encounter (Signed)
The pt has been put on the wait list.

## 2016-09-06 ENCOUNTER — Telehealth: Payer: Self-pay | Admitting: Family Medicine

## 2016-09-06 NOTE — Telephone Encounter (Signed)
Patient calling regarding sooner colonoscopy appointment.  Advised that Dr. Juleen China reached out to GI and they responded that they do not have anything earlier than 09/20/16.  They have placed patient on their wait list and will get her in sooner if something becomes available.  Advised patient to keep her appointment for 09/20/16.  Patient verbalized understanding.

## 2016-09-09 NOTE — Telephone Encounter (Signed)
There is an available appt for 09/14/16 with Dr Ardis Hughs, I have tried to reach the pt but have not been able to reach her.  No voice mail to leave a message.  It hangs up after the message.

## 2016-09-13 ENCOUNTER — Telehealth: Payer: Self-pay | Admitting: Emergency Medicine

## 2016-09-13 NOTE — Telephone Encounter (Signed)
Called to follow up on appointment made on MyChart. Called to check on patient an see if this appointment needed to be made sooner. Left voicemail for pt to return call to office.

## 2016-09-14 ENCOUNTER — Telehealth: Payer: Self-pay | Admitting: Family Medicine

## 2016-09-14 ENCOUNTER — Encounter: Payer: Self-pay | Admitting: Gastroenterology

## 2016-09-15 ENCOUNTER — Ambulatory Visit (INDEPENDENT_AMBULATORY_CARE_PROVIDER_SITE_OTHER): Payer: BLUE CROSS/BLUE SHIELD | Admitting: Family Medicine

## 2016-09-15 ENCOUNTER — Ambulatory Visit
Admission: RE | Admit: 2016-09-15 | Discharge: 2016-09-15 | Disposition: A | Discharge status: Home / Self Care | Payer: BLUE CROSS/BLUE SHIELD | Source: Ambulatory Visit | Attending: Family Medicine | Admitting: Family Medicine

## 2016-09-15 ENCOUNTER — Encounter: Payer: Self-pay | Admitting: Family Medicine

## 2016-09-15 VITALS — BP 124/78 | HR 72 | Temp 98.3°F | Ht 64.25 in | Wt 140.6 lb

## 2016-09-15 DIAGNOSIS — G8929 Other chronic pain: Secondary | ICD-10-CM

## 2016-09-15 DIAGNOSIS — R634 Abnormal weight loss: Secondary | ICD-10-CM

## 2016-09-15 DIAGNOSIS — Z1239 Encounter for other screening for malignant neoplasm of breast: Secondary | ICD-10-CM

## 2016-09-15 DIAGNOSIS — Z1231 Encounter for screening mammogram for malignant neoplasm of breast: Secondary | ICD-10-CM | POA: Diagnosis not present

## 2016-09-15 DIAGNOSIS — M25512 Pain in left shoulder: Secondary | ICD-10-CM

## 2016-09-15 MED ORDER — PREGABALIN 25 MG PO CAPS: 25.0000 mg | ORAL_CAPSULE | Freq: Two times a day (BID) | ORAL | 0 refills | Status: DC

## 2016-09-15 NOTE — Progress Notes (Signed)
Pre visit review using our clinic review tool, if applicable. No additional management support is needed unless otherwise documented below in the visit note. 

## 2016-09-15 NOTE — Progress Notes (Signed)
Suzanne [name] is a 41 y.o. female here for a recurrence of a previously resolved problem.  History of Present Illness:   Water quality scientist, CMA, acting as scribe for Dr. Juleen China.   Chief Complaint  Patient presents with  . Acute Visit  . Neck Pain    Neck Pain   This is a recurrent problem. The current episode started more than 1 month ago. The problem occurs constantly. The problem has been gradually worsening. The pain is associated with nothing. The pain is present in the left side. The pain is at a severity of 8/10. The pain is moderate. Nothing aggravates the symptoms. The pain is worse during the night. Associated symptoms include numbness, tingling and weight loss. Pertinent negatives include no chest pain or fever. She has tried heat and ice for the symptoms. The treatment provided no relief.   PMHx, SurgHx, SocialHx, Medications, and Allergies were reviewed in the Visit Navigator and updated as appropriate.  Current Medications:   .  amLODipine (NORVASC) 10 MG tablet, Take 1 tablet (10 mg total) by mouth daily., Disp: 30 tablet, Rfl: 0 .  ferrous sulfate 325 (65 FE) MG tablet, Take 1 tablet (325 mg total) by mouth daily with breakfast., Disp: 30 tablet, Rfl: 3 .  HYDROcodone-acetaminophen (NORCO) 10-325 MG tablet, Take 1 tablet by mouth every 8 (eight) hours as needed., Disp: 30 tablet, Rfl: 0 .  lansoprazole (PREVACID) 30 MG capsule, Take 30 minutes prior to breakfast, Disp: 90 capsule, Rfl: 3 .  Multiple Vitamin (MULTIVITAMIN WITH MINERALS) TABS tablet, Take 1 tablet by mouth daily., Disp: , Rfl:  .  Na Sulfate-K Sulfate-Mg Sulf 17.5-3.13-1.6 GM/180ML SOLN, Take 1 kit by mouth as directed., Disp: 354 mL, Rfl: 0 .  promethazine (PHENERGAN) 25 MG tablet, Take 1 tablet (25 mg total) by mouth every 6 (six) hours as needed for nausea or vomiting., Disp: 12 tablet, Rfl: 3 .  valsartan-hydrochlorothiazide (DIOVAN-HCT) 320-25 MG per tablet, TAKE 1 TABLET BY MOUTH DAILY., Disp: 30 tablet,  Rfl: 1  Review of Systems:   Review of Systems  Constitutional: Positive for weight loss. Negative for chills and fever.  HENT: Negative for congestion and sore throat.   Eyes: Negative for blurred vision.  Respiratory: Negative for cough and shortness of breath.   Cardiovascular: Negative for chest pain and palpitations.  Gastrointestinal: Negative for abdominal pain, nausea and vomiting.  Genitourinary: Negative for frequency.  Musculoskeletal: Positive for neck pain.  Skin: Negative for rash.  Neurological: Positive for tingling, sensory change, focal weakness and numbness.    Vitals:   Vitals:   09/15/16 1032  BP: 124/78  Pulse: 72  Temp: 98.3 F (36.8 C)  TempSrc: Oral  SpO2: 100%  Weight: 140 lb 9.6 oz (63.8 kg)  Height: 5' 4.25" (1.632 m)     Body mass index is 23.95 kg/m.  Physical Exam:   Physical Exam  Constitutional: She appears well-developed. No distress.  HENT:  Head: Normocephalic and atraumatic.  Eyes: EOM are normal. Pupils are equal, round, and reactive to light.  Neck: Normal range of motion. Neck supple.  Cardiovascular: Normal rate, regular rhythm, normal heart sounds and intact distal pulses.   Pulmonary/Chest: Effort normal.  Abdominal: Soft.  Musculoskeletal:       Left shoulder: She exhibits tenderness, pain and spasm. She exhibits normal range of motion, no crepitus, normal pulse and normal strength.  Skin: Skin is warm.  Psychiatric: She has a normal mood and affect. Her behavior is normal.  Nursing note and vitals reviewed.   Assessment and Plan:    Elliot was seen today for acute visit and neck pain.  Diagnoses and all orders for this visit:  Chronic pain in left shoulder Comments: Follwed by Neurology, Pain Clinic in the past. Chronic pain issues. Negative EMG. Pain improvement with Lyrica in the past. Will restart.  Orders: -     pregabalin (LYRICA) 25 MG capsule; Take 1 capsule (25 mg total) by mouth 2 (two) times  daily. -     Ambulatory referral to Physical Therapy  Unintentional weight loss Comments: Weight stable since last visit. Colonoscopy next week.    . Reviewed expectations re: course of current medical issues. . Discussed self-management of symptoms. . Outlined signs and symptoms indicating need for more acute intervention. . Patient verbalized understanding and all questions were answered. . See orders for this visit as documented in the electronic medical record. . Patient received an After-Visit Summary.  CMA served as Education administrator during this visit. History, Physical, and Plan performed by medical provider. Documentation and orders reviewed and attested to. Briscoe Deutscher, D.O.  Briscoe Deutscher, D.O. [place], [facility]

## 2016-09-16 ENCOUNTER — Ambulatory Visit: Payer: BLUE CROSS/BLUE SHIELD | Admitting: Family Medicine

## 2016-09-20 ENCOUNTER — Encounter: Payer: Self-pay | Admitting: Gastroenterology

## 2016-09-20 ENCOUNTER — Ambulatory Visit (AMBULATORY_SURGERY_CENTER): Payer: BLUE CROSS/BLUE SHIELD | Admitting: Gastroenterology

## 2016-09-20 VITALS — BP 172/84 | HR 65 | Temp 98.4°F | Resp 22 | Ht 64.0 in | Wt 144.0 lb

## 2016-09-20 DIAGNOSIS — R634 Abnormal weight loss: Secondary | ICD-10-CM

## 2016-09-20 DIAGNOSIS — K635 Polyp of colon: Secondary | ICD-10-CM

## 2016-09-20 DIAGNOSIS — D125 Benign neoplasm of sigmoid colon: Secondary | ICD-10-CM

## 2016-09-20 DIAGNOSIS — R197 Diarrhea, unspecified: Secondary | ICD-10-CM

## 2016-09-20 MED ORDER — SODIUM CHLORIDE 0.9 % IV SOLN: 500.0000 mL | INTRAVENOUS | Status: DC

## 2016-09-20 NOTE — Progress Notes (Signed)
Pt with elevated bp, states they have been good recently. Encouraged her to follow up with her primary care provider. Denies any headache or visual changes, denies any complaints.

## 2016-09-20 NOTE — Patient Instructions (Signed)
Impression/recommendations:  Pol[place] (handout given)  YOU HAD AN ENDOSCOPIC PROCEDURE TODAY AT THE [facility]:   Refer to the procedure report that was given to you for any specific questions about what was found during the examination.  If the procedure report does not answer your questions, please call your gastroenterologist to clarify.  If you requested that your care partner not be given the details of your procedure findings, then the procedure report has been included in a sealed envelope for you to review at your convenience later.  YOU SHOULD EXPECT: Some feelings of bloating in the abdomen. Passage of more gas than usual.  Walking can help get rid of the air that was put into your GI tract during the procedure and reduce the bloating. If you had a lower endoscopy (such as a colonoscopy or flexible sigmoidoscopy) you may notice spotting of blood in your stool or on the toilet paper. If you underwent a bowel prep for your procedure, you may not have a normal bowel movement for a few days.  Please Note:  You might notice some irritation and congestion in your nose or some drainage.  This is from the oxygen used during your procedure.  There is no need for concern and it should clear up in a day or so.  SYMPTOMS TO REPORT IMMEDIATELY:   Following lower endoscopy (colonoscopy or flexible sigmoidoscopy):  Excessive amounts of blood in the stool  Significant tenderness or worsening of abdominal pains  Swelling of the abdomen that is new, acute  Fever of 100F or higher  For urgent or emergent issues, a gastroenterologist can be reached at any hour by calling [phone].   DIET:  We do recommend a small meal at first, but then you may proceed to your regular diet.  Drink plenty of fluids but you should avoid alcoholic beverages for 24 hours.  ACTIVITY:  You should plan to take it easy for the rest of today and you should NOT DRIVE or use heavy machinery until tomorrow  (because of the sedation medicines used during the test).    FOLLOW UP: Our staff will call the number listed on your records the next business day following your procedure to check on you and address any questions or concerns that you may have regarding the information given to you following your procedure. If we do not reach you, we will leave a message.  However, if you are feeling well and you are not experiencing any problems, there is no need to return our call.  We will assume that you have returned to your regular daily activities without incident.  If any biopsies were taken you will be contacted by phone or by letter within the next 1-3 weeks.  Please call us at [phone] if you have not heard about the biopsies in 3 weeks.    SIGNATURES/CONFIDENTIALITY: You and/or your care partner have signed paperwork which will be entered into your electronic medical record.  These signatures attest to the fact that that the information above on your After Visit Summary has been reviewed and is understood.  Full responsibility of the confidentiality of this discharge information lies with you and/or your care-partner. 

## 2016-09-20 NOTE — Progress Notes (Signed)
Report to PACU, RN, vss, BBS= Clear.  

## 2016-09-20 NOTE — Op Note (Signed)
[facility] Patient Name: Suzanne [name] Procedure Date: 09/20/2016 10:28 AM MRN: [id number] Endoscopist: Milus Banister , MD Age: 41 Referring MD:  Date of Birth: August 19, 1975 Gender: Female Account #: [id number] Procedure:                Colonoscopy Indications:              Chronic diarrhea, Weight loss Medicines:                Monitored Anesthesia Care Procedure:                Pre-Anesthesia Assessment:                           - Prior to the procedure, a History and Physical                            was performed, and patient medications and                            allergies were reviewed. The patient's tolerance of                            previous anesthesia was also reviewed. The risks                            and benefits of the procedure and the sedation                            options and risks were discussed with the patient.                            All questions were answered, and informed consent                            was obtained. Prior Anticoagulants: The patient has                            taken no previous anticoagulant or antiplatelet                            agents. ASA Grade Assessment: II - A patient with                            mild systemic disease. After reviewing the risks                            and benefits, the patient was deemed in                            satisfactory condition to undergo the procedure.                           After obtaining informed consent, the colonoscope  was passed under direct vision. Throughout the                            procedure, the patient's blood pressure, pulse, and                            oxygen saturations were monitored continuously. The                            Model CF-HQ190L [id number]) scope was introduced                            through the anus and advanced to the the terminal                            ileum. The colonoscopy was  performed without                            difficulty. The patient tolerated the procedure                            well. The quality of the bowel preparation was                            good. The terminal ileum, ileocecal valve,                            appendiceal orifice, and rectum were photographed. Scope In: 10:41:42 AM Scope Out: 10:51:51 AM Scope Withdrawal Time: 0 hours 6 minutes 40 seconds  Total Procedure Duration: 0 hours 10 minutes 9 seconds  Findings:                 The terminal ileum appeared normal.                           A 4 mm polyp was found in the sigmoid colon. The                            polyp was sessile. The polyp was removed with a                            cold snare. Resection and retrieval were complete.                           The exam was otherwise without abnormality on                            direct and retroflexion views.                           Biopsies for histology were taken with a cold                            forceps from the entire colon for evaluation  of                            microscopic colitis. Complications:            No immediate complications. Estimated blood loss:                            None. Estimated Blood Loss:     Estimated blood loss: none. Impression:               - The examined portion of the ileum was normal.                           - One 4 mm polyp in the sigmoid colon, removed with                            a cold snare. Resected and retrieved.                           - The examination was otherwise normal on direct                            and retroflexion views.                           - Biopsies were taken with a cold forceps from the                            entire colon for evaluation of microscopic colitis. Recommendation:           - Patient has a contact number available for                            emergencies. The signs and symptoms of potential                             delayed complications were discussed with the                            patient. Return to normal activities tomorrow.                            Written discharge instructions were provided to the                            patient.                           - Resume previous diet.                           - Continue present medications.                           - Awiat final pathology; if results are not helpful  she will likely need further testing for her                            signficant weight loss, loose stools. Milus Banister, MD 09/20/2016 11:01:06 AM This report has been signed electronically.

## 2016-09-20 NOTE — Progress Notes (Signed)
Called to room to assist during endoscopic procedure.  Patient ID and intended procedure confirmed with present staff. Received instructions for my participation in the procedure from the performing physician.  

## 2016-09-21 ENCOUNTER — Telehealth: Payer: Self-pay | Admitting: *Deleted

## 2016-09-21 NOTE — Telephone Encounter (Signed)
  Follow up Call-  Call back number 09/20/2016 12/03/2014  Post procedure Call Back phone  # [phone]  Permission to leave phone message Yes Yes  Some recent data might be hidden     Patient questions:  Do you have a fever, pain , or abdominal swelling? No. Pain Score  0 *  Have you tolerated food without any problems? Yes.    Have you been able to return to your normal activities? Yes.    Do you have any questions about your discharge instructions: Diet   No. Medications  No. Follow up visit  No.  Do you have questions or concerns about your Care? No.  Actions: * If pain score is 4 or above: No action needed, pain <4.

## 2016-09-26 ENCOUNTER — Other Ambulatory Visit: Payer: Self-pay

## 2016-09-26 DIAGNOSIS — R195 Other fecal abnormalities: Secondary | ICD-10-CM

## 2016-09-26 DIAGNOSIS — R634 Abnormal weight loss: Secondary | ICD-10-CM

## 2016-09-28 ENCOUNTER — Telehealth: Payer: Self-pay | Admitting: Family Medicine

## 2016-09-28 NOTE — Telephone Encounter (Signed)
Patient Name: Suzanne [name]  DOB: 1976/01/09    Initial Comment Caller was stung by a bee yesterday. Feels like she has the flu.    Nurse Assessment  Nurse: Jimmey Ralph, RN, Lissa Date/Time (Eastern Time): 09/28/2016 4:12:56 PM  Confirm and document reason for call. If symptomatic, describe symptoms. ---Caller was stung by a Wasp yesterday on my finger. Feels like she has the flu. Fever 98.0. The pinky on right hand and the swelling went down after I put ice on it and took motrin.  Does the patient have any new or worsening symptoms? ---Yes  Will a triage be completed? ---Yes  Related visit to physician within the last 2 weeks? ---Yes  Does the PT have any chronic conditions? (i.e. diabetes, asthma, etc.) ---Yes  List chronic conditions. ---asthma  Is the patient pregnant or possibly pregnant? (Ask all females between the ages of 57-55) ---No  Is this a behavioral health or substance abuse call? ---No     Guidelines    Guideline Title Affirmed Question Affirmed Notes  Bee or Yellow Jacket Sting Normal local reaction to bee, wasp, or yellow jacket sting   Common Cold [1] SEVERE sore throat AND [2] present > 24 hours    Final Disposition User   See Physician within 24 Hours Hamm[place], RN, Lissa    Comments  Caller will be contacting office in am to schedule an appointment to be seen.   Disagree/Comply: Comply    Disagree/Comply: Comply

## 2016-09-28 NOTE — Telephone Encounter (Signed)
Patient called and said she was stung by a bee yesterday on her pinky finger. Patient said she was up and down all night with sweats and also woke up with a fever. Patient said when she takes a deep breath it's tight on her LFT side. Transferred to team health.

## 2016-09-29 ENCOUNTER — Encounter: Payer: Self-pay | Admitting: Family Medicine

## 2016-09-29 ENCOUNTER — Other Ambulatory Visit: Payer: BLUE CROSS/BLUE SHIELD

## 2016-09-29 ENCOUNTER — Encounter: Payer: Self-pay | Admitting: Surgical

## 2016-09-29 ENCOUNTER — Ambulatory Visit (INDEPENDENT_AMBULATORY_CARE_PROVIDER_SITE_OTHER): Payer: BLUE CROSS/BLUE SHIELD | Admitting: Family Medicine

## 2016-09-29 ENCOUNTER — Ambulatory Visit (INDEPENDENT_AMBULATORY_CARE_PROVIDER_SITE_OTHER): Payer: BLUE CROSS/BLUE SHIELD | Admitting: *Deleted

## 2016-09-29 VITALS — BP 136/94 | HR 73 | Temp 98.4°F | Wt 138.2 lb

## 2016-09-29 DIAGNOSIS — R195 Other fecal abnormalities: Secondary | ICD-10-CM

## 2016-09-29 DIAGNOSIS — R634 Abnormal weight loss: Secondary | ICD-10-CM | POA: Diagnosis not present

## 2016-09-29 DIAGNOSIS — J0100 Acute maxillary sinusitis, unspecified: Secondary | ICD-10-CM | POA: Diagnosis not present

## 2016-09-29 DIAGNOSIS — E538 Deficiency of other specified B group vitamins: Secondary | ICD-10-CM | POA: Diagnosis not present

## 2016-09-29 MED ORDER — CETIRIZINE HCL 10 MG PO TABS
10.0000 mg | ORAL_TABLET | Freq: Every day | ORAL | 11 refills | Status: DC
Start: 2016-09-29 — End: 2017-12-27

## 2016-09-29 MED ORDER — CYANOCOBALAMIN 1000 MCG/ML IJ SOLN
1000.0000 ug | Freq: Once | INTRAMUSCULAR | Status: AC
  Administered 2016-09-29: 1000 ug via INTRAMUSCULAR

## 2016-09-29 MED ORDER — PREDNISONE 5 MG PO TABS: ORAL_TABLET | ORAL | 0 refills | Status: DC

## 2016-09-29 NOTE — Telephone Encounter (Signed)
Patient coming in today at 10:30.

## 2016-09-29 NOTE — Progress Notes (Signed)
Suzanne [name] is a 41 y.o. female here for an acute visit.  History of Present Illness:   [name] CMA acting as scribe for Dr. Juleen China.  CC: patient comes in today due to being stung by a wasp Monday on left pinky finger. Sting area is doing better today.  She stated that the next day she started having cough, congestion, ear pain, sore throat, and runny nose.   Cough  This is a new problem. The current episode started in the past 7 days. The problem has been unchanged. The problem occurs constantly. The cough is productive of sputum. Associated symptoms include chills, ear pain, headaches and a sore throat. Pertinent negatives include no chest pain, fever, shortness of breath or wheezing. Nothing aggravates the symptoms. She has tried nothing for the symptoms. Her past medical history is significant for asthma.   PMHx, SurgHx, SocialHx, Medications, and Allergies were reviewed in the Visit Navigator and updated as appropriate.  Current Medications:   .  amLODipine (NORVASC) 10 MG tablet, Take 1 tablet (10 mg total) by mouth daily., Disp: 30 tablet, Rfl: 0 .  ferrous sulfate 325 (65 FE) MG tablet, Take 1 tablet (325 mg total) by mouth daily with breakfast., Disp: 30 tablet, Rfl: 3 .  HYDROcodone-acetaminophen (NORCO) 10-325 MG tablet, Take 1 tablet by mouth every 8 (eight) hours as needed., Disp: 30 tablet, Rfl: 0 .  lansoprazole (PREVACID) 30 MG capsule, Take 30 minutes prior to breakfast, Disp: 90 capsule, Rfl: 3 .  Multiple Vitamin (MULTIVITAMIN WITH MINERALS) TABS tablet, Take 1 tablet by mouth daily., Disp: , Rfl:  .  pregabalin (LYRICA) 25 MG capsule, Take 1 capsule (25 mg total) by mouth 2 (two) times daily., Disp: 60 capsule, Rfl: 0 .  promethazine (PHENERGAN) 25 MG tablet, Take 1 tablet (25 mg total) by mouth every 6 (six) hours as needed for nausea or vomiting., Disp: 12 tablet, Rfl: 3 .  valsartan-hydrochlorothiazide (DIOVAN-HCT) 320-25 MG per tablet, TAKE 1 TABLET BY  MOUTH DAILY., Disp: 30 tablet, Rfl: 1  Review of Systems:   Review of Systems  Constitutional: Positive for chills and malaise/fatigue. Negative for fever.  HENT: Positive for congestion, ear pain, sinus pain and sore throat.   Eyes: Negative for blurred vision and double vision.  Respiratory: Positive for cough. Negative for shortness of breath and wheezing.   Cardiovascular: Negative for chest pain, palpitations and leg swelling.  Gastrointestinal: Negative for abdominal pain, constipation, diarrhea and vomiting.  Genitourinary: Negative for dysuria.  Musculoskeletal: Positive for neck pain. Negative for back pain and joint pain.  Neurological: Positive for headaches. Negative for dizziness.  Psychiatric/Behavioral: Negative for depression, hallucinations and memory loss.   Vitals:   Vitals:   09/29/16 1035  BP: (!) 136/94  Pulse: 73  Temp: 98.4 F (36.9 C)  TempSrc: Oral  SpO2: 98%  Weight: 138 lb 3.2 oz (62.7 kg)     Body mass index is 23.72 kg/m.  Physical Exam:   Physical Exam  Constitutional: She appears well-nourished.  HENT:  Head: Normocephalic and atraumatic.  Nose: Rhinorrhea present. Right sinus exhibits maxillary sinus tenderness. Left sinus exhibits maxillary sinus tenderness.  Eyes: EOM are normal. Pupils are equal, round, and reactive to light.  Neck: Normal range of motion. Neck supple.  Cardiovascular: Normal rate, regular rhythm, normal heart sounds and intact distal pulses.   Pulmonary/Chest: Effort normal.  Abdominal: Soft.  Skin: Skin is warm.  Psychiatric: She has a normal mood and affect. Her behavior  is normal.  Nursing note and vitals reviewed.  Assessment and Plan:    [name] was seen today for insect bite and cough.  Diagnoses and all orders for this visit:  Subacute maxillary sinusitis -     predniSONE (DELTASONE) 5 MG tablet; 6,6,5,5,4,4,3,3,2,2,1,1,done -     cetirizine (ZYRTEC) 10 MG tablet; Take 1 tablet (10 mg total) by mouth  daily.   . Reviewed expectations re: course of current medical issues. . Discussed self-management of symptoms. . Outlined signs and symptoms indicating need for more acute intervention. . Patient verbalized understanding and all questions were answered. . See orders for this visit as documented in the electronic medical record. . Patient received an After-Visit Summary.  CMA served as Education administrator during this visit. History, Physical, and Plan performed by medical provider. Documentation and orders reviewed and attested to. Briscoe Deutscher, D.O.  Briscoe Deutscher, D.O. [place], [facility]  Future Appointments Date Time Provider [place]  09/29/2016 2:00 PM [place], LPN [id number] None

## 2016-09-30 LAB — ANA: Anti Nuclear Antibody(ANA): NEGATIVE

## 2016-10-07 ENCOUNTER — Encounter: Payer: BLUE CROSS/BLUE SHIELD | Admitting: Gastroenterology

## 2016-10-31 ENCOUNTER — Ambulatory Visit (INDEPENDENT_AMBULATORY_CARE_PROVIDER_SITE_OTHER): Payer: BLUE CROSS/BLUE SHIELD | Admitting: *Deleted

## 2016-10-31 DIAGNOSIS — E538 Deficiency of other specified B group vitamins: Secondary | ICD-10-CM | POA: Diagnosis not present

## 2016-10-31 MED ORDER — CYANOCOBALAMIN 1000 MCG/ML IJ SOLN
1000.0000 ug | Freq: Once | INTRAMUSCULAR | Status: AC
  Administered 2016-10-31: 1000 ug via INTRAMUSCULAR

## 2016-11-24 ENCOUNTER — Telehealth: Payer: Self-pay | Admitting: Family Medicine

## 2016-11-24 NOTE — Telephone Encounter (Signed)
No

## 2016-11-24 NOTE — Telephone Encounter (Signed)
Patient never called back to physical therapy to schedule. It was ordered in April. Does patient still need referral?

## 2016-11-25 ENCOUNTER — Ambulatory Visit (INDEPENDENT_AMBULATORY_CARE_PROVIDER_SITE_OTHER): Payer: BLUE CROSS/BLUE SHIELD | Admitting: Family Medicine

## 2016-11-25 ENCOUNTER — Encounter: Payer: Self-pay | Admitting: Family Medicine

## 2016-11-25 VITALS — BP 124/72 | HR 74 | Temp 98.3°F | Ht 64.5 in | Wt 136.6 lb

## 2016-11-25 DIAGNOSIS — K529 Noninfective gastroenteritis and colitis, unspecified: Secondary | ICD-10-CM

## 2016-11-25 DIAGNOSIS — R110 Nausea: Secondary | ICD-10-CM | POA: Diagnosis not present

## 2016-11-25 DIAGNOSIS — Z90710 Acquired absence of both cervix and uterus: Secondary | ICD-10-CM | POA: Insufficient documentation

## 2016-11-25 DIAGNOSIS — R634 Abnormal weight loss: Secondary | ICD-10-CM

## 2016-11-25 DIAGNOSIS — K909 Intestinal malabsorption, unspecified: Secondary | ICD-10-CM | POA: Diagnosis not present

## 2016-11-25 LAB — CBC WITH DIFFERENTIAL/PLATELET
Basophils Absolute: 0 10*3/uL (ref 0.0–0.1)
Basophils Relative: 0.4 % (ref 0.0–3.0)
Eosinophils Absolute: 0.2 10*3/uL (ref 0.0–0.7)
Eosinophils Relative: 5.1 % — ABNORMAL HIGH (ref 0.0–5.0)
HCT: 35.3 % — ABNORMAL LOW (ref 36.0–46.0)
Hemoglobin: 11.4 g/dL — ABNORMAL LOW (ref 12.0–15.0)
Lymphocytes Relative: 42.4 % (ref 12.0–46.0)
Lymphs Abs: 2.1 10*3/uL (ref 0.7–4.0)
MCHC: 32.4 g/dL (ref 30.0–36.0)
MCV: 73.5 fl — ABNORMAL LOW (ref 78.0–100.0)
Monocytes Absolute: 0.4 10*3/uL (ref 0.1–1.0)
Monocytes Relative: 8.5 % (ref 3.0–12.0)
Neutro Abs: 2.1 10*3/uL (ref 1.4–7.7)
Neutrophils Relative %: 43.6 % (ref 43.0–77.0)
Platelets: 201 10*3/uL (ref 150.0–400.0)
RBC: 4.8 Mil/uL (ref 3.87–5.11)
RDW: 15.6 % — ABNORMAL HIGH (ref 11.5–15.5)
WBC: 4.9 10*3/uL (ref 4.0–10.5)

## 2016-11-25 LAB — COMPREHENSIVE METABOLIC PANEL
ALT: 12 U/L (ref 0–35)
AST: 16 U/L (ref 0–37)
Albumin: 4 g/dL (ref 3.5–5.2)
Alkaline Phosphatase: 53 U/L (ref 39–117)
BUN: 15 mg/dL (ref 6–23)
CO2: 34 mEq/L — ABNORMAL HIGH (ref 19–32)
Calcium: 9.3 mg/dL (ref 8.4–10.5)
Chloride: 105 mEq/L (ref 96–112)
Creatinine, Ser: 0.78 mg/dL (ref 0.40–1.20)
GFR: 104.5 mL/min (ref >60.00)
Glucose, Bld: 93 mg/dL (ref 70–99)
Potassium: 3.6 mEq/L (ref 3.5–5.1)
Sodium: 142 mEq/L (ref 135–145)
Total Bilirubin: 0.4 mg/dL (ref 0.2–1.2)
Total Protein: 7.1 g/dL (ref 6.0–8.3)

## 2016-11-25 LAB — H. PYLORI ANTIBODY, IGG: H Pylori IgG: NEGATIVE

## 2016-11-25 MED ORDER — DICYCLOMINE HCL 10 MG PO CAPS: 10.0000 mg | ORAL_CAPSULE | Freq: Three times a day (TID) | ORAL | 0 refills | Status: DC

## 2016-11-25 MED ORDER — ALIGN 4 MG PO CAPS: 1.0000 | ORAL_CAPSULE | Freq: Two times a day (BID) | ORAL | 0 refills | Status: DC

## 2016-11-25 MED ORDER — METRONIDAZOLE 500 MG PO TABS: 500.0000 mg | ORAL_TABLET | Freq: Two times a day (BID) | ORAL | 0 refills | Status: DC

## 2016-11-25 NOTE — Progress Notes (Signed)
Suzanne [name] is a 41 y.o. female here for an acute visit.  History of Present Illness:   Water quality scientist, CMA, acting as scribe for Dr. Juleen China.  HPI  Patient states she continues to have loose bowel movements 3-4 times per day.  She is also having pain in her stomach.  Tried Lyrica for 2 months with no improvement.  Also tried hydrocodone, but this makes her very sleepy.  No relief with hydrocodone.  She has been to see GI and all testing has been normal thus far.  States she was started on Flagyl by GI and she began to feel much better while taking Flagyl.  As soon as the Flagyl was finished, however, she went back to feeling bad again.  She has lost 2 more pounds since her last visit on 09/29/2016.  Patient states she is tired of feeling this way.  PMHx, SurgHx, SocialHx, Medications, and Allergies were reviewed in the Visit Navigator and updated as appropriate.  Current Medications:   .  cetirizine (ZYRTEC) 10 MG tablet, Take 1 tablet (10 mg total) by mouth daily., Disp: 30 tablet, Rfl: 11 .  ferrous sulfate 325 (65 FE) MG tablet, Take 1 tablet (325 mg total) by mouth daily with breakfast., Disp: 30 tablet, Rfl: 3 .  lansoprazole (PREVACID) 30 MG capsule, Take 30 minutes prior to breakfast, Disp: 90 capsule, Rfl: 3 .  Multiple Vitamin (MULTIVITAMIN WITH MINERALS) TABS tablet, Take 1 tablet by mouth daily., Disp: , Rfl:  .  promethazine (PHENERGAN) 25 MG tablet, Take 1 tablet (25 mg total) by mouth every 6 (six) hours as needed for nausea or vomiting., Disp: 12 tablet, Rfl: 3  Allergies  Allergen Reactions  . Reglan [Metoclopramide] Shortness Of Breath  . Lisinopril Cough  . Losartan Cough  . Sulfa Antibiotics Nausea And Vomiting  . Amoxicillin Rash  . Contrast Media [Iodinated Diagnostic Agents] Rash   Review of Systems:   Review of Systems  All other systems reviewed and are negative.  Vitals:   Vitals:   11/25/16 1042  BP: 124/72  Pulse: 74  Temp: 98.3 F (36.8 C)    TempSrc: Oral  SpO2: 100%  Weight: 136 lb 9.6 oz (62 kg)  Height: 5' 4.5" (1.638 m)     Body mass index is 23.09 kg/m.  Physical Exam:   Physical Exam  Constitutional: She appears well-developed and well-nourished. No distress.  HENT:  Head: Normocephalic and atraumatic.  Eyes: EOM are normal. Pupils are equal, round, and reactive to light.  Neck: Normal range of motion. Neck supple.  Cardiovascular: Normal rate, regular rhythm, normal heart sounds and intact distal pulses.   Pulmonary/Chest: Effort normal.  Abdominal: Soft. Bowel sounds are normal. There is no rebound and no guarding.  Skin: Skin is warm.  Psychiatric: She has a normal mood and affect. Her behavior is normal.  Nursing note and vitals reviewed.  Assessment and Plan:   Suzanne [name] was seen today for acute visit.  Diagnoses and all orders for this visit:  Chronic diarrhea Comments: WORSE. Low FODMAP diet information provided. Labs and treatment below. I have asked the patient to make an appointment with Inda Coke, PA and RD for visit to discuss diet strategies.  Orders: -     metroNIDAZOLE (FLAGYL) 500 MG tablet; Take 1 tablet (500 mg total) by mouth 2 (two) times daily. -     dicyclomine (BENTYL) 10 MG capsule; Take 1 capsule (10 mg total) by mouth 4 (four) times daily -  before meals and at bedtime. -     Comprehensive metabolic panel -     H. pylori antibody, IgG -     Celiac Pnl 2 rflx Endomysial Ab Ttr -     Alpha galactosidase -     HIV antibody -     Probiotic Product (ALIGN) 4 MG CAPS; Take 1 capsule (4 mg total) by mouth 2 (two) times daily. -     CBC with Differential/Platelet  Unintentional weight loss Comments: STABLE. Reviewed ways to increase protein/calories.  Orders: -     HIV antibody  Daily nausea Comments: IMPROVED. The patient no longer has daily nasuea with use of Prevacid. Now, uses Phenergen sparingly. This is a massive improvement.    . Reviewed expectations re: course  of current medical issues. . Discussed self-management of symptoms. . Outlined signs and symptoms indicating need for more acute intervention. . Patient verbalized understanding and all questions were answered. Marland Kitchen Health Maintenance issues including appropriate healthy diet, exercise, and smoking avoidance were discussed with patient. . See orders for this visit as documented in the electronic medical record. . Patient received an After Visit Summary.  CMA served as Education administrator during this visit. History, Physical, and Plan performed by medical provider. The above documentation has been reviewed and is accurate and complete. Briscoe Deutscher, D.O.  Briscoe Deutscher, DO [place] 11/26/2016  Future Appointments Date Time Provider [place]  12/02/2016 3:30 PM Inda Coke, [place] [facility] None

## 2016-11-26 LAB — HIV ANTIBODY (ROUTINE TESTING W REFLEX): HIV 1&2 Ab, 4th Generation: NONREACTIVE

## 2016-11-30 LAB — CELIAC PNL 2 RFLX ENDOMYSIAL AB TTR
(tTG) Ab, IgA: <1 U/mL
(tTG) Ab, IgG: 3 U/mL
Endomysial Ab IgA: NEGATIVE
Gliadin(Deam) Ab,IgA: 3 U (ref <20)
Gliadin(Deam) Ab,IgG: 3 U (ref <20)
Immunoglobulin A: 151 mg/dL (ref 81–463)

## 2016-12-02 ENCOUNTER — Encounter: Payer: Self-pay | Admitting: Physician Assistant

## 2016-12-02 ENCOUNTER — Ambulatory Visit (INDEPENDENT_AMBULATORY_CARE_PROVIDER_SITE_OTHER): Payer: BLUE CROSS/BLUE SHIELD | Admitting: Physician Assistant

## 2016-12-02 ENCOUNTER — Ambulatory Visit (INDEPENDENT_AMBULATORY_CARE_PROVIDER_SITE_OTHER): Payer: BLUE CROSS/BLUE SHIELD | Admitting: *Deleted

## 2016-12-02 VITALS — BP 180/100 | HR 68 | Temp 98.9°F | Ht 64.5 in | Wt 143.0 lb

## 2016-12-02 DIAGNOSIS — R634 Abnormal weight loss: Secondary | ICD-10-CM | POA: Diagnosis not present

## 2016-12-02 DIAGNOSIS — Z713 Dietary counseling and surveillance: Secondary | ICD-10-CM | POA: Diagnosis not present

## 2016-12-02 DIAGNOSIS — E538 Deficiency of other specified B group vitamins: Secondary | ICD-10-CM

## 2016-12-02 DIAGNOSIS — I10 Essential (primary) hypertension: Secondary | ICD-10-CM

## 2016-12-02 MED ORDER — CYANOCOBALAMIN 1000 MCG/ML IJ SOLN
1000.0000 ug | Freq: Once | INTRAMUSCULAR | Status: AC
  Administered 2016-12-02: 1000 ug via INTRAMUSCULAR

## 2016-12-02 NOTE — Patient Instructions (Addendum)
Eat every 2--4 hours while awake.  Try to drink smoothies.  Add oil or butter to vegetables.  Consider decreasing to coffee to see if it helps with increasing appetite during the day.

## 2016-12-02 NOTE — Progress Notes (Signed)
Suzanne [name] is a 41 y.o. female here for Nutrition Consult.  I acted as a Education administrator for [employer], PA-C Anselmo Pickler, LPN  History of Present Illness:   Chief Complaint  Patient presents with  . Nutrition Counseling    Pt here today for Nutrition consult to help gain weight. She would like to gain at least 30 lbs.    Wt Readings from Last 10 Encounters:  12/02/16 143 lb (64.9 kg)  11/25/16 136 lb 9.6 oz (62 kg)  09/29/16 138 lb 3.2 oz (62.7 kg)  09/20/16 144 lb (65.3 kg)  09/15/16 140 lb 9.6 oz (63.8 kg)  08/31/16 138 lb 6.4 oz (62.8 kg)  08/12/16 143 lb 1 oz (64.9 kg)  08/12/16 144 lb 4 oz (65.4 kg)  08/03/16 145 lb 12.8 oz (66.1 kg)  12/24/15 157 lb (71.2 kg)   Most comfortable around 180 lb. Started losing the weight around April 2017. She has had multiple lab tests and imaging studies that were unrevealing for her weight loss.  Dietary recall: When she wakes up she drinks a bottle of water or cup of soda Breakfast: sausage biscuit with hashbrowns with coffee (3 cups, each with 10 sugars and 2 creamers) 2p -- chinese food, chicken nuggets, drink Sprite or Dr. Malachi Bonds 8:30 or 9 -- macaroni cheese, chicken, veggies, mashed potatoes, etc Snacks until about midnight -- fruit, bag of cheese, apples, PB&J, cereal, ANYTHING she can get her hands on  She states that she always hungry. After she eats, she always has a bowel movement. Sometimes immediately and sometimes after about an hour. No exercise, ever. Has never been on an elimination diet.  Has a history of not tolerating beef -- causes diarrhea. Has noticed that dairy products can aggravate her GI symptoms, specifically milk.  Her weight has caused significant depression and anxiety.  Blood pressure -- has a history of HTN but is currently not on any medication. Came off of her medication once she lost a lot of weight. Denies chest pain, SOB, blurred vision, HA's, leg swelling.  Past Medical History:  Diagnosis  Date  . Allergy   . Anemia   . Anxiety   . Arthritis   . Asthma   . Chronic lower back pain   . Depression   . GERD (gastroesophageal reflux disease)   . Hypertension   . Migraine   . Neck pain, chronic      Social History   Social History  . Marital status: Married    Spouse name: [name]  . Number of children: 2  . Years of education: College   Occupational History  . [employer]   .  Other    [employer]   Social History Main Topics  . Smoking status: Current Some Day Smoker    Packs/day: 0.10    Years: 10.00    Types: Cigars  . Smokeless tobacco: Never Used     Comment: smokes 2 Black & Milds per day 08/19/13  . Alcohol use Yes     Comment: 1-2 times yearly  . Drug use: No  . Sexual activity: Yes    Birth control/ protection: Other-see comments, Surgical   Other Topics Concern  . Not on file   Social History Narrative   Patient lives at home with family.   Patient goes to [employer].   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  Past Surgical History:  Procedure Laterality Date  . ESOPHAGEAL DILATION    . ESOPHAGEAL MANOMETRY N/A 04/01/2013   Procedure: ESOPHAGEAL MANOMETRY (EM);  Surgeon: Milus Banister, MD;  Location: [facility];  Service: Endoscopy;  Laterality: N/A;  . ESOPHAGOGASTRODUODENOSCOPY (EGD) WITH ESOPHAGEAL DILATION N/A 02/28/2013   Procedure: ESOPHAGOGASTRODUODENOSCOPY (EGD) WITH ESOPHAGEAL DILATION;  Surgeon: Milus Banister, MD;  Location: [facility];  Service: Endoscopy;  Laterality: N/A;  . TUBAL LIGATION    . VAGINAL HYSTERECTOMY      Family History  Problem Relation Age of Onset  . Heart disease Father   . Hypertension Mother   . Hyperlipidemia Maternal Grandmother   . Hypertension Maternal Grandmother   . Hypertension Paternal Grandmother   . Diabetes Paternal Grandmother   . Cancer - Lung Paternal Grandmother   . Breast cancer Neg Hx     Allergies  Allergen Reactions  . Reglan [Metoclopramide] Shortness Of Breath  . Lisinopril Cough  . Losartan Cough  . Sulfa Antibiotics Nausea And Vomiting  . Amoxicillin Rash      . Contrast Media [Iodinated Diagnostic Agents] Rash       Current Medications:   Current Outpatient Prescriptions:  .  cetirizine (ZYRTEC) 10 MG tablet, Take 1 tablet (10 mg total) by mouth daily., Disp: 30 tablet, Rfl: 11 .  dicyclomine (BENTYL) 10 MG capsule, Take 1 capsule (10 mg total) by mouth 4 (four) times daily -  before meals and at bedtime., Disp: 30 capsule, Rfl: 0 .  ferrous sulfate 325 (65 FE) MG tablet, Take 1 tablet (325 mg total) by mouth daily with breakfast., Disp: 30 tablet, Rfl: 3 .  lansoprazole (PREVACID) 30 MG capsule, Take 30 minutes prior to breakfast, Disp: 90 capsule, Rfl: 3 .  metroNIDAZOLE (FLAGYL) 500 MG tablet, Take 1 tablet (500 mg total) by mouth 2 (two) times daily., Disp: 20 tablet, Rfl: 0 .  Multiple Vitamin (MULTIVITAMIN WITH MINERALS) TABS tablet, Take 1 tablet by mouth daily., Disp: , Rfl:  .  Probiotic Product (ALIGN) 4 MG CAPS, Take 1 capsule (4 mg total) by mouth 2 (two) times daily., Disp: 60 capsule, Rfl: 0 .  promethazine (PHENERGAN) 25 MG tablet, Take 1 tablet (25 mg total) by mouth every 6 (six) hours as needed for nausea or vomiting., Disp: 12 tablet, Rfl: 3   Review of Systems:   Review of Systems  Constitutional: Positive for weight loss. Negative for chills, fever and malaise/fatigue.  Respiratory: Negative for cough.   Cardiovascular: Negative for chest pain, palpitations and orthopnea.  Genitourinary: Negative for dysuria.  Neurological: Negative for dizziness and headaches.  Psychiatric/Behavioral: Positive for depression. The patient is nervous/anxious.       Vitals:   Vitals:   12/02/16 1533 12/02/16 1615  BP: (!) 160/90 (!) 180/100  Pulse: 68   Temp: 98.9 F (37.2 C)   TempSrc: Oral   Weight: 143 lb (64.9 kg)   Height: 5' 4.5"  (1.638 m)      Body mass index is 24.17 kg/m.  Physical Exam:   Physical Exam  Constitutional: She appears well-developed. She is cooperative.  Non-toxic appearance. She does not have a sickly appearance. She does not appear ill. No distress.  Cardiovascular: Normal rate, regular rhythm, S1 normal, S2 normal, normal heart sounds and normal pulses.   No LE edema  Pulmonary/Chest: Effort normal and breath sounds normal.  Neurological: She is alert.  Psychiatric: She has a normal mood and affect. Her speech is normal and behavior  is normal. Thought content normal.  Nursing note and vitals reviewed.     Assessment and Plan:    Satonya was seen today for nutrition counseling.  Diagnoses and all orders for this visit:  Encounter for nutritional counseling  --> Unintentional weight loss Provided handouts including: High Calorie High Protein, 2400 calorie checklist and snack list ideas. Made the following goals for patient: 1. Eat every 2--4 hours while awake. 2. Try to drink smoothies. If you can tolerate them, drink with meals, and not to replace meals. 3. Add oil or butter to vegetables. 4. Consider decreasing coffee to see if it helps with increasing appetite during the day.  Essential hypertension Blood pressures are highly variable per chart review. I advised for her to follow-up for RN blood pressure check in 2 weeks.   Per chart review --> "pancreatice elastase fecal" order was never completed. Our lab staff gave patient a stool sample cup to submit to complete testing per future order completed by Dr. Owens Loffler in [facility] on 09/26/16.   . Reviewed expectations re: course of current medical issues. . Discussed self-management of symptoms. . Outlined signs and symptoms indicating need for more acute intervention. . Patient verbalized understanding and all questions were answered. . See orders for this visit as documented in the electronic medical record. . Patient  received an After-Visit Summary.  CMA or LPN served as scribe during this visit. History, Physical, and Plan performed by medical provider. Documentation and orders reviewed and attested to.  Inda Coke, PA-C

## 2016-12-05 LAB — ALPHA GALACTOSIDASE: Alpha-Galactosidase activity: 33 nmol/hr/mg prt (ref 28.0–80.0)

## 2017-01-02 ENCOUNTER — Telehealth: Payer: Self-pay | Admitting: *Deleted

## 2017-01-02 ENCOUNTER — Ambulatory Visit (INDEPENDENT_AMBULATORY_CARE_PROVIDER_SITE_OTHER): Payer: BLUE CROSS/BLUE SHIELD | Admitting: *Deleted

## 2017-01-02 DIAGNOSIS — E538 Deficiency of other specified B group vitamins: Secondary | ICD-10-CM

## 2017-01-02 MED ORDER — CYANOCOBALAMIN 1000 MCG/ML IJ SOLN
1000.0000 ug | Freq: Once | INTRAMUSCULAR | Status: AC
  Administered 2017-01-02: 1000 ug via INTRAMUSCULAR

## 2017-01-02 NOTE — Telephone Encounter (Signed)
I will send referral to [facility].

## 2017-01-02 NOTE — Telephone Encounter (Signed)
Your last note mentions referring her to a rheumatologist if EMG was normal.  Ok to refer?

## 2017-01-02 NOTE — Telephone Encounter (Signed)
OK to refer for fibromyalgia.

## 2017-01-17 ENCOUNTER — Encounter: Payer: Self-pay | Admitting: *Deleted

## 2017-02-03 ENCOUNTER — Ambulatory Visit (INDEPENDENT_AMBULATORY_CARE_PROVIDER_SITE_OTHER): Payer: BLUE CROSS/BLUE SHIELD | Admitting: *Deleted

## 2017-02-03 DIAGNOSIS — E538 Deficiency of other specified B group vitamins: Secondary | ICD-10-CM

## 2017-02-03 MED ORDER — CYANOCOBALAMIN 1000 MCG/ML IJ SOLN
1000.0000 ug | Freq: Once | INTRAMUSCULAR | Status: AC
Start: 2017-02-03 — End: 2017-02-03
  Administered 2017-02-03: 1000 ug via INTRAMUSCULAR

## 2017-03-07 ENCOUNTER — Ambulatory Visit (INDEPENDENT_AMBULATORY_CARE_PROVIDER_SITE_OTHER): Payer: BLUE CROSS/BLUE SHIELD | Admitting: *Deleted

## 2017-03-07 DIAGNOSIS — E538 Deficiency of other specified B group vitamins: Secondary | ICD-10-CM

## 2017-03-07 MED ORDER — CYANOCOBALAMIN 1000 MCG/ML IJ SOLN
1000.0000 ug | Freq: Once | INTRAMUSCULAR | Status: AC
  Administered 2017-03-07: 1000 ug via INTRAMUSCULAR

## 2017-03-08 ENCOUNTER — Other Ambulatory Visit: Payer: Self-pay | Admitting: *Deleted

## 2017-03-08 DIAGNOSIS — R52 Pain, unspecified: Secondary | ICD-10-CM

## 2017-03-08 DIAGNOSIS — R531 Weakness: Secondary | ICD-10-CM

## 2017-03-08 NOTE — Progress Notes (Unsigned)
amb  

## 2017-03-09 ENCOUNTER — Other Ambulatory Visit: Payer: Self-pay | Admitting: *Deleted

## 2017-03-09 DIAGNOSIS — R531 Weakness: Secondary | ICD-10-CM

## 2017-03-09 DIAGNOSIS — R202 Paresthesia of skin: Secondary | ICD-10-CM

## 2017-03-09 DIAGNOSIS — M542 Cervicalgia: Secondary | ICD-10-CM

## 2017-03-09 DIAGNOSIS — R52 Pain, unspecified: Secondary | ICD-10-CM

## 2017-04-07 ENCOUNTER — Ambulatory Visit: Payer: BLUE CROSS/BLUE SHIELD

## 2017-04-11 ENCOUNTER — Ambulatory Visit (INDEPENDENT_AMBULATORY_CARE_PROVIDER_SITE_OTHER): Payer: BLUE CROSS/BLUE SHIELD | Admitting: *Deleted

## 2017-04-11 DIAGNOSIS — E538 Deficiency of other specified B group vitamins: Secondary | ICD-10-CM

## 2017-04-11 MED ORDER — CYANOCOBALAMIN 1000 MCG/ML IJ SOLN
1000.0000 ug | Freq: Once | INTRAMUSCULAR | Status: AC
  Administered 2017-04-11: 1000 ug via INTRAMUSCULAR

## 2017-05-11 ENCOUNTER — Ambulatory Visit (INDEPENDENT_AMBULATORY_CARE_PROVIDER_SITE_OTHER): Payer: BLUE CROSS/BLUE SHIELD | Admitting: *Deleted

## 2017-05-11 DIAGNOSIS — E538 Deficiency of other specified B group vitamins: Secondary | ICD-10-CM

## 2017-05-11 MED ORDER — CYANOCOBALAMIN 1000 MCG/ML IJ SOLN
1000.0000 ug | Freq: Once | INTRAMUSCULAR | Status: AC
  Administered 2017-05-11: 1000 ug via INTRAMUSCULAR

## 2017-05-12 ENCOUNTER — Ambulatory Visit: Payer: Self-pay | Admitting: *Deleted

## 2017-05-12 NOTE — Telephone Encounter (Signed)
Pain  r  Side   Worse   With  Movement   And  Cough  Pain is  Low   Denies  Any urinary  Symptoms   Denies   Any  Vomiting or  Diarrhea  Pt reports pain  Is  8-10  Pt   Thinks   She   Still  Has  Her appendix  Cassie at  [place]  With dr  Juleen China  Pt  Advised    To  Go to  [facility]   Pt  Advised  To  Remain npo    Reason for Disposition . [1] SEVERE pain (e.g., excruciating) AND [2] present > 1 hour  Answer Assessment - Initial Assessment Questions 1. LOCATION: "Where does it hurt?"      R  Side    Lower   2. RADIATION: "Does the pain shoot anywhere else?" (e.g., chest, back)     Radiates  Around  To  Buttocks    3. ONSET: "When did the pain begin?" (e.g., minutes, hours or days ago)        3  Days    4. SUDDEN: "Gradual or sudden onset?"      sudden 5. PATTERN "Does the pain come and go, or is it constant?"    - If constant: "Is it getting better, staying the same, or worsening?"      (Note: Constant means the pain never goes away completely; most serious pain is constant and it progresses)     - If intermittent: "How long does it last?" "Do you have pain now?"     (Note: Intermittent means the pain goes away completely between bouts)      constant 6. SEVERITY: "How bad is the pain?"  (e.g., Scale 1-10; mild, moderate, or severe)   - MILD (1-3): doesn't interfere with normal activities, abdomen soft and not tender to touch    - MODERATE (4-7): interferes with normal activities or awakens from sleep, tender to touch    - SEVERE (8-10): excruciating pain, doubled over, unable to do any normal activities      Severe  7. RECURRENT SYMPTOM: "Have you ever had this type of abdominal pain before?" If so, ask: "When was the last time?" and "What happened that time?"      none 8. CAUSE: "What do you think is causing the abdominal pain?"      None  9. RELIEVING/AGGRAVATING FACTORS: "What makes it better or worse?" (e.g., movement, antacids, bowel movement)      Movement    10. OTHER SYMPTOMS: "Has there been any vomiting, diarrhea, constipation, or urine problems?"        Pt  Denies    11. PREGNANCY: "Is there any chance you are pregnant?" "When was your last menstrual period?"        Hysterectomy  Protocols used: ABDOMINAL PAIN - [facility]

## 2017-05-12 NOTE — Telephone Encounter (Signed)
Please be advised.  °

## 2017-05-12 NOTE — Telephone Encounter (Signed)
FYI

## 2017-05-22 ENCOUNTER — Telehealth: Payer: Self-pay | Admitting: *Deleted

## 2017-05-22 NOTE — Telephone Encounter (Signed)
Called patient and left message that she can call to cancel if she would like if she has appointment with rheumatologist.

## 2017-05-24 ENCOUNTER — Ambulatory Visit: Payer: BLUE CROSS/BLUE SHIELD | Admitting: Neurology

## 2017-06-12 ENCOUNTER — Ambulatory Visit (INDEPENDENT_AMBULATORY_CARE_PROVIDER_SITE_OTHER): Payer: BLUE CROSS/BLUE SHIELD | Admitting: *Deleted

## 2017-06-12 DIAGNOSIS — E538 Deficiency of other specified B group vitamins: Secondary | ICD-10-CM

## 2017-06-12 MED ORDER — CYANOCOBALAMIN 1000 MCG/ML IJ SOLN
1000.0000 ug | Freq: Once | INTRAMUSCULAR | Status: AC
  Administered 2017-06-12: 1000 ug via INTRAMUSCULAR

## 2017-11-03 ENCOUNTER — Encounter: Payer: Self-pay | Admitting: Family Medicine

## 2017-11-03 ENCOUNTER — Ambulatory Visit (INDEPENDENT_AMBULATORY_CARE_PROVIDER_SITE_OTHER): Payer: BLUE CROSS/BLUE SHIELD | Admitting: Family Medicine

## 2017-11-03 VITALS — BP 188/110 | Temp 98.5°F | Ht 64.5 in | Wt 145.4 lb

## 2017-11-03 DIAGNOSIS — R079 Chest pain, unspecified: Secondary | ICD-10-CM | POA: Diagnosis not present

## 2017-11-03 DIAGNOSIS — R1013 Epigastric pain: Secondary | ICD-10-CM

## 2017-11-03 DIAGNOSIS — F4321 Adjustment disorder with depressed mood: Secondary | ICD-10-CM

## 2017-11-03 DIAGNOSIS — R5381 Other malaise: Secondary | ICD-10-CM

## 2017-11-03 DIAGNOSIS — R5383 Other fatigue: Secondary | ICD-10-CM

## 2017-11-03 DIAGNOSIS — M791 Myalgia, unspecified site: Secondary | ICD-10-CM | POA: Diagnosis not present

## 2017-11-03 DIAGNOSIS — I10 Essential (primary) hypertension: Secondary | ICD-10-CM

## 2017-11-03 LAB — COMPREHENSIVE METABOLIC PANEL
ALT: 12 U/L (ref 0–35)
AST: 14 U/L (ref 0–37)
Albumin: 4 g/dL (ref 3.5–5.2)
Alkaline Phosphatase: 57 U/L (ref 39–117)
BUN: 16 mg/dL (ref 6–23)
CO2: 31 mEq/L (ref 19–32)
Calcium: 9.3 mg/dL (ref 8.4–10.5)
Chloride: 104 mEq/L (ref 96–112)
Creatinine, Ser: 0.81 mg/dL (ref 0.40–1.20)
GFR: 99.59 mL/min (ref >60.00)
Glucose, Bld: 91 mg/dL (ref 70–99)
Potassium: 4.1 mEq/L (ref 3.5–5.1)
Sodium: 140 mEq/L (ref 135–145)
Total Bilirubin: 0.2 mg/dL (ref 0.2–1.2)
Total Protein: 7.1 g/dL (ref 6.0–8.3)

## 2017-11-03 LAB — CBC WITH DIFFERENTIAL/PLATELET
Basophils Absolute: 0 10*3/uL (ref 0.0–0.1)
Basophils Relative: 0.6 % (ref 0.0–3.0)
Eosinophils Absolute: 0.5 10*3/uL (ref 0.0–0.7)
Eosinophils Relative: 8.9 % — ABNORMAL HIGH (ref 0.0–5.0)
HCT: 34.1 % — ABNORMAL LOW (ref 36.0–46.0)
Hemoglobin: 11 g/dL — ABNORMAL LOW (ref 12.0–15.0)
Lymphocytes Relative: 36.8 % (ref 12.0–46.0)
Lymphs Abs: 2 10*3/uL (ref 0.7–4.0)
MCHC: 32.2 g/dL (ref 30.0–36.0)
MCV: 75 fl — ABNORMAL LOW (ref 78.0–100.0)
Monocytes Absolute: 0.5 10*3/uL (ref 0.1–1.0)
Monocytes Relative: 8.7 % (ref 3.0–12.0)
Neutro Abs: 2.5 10*3/uL (ref 1.4–7.7)
Neutrophils Relative %: 45 % (ref 43.0–77.0)
Platelets: 209 10*3/uL (ref 150.0–400.0)
RBC: 4.55 Mil/uL (ref 3.87–5.11)
RDW: 15.5 % (ref 11.5–15.5)
WBC: 5.5 10*3/uL (ref 4.0–10.5)

## 2017-11-03 LAB — VITAMIN B12: Vitamin B-12: 774 pg/mL (ref 211–911)

## 2017-11-03 LAB — TSH: TSH: 0.72 u[IU]/mL (ref 0.35–4.50)

## 2017-11-03 LAB — CK: Total CK: 71 U/L (ref 7–177)

## 2017-11-03 MED ORDER — MIRTAZAPINE 15 MG PO TABS: 15.0000 mg | ORAL_TABLET | Freq: Every day | ORAL | 2 refills | Status: DC

## 2017-11-03 MED ORDER — AMLODIPINE BESYLATE 5 MG PO TABS: 5.0000 mg | ORAL_TABLET | Freq: Every day | ORAL | 2 refills | Status: DC

## 2017-11-03 MED ORDER — OMEPRAZOLE 20 MG PO CPDR: 20.0000 mg | DELAYED_RELEASE_CAPSULE | Freq: Every day | ORAL | 3 refills | Status: DC

## 2017-11-03 NOTE — Progress Notes (Signed)
Suzanne [name] is a 42 y.o. female is here for follow up.  History of Present Illness:   HPI: Complains of fatigue. No weight loss. Son died a few months ago. She has not taken time to grieve. Interested in help - counseling and medication. No SI.   Complains of generalized malaise and fatigue. Muscle aches. Not sleeping well. Not interested in food.  Health Maintenance Due  Topic Date Due  . TETANUS/TDAP  07/29/1994   No flowsheet data found.   PMHx, SurgHx, SocialHx, FamHx, Medications, and Allergies were reviewed in the Visit Navigator and updated as appropriate.   Patient Active Problem List   Diagnosis Date Noted  . History of hysterectomy 11/25/2016  . Chronic pain 08/31/2016  . Unintentional weight loss 08/17/2016  . B12 deficiency 08/12/2016  . Daily nausea 08/06/2016  . Microcytic anemia 08/06/2016  . Chronic diarrhea 08/06/2016  . Intestinal malabsorption 08/06/2016  . Lung nodule < 6cm on CT 08/06/2016  . Essential hypertension 07/02/2013  . Other and unspecified ovarian cyst 04/01/2013  . Dysphagia 12/22/2011   Social History   Tobacco Use  . Smoking status: Current Some Day Smoker    Packs/day: 0.10    Years: 10.00    Pack years: 1.00    Types: Cigars  . Smokeless tobacco: Never Used  . Tobacco comment: smokes 2 Black & Milds per day 08/19/13  Substance Use Topics  . Alcohol use: Yes    Comment: 1-2 times yearly  . Drug use: No   Current Medications and Allergies:   .  cetirizine (ZYRTEC) 10 MG tablet, Take 1 tablet (10 mg total) by mouth daily., Disp: 30 tablet, Rfl: 11 .  Multiple Vitamin (MULTIVITAMIN WITH MINERALS) TABS tablet, Take 1 tablet by mouth daily., Disp: , Rfl:  .  Probiotic Product (ALIGN) 4 MG CAPS, Take 1 capsule (4 mg total) by mouth 2 (two) times daily., Disp: 60 capsule, Rfl: 0 .  amLODipine (NORVASC) 5 MG tablet, Take 1 tablet (5 mg total) by mouth daily., Disp: 90 tablet, Rfl: 2    Allergies  Allergen Reactions  . Reglan  [Metoclopramide] Shortness Of Breath  . Lisinopril Cough  . Losartan Cough  . Sulfa Antibiotics Nausea And Vomiting  . Amoxicillin Rash     Has patient had a PCN reaction causing severe rash involving mucus membranes or skin necrosis: Broke bottom and face out.  Has patient had a PCN reaction that required hospitalization No Has patient had a PCN reaction occurring within the last 10 years: No If all of the above answers are "NO", then may proceed with Cephalosporin use.   . Contrast Media [Iodinated Diagnostic Agents] Rash   Review of Systems   Pertinent items are noted in the HPI. Otherwise, ROS is negative.  Vitals:   Vitals:   11/03/17 1345  BP: (!) 188/110  Temp: 98.5 F (36.9 C)  TempSrc: Oral  Weight: 145 lb 6.4 oz (66 kg)  Height: 5' 4.5" (1.638 m)     Body mass index is 24.57 kg/m.  Physical Exam:   Physical Exam  Constitutional: She is oriented to person, place, and time. She appears well-developed and well-nourished. No distress.  HENT:  Head: Normocephalic and atraumatic.  Right Ear: External ear normal.  Left Ear: External ear normal.  Nose: Nose normal.  Mouth/Throat: Oropharynx is clear and moist.  Eyes: Pupils are equal, round, and reactive to light. Conjunctivae and EOM are normal.  Neck: Normal range of motion. Neck supple.  No thyromegaly present.  Cardiovascular: Normal rate, regular rhythm, normal heart sounds and intact distal pulses.  Pulmonary/Chest: Effort normal and breath sounds normal.  Abdominal: Soft. Bowel sounds are normal.  Musculoskeletal: Normal range of motion.  Lymphadenopathy:    She has no cervical adenopathy.  Neurological: She is alert and oriented to person, place, and time.  Skin: Skin is warm and dry. Capillary refill takes less than 2 seconds.  Psychiatric: She has a normal mood and affect. Her behavior is normal.  Nursing note and vitals reviewed.    Results for orders placed or performed in visit on 11/03/17  CBC  with Differential/Platelet  Result Value Ref Range   WBC 5.5 4.0 - 10.5 K/uL   RBC 4.55 3.87 - 5.11 Mil/uL   Hemoglobin 11.0 (L) 12.0 - 15.0 g/dL   HCT 34.1 (L) 36.0 - 46.0 %   MCV 75.0 (L) 78.0 - 100.0 fl   MCHC 32.2 30.0 - 36.0 g/dL   RDW 15.5 11.5 - 15.5 %   Platelets 209.0 150.0 - 400.0 K/uL   Neutrophils Relative % 45.0 43.0 - 77.0 %   Lymphocytes Relative 36.8 12.0 - 46.0 %   Monocytes Relative 8.7 3.0 - 12.0 %   Eosinophils Relative 8.9 (H) 0.0 - 5.0 %   Basophils Relative 0.6 0.0 - 3.0 %   Neutro Abs 2.5 1.4 - 7.7 K/uL   Lymphs Abs 2.0 0.7 - 4.0 K/uL   Monocytes Absolute 0.5 0.1 - 1.0 K/uL   Eosinophils Absolute 0.5 0.0 - 0.7 K/uL   Basophils Absolute 0.0 0.0 - 0.1 K/uL  Comprehensive metabolic panel  Result Value Ref Range   Sodium 140 135 - 145 mEq/L   Potassium 4.1 3.5 - 5.1 mEq/L   Chloride 104 96 - 112 mEq/L   CO2 31 19 - 32 mEq/L   Glucose, Bld 91 70 - 99 mg/dL   BUN 16 6 - 23 mg/dL   Creatinine, Ser 0.81 0.40 - 1.20 mg/dL   Total Bilirubin 0.2 0.2 - 1.2 mg/dL   Alkaline Phosphatase 57 39 - 117 U/L   AST 14 0 - 37 U/L   ALT 12 0 - 35 U/L   Total Protein 7.1 6.0 - 8.3 g/dL   Albumin 4.0 3.5 - 5.2 g/dL   Calcium 9.3 8.4 - 10.5 mg/dL   GFR 99.59 >60.00 mL/min  TSH  Result Value Ref Range   TSH 0.72 0.35 - 4.50 uIU/mL  Vitamin B12  Result Value Ref Range   Vitamin B-12 774 211 - 911 pg/mL  CK  Result Value Ref Range   Total CK 71 7 - 177 U/L    Assessment and Plan:   Suzanne [name] was seen today for fatigue.  Diagnoses and all orders for this visit:  Grief Comments: Given information re: grief counselor.  Orders: -     mirtazapine (REMERON) 15 MG tablet; Take 1 tablet (15 mg total) by mouth at bedtime.  Chest pain, unspecified type -     EKG 12-Lead  Myalgia -     CK  Malaise and fatigue -     CBC with Differential/Platelet -     Comprehensive metabolic panel -     TSH -     Vitamin B12  Dyspepsia -     omeprazole (PRILOSEC) 20 MG capsule;  Take 1 capsule (20 mg total) by mouth daily.  Essential hypertension Comments: Repeat BP was reassuring. Will increase regimen. Orders: -     amLODipine ([place])  5 MG tablet; Take 1 tablet (5 mg total) by mouth daily.    . Reviewed expectations re: course of current medical issues. . Discussed self-management of symptoms. . Outlined signs and symptoms indicating need for more acute intervention. . Patient verbalized understanding and all questions were answered. Marland Kitchen Health Maintenance issues including appropriate healthy diet, exercise, and smoking avoidance were discussed with patient. . See orders for this visit as documented in the electronic medical record. . Patient received an After Visit Summary.  Briscoe Deutscher, DO [place] 11/12/2017  Future Appointments  Date Time Provider [place]  12/04/2017  9:20 AM Briscoe Deutscher, DO [facility]

## 2017-12-02 NOTE — Progress Notes (Signed)
Suzanne [name] is a 43 y.o. female is here for follow up.  History of Present Illness:   Lonell Grandchild, CMA acting as scribe for Dr. Briscoe Deutscher.   HPI: Patient in for follow up on depression. She was given Remeron at last visit and she did not like the way it made her feel. She only took three times. States it made her woozy and she could not sleep.   Chest pain. She is still having chest pains on right side that go into back. She has no productive cough at night and used a nebulizer that helped break up congestion over the weekend but did not help with chest pain. When she is laying down and takes a deep breath in she has the pain more. She does have history of asthma but has not had problems in several years.   Review of Systems  Constitutional: Positive for malaise/fatigue and weight loss. Negative for fever.  HENT: Negative for congestion, ear discharge, ear pain and sinus pain.   Respiratory: Positive for cough and wheezing.   Cardiovascular: Negative for chest pain, palpitations and leg swelling.  Gastrointestinal: Negative for blood in stool, constipation, diarrhea and heartburn.  Skin: Negative for rash.  Neurological: Negative for dizziness and headaches.  Psychiatric/Behavioral: Positive for depression. Negative for suicidal ideas.    Health Maintenance Due  Topic Date Due  . TETANUS/TDAP  07/29/1994   Depression screen PHQ 2/9 12/04/2017  Decreased Interest 3  Down, Depressed, Hopeless 2  PHQ - 2 Score 5  Altered sleeping 2  Tired, decreased energy 3  Change in appetite 3  Feeling bad or failure about yourself  0  Trouble concentrating 0  Moving slowly or fidgety/restless 0  Suicidal thoughts 0  PHQ-9 Score 13  Difficult doing work/chores Extremely dIfficult   PMHx, SurgHx, SocialHx, FamHx, Medications, and Allergies were reviewed in the Visit Navigator and updated as appropriate.   Patient Active Problem List   Diagnosis Date Noted  . History of  hysterectomy 11/25/2016  . Chronic pain 08/31/2016  . Unintentional weight loss 08/17/2016  . B12 deficiency 08/12/2016  . Daily nausea 08/06/2016  . Microcytic anemia 08/06/2016  . Chronic diarrhea 08/06/2016  . Intestinal malabsorption 08/06/2016  . Lung nodule < 6cm on CT 08/06/2016  . Essential hypertension 07/02/2013  . Other and unspecified ovarian cyst 04/01/2013  . Dysphagia 12/22/2011   Social History   Tobacco Use  . Smoking status: Current Some Day Smoker    Packs/day: 0.10    Years: 10.00    Pack years: 1.00    Types: Cigars  . Smokeless tobacco: Never Used  . Tobacco comment: smokes 2 Black & Milds per day 08/19/13  Substance Use Topics  . Alcohol use: Yes    Comment: 1-2 times yearly  . Drug use: No   Current Medications and Allergies:   .  amLODipine (NORVASC) 5 MG tablet, Take 1 tablet (5 mg total) by mouth daily., Disp: 90 tablet, Rfl: 2 .  cetirizine (ZYRTEC) 10 MG tablet, Take 1 tablet (10 mg total) by mouth daily., Disp: 30 tablet, Rfl: 11 .  mirtazapine (REMERON) 15 MG tablet, Take 1 tablet (15 mg total) by mouth at bedtime., Disp: 30 tablet, Rfl: 2 .  Multiple Vitamin (MULTIVITAMIN WITH MINERALS) TABS tablet, Take 1 tablet by mouth daily., Disp: , Rfl:  .  omeprazole (PRILOSEC) 20 MG capsule, Take 1 capsule (20 mg total) by mouth daily., Disp: 30 capsule, Rfl: 3 .  Probiotic  Product (ALIGN) 4 MG CAPS, Take 1 capsule (4 mg total) by mouth 2 (two) times daily., Disp: 60 capsule, Rfl: 0   Allergies  Allergen Reactions  . Reglan [Metoclopramide] Shortness Of Breath  . Lisinopril Cough  . Losartan Cough  . Sulfa Antibiotics Nausea And Vomiting  . Amoxicillin Rash  . Contrast Media [Iodinated Diagnostic Agents] Rash   Review of Systems   Pertinent items are noted in the HPI. Otherwise, ROS is negative.  Vitals:   Vitals:   12/04/17 0922  BP: (!) 180/100  Pulse: 88  Temp: 98.7 F (37.1 C)  TempSrc: Oral  Weight: 145 lb 9.6 oz (66 kg)  Height:  5' 4.5" (1.638 m)     Body mass index is 24.61 kg/m.  Physical Exam:   Physical Exam  Constitutional: She appears well-nourished.  HENT:  Head: Normocephalic and atraumatic.  Eyes: Pupils are equal, round, and reactive to light. EOM are normal.  Neck: Normal range of motion. Neck supple.  Cardiovascular: Normal rate, regular rhythm, normal heart sounds and intact distal pulses.  Pulmonary/Chest: Effort normal. She has wheezes. She exhibits tenderness.    Abdominal: Soft.  Skin: Skin is warm.  Psychiatric: She has a normal mood and affect. Her behavior is normal.  Nursing note and vitals reviewed.  Results for orders placed or performed in visit on 11/03/17  CBC with Differential/Platelet  Result Value Ref Range   WBC 5.5 4.0 - 10.5 K/uL   RBC 4.55 3.87 - 5.11 Mil/uL   Hemoglobin 11.0 (L) 12.0 - 15.0 g/dL   HCT 34.1 (L) 36.0 - 46.0 %   MCV 75.0 (L) 78.0 - 100.0 fl   MCHC 32.2 30.0 - 36.0 g/dL   RDW 15.5 11.5 - 15.5 %   Platelets 209.0 150.0 - 400.0 K/uL   Neutrophils Relative % 45.0 43.0 - 77.0 %   Lymphocytes Relative 36.8 12.0 - 46.0 %   Monocytes Relative 8.7 3.0 - 12.0 %   Eosinophils Relative 8.9 (H) 0.0 - 5.0 %   Basophils Relative 0.6 0.0 - 3.0 %   Neutro Abs 2.5 1.4 - 7.7 K/uL   Lymphs Abs 2.0 0.7 - 4.0 K/uL   Monocytes Absolute 0.5 0.1 - 1.0 K/uL   Eosinophils Absolute 0.5 0.0 - 0.7 K/uL   Basophils Absolute 0.0 0.0 - 0.1 K/uL  Comprehensive metabolic panel  Result Value Ref Range   Sodium 140 135 - 145 mEq/L   Potassium 4.1 3.5 - 5.1 mEq/L   Chloride 104 96 - 112 mEq/L   CO2 31 19 - 32 mEq/L   Glucose, Bld 91 70 - 99 mg/dL   BUN 16 6 - 23 mg/dL   Creatinine, Ser 0.81 0.40 - 1.20 mg/dL   Total Bilirubin 0.2 0.2 - 1.2 mg/dL   Alkaline Phosphatase 57 39 - 117 U/L   AST 14 0 - 37 U/L   ALT 12 0 - 35 U/L   Total Protein 7.1 6.0 - 8.3 g/dL   Albumin 4.0 3.5 - 5.2 g/dL   Calcium 9.3 8.4 - 10.5 mg/dL   GFR 99.59 >60.00 mL/min  TSH  Result Value Ref Range     TSH 0.72 0.35 - 4.50 uIU/mL  Vitamin B12  Result Value Ref Range   Vitamin B-12 774 211 - 911 pg/mL  CK  Result Value Ref Range   Total CK 71 7 - 177 U/L    Assessment and Plan:   Suzanne [name] was seen today for follow-up.  Diagnoses and  all orders for this visit:  Cough Comments: XRAY without acute findings. Waiting for Radiology read. Asthma exacerbation. Will advance to Flovent BID temporarily. Orders: -     DG Chest 2 View; Future -     fluticasone (FLOVENT HFA) 110 MCG/ACT inhaler; Inhale 1 puff into the lungs 2 (two) times daily.  Essential hypertension Comments: Not at goal. Medication increase today. Orders: -     amLODipine (NORVASC) 10 MG tablet; Take 1 tablet (10 mg total) by mouth daily. -     hydrochlorothiazide (MICROZIDE) 12.5 MG capsule; Take 1 capsule (12.5 mg total) by mouth daily.  Microcytic anemia Comments: Labs today. Orders: -     Iron, TIBC and Ferritin Panel  Grief Comments: Directing to [facility].  Screening, lipid Comments: Due for screening.  Orders: -     Lipid panel -     Comprehensive metabolic panel  Chest wall pain Comments: Mobic for costochondritis. Revewed red flags.  Orders: -     meloxicam (MOBIC) 15 MG tablet; Take 1 tablet (15 mg total) by mouth daily.    . Reviewed expectations re: course of current medical issues. . Discussed self-management of symptoms. . Outlined signs and symptoms indicating need for more acute intervention. . Patient verbalized understanding and all questions were answered. Marland Kitchen Health Maintenance issues including appropriate healthy diet, exercise, and smoking avoidance were discussed with patient. . See orders for this visit as documented in the electronic medical record. . Patient received an After Visit Summary.  Briscoe Deutscher, DO [place], [place] 12/04/2017  No future appointments.  CMA served as Education administrator during this visit. History, Physical, and Plan performed by medical provider.  The above documentation has been reviewed and is accurate and complete. Briscoe Deutscher, D.O.

## 2017-12-04 ENCOUNTER — Ambulatory Visit (INDEPENDENT_AMBULATORY_CARE_PROVIDER_SITE_OTHER): Payer: BLUE CROSS/BLUE SHIELD

## 2017-12-04 ENCOUNTER — Ambulatory Visit (INDEPENDENT_AMBULATORY_CARE_PROVIDER_SITE_OTHER): Payer: BLUE CROSS/BLUE SHIELD | Admitting: Family Medicine

## 2017-12-04 ENCOUNTER — Encounter: Payer: Self-pay | Admitting: Family Medicine

## 2017-12-04 VITALS — BP 180/100 | HR 88 | Temp 98.7°F | Ht 64.5 in | Wt 145.6 lb

## 2017-12-04 DIAGNOSIS — R0602 Shortness of breath: Secondary | ICD-10-CM | POA: Diagnosis not present

## 2017-12-04 DIAGNOSIS — I10 Essential (primary) hypertension: Secondary | ICD-10-CM | POA: Diagnosis not present

## 2017-12-04 DIAGNOSIS — R05 Cough: Secondary | ICD-10-CM

## 2017-12-04 DIAGNOSIS — D509 Iron deficiency anemia, unspecified: Secondary | ICD-10-CM

## 2017-12-04 DIAGNOSIS — R0789 Other chest pain: Secondary | ICD-10-CM | POA: Diagnosis not present

## 2017-12-04 DIAGNOSIS — Z13220 Encounter for screening for lipoid disorders: Secondary | ICD-10-CM

## 2017-12-04 DIAGNOSIS — R059 Cough, unspecified: Secondary | ICD-10-CM

## 2017-12-04 DIAGNOSIS — Z23 Encounter for immunization: Secondary | ICD-10-CM | POA: Diagnosis not present

## 2017-12-04 DIAGNOSIS — F4321 Adjustment disorder with depressed mood: Secondary | ICD-10-CM | POA: Diagnosis not present

## 2017-12-04 LAB — COMPREHENSIVE METABOLIC PANEL
ALT: 8 U/L (ref 0–35)
AST: 13 U/L (ref 0–37)
Albumin: 4 g/dL (ref 3.5–5.2)
Alkaline Phosphatase: 54 U/L (ref 39–117)
BUN: 13 mg/dL (ref 6–23)
CO2: 29 mEq/L (ref 19–32)
Calcium: 9.2 mg/dL (ref 8.4–10.5)
Chloride: 104 mEq/L (ref 96–112)
Creatinine, Ser: 0.68 mg/dL (ref 0.40–1.20)
GFR: 121.82 mL/min (ref >60.00)
Glucose, Bld: 88 mg/dL (ref 70–99)
Potassium: 3.9 mEq/L (ref 3.5–5.1)
Sodium: 139 mEq/L (ref 135–145)
Total Bilirubin: 0.3 mg/dL (ref 0.2–1.2)
Total Protein: 6.6 g/dL (ref 6.0–8.3)

## 2017-12-04 LAB — IRON,TIBC AND FERRITIN PANEL
%SAT: 14 % (calc) — ABNORMAL LOW (ref 16–45)
Ferritin: 50 ng/mL (ref 16–232)
Iron: 35 ug/dL — ABNORMAL LOW (ref 40–190)
TIBC: 256 mcg/dL (calc) (ref 250–450)

## 2017-12-04 LAB — LIPID PANEL
Cholesterol: 162 mg/dL (ref 0–200)
HDL: 53.7 mg/dL (ref >39.00)
LDL Cholesterol: 99 mg/dL (ref 0–99)
NonHDL: 107.9
Total CHOL/HDL Ratio: 3
Triglycerides: 47 mg/dL (ref 0.0–149.0)
VLDL: 9.4 mg/dL (ref 0.0–40.0)

## 2017-12-04 MED ORDER — MELOXICAM 15 MG PO TABS
15.0000 mg | ORAL_TABLET | Freq: Every day | ORAL | 0 refills | Status: DC
Start: 2017-12-04 — End: 2018-09-27

## 2017-12-04 MED ORDER — FLUTICASONE PROPIONATE HFA 110 MCG/ACT IN AERO: 1.0000 | INHALATION_SPRAY | Freq: Two times a day (BID) | RESPIRATORY_TRACT | 12 refills | Status: DC

## 2017-12-04 MED ORDER — AMLODIPINE BESYLATE 10 MG PO TABS: 10.0000 mg | ORAL_TABLET | Freq: Every day | ORAL | 2 refills | Status: DC

## 2017-12-04 MED ORDER — HYDROCHLOROTHIAZIDE 12.5 MG PO CAPS: 12.5000 mg | ORAL_CAPSULE | Freq: Every day | ORAL | 3 refills | Status: DC

## 2017-12-04 NOTE — Addendum Note (Signed)
Addended by: Durwin Glaze on: 12/04/2017 10:13 AM   Modules accepted: Orders

## 2017-12-11 ENCOUNTER — Encounter: Payer: Self-pay | Admitting: Family Medicine

## 2017-12-11 ENCOUNTER — Encounter (HOSPITAL_COMMUNITY): Payer: Self-pay | Admitting: Emergency Medicine

## 2017-12-11 ENCOUNTER — Telehealth: Payer: Self-pay

## 2017-12-11 ENCOUNTER — Ambulatory Visit (INDEPENDENT_AMBULATORY_CARE_PROVIDER_SITE_OTHER): Payer: BLUE CROSS/BLUE SHIELD | Admitting: Family Medicine

## 2017-12-11 ENCOUNTER — Emergency Department (HOSPITAL_COMMUNITY)
Admission: EM | Admit: 2017-12-11 | Discharge: 2017-12-12 | Disposition: A | Discharge status: Home / Self Care | Payer: BLUE CROSS/BLUE SHIELD | Attending: Emergency Medicine | Admitting: Emergency Medicine

## 2017-12-11 ENCOUNTER — Other Ambulatory Visit: Payer: Self-pay

## 2017-12-11 ENCOUNTER — Ambulatory Visit: Payer: Self-pay | Admitting: *Deleted

## 2017-12-11 ENCOUNTER — Emergency Department (HOSPITAL_COMMUNITY): Payer: BLUE CROSS/BLUE SHIELD

## 2017-12-11 VITALS — BP 158/108 | HR 84 | Temp 97.8°F | Resp 20 | Ht 64.5 in | Wt 141.0 lb

## 2017-12-11 DIAGNOSIS — R0602 Shortness of breath: Secondary | ICD-10-CM

## 2017-12-11 DIAGNOSIS — R0600 Dyspnea, unspecified: Secondary | ICD-10-CM | POA: Diagnosis not present

## 2017-12-11 DIAGNOSIS — J9801 Acute bronchospasm: Secondary | ICD-10-CM | POA: Diagnosis not present

## 2017-12-11 DIAGNOSIS — I10 Essential (primary) hypertension: Secondary | ICD-10-CM | POA: Diagnosis not present

## 2017-12-11 DIAGNOSIS — Z79899 Other long term (current) drug therapy: Secondary | ICD-10-CM | POA: Diagnosis not present

## 2017-12-11 DIAGNOSIS — J45909 Unspecified asthma, uncomplicated: Secondary | ICD-10-CM | POA: Diagnosis not present

## 2017-12-11 DIAGNOSIS — R0789 Other chest pain: Secondary | ICD-10-CM | POA: Diagnosis not present

## 2017-12-11 DIAGNOSIS — R079 Chest pain, unspecified: Secondary | ICD-10-CM

## 2017-12-11 DIAGNOSIS — F17210 Nicotine dependence, cigarettes, uncomplicated: Secondary | ICD-10-CM | POA: Insufficient documentation

## 2017-12-11 LAB — CBC
HCT: 37.9 % (ref 36.0–46.0)
Hemoglobin: 12 g/dL (ref 12.0–15.0)
MCH: 23.3 pg — ABNORMAL LOW (ref 26.0–34.0)
MCHC: 31.7 g/dL (ref 30.0–36.0)
MCV: 73.6 fL — ABNORMAL LOW (ref 78.0–100.0)
Platelets: 258 10*3/uL (ref 150–400)
RBC: 5.15 MIL/uL — ABNORMAL HIGH (ref 3.87–5.11)
RDW: 14.8 % (ref 11.5–15.5)
WBC: 6.4 10*3/uL (ref 4.0–10.5)

## 2017-12-11 LAB — I-STAT TROPONIN, ED
Troponin i, poc: 0 ng/mL (ref 0.00–0.08)
Troponin i, poc: 0 ng/mL (ref 0.00–0.08)

## 2017-12-11 LAB — I-STAT BETA HCG BLOOD, ED (MC, WL, AP ONLY): I-stat hCG, quantitative: <5 m[IU]/mL (ref <5)

## 2017-12-11 LAB — BASIC METABOLIC PANEL
Anion gap: 10 (ref 5–15)
BUN: 10 mg/dL (ref 6–20)
CO2: 24 mmol/L (ref 22–32)
Calcium: 9.5 mg/dL (ref 8.9–10.3)
Chloride: 104 mmol/L (ref 98–111)
Creatinine, Ser: 0.79 mg/dL (ref 0.44–1.00)
GFR calc Af Amer: >60 mL/min (ref >60)
GFR calc non Af Amer: >60 mL/min (ref >60)
Glucose, Bld: 91 mg/dL (ref 70–99)
Potassium: 4.2 mmol/L (ref 3.5–5.1)
Sodium: 138 mmol/L (ref 135–145)

## 2017-12-11 LAB — D-DIMER, QUANTITATIVE: D-Dimer, Quant: 0.57 ug/mL-FEU — ABNORMAL HIGH (ref 0.00–0.50)

## 2017-12-11 LAB — BRAIN NATRIURETIC PEPTIDE: B Natriuretic Peptide: 16.2 pg/mL (ref 0.0–100.0)

## 2017-12-11 MED ORDER — HYDROCHLOROTHIAZIDE 25 MG PO TABS: 25.0000 mg | ORAL_TABLET | Freq: Every day | ORAL | 1 refills | Status: DC

## 2017-12-11 MED ORDER — MORPHINE SULFATE (PF) 4 MG/ML IV SOLN
4.0000 mg | Freq: Once | INTRAVENOUS | Status: AC
  Administered 2017-12-11: 4 mg via INTRAVENOUS
  Filled 2017-12-11: qty 1

## 2017-12-11 MED ORDER — ALBUTEROL SULFATE (2.5 MG/3ML) 0.083% IN NEBU: 2.5000 mg | INHALATION_SOLUTION | RESPIRATORY_TRACT | 1 refills | Status: DC | PRN

## 2017-12-11 MED ORDER — PREDNISONE 20 MG PO TABS: 40.0000 mg | ORAL_TABLET | Freq: Every day | ORAL | 0 refills | Status: DC

## 2017-12-11 NOTE — Telephone Encounter (Signed)
Pt seen by Dr. Rogers Blocker today 7/15 at 9:15.

## 2017-12-11 NOTE — Telephone Encounter (Signed)
See note

## 2017-12-11 NOTE — ED Triage Notes (Signed)
Patient complains of shortness of breath and chest pain that started last Sunday and has gotten progressive. States pain increase with exertion and shortness of breath increases when laying down. Patient alert, oriented, and in no apparent distress at this time.
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
## 2017-12-11 NOTE — Telephone Encounter (Signed)
Spoke to patient, confirmed safe arrival in ED at [facility].  Pt stated she had repeat EKG and [place] and is currently in waiting room waiting to be called back for evaluation.

## 2017-12-11 NOTE — Telephone Encounter (Signed)
Patient has already been seen. 

## 2017-12-11 NOTE — Patient Instructions (Addendum)
-  I want you to start a baby aspirin at 81mg /day. Can buy over the counter.   -sent in steroid burst for you to take for 5 days. See if you respond to this and can tell cardiologist. Also sent in nebulizer medication to continue as needed.   -I want you to see cardiology. I'm worried about your heart (heart failure from such high blood pressure or your heart not getting oxygen). I think you need a stress test and echo and have sent in urgent referral to see cardiology. Why I want you on baby aspirin. Would do another drug, but can make asthma worse so will wait till seen by cardiology.   Chest pain that is worse or not going away, sweating, etc.. I need you to go to ER to make sure your heart is okay.   Increasing your hctz to 25mg /day. Sent in new prescription at this dose. You can double up on your 12.5mg  to finish those pills. See dr. Juleen China in 2 weeks for BP check

## 2017-12-11 NOTE — ED Provider Notes (Signed)
[facility] EMERGENCY DEPARTMENT Provider Note   CSN: [id number] Arrival date & time: 12/11/17  1117     History   Chief Complaint Chief Complaint  Patient presents with  . Chest Pain    HPI Suzanne [name] is a 42 y.o. female.  HPI Patient presents to the emergency room for evaluation of chest pain as well as shortness of breath.  Patient states her symptoms really started last Sunday.  She had some sharp discomfort in her chest.  She also has had some shortness of breath.  This tends to increase with exertion as well as laying down.  Patient denies any trouble with fevers.  No coughing.  She does have a history of asthma but does not feel like she has been wheezing a lot.  She denies any leg swelling.  No history of heart disease no history of PE.  No recent trips or travel.  No recent surgery.  She went to see her doctor who noted that her blood pressure was elevated.  She was started on blood pressure medications.  She was also given anti-inflammatory pain medications.  Patient states she does not feel much better so she came to the emergency room for evaluation. Past Medical History:  Diagnosis Date  . Allergy   . Anemia   . Anxiety   . Arthritis   . Asthma   . Chronic lower back pain   . Depression   . GERD (gastroesophageal reflux disease)   . Hypertension   . Migraine   . Neck pain, chronic     Patient Active Problem List   Diagnosis Date Noted  . History of hysterectomy 11/25/2016  . Chronic pain 08/31/2016  . Unintentional weight loss 08/17/2016  . B12 deficiency 08/12/2016  . Daily nausea 08/06/2016  . Microcytic anemia 08/06/2016  . Chronic diarrhea 08/06/2016  . Intestinal malabsorption 08/06/2016  . Lung nodule < 6cm on CT 08/06/2016  . Essential hypertension 07/02/2013  . Other and unspecified ovarian cyst 04/01/2013  . Dysphagia 12/22/2011    Past Surgical History:  Procedure Laterality Date  . ESOPHAGEAL DILATION    .  ESOPHAGEAL MANOMETRY N/A 04/01/2013   Procedure: ESOPHAGEAL MANOMETRY (EM);  Surgeon: Milus Banister, MD;  Location: [facility];  Service: Endoscopy;  Laterality: N/A;  . ESOPHAGOGASTRODUODENOSCOPY (EGD) WITH ESOPHAGEAL DILATION N/A 02/28/2013   Procedure: ESOPHAGOGASTRODUODENOSCOPY (EGD) WITH ESOPHAGEAL DILATION;  Surgeon: Milus Banister, MD;  Location: [facility];  Service: Endoscopy;  Laterality: N/A;  . TUBAL LIGATION    . VAGINAL HYSTERECTOMY       OB History    Gravida  2   Para  2   Term      Preterm  2   AB      Living        SAB      TAB      Ectopic      Multiple      Live Births               Home Medications    Prior to Admission medications   Medication Sig Start Date End Date Taking? Authorizing Provider  albuterol (PROVENTIL) (2.5 MG/3ML) 0.083% nebulizer solution Take 3 mLs (2.5 mg total) by nebulization every 4 (four) hours as needed for wheezing or shortness of breath. 12/11/17  Yes Orma Flaming, MD  amLODipine (NORVASC) 10 MG tablet Take 1 tablet (10 mg total) by mouth daily. 12/04/17  Yes Briscoe Deutscher, DO  cetirizine (ZYRTEC) 10 MG tablet Take 1 tablet (10 mg total) by mouth daily. 09/29/16  Yes Briscoe Deutscher, DO  hydrochlorothiazide (HYDRODIURIL) 25 MG tablet Take 1 tablet (25 mg total) by mouth daily. 12/11/17  Yes Orma Flaming, MD  meloxicam (MOBIC) 15 MG tablet Take 1 tablet (15 mg total) by mouth daily. 12/04/17  Yes Briscoe Deutscher, DO  Multiple Vitamins-Calcium (ONE-A-DAY WOMENS FORMULA PO) Take 1 tablet by mouth daily.   Yes [provider]  omeprazole (PRILOSEC) 20 MG capsule Take 1 capsule (20 mg total) by mouth daily. 11/03/17  Yes Briscoe Deutscher, DO  fluticasone (FLOVENT HFA) 110 MCG/ACT inhaler Inhale 1 puff into the lungs 2 (two) times daily. 12/04/17   Briscoe Deutscher, DO  predniSONE (DELTASONE) 20 MG tablet Take 2 tablets (40 mg total) by mouth daily with breakfast. 12/11/17   Orma Flaming, MD    Family History Family  History  Problem Relation Age of Onset  . Heart disease Father   . Hypertension Mother   . Hyperlipidemia Maternal Grandmother   . Hypertension Maternal Grandmother   . Hypertension Paternal Grandmother   . Diabetes Paternal Grandmother   . Cancer - Lung Paternal Grandmother   . Breast cancer Neg Hx     Social History Social History   Tobacco Use  . Smoking status: Current Some Day Smoker    Packs/day: 0.10    Years: 10.00    Pack years: 1.00    Types: Cigars  . Smokeless tobacco: Never Used  . Tobacco comment: smokes 2 Black & Milds per day 08/19/13  Substance Use Topics  . Alcohol use: Yes    Comment: 1-2 times yearly  . Drug use: No     Allergies   Reglan [metoclopramide]; Lisinopril; Losartan; Sulfa antibiotics; Amoxicillin; and Contrast media [iodinated diagnostic agents]   Review of Systems Review of Systems  All other systems reviewed and are negative.    Physical Exam Updated Vital Signs BP (!) 146/104   Pulse 63   Temp 98.4 F (36.9 C) (Oral)   Resp 17   SpO2 100%   Physical Exam  Constitutional: She appears well-developed and well-nourished. No distress.  HENT:  Head: Normocephalic and atraumatic.  Right Ear: External ear normal.  Left Ear: External ear normal.  Eyes: Conjunctivae are normal. Right eye exhibits no discharge. Left eye exhibits no discharge. No scleral icterus.  Neck: Neck supple. No tracheal deviation present.  Cardiovascular: Normal rate, regular rhythm and intact distal pulses.  Pulmonary/Chest: Effort normal and breath sounds normal. No stridor. No respiratory distress. She has no wheezes. She has no rales.  Abdominal: Soft. Bowel sounds are normal. She exhibits no distension. There is no tenderness. There is no rebound and no guarding.  Musculoskeletal: She exhibits no edema or tenderness.  Neurological: She is alert. She has normal strength. No cranial nerve deficit (no facial droop, extraocular movements intact, no slurred  speech) or sensory deficit. She exhibits normal muscle tone. She displays no seizure activity. Coordination normal.  Skin: Skin is warm and dry. No rash noted.  Psychiatric: She has a normal mood and affect.  Nursing note and vitals reviewed.    ED Treatments / Results  Labs (all labs ordered are listed, but only abnormal results are displayed) Labs Reviewed  CBC - Abnormal; Notable for the following components:      Result Value   RBC 5.15 (*)    MCV 73.6 (*)    MCH 23.3 (*)    All other  components within normal limits  D-DIMER, QUANTITATIVE (NOT AT [facility]) - Abnormal; Notable for the following components:   D-Dimer, Quant 0.57 (*)    All other components within normal limits  BASIC METABOLIC PANEL  BRAIN NATRIURETIC PEPTIDE  I-STAT TROPONIN, ED  I-STAT BETA HCG BLOOD, ED ([facility], [facility], [facility] ONLY)  I-STAT TROPONIN, ED    EKG EKG Interpretation  Date/Time:  Monday December 11 2017 11:22:51 EDT Ventricular Rate:  83 PR Interval:  124 QRS Duration: 84 QT Interval:  378 QTC Calculation: 444 R Axis:   82 Text Interpretation:  Normal sinus rhythm Normal ECG Since last tracing rate slower Confirmed by Dorie Rank [phone]) on 12/11/2017 5:03:41 PM   Radiology Dg Chest 2 View  Result Date: 12/11/2017 CLINICAL DATA:  One-week history of progressive chest pain and shortness of breath. Pain worsens with exertion. Former smoker. Personal history of asthma, though the patient states she has not had an episode of asthma in 15+ years. EXAM: CHEST - 2 VIEW COMPARISON:  12/04/2017, 05/19/2016 and earlier, including CT chest 03/26/2013. FINDINGS: Cardiomediastinal silhouette unremarkable, unchanged. Lungs clear. Bronchovascular markings normal. Pulmonary vascularity normal. No visible pleural effusions. No pneumothorax. Very mild thoracic dextroscoliosis, unchanged. IMPRESSION: No acute cardiopulmonary disease.  Stable examination. Electronically Signed   By: Evangeline Dakin M.D.   On: 12/11/2017 11:52     Procedures Procedures (including critical care time)  Medications Ordered in ED Medications  morphine 4 MG/ML injection 4 mg (4 mg Intravenous Given 12/11/17 2105)  technetium TC 31M diethylenetriame-pentaacetic acid (DTPA) injection 32 millicurie (32 millicuries Inhalation Given 12/11/17 2345)  technetium albumin aggregated (MAA) injection solution 4.4 millicurie (4.4 millicuries Intravenous Contrast Given 12/12/17 0010)     Initial Impression / Assessment and Plan / ED Course  I have reviewed the triage vital signs and the nursing notes.  Pertinent labs & imaging results that were available during my care of the patient were reviewed by me and considered in my medical decision making (see chart for details).  Clinical Course as of Dec 12 28  Mon Dec 11, 2017  1840 D dimer barely above normal.   Previous d d imer was elevated    [JK]  Tue Dec 12, 2017  0029 Cardiac enzymes are normal   [JK]    Clinical Course User Index [JK] Dorie Rank, MD    She presented to the emergency room for evaluation of chest pain and shortness of breath.  Patient was seen by her primary doctor and sent to the ED for further evaluation.  Initial cardiac enzymes are normal.  Patient is overall low risk heart score.  Patient did have a slightly elevated d-dimer test.  VQ scan was ordered for further evaluation.  Dr. Roxanne Mins will follow up on the VQ scan.  If this does not suggest pulmonary embolism I think she can safely continue her outpatient cardiac evaluation.  Final Clinical Impressions(s) / ED Diagnoses   Final diagnoses:  Dyspnea, unspecified type  Chest pain, unspecified type    ED Discharge Orders    None       Dorie Rank, MD 12/12/17 0030

## 2017-12-11 NOTE — Telephone Encounter (Signed)
Pt called with having a history of asthma. Now having shortness of breath and wheezing. She feels like it has gotten worst. She has been using her daughter's nebulizer be Q2 hours. She coughs after she uses the machine and it is clear in color.  Because she can not afford the inhaler from [facility]. She can breath easier when she is on her left side. Denies having a fever or any swelling.  She will be using the nebulizer before her office visit, today. Appointment scheduled. Advised to call back if symptoms increased, pt voiced understanding. Will route to flow at [facility] [place]. Reason for Disposition . [1] MODERATE longstanding difficulty breathing (e.g., speaks in phrases, SOB even at rest, pulse 100-120) AND [2] SAME as normal  Answer Assessment - Initial Assessment Questions 1. RESPIRATORY STATUS: "Describe your breathing?" (e.g., wheezing, shortness of breath, unable to speak, severe coughing)      Shortness of breath, wheezing, cough 2. ONSET: "When did this breathing problem begin?"      Worse over the weekend 3. PATTERN "Does the difficult breathing come and go, or has it been constant since it started?"      constant 4. SEVERITY: "How bad is your breathing?" (e.g., mild, moderate, severe)    - MILD: No SOB at rest, mild SOB with walking, speaks normally in sentences, can lay down, no retractions, pulse < 100.    - MODERATE: SOB at rest, SOB with minimal exertion and prefers to sit, cannot lie down flat, speaks in phrases, mild retractions, audible wheezing, pulse 100-120.    - SEVERE: Very SOB at rest, speaks in single words, struggling to breathe, sitting hunched forward, retractions, pulse > 120      moderate 5. RECURRENT SYMPTOM: "Have you had difficulty breathing before?" If so, ask: "When was the last time?" and "What happened that time?"      Yes but not this bad and prescribed an inhaler 6. CARDIAC HISTORY: "Do you have any history of heart disease?" (e.g., heart  attack, angina, bypass surgery, angioplasty)      no 7. LUNG HISTORY: "Do you have any history of lung disease?"  (e.g., pulmonary embolus, asthma, emphysema)     asthma 8. CAUSE: "What do you think is causing the breathing problem?"      asthma 9. OTHER SYMPTOMS: "Do you have any other symptoms? (e.g., dizziness, runny nose, cough, chest pain, fever)     Weak, chest pain, cough  10. PREGNANCY: "Is there any chance you are pregnant?" "When was your last menstrual period?"       No, has had hysterectomy 11. TRAVEL: "Have you traveled out of the country in the last month?" (e.g., travel history, exposures)       no  Protocols used: BREATHING DIFFICULTY-A-AH

## 2017-12-11 NOTE — ED Notes (Signed)
Pt transported for VQ scan. 

## 2017-12-12 DIAGNOSIS — R0602 Shortness of breath: Secondary | ICD-10-CM | POA: Diagnosis not present

## 2017-12-12 MED ORDER — TECHNETIUM TO 99M ALBUMIN AGGREGATED
4.4000 | Freq: Once | INTRAVENOUS | Status: AC | PRN
  Administered 2017-12-12: 4.4 via INTRAVENOUS

## 2017-12-12 MED ORDER — IPRATROPIUM-ALBUTEROL 0.5-2.5 (3) MG/3ML IN SOLN
3.0000 mL | Freq: Once | RESPIRATORY_TRACT | Status: AC
  Administered 2017-12-12: 3 mL via RESPIRATORY_TRACT
  Filled 2017-12-12: qty 3

## 2017-12-12 MED ORDER — PREDNISONE 20 MG PO TABS
60.0000 mg | ORAL_TABLET | Freq: Once | ORAL | Status: AC
  Administered 2017-12-12: 60 mg via ORAL
  Filled 2017-12-12: qty 3

## 2017-12-12 MED ORDER — TECHNETIUM TC 99M DIETHYLENETRIAME-PENTAACETIC ACID
32.0000 | Freq: Once | INTRAVENOUS | Status: AC | PRN
  Administered 2017-12-11: 32 via RESPIRATORY_TRACT

## 2017-12-12 NOTE — ED Provider Notes (Signed)
Care assumed from Dr. Tomi Bamberger, patient presented with chest pain and dyspnea with elevated d-dimer.  Lung scan was pending.  Lung scan has come back showing no evidence of pulmonary embolism.  Patient complains of persistent chest pain and dyspnea.  At home, albuterol nebulizer treatment did give her some relief.  On exam, there are some harsh breath sounds with suggestion of slight wheezing which is more prevalent on the left.  She apparently did get a prescription for prednisone from her PCP, but has not had any.  She is given a dose of prednisone and will be given albuterol with ipratropium via nebulizer.  2:01 AM She feels considerably better after nebulizer treatment.  She states that she has sufficient albuterol for her home nebulizer, she is discharged with instructions to use her nebulizer every 4 hours as needed, take her prednisone as prescribed by PCP.  Follow-up with PCP in 2 days if not showing significant improvement.  Return if symptoms are worsening.   Delora Fuel, MD 12/75/17 [phone]

## 2017-12-12 NOTE — ED Notes (Signed)
Pt back in room from scan

## 2017-12-12 NOTE — ED Notes (Signed)
ED Provider at bedside. 

## 2017-12-12 NOTE — ED Notes (Signed)
Pt departed in NAD.  

## 2017-12-12 NOTE — Discharge Instructions (Addendum)
Continue using your nebulizer at home. Take the prednisone prescribed by your primary care doctor - take it as prescribed. Take ibuprofen, naproxen, or acetaminophen as needed for pain. Return if symptoms are getting worse.

## 2017-12-27 ENCOUNTER — Ambulatory Visit (INDEPENDENT_AMBULATORY_CARE_PROVIDER_SITE_OTHER): Payer: BLUE CROSS/BLUE SHIELD | Admitting: Cardiovascular Disease

## 2017-12-27 ENCOUNTER — Encounter: Payer: Self-pay | Admitting: Cardiovascular Disease

## 2017-12-27 VITALS — BP 154/97 | HR 84 | Ht 63.0 in | Wt 145.6 lb

## 2017-12-27 DIAGNOSIS — R0602 Shortness of breath: Secondary | ICD-10-CM

## 2017-12-27 DIAGNOSIS — I200 Unstable angina: Secondary | ICD-10-CM | POA: Diagnosis not present

## 2017-12-27 MED ORDER — HYDRALAZINE HCL 25 MG PO TABS: 25.0000 mg | ORAL_TABLET | Freq: Three times a day (TID) | ORAL | 3 refills | Status: DC

## 2017-12-27 NOTE — Progress Notes (Signed)
12/27/2017 GENESEE NASE   1975/09/23  [id number]  Primary Physician Briscoe Deutscher, DO Primary Cardiologist: Lorretta Harp MD Gar[facility], [place], [place]  HPI:  Suzanne [name] is a 42 y.o. married African-American female mother of one living child (one child deceased several months ago) who was referred by Dr. Juleen China for cardiovascular evaluation because of chest pain shortness of breath.  She works as a Administrator, sports.  She has smoked for the last 22 years.  She did have a normal Myoview and echo back in 2015 for similar symptoms.  She has reactive airways disease.  She does have treated hypertension and a family history of heart disease with a father who had bypass surgery.  She was recently seen in the ER 12/12/2017 with shortness of breath without a definitive diagnosis.  I d-dimer was mildly elevated but a lung scan was negative.   Current Meds  Medication Sig  . albuterol (PROVENTIL) (2.5 MG/3ML) 0.083% nebulizer solution Take 3 mLs (2.5 mg total) by nebulization every 4 (four) hours as needed for wheezing or shortness of breath.  Marland Kitchen amLODipine (NORVASC) 10 MG tablet Take 1 tablet (10 mg total) by mouth daily.  . hydrochlorothiazide (HYDRODIURIL) 25 MG tablet Take 1 tablet (25 mg total) by mouth daily.  . meloxicam (MOBIC) 15 MG tablet Take 1 tablet (15 mg total) by mouth daily.  . Multiple Vitamins-Calcium (ONE-A-DAY WOMENS FORMULA PO) Take 1 tablet by mouth daily.  Marland Kitchen omeprazole (PRILOSEC) 20 MG capsule Take 1 capsule (20 mg total) by mouth daily.  . predniSONE (DELTASONE) 20 MG tablet Take 2 tablets (40 mg total) by mouth daily with breakfast.  . [DISCONTINUED] cetirizine (ZYRTEC) 10 MG tablet Take 1 tablet (10 mg total) by mouth daily.  . [DISCONTINUED] fluticasone (FLOVENT HFA) 110 MCG/ACT inhaler Inhale 1 puff into the lungs 2 (two) times daily.     Allergies  Allergen Reactions  . Reglan [Metoclopramide] Shortness Of Breath  . Lisinopril Cough    . Losartan Cough  . Sulfa Antibiotics Nausea And Vomiting  . Amoxicillin Rash    Has patient had a PCN reaction causing immediate rash, facial/tongue/throat swelling, SOB or lightheadedness with hypotension: Yes Has patient had a PCN reaction causing severe rash involving mucus membranes or skin necrosis: Broke out face & buttocks Has patient had a PCN reaction that required hospitalization: No Has patient had a PCN reaction occurring within the last 10 years: No If all of the above answers are "NO", then may proceed with Cephalosporin use  . Contrast Media [Iodinated Diagnostic Agents] Rash    Social History   Socioeconomic History  . Marital status: Married    Spouse name: Suzanne [name]  . Number of children: 2  . Years of education: College  . Highest education level: Not on file  Occupational History  . Occupation: food Public house manager: OTHER    Comment: [employer]  . Financial resource strain: Not on file  . Food insecurity:    Worry: Not on file    Inability: Not on file  . Transportation needs:    Medical: Not on file    Non-medical: Not on file  Tobacco Use  . Smoking status: Current Some Day Smoker    Packs/day: 0.10    Years: 10.00    Pack years: 1.00    Types: Cigars  . Smokeless tobacco: Never Used  . Tobacco comment: smokes 2 Black & Milds  per day 08/19/13  Substance and Sexual Activity  . Alcohol use: Yes    Comment: 1-2 times yearly  . Drug use: No  . Sexual activity: Yes    Birth control/protection: Other-see comments, Surgical  Lifestyle  . Physical activity:    Days per week: Not on file    Minutes per session: Not on file  . Stress: Not on file  Relationships  . Social connections:    Talks on phone: Not on file    Gets together: Not on file    Attends religious service: Not on file    Active member of club or organization: Not on file    Attends meetings of clubs or organizations: Not on file    Relationship status:  Not on file  . Intimate partner violence:    Fear of current or ex partner: Not on file    Emotionally abused: Not on file    Physically abused: Not on file    Forced sexual activity: Not on file  Other Topics Concern  . Not on file  Social History Narrative   Patient lives at home with family.   Patient goes to [employer].   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     Review of Systems: General: negative for chills, fever, night sweats or weight changes.  Cardiovascular: negative for chest pain, dyspnea on exertion, edema, orthopnea, palpitations, paroxysmal nocturnal dyspnea or shortness of breath Dermatological: negative for rash Respiratory: negative for cough or wheezing Urologic: negative for hematuria Abdominal: negative for nausea, vomiting, diarrhea, bright red blood per rectum, melena, or hematemesis Neurologic: negative for visual changes, syncope, or dizziness All other systems reviewed and are otherwise negative except as noted above.    Blood pressure (!) 154/97, pulse 84, height 5\' 3"  (1.6 m), weight 145 lb 9.6 oz (66 kg).  General appearance: alert and no distress Neck: no adenopathy, no carotid bruit, no JVD, supple, symmetrical, trachea midline and thyroid not enlarged, symmetric, no tenderness/mass/nodules Lungs: clear to auscultation bilaterally Heart: regular rate and rhythm, S1, S2 normal, no murmur, click, rub or gallop Extremities: extremities normal, atraumatic, no cyanosis or edema Pulses: 2+ and symmetric Skin: Skin color, texture, turgor normal. No rashes or lesions Neurologic: Alert and oriented X 3, normal strength and tone. Normal symmetric reflexes. Normal coordination and gait  EKG not performed today  ASSESSMENT AND PLAN:   Essential hypertension She of essential hypertension her blood pressure measured today at 154/97.  She is on amlodipine.  She is is under a lot of stress.  I  am going to start her low-dose lisinopril will check a basic metabolic panel in 2 weeks.  Atypical chest pain History of atypical chest pain with negative Myoview in 2015 and ongoing symptoms now occurring on a daily basis.  This does not sound ischemic in nature.  I am going to get a coronary CTA to definitively rule out an ischemic etiology.  Shortness of breath History of shortness of breath with reactive airways disease no normal echo back in 2015.      Lorretta Harp MD FACP,FACC,FAHA, [facility] 12/27/2017 4:12 PM

## 2017-12-27 NOTE — Assessment & Plan Note (Signed)
History of atypical chest pain with negative Myoview in 2015 and ongoing symptoms now occurring on a daily basis.  This does not sound ischemic in nature.  I am going to get a coronary CTA to definitively rule out an ischemic etiology.

## 2017-12-27 NOTE — Assessment & Plan Note (Signed)
She of essential hypertension her blood pressure measured today at 154/97.  She is on amlodipine.  She is is under a lot of stress.  I am going to start her low-dose lisinopril will check a basic metabolic panel in 2 weeks.

## 2017-12-27 NOTE — Assessment & Plan Note (Signed)
History of shortness of breath with reactive airways disease no normal echo back in 2015.

## 2017-12-27 NOTE — Patient Instructions (Signed)
Medication Instructions:  Your physician has recommended you make the following change in your medication:  1) START Hydralazine 25 mg tablet by mouth THREE times daily   Labwork: Your physician recommends that you return for lab work in: 3 weeks - BMET   Testing/Procedures: Your physician has requested that you have a lexiscan myoview. For further information please visit [email or web address]. Please follow instruction sheet, as given.   Follow-Up: Your physician recommends that you schedule a follow-up appointment in: 1 month [facility]  We request that you follow-up in: 3 months with an extender and in 6 months with Dr Andria Rhein will receive a reminder letter in the mail two months in advance. If you don't receive a letter, please call our office to schedule the follow-up appointment.     Any Other Special Instructions Will Be Listed Below (If Applicable).     If you need a refill on your cardiac medications before your next appointment, please call your pharmacy.

## 2018-01-01 ENCOUNTER — Encounter: Payer: Self-pay | Admitting: Neurology

## 2018-01-01 ENCOUNTER — Ambulatory Visit (INDEPENDENT_AMBULATORY_CARE_PROVIDER_SITE_OTHER): Payer: BLUE CROSS/BLUE SHIELD | Admitting: Neurology

## 2018-01-01 VITALS — BP 120/88 | Resp 16 | Ht 63.0 in | Wt 143.1 lb

## 2018-01-01 DIAGNOSIS — M542 Cervicalgia: Secondary | ICD-10-CM

## 2018-01-01 DIAGNOSIS — R202 Paresthesia of skin: Secondary | ICD-10-CM

## 2018-01-01 MED ORDER — TIZANIDINE HCL 2 MG PO CAPS: 2.0000 mg | ORAL_CAPSULE | Freq: Two times a day (BID) | ORAL | 2 refills | Status: DC | PRN

## 2018-01-01 NOTE — Progress Notes (Signed)
Follow-up Visit   Date: 01/01/18    Suzanne [name] MRN: [id number] DOB: [birth year]   Interim History: Suzanne [name] is a 42 y.o. right-handed African American female with hypertension, GERD, tobacco user, and chronic low back pain returning to the clinic with left arm pain and weakness.  The patient was accompanied to the clinic by self.  History of present illness: Initial visit for falls and back pain 12/24/2015:  She moved from [place] in 2011 and about a year later, she recalls having her first fall.  Since this time, she continues to have unprocoked falls about twice per year.  Initially, she attributed it to slipping on the floor or missteping, but since symptoms have increased, she was referred for further evaluation.  Her falls have occurred as follows: (1) first fall occurred while getting into the tub and her legs buckled, she had to sit for a while and then was able to stand, (2) she was pushing a cart at work and again suddenly her legs gave out, no associated pain (3) fell in the kitchen (4) another time she fell while getting out of the shower during which time she did have back pain, (5) in March 2017, she was standing on the porch talking to her mother and suddenly collapsed because her legs again buckled.   There was no associated back or leg pain.  She has occasion tingling of the toes on the left.  She is able to move her legs immediately following a falls and usually takes about 10-minutes for her to stand up again, which is due to pain.   She has chronic neck and back pain and has seen Pain Management for [facility] and tried physical therapy.    UPDATE 08/12/2016:  She returns for follow-up visit and complains of whole body pain, worse neck, hands, and legs.  Pain is sharp down her legs.  She has tingling in her fingers. Her cervical and lumbar imaging does not disclose nerve impingement.  She has a lot of tenderness over the muscles and has been diagnosed with fibromyalgia in  the past.  She tried several medications and saw pain management, but is not taking any medications for fibromyalgia currently.  She had lost insurance a few months ago, and was unable to continue her vitamin B12 injections.  She is also being evaluated for unintentional weight loss and has a colonoscopy coming up.  UPDATE 01/01/2018:  She is here for follow-up visit with similar complains of left sided numbness/tingling, especially involving the arm.  She has difficulty holding objects.  She initially thought it was due to working at a computer a lot.  Flexeril and tylenol provided some relief, however, she feels that she is having to take more of it to get relief.  NCS/EMG of the left side in 2018 was normal.  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  She tragically lost her son in a house fire in April and is grieving his loss.    Medications:  Current Outpatient Medications on File Prior to Visit  Medication Sig Dispense Refill  . albuterol (PROVENTIL) (2.5 MG/3ML) 0.083% nebulizer solution Take 3 mLs (2.5 mg total) by nebulization every 4 (four) hours as needed for wheezing or shortness of breath. 150 mL 1  . amLODipine (NORVASC) 10 MG tablet Take 1 tablet (10 mg total) by mouth daily. 90 tablet 2  . hydrALAZINE (APRESOLINE) 25 MG  tablet Take 1 tablet (25 mg total) by mouth 3 (three) times daily. 180 tablet 3  . hydrochlorothiazide (HYDRODIURIL) 25 MG tablet Take 1 tablet (25 mg total) by mouth daily. 90 tablet 1  . meloxicam (MOBIC) 15 MG tablet Take 1 tablet (15 mg total) by mouth daily. 30 tablet 0  . Multiple Vitamins-Calcium (ONE-A-DAY WOMENS FORMULA PO) Take 1 tablet by mouth daily.    Marland Kitchen omeprazole (PRILOSEC) 20 MG capsule Take 1 capsule (20 mg total) by mouth daily. 30 capsule 3  . predniSONE (DELTASONE) 20 MG tablet Take 2 tablets (40 mg total) by mouth daily with breakfast. 10 tablet 0   No current facility-administered  medications on file prior to visit.     Allergies:  Allergies  Allergen Reactions  . Reglan [Metoclopramide] Shortness Of Breath  . Lisinopril Cough  . Losartan Cough  . Sulfa Antibiotics Nausea And Vomiting  . Amoxicillin Rash       . Contrast Media [Iodinated Diagnostic Agents] Rash    Review of Systems:  CONSTITUTIONAL: No fevers, chills, night sweats, +weight loss.  EYES: No visual changes or eye pain ENT: No hearing changes.  No history of nose bleeds.   RESPIRATORY: No cough, wheezing and shortness of breath.   CARDIOVASCULAR: Negative for chest pain, and palpitations.   GI: Negative for abdominal discomfort, blood in stools or black stools.  No recent change in bowel habits.   GU:  No history of incontinence.   MUSCLOSKELETAL: No history of joint pain or swelling.  +myalgias.   SKIN: Negative for lesions, rash, and itching.   ENDOCRINE: Negative for cold or heat intolerance, polydipsia or goiter.   PSYCH:  No depression or anxiety symptoms.   NEURO: As Above.   Vital Signs:  BP 120/88   Resp 16   Ht '5\' 3"'$  (1.6 m)   Wt 143 lb 1 oz (64.9 kg)   BMI 25.34 kg/m   General Medical Exam:   General:  Well appearing, comfortable  Eyes/ENT: see cranial nerve examination.   Neck: No masses appreciated.  Full range of motion without tenderness.  No carotid bruits. Respiratory:  Clear to auscultation, good air entry bilaterally.   Cardiac:  Regular rate and rhythm, no murmur.   Ext:  No edema.    Neurological Exam: MENTAL STATUS including orientation to time, place, person, recent and remote memory, attention span and concentration, language, and fund of knowledge is normal.  Speech is not dysarthric.  CRANIAL NERVES: No visual field defects. Pupils equal round and reactive to light.  Normal conjugate, extra-ocular eye movements in all directions of gaze.  No ptosis. Face is symmetric. Palate elevates symmetrically.  Tongue is midline.  MOTOR:  Motor strength is 5/5 in  all extremities  No atrophy, fasciculations or abnormal movements.  No pronator drift.  Tone is normal.  She has localized tenderness over the cervical and shoulder muscles to palpation  MSRs:  Reflexes are 2+/4 throughout.  SENSORY:  Intact to vibration, temperature, and pin prick.  Prayer testing negative.  Negative Tinel's sign at the wrist.  COORDINATION/GAIT:    Gait narrow based and stable.    Data: Labs 12/24/2015:  Vitamin B12 205*, TSH 0.84, CRP 0.3, ANA neg, ESR 25, folate 9.3  MRI cervical spine wwo contrast 02/20/2016:   1. Small central disc extrusion at C5-6 with mild spinal stenosis and mild spinal cord mass effect. 2. Mild bilateral foraminal stenosis and borderline spinal stenosis at C4-5. 3. Mild-to-moderate left foraminal stenosis  at C3-4 due to uncovertebral disease.  MRI brain wo contrast 06/14/2011:  Normal  MRI cervical spine wo contrast 06/14/2011:  Mild posterior central disc protrusions at C4-5 and C5-6.  No spinal or foraminal stenosis. MRI lumbar spine 06/14/2011:  L4-5 disc bulge and facet hypertrophy with moderate biforaminal stenosis; L5-S1 disc bulge with no spinal or foraminal stenosis.  NCS/EMG of the left side 09/11/2016: This is a normal study of the left side.  In particular, there is no evidence of a generalized sensorimotor polyneuropathy, diffuse myopathy, cervical/lumbosacral radiculopathy, or carpal tunnel syndrome affecting the left side.  IMPRESSION: 1.  Cervicalgia with localized muscle tenderness, likely contributed to recent life stressors (tragic loss of her son).  Start neck physiotherapy.  Start tizanidine '2mg'$  twice daily as needed.  I also urged her to consider seeking grief counseling.     2.  Left arm paresthesias, possible due to temporary nerve compression from positioning (hyperflexion of wrist/elbow). Her NCS/EMG in 2018 was normal and did show nerve pathology.  Recommend treating conservatively with using wrist splint and sleeping with  arms extended.  If symptoms do not improve, repeat EDX can be performed.   Thank you for allowing me to participate in patient's care.  If I can answer any additional questions, I would be pleased to do so.    Sincerely,    Raini Tiley K. Posey Pronto, DO

## 2018-01-01 NOTE — Patient Instructions (Signed)
Start physical therapy  Start using a wrist splint at night time and try to sleep with arms extended, avoid flexed position  If symptoms do not improve in 4-6 months, we can repeat nerve testing

## 2018-01-30 ENCOUNTER — Telehealth (HOSPITAL_COMMUNITY): Payer: Self-pay

## 2018-01-30 NOTE — Telephone Encounter (Signed)
Encounter complete. 

## 2018-02-01 ENCOUNTER — Ambulatory Visit (HOSPITAL_COMMUNITY)
Admission: RE | Admit: 2018-02-01 | Discharge: 2018-02-01 | Disposition: A | Discharge status: Home / Self Care | Payer: BLUE CROSS/BLUE SHIELD | Source: Ambulatory Visit | Attending: Cardiovascular Disease | Admitting: Cardiovascular Disease

## 2018-02-01 ENCOUNTER — Ambulatory Visit (INDEPENDENT_AMBULATORY_CARE_PROVIDER_SITE_OTHER): Payer: BLUE CROSS/BLUE SHIELD | Admitting: Pharmacist

## 2018-02-01 VITALS — BP 200/96 | HR 68

## 2018-02-01 DIAGNOSIS — R0602 Shortness of breath: Secondary | ICD-10-CM | POA: Diagnosis not present

## 2018-02-01 DIAGNOSIS — I10 Essential (primary) hypertension: Secondary | ICD-10-CM | POA: Diagnosis not present

## 2018-02-01 DIAGNOSIS — I200 Unstable angina: Secondary | ICD-10-CM

## 2018-02-01 LAB — MYOCARDIAL PERFUSION IMAGING
LV dias vol: 105 mL (ref 46–106)
LV sys vol: 44 mL
Peak HR: 121 {beats}/min
Rest HR: 62 {beats}/min
SDS: 2
SRS: 0
SSS: 2
TID: 1.12

## 2018-02-01 MED ORDER — REGADENOSON 0.4 MG/5ML IV SOLN
0.4000 mg | Freq: Once | INTRAVENOUS | Status: AC
  Administered 2018-02-01: 0.4 mg via INTRAVENOUS

## 2018-02-01 MED ORDER — TECHNETIUM TC 99M TETROFOSMIN IV KIT
26.7000 | PACK | Freq: Once | INTRAVENOUS | Status: AC | PRN
  Administered 2018-02-01: 26.7 via INTRAVENOUS
  Filled 2018-02-01: qty 27

## 2018-02-01 MED ORDER — AMINOPHYLLINE 25 MG/ML IV SOLN
75.0000 mg | Freq: Once | INTRAVENOUS | Status: AC
  Administered 2018-02-01: 75 mg via INTRAVENOUS

## 2018-02-01 MED ORDER — TECHNETIUM TC 99M TETROFOSMIN IV KIT
7.4000 | PACK | Freq: Once | INTRAVENOUS | Status: AC | PRN
  Administered 2018-02-01: 7.4 via INTRAVENOUS
  Filled 2018-02-01: qty 8

## 2018-02-01 MED ORDER — HYDRALAZINE HCL 25 MG PO TABS: 12.5000 mg | ORAL_TABLET | Freq: Three times a day (TID) | ORAL | 3 refills | Status: DC

## 2018-02-01 NOTE — Patient Instructions (Addendum)
Return for a a follow up appointment in 2 weeks  Check your blood pressure at home daily (if able) and keep record of the readings.  Take your BP meds as follows: *DECRASE Hydralazine dose to 12.5mg  (1/2 tab) three times daily*  Bring all of your meds, your BP cuff and your record of home blood pressures to your next appointment.  Exercise as you're able, try to walk approximately 30 minutes per day.  Keep salt intake to a minimum, especially watch canned and prepared boxed foods.  Eat more fresh fruits and vegetables and fewer canned items.  Avoid eating in fast food restaurants.    HOW TO TAKE YOUR BLOOD PRESSURE: . Rest 5 minutes before taking your blood pressure. .  Don't smoke or drink caffeinated beverages for at least 30 minutes before. . Take your blood pressure before (not after) you eat. . Sit comfortably with your back supported and both feet on the floor (don't cross your legs). . Elevate your arm to heart level on a table or a desk. . Use the proper sized cuff. It should fit smoothly and snugly around your bare upper arm. There should be enough room to slip a fingertip under the cuff. The bottom edge of the cuff should be 1 inch above the crease of the elbow. . Ideally, take 3 measurements at one sitting and record the average.

## 2018-02-01 NOTE — Assessment & Plan Note (Addendum)
Blood pressure extremity elevated and patient asymptomatic. She held all BP medication this morning and just completed Cardiac Stress test. DOD aware of current BP readings as well. Will resume all her home BP medication today as soon as she gets back home.   Will decrease hydralazine to 12.5mg  (1/2 tablet) three times a day. Patient instructed to take Hydralazine every 8 hours with 1st dose in AM. Plan follow up in 2 weeks, then change HCTZ to chlorthalidone if additional BP control needed.

## 2018-02-01 NOTE — Progress Notes (Signed)
Patient ID: Suzanne [name]                 DOB: Jan 20, 1976                      MRN: [id number]     HPI: Suzanne [name] is a 42 y.o. female referred by Dr. Gwenlyn Found to HTN clinic. PMH includes chest pain, SOB, tobacco abuse, hypertension, asthma, depression, and migraines.  Patient presents for initial HTN evaluation right after completing stress test this morning. She did not have any of her medication this morning either. She is currently asymptomatic but reports occasional chest pain and breathing problems. Patient is currently under assessment with cardiologist for these problems.  She is using her NEB treatments for at night and quit smoking 45 days ago. Repost dizziness with hydralazine 25mg  TID; therefore she decided to take all 3 doses after work (from3pm to mid-night)  Current HTN meds:  Amlodipine 10mg  daily Hydralazine 25mg  three times daily (taking all doses between 3pm and mid-night) HCTZ 25mg  daily  Intolerance:  Lisinopril - cough Losartan - cough  BP goal: 130/80  Family History:   Social History: current smoker, occasional alcohol  Diet: poor appetite, >40lb pf unintentional weight loss in 8 months  Exercise: activities of daily living  Home BP readings: none avialable  Wt Readings from Last 3 Encounters:  02/01/18 143 lb (64.9 kg)  01/01/18 143 lb 1 oz (64.9 kg)  12/27/17 145 lb 9.6 oz (66 kg)   BP Readings from Last 3 Encounters:  02/01/18 (!) 200/96  01/01/18 120/88  12/27/17 (!) 154/97   Pulse Readings from Last 3 Encounters:  02/01/18 68  12/27/17 84  12/11/17 63    Past Medical History:  Diagnosis Date  . Allergy   . Anemia   . Anxiety   . Arthritis   . Asthma   . Chest pain   . Chronic lower back pain   . Depression   . GERD (gastroesophageal reflux disease)   . Hypertension   . Migraine   . Neck pain, chronic   . Shortness of breath     Current Outpatient Medications on File Prior to Visit  Medication Sig Dispense Refill  . albuterol  (PROVENTIL) (2.5 MG/3ML) 0.083% nebulizer solution Take 3 mLs (2.5 mg total) by nebulization every 4 (four) hours as needed for wheezing or shortness of breath. 150 mL 1  . amLODipine (NORVASC) 10 MG tablet Take 1 tablet (10 mg total) by mouth daily. 90 tablet 2  . hydrochlorothiazide (HYDRODIURIL) 25 MG tablet Take 1 tablet (25 mg total) by mouth daily. 90 tablet 1  . meloxicam (MOBIC) 15 MG tablet Take 1 tablet (15 mg total) by mouth daily. 30 tablet 0  . Multiple Vitamins-Calcium (ONE-A-DAY WOMENS FORMULA PO) Take 1 tablet by mouth daily.    Marland Kitchen omeprazole (PRILOSEC) 20 MG capsule Take 1 capsule (20 mg total) by mouth daily. 30 capsule 3  . tizanidine (ZANAFLEX) 2 MG capsule Take 1 capsule (2 mg total) by mouth 2 (two) times daily as needed for muscle spasms. 60 capsule 2   No current facility-administered medications on file prior to visit.     Allergies  Allergen Reactions  . Reglan [Metoclopramide] Shortness Of Breath  . Lisinopril Cough  . Losartan Cough  . Sulfa Antibiotics Nausea And Vomiting  . Amoxicillin Rash    Has patient had a PCN reaction causing immediate rash, facial/tongue/throat swelling, SOB or lightheadedness with hypotension:  Yes Has patient had a PCN reaction causing severe rash involving mucus membranes or skin necrosis: Broke out face & buttocks Has patient had a PCN reaction that required hospitalization: No Has patient had a PCN reaction occurring within the last 10 years: No If all of the above answers are "NO", then may proceed with Cephalosporin use  . Contrast Media [Iodinated Diagnostic Agents] Rash    Blood pressure (!) 200/96, pulse 68.  Essential hypertension Blood pressure extremity elevated and patient asymptomatic. She held all BP medication this morning and just completed Cardiac Stress test. DOD aware of current BP readings as well. Will resume all her home BP medication today as soon as she gets back home.   Will decrease hydralazine to 12.5mg   (1/2 tablet) three times a day. Patient instructed to take Hydralazine every 8 hours with 1st dose in AM. Plan follow up in 2 weeks, then change HCTZ to chlorthalidone if additional BP control needed.   Maryln Eastham Rodriguez-Guzman PharmD, BCPS, [place] [facility] [address] [place] [address] 02/01/2018 12:24 PM

## 2018-02-12 ENCOUNTER — Ambulatory Visit (INDEPENDENT_AMBULATORY_CARE_PROVIDER_SITE_OTHER): Payer: BLUE CROSS/BLUE SHIELD | Admitting: Pharmacist

## 2018-02-12 ENCOUNTER — Encounter: Payer: Self-pay | Admitting: Pharmacist

## 2018-02-12 VITALS — BP 150/82 | HR 74 | Ht 63.0 in | Wt 146.6 lb

## 2018-02-12 DIAGNOSIS — I10 Essential (primary) hypertension: Secondary | ICD-10-CM

## 2018-02-12 MED ORDER — CARVEDILOL 3.125 MG PO TABS: ORAL_TABLET | ORAL | 0 refills | Status: DC

## 2018-02-12 NOTE — Patient Instructions (Addendum)
Return for a  follow up appointment in 6 weeks (with Kerin Ransom)  Check your blood pressure at home daily (if able) and keep record of the readings.  Take your BP meds as follows: *START carvedilol 3.125mg  every evening for 1 week, then increase to 3.125mg  twice daily*  Bring all of your meds, your BP cuff and your record of home blood pressures to your next appointment.  Exercise as you're able, try to walk approximately 30 minutes per day.  Keep salt intake to a minimum, especially watch canned and prepared boxed foods.  Eat more fresh fruits and vegetables and fewer canned items.  Avoid eating in fast food restaurants.    HOW TO TAKE YOUR BLOOD PRESSURE: . Rest 5 minutes before taking your blood pressure. .  Don't smoke or drink caffeinated beverages for at least 30 minutes before. . Take your blood pressure before (not after) you eat. . Sit comfortably with your back supported and both feet on the floor (don't cross your legs). . Elevate your arm to heart level on a table or a desk. . Use the proper sized cuff. It should fit smoothly and snugly around your bare upper arm. There should be enough room to slip a fingertip under the cuff. The bottom edge of the cuff should be 1 inch above the crease of the elbow. . Ideally, take 3 measurements at one sitting and record the average.

## 2018-02-12 NOTE — Progress Notes (Signed)
Patient ID: [name]                 DOB: 1975-12-02                      MRN: [id number]     HPI: Suzanne [name] is a 42 y.o. female referred by Dr. Gwenlyn Found to HTN clinic. PMH includes chest pain, SOB, tobacco abuse, hypertension, asthma, depression, and migraines.  Patient presents for initial HTN evaluation right after completing stress test this morning. She did not have any of her medication this morning either. She is currently asymptomatic but reports occasional chest pain and breathing problems. Notes some palpitations and dizziness. This resolves when she rests for a while. Patient is currently under assessment with cardiologist for these problems.   Patient is now taking hydralazine 12.5 TID due to dizziness with 25 mg TID.  Current HTN meds:  Amlodipine 10mg  daily Hydralazine 12.5mg  three times daily HCTZ 25mg  daily  Intolerance:  Lisinopril - cough Losartan - cough  BP goal: 130/80  Family History:   Social History: current smoker, occasional alcohol  Diet: continues to have poor appetite, >40lb pf unintentional weight loss in 8 months  Exercise: activities of daily living  Home BP readings: Patient reports SBP in 150s  Wt Readings from Last 3 Encounters:  02/12/18 146 lb 9.6 oz (66.5 kg)  02/01/18 143 lb (64.9 kg)  01/01/18 143 lb 1 oz (64.9 kg)   BP Readings from Last 3 Encounters:  02/12/18 (!) 150/82  02/01/18 (!) 200/96  01/01/18 120/88   Pulse Readings from Last 3 Encounters:  02/12/18 74  02/01/18 68  12/27/17 84    Past Medical History:  Diagnosis Date  . Allergy   . Anemia   . Anxiety   . Arthritis   . Asthma   . Chest pain   . Chronic lower back pain   . Depression   . GERD (gastroesophageal reflux disease)   . Hypertension   . Migraine   . Neck pain, chronic   . Shortness of breath     Current Outpatient Medications on File Prior to Visit  Medication Sig Dispense Refill  . albuterol (PROVENTIL) (2.5 MG/3ML) 0.083% nebulizer  solution Take 3 mLs (2.5 mg total) by nebulization every 4 (four) hours as needed for wheezing or shortness of breath. 150 mL 1  . amLODipine (NORVASC) 10 MG tablet Take 1 tablet (10 mg total) by mouth daily. 90 tablet 2  . hydrALAZINE (APRESOLINE) 25 MG tablet Take 0.5 tablets (12.5 mg total) by mouth 3 (three) times daily. 180 tablet 3  . hydrochlorothiazide (HYDRODIURIL) 25 MG tablet Take 1 tablet (25 mg total) by mouth daily. 90 tablet 1  . meloxicam (MOBIC) 15 MG tablet Take 1 tablet (15 mg total) by mouth daily. 30 tablet 0  . Multiple Vitamins-Calcium (ONE-A-DAY WOMENS FORMULA PO) Take 1 tablet by mouth daily.    Marland Kitchen omeprazole (PRILOSEC) 20 MG capsule Take 1 capsule (20 mg total) by mouth daily. 30 capsule 3  . tizanidine (ZANAFLEX) 2 MG capsule Take 1 capsule (2 mg total) by mouth 2 (two) times daily as needed for muscle spasms. 60 capsule 2   No current facility-administered medications on file prior to visit.     Allergies  Allergen Reactions  . Reglan [Metoclopramide] Shortness Of Breath  . Lisinopril Cough  . Losartan Cough  . Sulfa Antibiotics Nausea And Vomiting  . Amoxicillin Rash    Has  patient had a PCN reaction causing immediate rash, facial/tongue/throat swelling, SOB or lightheadedness with hypotension: Yes Has patient had a PCN reaction causing severe rash involving mucus membranes or skin necrosis: Broke out face & buttocks Has patient had a PCN reaction that required hospitalization: No Has patient had a PCN reaction occurring within the last 10 years: No If all of the above answers are "NO", then may proceed with Cephalosporin use  . Contrast Media [Iodinated Diagnostic Agents] Rash    Blood pressure (!) 150/82, pulse 74, height 5\' 3"  (1.6 m), weight 146 lb 9.6 oz (66.5 kg).  Essential hypertension Patient presents today with reports of improved BP control since last office visit. She states that her SBP has been in the 150s lately which is consistent with our  office reading of 150/82. She states that she is having palpitations and episodes of chest tightness. Carvedilol will be initiated for BP lowering as well as control of palpitations. Her HR should be able to tolerate carvedilol and patient was instructed to call if she experienced bradycardia. Patient will begin carvedilol once every evening and titrate to BID in one week. Patient was counseled on exercise and low salt diet. She is encouraged by her progress so far and will follow up in 6 weeks with PA.  Suzanne [name] PharmD Candidate 02/12/2018 4:19 PM   Preset during visit. Assessment and plan discussed and approved Leeza Heiner Rodriguez-Guzman PharmD, BCPS, [place] [address] 02/12/2018 4:20 PM

## 2018-02-12 NOTE — Assessment & Plan Note (Signed)
Patient presents today with reports of improved BP control since last office visit. She states that her SBP has been in the 150s lately which is consistent with our office reading of 150/82. She states that she is having palpitations and episodes of chest tightness. Carvedilol will be initiated for BP lowering as well as control of palpitations. Her HR should be able to tolerate carvedilol and patient was instructed to call if she experienced bradycardia. Patient will begin carvedilol once every evening and titrate to BID in one week. Patient was counseled on exercise and low salt diet. She is encouraged by her progress so far and will follow up in 6 weeks with PA.

## 2018-03-16 ENCOUNTER — Telehealth: Payer: Self-pay | Admitting: *Deleted

## 2018-03-16 NOTE — Telephone Encounter (Signed)
Received notification that patient did not show for her PT on 02-01-18 and has not called [facility] back.

## 2018-03-26 ENCOUNTER — Encounter: Payer: Self-pay | Admitting: Cardiology

## 2018-03-26 ENCOUNTER — Ambulatory Visit (INDEPENDENT_AMBULATORY_CARE_PROVIDER_SITE_OTHER): Payer: BLUE CROSS/BLUE SHIELD | Admitting: Cardiology

## 2018-03-26 VITALS — BP 159/102 | HR 59 | Ht 64.0 in | Wt 149.0 lb

## 2018-03-26 DIAGNOSIS — R011 Cardiac murmur, unspecified: Secondary | ICD-10-CM

## 2018-03-26 DIAGNOSIS — F17200 Nicotine dependence, unspecified, uncomplicated: Secondary | ICD-10-CM | POA: Diagnosis not present

## 2018-03-26 DIAGNOSIS — I10 Essential (primary) hypertension: Secondary | ICD-10-CM

## 2018-03-26 DIAGNOSIS — Z87891 Personal history of nicotine dependence: Secondary | ICD-10-CM | POA: Insufficient documentation

## 2018-03-26 DIAGNOSIS — R0789 Other chest pain: Secondary | ICD-10-CM | POA: Diagnosis not present

## 2018-03-26 MED ORDER — HYDRALAZINE HCL 25 MG PO TABS: 25.0000 mg | ORAL_TABLET | Freq: Two times a day (BID) | ORAL | 3 refills | Status: DC

## 2018-03-26 NOTE — Assessment & Plan Note (Signed)
Check echo- f/u on LVH seen in 2015

## 2018-03-26 NOTE — Progress Notes (Addendum)
03/26/2018 Suzanne [name]   June 28, 1975  [id number]  Primary Physician Briscoe Deutscher, DO Primary Cardiologist: Dr Gwenlyn Found  HPI:  Pt is a pleasant 42 y/o AA female, who works Therapist, art at [employer], followed by Dr Gwenlyn Found with a history of past chest pain and difficult to control HTN. She has had prior low risk Myoview Sept 2019.  An echo in 2015 showed mild LVH and grade 1 DD. She is intolerant to both lisinopril and losartan (cough).  She is being followed in our HTN clinic. She is in the office today for a 6 month follow up. She is doing well as far as her chest pain goes. Her main complaint is pain in her neck and headaches.    Current Outpatient Medications  Medication Sig Dispense Refill  . albuterol (PROVENTIL) (2.5 MG/3ML) 0.083% nebulizer solution Take 3 mLs (2.5 mg total) by nebulization every 4 (four) hours as needed for wheezing or shortness of breath. 150 mL 1  . amLODipine (NORVASC) 10 MG tablet Take 1 tablet (10 mg total) by mouth daily. 90 tablet 2  . carvedilol (COREG) 3.125 MG tablet Take 3.125mg  every evening for 1 week, then increase to 3.125mg  twice daily 60 tablet 0  . hydrALAZINE (APRESOLINE) 25 MG tablet Take 1 tablet (25 mg total) by mouth 2 (two) times daily. 180 tablet 3  . hydrochlorothiazide (HYDRODIURIL) 25 MG tablet Take 1 tablet (25 mg total) by mouth daily. 90 tablet 1  . meloxicam (MOBIC) 15 MG tablet Take 1 tablet (15 mg total) by mouth daily. 30 tablet 0  . Multiple Vitamins-Calcium (ONE-A-DAY WOMENS FORMULA PO) Take 1 tablet by mouth daily.    Marland Kitchen omeprazole (PRILOSEC) 20 MG capsule Take 1 capsule (20 mg total) by mouth daily. 30 capsule 3  . tizanidine (ZANAFLEX) 2 MG capsule Take 1 capsule (2 mg total) by mouth 2 (two) times daily as needed for muscle spasms. 60 capsule 2   No current facility-administered medications for this visit.     Allergies  Allergen Reactions  . Reglan [Metoclopramide] Shortness Of Breath  . Lisinopril Cough  . Losartan  Cough  . Sulfa Antibiotics Nausea And Vomiting  . Amoxicillin Rash    Has patient had a PCN reaction causing immediate rash, facial/tongue/throat swelling, SOB or lightheadedness with hypotension: Yes Has patient had a PCN reaction causing severe rash involving mucus membranes or skin necrosis: Broke out face & buttocks Has patient had a PCN reaction that required hospitalization: No Has patient had a PCN reaction occurring within the last 10 years: No If all of the above answers are "NO", then may proceed with Cephalosporin use  . Contrast Media [Iodinated Diagnostic Agents] Rash    Past Medical History:  Diagnosis Date  . Allergy   . Anemia   . Anxiety   . Arthritis   . Asthma   . Chest pain   . Chronic lower back pain   . Depression   . GERD (gastroesophageal reflux disease)   . Hypertension   . Migraine   . Neck pain, chronic   . Shortness of breath     Social History   Socioeconomic History  . Marital status: Married    Spouse name: [name]  . Number of children: 2  . Years of education: College  . Highest education level: Not on file  Occupational History  . Occupation: food Public house manager: OTHER    Comment: [place]  .  Financial resource strain: Not on file  . Food insecurity:    Worry: Not on file    Inability: Not on file  . Transportation needs:    Medical: Not on file    Non-medical: Not on file  Tobacco Use  . Smoking status: Current Some Day Smoker    Packs/day: 0.10    Years: 10.00    Pack years: 1.00    Types: Cigars  . Smokeless tobacco: Never Used  . Tobacco comment: smokes 2 Black & Milds per day 08/19/13  Substance and Sexual Activity  . Alcohol use: Yes    Comment: 1-2 times yearly  . Drug use: No  . Sexual activity: Yes    Birth control/protection: Other-see comments, Surgical  Lifestyle  . Physical activity:    Days per week: Not on file    Minutes per session: Not on file  . Stress: Not on file    Relationships  . Social connections:    Talks on phone: Not on file    Gets together: Not on file    Attends religious service: Not on file    Active member of club or organization: Not on file    Attends meetings of clubs or organizations: Not on file    Relationship status: Not on file  . Intimate partner violence:    Fear of current or ex partner: Not on file    Emotionally abused: Not on file    Physically abused: Not on file    Forced sexual activity: Not on file  Other Topics Concern  . Not on file  Social History Narrative   Patient lives at home with family.   Patient goes to [employer].   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     Family History  Problem Relation Age of Onset  . Heart disease Father        H/O CABG  . Hypertension Mother   . Hyperlipidemia Maternal Grandmother   . Hypertension Maternal Grandmother   . Hypertension Paternal Grandmother   . Diabetes Paternal Grandmother   . Cancer - Lung Paternal Grandmother   . Breast cancer Neg Hx      Review of Systems: General: negative for chills, fever, night sweats or weight changes.  Cardiovascular: negative for chest pain, dyspnea on exertion, edema, orthopnea, palpitations, paroxysmal nocturnal dyspnea or shortness of breath Dermatological: negative for rash Respiratory: negative for cough or wheezing Urologic: negative for hematuria Abdominal: negative for nausea, vomiting, diarrhea, bright red blood per rectum, melena, or hematemesis Neurologic: negative for visual changes, syncope, or dizziness All other systems reviewed and are otherwise negative except as noted above.    Blood pressure (!) 159/102, pulse (!) 59, height 5\' 4"  (1.626 m), weight 149 lb (67.6 kg).  Repeat B/P by me 152/82 General appearance: alert, cooperative and no distress Neck: no carotid bruit and no JVD Lungs: clear to auscultation bilaterally Heart: regular  rate and rhythm and 2/6 systolic murmur at AOV area and Lt axilary area Extremities: trace edema Skin: warm and dry Neurologic: Grossly normal   ASSESSMENT AND PLAN:   Atypical chest pain Atypical chest pain-low risk Myoview Sept 2019  Essential hypertension Difficult to control, in=tolerant to ACE/ARB-cough  Murmur, cardiac Check echo- f/u on LVH seen in 2015  Smoker She has not smoked in the last 2 months   PLAN  I suggested she increase her  Hydralazine to 12.5 mg BID. She should have a f/u in the HTN clinic in a few weeks. I congratulated her on not smoking. If her B/P is not controlled in a few weeks I would consider challenging her with an ARB now that she has stopped smoking. We also discussed low salt diet.   I ordered an echo to f/u on her murmur, LVH, and diastolic dysfunction.   Kerin Ransom PA-C 03/26/2018 11:16 AM

## 2018-03-26 NOTE — Assessment & Plan Note (Signed)
She has not smoked in the last 2 months

## 2018-03-26 NOTE — Patient Instructions (Addendum)
Medication Instructions:  INCREASE YOUR HYDRALAZINE TO TWICE A DAY  If you need a refill on your cardiac medications before your next appointment, please call your pharmacy.   Lab work: NONE  Testing/Procedures: Your physician has requested that you have an echocardiogram. Echocardiography is a painless test that uses sound waves to create images of your heart. It provides your doctor with information about the size and shape of your heart and how well your heart's chambers and valves are working. This procedure takes approximately one hour. There are no restrictions for this procedure. [place] [address]  Follow-Up: At [facility], you and your health needs are our priority.  As part of our continuing mission to provide you with exceptional heart care, we have created designated Provider Care Teams.  These Care Teams include your primary Cardiologist (physician) and Advanced Practice Providers (APPs -  Physician Assistants and Nurse Practitioners) who all work together to provide you with the care you need, when you need it. You will need a follow up appointment in 6 months.  Please call our office 2 months in advance to schedule this appointment.  You may see DR Gwenlyn Found or one of the following Advanced Practice Providers on your designated Care Team:   Kerin Ransom, PA-C Roby Lofts, [place] . Sande Rives, PA-C  Your physician recommends that you schedule a follow-up appointment in: [place]

## 2018-03-26 NOTE — Assessment & Plan Note (Signed)
Atypical chest pain-low risk Myoview Sept 2019

## 2018-03-26 NOTE — Assessment & Plan Note (Signed)
Difficult to control, in=tolerant to ACE/ARB-cough

## 2018-04-02 ENCOUNTER — Ambulatory Visit: Payer: Self-pay | Admitting: *Deleted

## 2018-04-02 ENCOUNTER — Other Ambulatory Visit (HOSPITAL_COMMUNITY)
Admission: RE | Admit: 2018-04-02 | Discharge: 2018-04-02 | Disposition: A | Discharge status: Home / Self Care | Payer: BLUE CROSS/BLUE SHIELD | Source: Ambulatory Visit | Attending: Physician Assistant | Admitting: Physician Assistant

## 2018-04-02 ENCOUNTER — Encounter: Payer: Self-pay | Admitting: Physician Assistant

## 2018-04-02 ENCOUNTER — Ambulatory Visit (INDEPENDENT_AMBULATORY_CARE_PROVIDER_SITE_OTHER): Payer: BLUE CROSS/BLUE SHIELD | Admitting: Physician Assistant

## 2018-04-02 ENCOUNTER — Telehealth: Payer: Self-pay | Admitting: *Deleted

## 2018-04-02 VITALS — BP 130/84 | HR 84 | Temp 98.4°F | Ht 64.0 in | Wt 152.0 lb

## 2018-04-02 DIAGNOSIS — N898 Other specified noninflammatory disorders of vagina: Secondary | ICD-10-CM | POA: Diagnosis not present

## 2018-04-02 DIAGNOSIS — R1032 Left lower quadrant pain: Secondary | ICD-10-CM

## 2018-04-02 DIAGNOSIS — M542 Cervicalgia: Secondary | ICD-10-CM

## 2018-04-02 DIAGNOSIS — R300 Dysuria: Secondary | ICD-10-CM

## 2018-04-02 LAB — POCT URINALYSIS DIPSTICK
Bilirubin, UA: NEGATIVE
Blood, UA: NEGATIVE
Glucose, UA: NEGATIVE
Ketones, UA: NEGATIVE
Leukocytes, UA: NEGATIVE
Nitrite, UA: NEGATIVE
Protein, UA: NEGATIVE
Spec Grav, UA: 1.015 (ref 1.010–1.025)
Urobilinogen, UA: 1 E.U./dL
pH, UA: 7.5 (ref 5.0–8.0)

## 2018-04-02 MED ORDER — PREDNISONE 20 MG PO TABS: 40.0000 mg | ORAL_TABLET | Freq: Every day | ORAL | 0 refills | Status: DC

## 2018-04-02 NOTE — Telephone Encounter (Signed)
Patient is seeing you today.  

## 2018-04-02 NOTE — Telephone Encounter (Signed)
Patient has pain in her left neck and shoulder- she states it has been present for 2 days. Patient feels she may have strained something- or slept work- it hurts when she turns to the left.  Patient also has abdominal cramping that she thinks is UTI/BV related. She states she has discharge and odor- but is not sure if it is UTI or BV related.   Reason for Disposition . [1] SEVERE neck pain (e.g., excruciating, unable to do any normal activities) AND [2] not improved after 2 hours of pain medicine  Answer Assessment - Initial Assessment Questions 1. ONSET: "When did the pain begin?"      2 days 2. LOCATION: "Where does it hurt?"      left 3. PATTERN "Does the pain come and go, or has it been constant since it started?"      Comes and goes- depends on what she is doing 4. SEVERITY: "How bad is the pain?"  (Scale 1-10; or mild, moderate, severe)   - MILD (1-3): doesn't interfere with normal activities    - MODERATE (4-7): interferes with normal activities or awakens from sleep    - SEVERE (8-10):  excruciating pain, unable to do any normal activities      8 5. RADIATION: "Does the pain go anywhere else, shoot into your arms?"     Radiates down into arm- 6. CORD SYMPTOMS: "Any weakness or numbness of the arms or legs?"     no 7. CAUSE: "What do you think is causing the neck pain?"     Patient thought she had slept wrong 8. NECK OVERUSE: "Any recent activities that involved turning or twisting the neck?"     no 9. OTHER SYMPTOMS: "Do you have any other symptoms?" (e.g., headache, fever, chest pain, difficulty breathing, neck swelling)     headache 10. PREGNANCY: "Is there any chance you are pregnant?" "When was your last menstrual period?"       n/a  Answer Assessment - Initial Assessment Questions 1. DISCHARGE: "Describe the discharge." (e.g., white, yellow, green, gray, foamy, cottage cheese-like)     white 2. ODOR: "Is there a bad odor?"     yes 3. ONSET: "When did the discharge  begin?"     2 days ago 4. RASH: "Is there a rash in that area?" If so, ask: "Describe it." (e.g., redness, blisters, sores, bumps)     no 5. ABDOMINAL PAIN: "Are you having any abdominal pain?" If yes: "What does it feel like? " (e.g., crampy, dull, intermittent, constant)      Yes- cramping has been present- over 1 week 6. ABDOMINAL PAIN SEVERITY: If present, ask: "How bad is it?"  (e.g., mild, moderate, severe)  - MILD - doesn't interfere with normal activities   - MODERATE - interferes with normal activities or awakens from sleep   - SEVERE - patient doesn't want to move (R/O peritonitis)      Severe 7. CAUSE: "What do you think is causing the discharge?" "Have you had the same problem before? What happened then?"     UTI/BV 8. OTHER SYMPTOMS: "Do you have any other symptoms?" (e.g., fever, itching, vaginal bleeding, pain with urination, injury to genital area, vaginal foreign body)     No itching or irritation, no sure where odor id coming from, patient does report frequency 9. PREGNANCY: "Is there any chance you are pregnant?" "When was your last menstrual period?"     n/a  Protocols used: NECK PAIN OR STIFFNESS-A-AH, VAGINAL  DISCHARGE-A-AH

## 2018-04-02 NOTE — Telephone Encounter (Signed)
See note

## 2018-04-02 NOTE — Progress Notes (Signed)
Suzanne [name] is a 42 y.o. female here for a new problem.  I acted as a Education administrator for [employer], PA-C Anselmo Pickler, LPN  History of Present Illness:   Chief Complaint  Patient presents with  . Neck Pain  . Urinary Tract Infection    Neck Pain   This is a new problem. Episode onset: Started 2 days ago. The problem occurs constantly. The problem has been gradually worsening. The pain is associated with nothing. The pain is present in the left side. The quality of the pain is described as aching and stabbing. The pain is at a severity of 7/10. The pain is moderate. The symptoms are aggravated by position and twisting. Worse during: worse first thing in the morning. Stiffness is present in the morning. Associated symptoms include headaches. Pertinent negatives include no chest pain, fever, pain with swallowing, trouble swallowing or visual change. She has tried muscle relaxants and heat for the symptoms. The treatment provided no relief.  Urinary Tract Infection   This is a new problem. Episode onset: started about a week ago. The problem occurs every urination. The problem has been gradually worsening. Quality: pressure and odor. The pain is at a severity of 8/10. The pain is moderate. There has been no fever. She is sexually active. There is no history of pyelonephritis. Associated symptoms include chills, a discharge (started 3 days ago, white vaginal dischage, denies itching), flank pain (Left ), frequency and urgency. Pertinent negatives include no hematuria, nausea or vomiting. Treatments tried: Azo. The treatment provided no relief. There is no history of kidney stones, recurrent UTIs or a single kidney.   She states that she has a history of ovarian cyst. Had a hysterectomy but still has her ovaries.  Also, in regards to her neck, she has a history of requiring epidural injections "several years ago" for her neck pain.    Past Medical History:  Diagnosis Date  . Allergy   .  Anemia   . Anxiety   . Arthritis   . Asthma   . Chest pain   . Chronic lower back pain   . Depression   . GERD (gastroesophageal reflux disease)   . Hypertension   . Migraine   . Neck pain, chronic   . Shortness of breath      Social History   Socioeconomic History  . Marital status: Married    Spouse name: [name]  . Number of children: 2  . Years of education: College  . Highest education level: Not on file  Occupational History  . Occupation: food Public house manager: OTHER    Comment: [place]  . Financial resource strain: Not on file  . Food insecurity:    Worry: Not on file    Inability: Not on file  . Transportation needs:    Medical: Not on file    Non-medical: Not on file  Tobacco Use  . Smoking status: Former Smoker    Packs/day: 0.10    Years: 10.00    Pack years: 1.00    Types: Cigars    Last attempt to quit: 12/06/2017    Years since quitting: 0.3  . Smokeless tobacco: Never Used  . Tobacco comment: smokes 2 Black & Milds per day 08/19/13  Substance and Sexual Activity  . Alcohol use: Yes    Comment: 1-2 times yearly  . Drug use: No  . Sexual activity: Yes    Birth control/protection: Other-see comments,  Surgical  Lifestyle  . Physical activity:    Days per week: Not on file    Minutes per session: Not on file  . Stress: Not on file  Relationships  . Social connections:    Talks on phone: Not on file    Gets together: Not on file    Attends religious service: Not on file    Active member of club or organization: Not on file    Attends meetings of clubs or organizations: Not on file    Relationship status: Not on file  . Intimate partner violence:    Fear of current or ex partner: Not on file    Emotionally abused: Not on file    Physically abused: Not on file    Forced sexual activity: Not on file  Other Topics Concern  . Not on file  Social History Narrative   Patient lives at home with family.   Patient  goes to [employer].   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    Past Surgical History:  Procedure Laterality Date  . ESOPHAGEAL DILATION    . ESOPHAGEAL MANOMETRY N/A 04/01/2013   Procedure: ESOPHAGEAL MANOMETRY (EM);  Surgeon: Milus Banister, MD;  Location: [facility];  Service: Endoscopy;  Laterality: N/A;  . ESOPHAGOGASTRODUODENOSCOPY (EGD) WITH ESOPHAGEAL DILATION N/A 02/28/2013   Procedure: ESOPHAGOGASTRODUODENOSCOPY (EGD) WITH ESOPHAGEAL DILATION;  Surgeon: Milus Banister, MD;  Location: [facility];  Service: Endoscopy;  Laterality: N/A;  . TUBAL LIGATION    . VAGINAL HYSTERECTOMY      Family History  Problem Relation Age of Onset  . Heart disease Father        H/O CABG  . Hypertension Mother   . Hyperlipidemia Maternal Grandmother   . Hypertension Maternal Grandmother   . Hypertension Paternal Grandmother   . Diabetes Paternal Grandmother   . Cancer - Lung Paternal Grandmother   . Breast cancer Neg Hx     Allergies  Allergen Reactions  . Reglan [Metoclopramide] Shortness Of Breath  . Lisinopril Cough  . Losartan Cough  . Sulfa Antibiotics Nausea And Vomiting  . Amoxicillin Rash    Has patient had a PCN reaction causing immediate rash, facial/tongue/throat swelling, SOB or lightheadedness with hypotension: Yes Has patient had a PCN reaction causing severe rash involving mucus membranes or skin necrosis: Broke out face & buttocks Has patient had a PCN reaction that required hospitalization: No Has patient had a PCN reaction occurring within the last 10 years: No If all of the above answers are "NO", then may proceed with Cephalosporin use  . Contrast Media [Iodinated Diagnostic Agents] Rash    Current Medications:   Current Outpatient Medications:  .  albuterol (PROVENTIL) (2.5 MG/3ML) 0.083% nebulizer solution, Take 3 mLs (2.5 mg total) by nebulization every 4 (four) hours as needed for wheezing or  shortness of breath., Disp: 150 mL, Rfl: 1 .  amLODipine (NORVASC) 10 MG tablet, Take 1 tablet (10 mg total) by mouth daily., Disp: 90 tablet, Rfl: 2 .  carvedilol (COREG) 3.125 MG tablet, Take 3.125mg  every evening for 1 week, then increase to 3.125mg  twice daily, Disp: 60 tablet, Rfl: 0 .  hydrALAZINE (APRESOLINE) 25 MG tablet, Take 1 tablet (25 mg total) by mouth 2 (two) times daily., Disp: 180 tablet, Rfl: 3 .  hydrochlorothiazide (HYDRODIURIL) 25 MG tablet, Take 1 tablet (25 mg total) by mouth daily., Disp: 90  tablet, Rfl: 1 .  meloxicam (MOBIC) 15 MG tablet, Take 1 tablet (15 mg total) by mouth daily., Disp: 30 tablet, Rfl: 0 .  Multiple Vitamins-Calcium (ONE-A-DAY WOMENS FORMULA PO), Take 1 tablet by mouth daily., Disp: , Rfl:  .  omeprazole (PRILOSEC) 20 MG capsule, Take 1 capsule (20 mg total) by mouth daily., Disp: 30 capsule, Rfl: 3 .  tizanidine (ZANAFLEX) 2 MG capsule, Take 1 capsule (2 mg total) by mouth 2 (two) times daily as needed for muscle spasms., Disp: 60 capsule, Rfl: 2 .  predniSONE (DELTASONE) 20 MG tablet, Take 2 tablets (40 mg total) by mouth daily., Disp: 10 tablet, Rfl: 0   Review of Systems:   Review of Systems  Constitutional: Positive for chills. Negative for fever.  HENT: Negative for trouble swallowing.   Cardiovascular: Negative for chest pain.  Gastrointestinal: Negative for nausea and vomiting.  Genitourinary: Positive for flank pain (Left ), frequency and urgency. Negative for hematuria.  Musculoskeletal: Positive for neck pain.  Neurological: Positive for headaches.    Vitals:   Vitals:   04/02/18 1550  BP: 130/84  Pulse: 84  Temp: 98.4 F (36.9 C)  TempSrc: Oral  Weight: 152 lb (68.9 kg)  Height: 5\' 4"  (1.626 m)     Body mass index is 26.09 kg/m.  Physical Exam:   Physical Exam  Constitutional: She appears well-developed. She is cooperative.  Non-toxic appearance. She does not have a sickly appearance. She does not appear ill. No  distress.  Cardiovascular: Normal rate, regular rhythm, S1 normal, S2 normal, normal heart sounds and normal pulses.  No LE edema  Pulmonary/Chest: Effort normal and breath sounds normal.  Abdominal: Normal appearance and bowel sounds are normal. There is tenderness in the left lower quadrant. There is guarding (very mild). There is no rigidity, no rebound and no CVA tenderness.  Genitourinary: There is no rash, tenderness or lesion on the right labia. There is no rash, tenderness or lesion on the left labia. Cervix exhibits no motion tenderness. Right adnexum displays tenderness. Right adnexum displays no mass. Left adnexum displays no mass and no tenderness. There is tenderness in the vagina. No erythema in the vagina. No foreign body in the vagina. No signs of injury around the vagina. No vaginal discharge found.  Musculoskeletal:  Decreased ROM 2/2 pain with flexion/extension, lateral side bends, and rotation of neck. Reproducible tenderness with deep palpation to L paraspinal cervical muscles. No bony tenderness. No evidence of erythema, rash or ecchymosis.    Neurological: She is alert. GCS eye subscore is 4. GCS verbal subscore is 5. GCS motor subscore is 6.  Grip strength 5/5 bilaterally  Skin: Skin is warm, dry and intact.  Psychiatric: She has a normal mood and affect. Her speech is normal and behavior is normal.  Nursing note and vitals reviewed.  Results for orders placed or performed in visit on 04/02/18  POCT urinalysis dipstick  Result Value Ref Range   Color, UA Yellow    Clarity, UA Clear    Glucose, UA Negative Negative   Bilirubin, UA Negative    Ketones, UA Negative    Spec Grav, UA 1.015 1.010 - 1.025   Blood, UA Negative    pH, UA 7.5 5.0 - 8.0   Protein, UA Negative Negative   Urobilinogen, UA 1.0 0.2 or 1.0 E.U./dL   Nitrite, UA Negative    Leukocytes, UA Negative Negative   Appearance     Odor      Assessment and  Plan:    Leila was seen today for neck  pain and urinary tract infection.  Diagnoses and all orders for this visit:  Vaginal discharge; LLQ abdominal pain She is quite tender on exam. Will obtain pelvic ultrasound for possible ovarian cyst or other pelvic etiology. Labs pending. Urine negative. Vaginal swab pending. ER precautions given. -     POCT urinalysis dipstick -     Cervicovaginal ancillary only; Future -     US PELVIS TRANSVANGINAL NON-OB (TV ONLY); Future -     Cervicovaginal ancillary only  Neck pain No red flags on exam. Will trial oral prednisone. If persists, consider PT vs referral to prior provider who treated neck pain in the past.  Other orders -     predniSONE (DELTASONE) 20 MG tablet; Take 2 tablets (40 mg total) by mouth daily.  . Reviewed expectations re: course of current medical issues. . Discussed self-management of symptoms. . Outlined signs and symptoms indicating need for more acute intervention. . Patient verbalized understanding and all questions were answered. . See orders for this visit as documented in the electronic medical record. . Patient received an After-Visit Summary.  CMA or LPN served as scribe during this visit. History, Physical, and Plan performed by medical provider. The above documentation has been reviewed and is accurate and complete.  Inda Coke, PA-C

## 2018-04-02 NOTE — Telephone Encounter (Signed)
Left voicemail requesting call back to discuss symptoms stated on MyChart appt.

## 2018-04-02 NOTE — Patient Instructions (Addendum)
It was great to see you!  Lets start oral prednisone twice a day for 5 days. This should help your neck pain.  I will be in touch as soon as we get your labs and urine results back.  Someone will be in touch soon regarding your pelvic ultrasound.  Contact a doctor if:  Medicine does not help your pain.  Your pain comes back.  You have new symptoms.  You have unusual vaginal discharge or bleeding.  You have a fever or chills.  You are having a hard time pooping (constipation).  You have blood in your pee (urine) or poop (stool).  Your pee smells bad.  You feel weak or lightheaded. Get help right away if:  You have sudden pain that is very bad.  Your pain continues to get worse.  You have very bad pain and also have any of the following symptoms: ? A fever. ? Feeling stick to your stomach (nausea). ? Throwing up (vomiting). ? Being very sweaty.  You pass out (lose consciousness).

## 2018-04-03 ENCOUNTER — Other Ambulatory Visit: Payer: BLUE CROSS/BLUE SHIELD

## 2018-04-03 ENCOUNTER — Ambulatory Visit
Admission: RE | Admit: 2018-04-03 | Discharge: 2018-04-03 | Disposition: A | Discharge status: Home / Self Care | Payer: BLUE CROSS/BLUE SHIELD | Source: Ambulatory Visit | Attending: Physician Assistant | Admitting: Physician Assistant

## 2018-04-03 DIAGNOSIS — N898 Other specified noninflammatory disorders of vagina: Secondary | ICD-10-CM

## 2018-04-03 DIAGNOSIS — R1032 Left lower quadrant pain: Secondary | ICD-10-CM

## 2018-04-03 LAB — CBC WITH DIFFERENTIAL/PLATELET
Basophils Absolute: 0.1 10*3/uL (ref 0.0–0.1)
Basophils Relative: 1.2 % (ref 0.0–3.0)
Eosinophils Absolute: 0.4 10*3/uL (ref 0.0–0.7)
Eosinophils Relative: 5.7 % — ABNORMAL HIGH (ref 0.0–5.0)
HCT: 33.7 % — ABNORMAL LOW (ref 36.0–46.0)
Hemoglobin: 10.7 g/dL — ABNORMAL LOW (ref 12.0–15.0)
Lymphocytes Relative: 37.6 % (ref 12.0–46.0)
Lymphs Abs: 2.5 10*3/uL (ref 0.7–4.0)
MCHC: 31.7 g/dL (ref 30.0–36.0)
MCV: 75.2 fl — ABNORMAL LOW (ref 78.0–100.0)
Monocytes Absolute: 0.6 10*3/uL (ref 0.1–1.0)
Monocytes Relative: 9.6 % (ref 3.0–12.0)
Neutro Abs: 3 10*3/uL (ref 1.4–7.7)
Neutrophils Relative %: 45.9 % (ref 43.0–77.0)
Platelets: 208 10*3/uL (ref 150.0–400.0)
RBC: 4.47 Mil/uL (ref 3.87–5.11)
RDW: 15.7 % — ABNORMAL HIGH (ref 11.5–15.5)
WBC: 6.6 10*3/uL (ref 4.0–10.5)

## 2018-04-03 LAB — COMPREHENSIVE METABOLIC PANEL
ALT: 11 U/L (ref 0–35)
AST: 16 U/L (ref 0–37)
Albumin: 3.9 g/dL (ref 3.5–5.2)
Alkaline Phosphatase: 48 U/L (ref 39–117)
BUN: 14 mg/dL (ref 6–23)
CO2: 30 mEq/L (ref 19–32)
Calcium: 9 mg/dL (ref 8.4–10.5)
Chloride: 104 mEq/L (ref 96–112)
Creatinine, Ser: 0.79 mg/dL (ref 0.40–1.20)
GFR: 102.31 mL/min (ref >60.00)
Glucose, Bld: 85 mg/dL (ref 70–99)
Potassium: 4.4 mEq/L (ref 3.5–5.1)
Sodium: 138 mEq/L (ref 135–145)
Total Bilirubin: 0.2 mg/dL (ref 0.2–1.2)
Total Protein: 6.7 g/dL (ref 6.0–8.3)

## 2018-04-04 LAB — CERVICOVAGINAL ANCILLARY ONLY
Bacterial vaginitis: POSITIVE — AB
Candida vaginitis: NEGATIVE
Chlamydia: NEGATIVE
Neisseria Gonorrhea: NEGATIVE
Trichomonas: NEGATIVE

## 2018-04-05 ENCOUNTER — Other Ambulatory Visit: Payer: Self-pay | Admitting: Family Medicine

## 2018-04-05 ENCOUNTER — Other Ambulatory Visit: Payer: Self-pay | Admitting: Physician Assistant

## 2018-04-05 DIAGNOSIS — D509 Iron deficiency anemia, unspecified: Secondary | ICD-10-CM

## 2018-04-05 MED ORDER — METRONIDAZOLE 500 MG PO TABS: 500.0000 mg | ORAL_TABLET | Freq: Two times a day (BID) | ORAL | 0 refills | Status: AC

## 2018-04-09 ENCOUNTER — Other Ambulatory Visit: Payer: Self-pay

## 2018-04-09 ENCOUNTER — Ambulatory Visit (HOSPITAL_COMMUNITY): Payer: BLUE CROSS/BLUE SHIELD | Attending: Cardiovascular Disease

## 2018-04-09 DIAGNOSIS — R011 Cardiac murmur, unspecified: Secondary | ICD-10-CM | POA: Insufficient documentation

## 2018-04-16 ENCOUNTER — Ambulatory Visit: Payer: BLUE CROSS/BLUE SHIELD

## 2018-04-16 NOTE — Progress Notes (Deleted)
Patient ID: Suzanne [name]                 DOB: 08-02-1975                      MRN: [id number]     HPI: Suzanne [name] is a 42 y.o. female referred by Dr. Gwenlyn Found to HTN clinic. PMH includes chest pain, SOB, tobacco abuse, hypertension, asthma, depression, and migraines.  Patient presents for initial HTN evaluation right after completing stress test this morning. She did not have any of her medication this morning either. She is currently asymptomatic but reports occasional chest pain and breathing problems. Notes some palpitations and dizziness. This resolves when she rests for a while. Patient is currently under assessment with cardiologist for these problems.   Patient is now taking hydralazine 12.5 TID due to dizziness with 25 mg TID.  Current HTN meds:  Amlodipine 10mg  daily Hydralazine 25mg  twice daily HCTZ 25mg  daily  Intolerance:  Lisinopril - cough Losartan - cough  BP goal: 130/80  Family History:   Social History: current smoker, occasional alcohol  Diet: continues to have poor appetite, >40lb pf unintentional weight loss in 8 months  Exercise: activities of daily living  Home BP readings: Patient reports SBP in 150s  Wt Readings from Last 3 Encounters:  04/02/18 152 lb (68.9 kg)  03/26/18 149 lb (67.6 kg)  02/12/18 146 lb 9.6 oz (66.5 kg)   BP Readings from Last 3 Encounters:  04/02/18 130/84  03/26/18 (!) 159/102  02/12/18 (!) 150/82   Pulse Readings from Last 3 Encounters:  04/02/18 84  03/26/18 (!) 59  02/12/18 74    Past Medical History:  Diagnosis Date  . Allergy   . Anemia   . Anxiety   . Arthritis   . Asthma   . Chest pain   . Chronic lower back pain   . Depression   . GERD (gastroesophageal reflux disease)   . Hypertension   . Migraine   . Neck pain, chronic   . Shortness of breath     Current Outpatient Medications on File Prior to Visit  Medication Sig Dispense Refill  . albuterol (PROVENTIL) (2.5 MG/3ML) 0.083% nebulizer solution  Take 3 mLs (2.5 mg total) by nebulization every 4 (four) hours as needed for wheezing or shortness of breath. 150 mL 1  . amLODipine (NORVASC) 10 MG tablet Take 1 tablet (10 mg total) by mouth daily. 90 tablet 2  . carvedilol (COREG) 3.125 MG tablet Take 3.125mg  every evening for 1 week, then increase to 3.125mg  twice daily 60 tablet 0  . hydrALAZINE (APRESOLINE) 25 MG tablet Take 1 tablet (25 mg total) by mouth 2 (two) times daily. 180 tablet 3  . hydrochlorothiazide (HYDRODIURIL) 25 MG tablet Take 1 tablet (25 mg total) by mouth daily. 90 tablet 1  . meloxicam (MOBIC) 15 MG tablet Take 1 tablet (15 mg total) by mouth daily. 30 tablet 0  . Multiple Vitamins-Calcium (ONE-A-DAY WOMENS FORMULA PO) Take 1 tablet by mouth daily.    Marland Kitchen omeprazole (PRILOSEC) 20 MG capsule Take 1 capsule (20 mg total) by mouth daily. 30 capsule 3  . predniSONE (DELTASONE) 20 MG tablet Take 2 tablets (40 mg total) by mouth daily. 10 tablet 0  . tizanidine (ZANAFLEX) 2 MG capsule Take 1 capsule (2 mg total) by mouth 2 (two) times daily as needed for muscle spasms. 60 capsule 2   No current facility-administered medications on file prior  to visit.     Allergies  Allergen Reactions  . Reglan [Metoclopramide] Shortness Of Breath  . Lisinopril Cough  . Losartan Cough  . Sulfa Antibiotics Nausea And Vomiting  . Amoxicillin Rash    Has patient had a PCN reaction causing immediate rash, facial/tongue/throat swelling, SOB or lightheadedness with hypotension: Yes Has patient had a PCN reaction causing severe rash involving mucus membranes or skin necrosis: Broke out face & buttocks Has patient had a PCN reaction that required hospitalization: No Has patient had a PCN reaction occurring within the last 10 years: No If all of the above answers are "NO", then may proceed with Cephalosporin use  . Contrast Media [Iodinated Diagnostic Agents] Rash    There were no vitals taken for this visit.  No problem-specific Assessment &  Plan notes found for this encounter.   Auriella Wieand Rodriguez-Guzman PharmD, BCPS, [place] [address] 04/16/2018 2:02 PM

## 2018-04-17 ENCOUNTER — Ambulatory Visit: Payer: Self-pay | Admitting: *Deleted

## 2018-04-17 NOTE — Telephone Encounter (Signed)
Three attempts made to discuss symptoms with pt. Will close encounter.

## 2018-04-17 NOTE — Telephone Encounter (Signed)
Returned call to patient.  Left VM to call office.

## 2018-04-17 NOTE — Telephone Encounter (Signed)
Patient called and says the vaginal discharge and pain is not better after taking the Flagyl. She asks what else can be done, will I have to take more antibiotics. She asks is this something that her husband can get from her. I advised the literature says that it can't be passed, but if he's having symptoms, he will need to get evaluated, she says he's willing to do whatever is needed. She also says she would like information on what can be done on her part to get rid of it for good. I advised I will send this to [facility] for review and someone will call back with her recommendation.  [place] ([address] [place] [phone] (Phone) [phone] (Fax)

## 2018-04-17 NOTE — Telephone Encounter (Signed)
See note

## 2018-04-18 NOTE — Telephone Encounter (Signed)
Needs office visit.

## 2018-04-18 NOTE — Telephone Encounter (Signed)
Spoke to pt told her needs an appointment per [facility]. Pt verbalized understanding. Appointment scheduled for Friday 11/22 at 8:00 AM. Pt.verbalized understanding.

## 2018-04-18 NOTE — Telephone Encounter (Signed)
Samantha, please see message and advise. 

## 2018-04-20 ENCOUNTER — Ambulatory Visit (INDEPENDENT_AMBULATORY_CARE_PROVIDER_SITE_OTHER): Payer: BLUE CROSS/BLUE SHIELD | Admitting: Physician Assistant

## 2018-04-20 ENCOUNTER — Encounter: Payer: Self-pay | Admitting: Physician Assistant

## 2018-04-20 ENCOUNTER — Other Ambulatory Visit (HOSPITAL_COMMUNITY)
Admission: RE | Admit: 2018-04-20 | Discharge: 2018-04-20 | Disposition: A | Discharge status: Home / Self Care | Payer: BLUE CROSS/BLUE SHIELD | Source: Ambulatory Visit | Attending: Physician Assistant | Admitting: Physician Assistant

## 2018-04-20 VITALS — BP 112/68 | HR 72 | Temp 98.5°F | Ht 64.0 in | Wt 144.4 lb

## 2018-04-20 DIAGNOSIS — R350 Frequency of micturition: Secondary | ICD-10-CM | POA: Diagnosis not present

## 2018-04-20 DIAGNOSIS — N898 Other specified noninflammatory disorders of vagina: Secondary | ICD-10-CM

## 2018-04-20 DIAGNOSIS — R102 Pelvic and perineal pain: Secondary | ICD-10-CM

## 2018-04-20 LAB — POCT URINALYSIS DIPSTICK
Bilirubin, UA: NEGATIVE
Blood, UA: NEGATIVE
Glucose, UA: NEGATIVE
Ketones, UA: NEGATIVE
Leukocytes, UA: NEGATIVE
Nitrite, UA: NEGATIVE
Protein, UA: NEGATIVE
Spec Grav, UA: 1.02 (ref 1.010–1.025)
Urobilinogen, UA: 0.2 E.U./dL
pH, UA: 6 (ref 5.0–8.0)

## 2018-04-20 MED ORDER — METRONIDAZOLE 500 MG PO TABS: 500.0000 mg | ORAL_TABLET | Freq: Two times a day (BID) | ORAL | 0 refills | Status: AC

## 2018-04-20 MED ORDER — TRAMADOL HCL 50 MG PO TABS: 50.0000 mg | ORAL_TABLET | Freq: Four times a day (QID) | ORAL | 0 refills | Status: DC | PRN

## 2018-04-20 NOTE — Progress Notes (Signed)
Suzanne [name] is a 42 y.o. female here for a new problem.  History of Present Illness:   Chief Complaint  Patient presents with  . Vaginal Discharge    HPI   Patient last saw this provider on 04/02/2018.  She had complaints of urinary pressure and left lower quadrant pain.  She also noted some white vaginal discharge with odor.  Work-up at that time revealed positive BV and normal urine.  She also underwent a pelvic ultrasound.   Results on April 03 2018 showed: IMPRESSION: No acute process within the pelvis.  Surgically absent uterus.  Small subcentimeter anechoic area within the midline pelvis potentially representing a small cyst or fluid, likely of no clinical significance.  Since that time she continues to have ongoing symptoms.  She was given Flagyl and states that she took as directed however she did not have any improvement in symptoms.  She also tells me that a year ago she had the exact same symptoms and went to OB/GYN and they were unable to tell her what the cause of her left lower quadrant pain was.  She denies any unusual or new symptoms since she is last seen [place] in the office. Denies any vaginal bleeding, changes in bowels, or painful BM.   Past Medical History:  Diagnosis Date  . Allergy   . Anemia   . Anxiety   . Arthritis   . Asthma   . Chest pain   . Chronic lower back pain   . Depression   . GERD (gastroesophageal reflux disease)   . Hypertension   . Migraine   . Neck pain, chronic   . Shortness of breath      Social History   Socioeconomic History  . Marital status: Married    Spouse name: [name]  . Number of children: 2  . Years of education: College  . Highest education level: Not on file  Occupational History  . Occupation: food Public house manager: OTHER    Comment: [place]  . Financial resource strain: Not on file  . Food insecurity:    Worry: Not on file    Inability: Not on file  . Transportation  needs:    Medical: Not on file    Non-medical: Not on file  Tobacco Use  . Smoking status: Former Smoker    Packs/day: 0.10    Years: 10.00    Pack years: 1.00    Types: Cigars    Last attempt to quit: 12/06/2017    Years since quitting: 0.3  . Smokeless tobacco: Never Used  . Tobacco comment: smokes 2 Black & Milds per day 08/19/13  Substance and Sexual Activity  . Alcohol use: Yes    Comment: 1-2 times yearly  . Drug use: No  . Sexual activity: Yes    Birth control/protection: Other-see comments, Surgical  Lifestyle  . Physical activity:    Days per week: Not on file    Minutes per session: Not on file  . Stress: Not on file  Relationships  . Social connections:    Talks on phone: Not on file    Gets together: Not on file    Attends religious service: Not on file    Active member of club or organization: Not on file    Attends meetings of clubs or organizations: Not on file    Relationship status: Not on file  . Intimate partner violence:    Fear of current  or ex partner: Not on file    Emotionally abused: Not on file    Physically abused: Not on file    Forced sexual activity: Not on file  Other Topics Concern  . Not on file  Social History Narrative   Patient lives at home with family.   Patient goes to [employer].   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    Past Surgical History:  Procedure Laterality Date  . ESOPHAGEAL DILATION    . ESOPHAGEAL MANOMETRY N/A 04/01/2013   Procedure: ESOPHAGEAL MANOMETRY (EM);  Surgeon: Milus Banister, MD;  Location: [facility];  Service: Endoscopy;  Laterality: N/A;  . ESOPHAGOGASTRODUODENOSCOPY (EGD) WITH ESOPHAGEAL DILATION N/A 02/28/2013   Procedure: ESOPHAGOGASTRODUODENOSCOPY (EGD) WITH ESOPHAGEAL DILATION;  Surgeon: Milus Banister, MD;  Location: [facility];  Service: Endoscopy;  Laterality: N/A;  . TUBAL LIGATION    . VAGINAL HYSTERECTOMY      Family  History  Problem Relation Age of Onset  . Heart disease Father        H/O CABG  . Hypertension Mother   . Hyperlipidemia Maternal Grandmother   . Hypertension Maternal Grandmother   . Hypertension Paternal Grandmother   . Diabetes Paternal Grandmother   . Cancer - Lung Paternal Grandmother   . Breast cancer Neg Hx     Allergies  Allergen Reactions  . Reglan [Metoclopramide] Shortness Of Breath  . Lisinopril Cough  . Losartan Cough  . Sulfa Antibiotics Nausea And Vomiting  . Amoxicillin Rash    Has patient had a PCN reaction causing immediate rash, facial/tongue/throat swelling, SOB or lightheadedness with hypotension: Yes Has patient had a PCN reaction causing severe rash involving mucus membranes or skin necrosis: Broke out face & buttocks Has patient had a PCN reaction that required hospitalization: No Has patient had a PCN reaction occurring within the last 10 years: No If all of the above answers are "NO", then may proceed with Cephalosporin use  . Contrast Media [Iodinated Diagnostic Agents] Rash    Current Medications:   Current Outpatient Medications:  .  albuterol (PROVENTIL) (2.5 MG/3ML) 0.083% nebulizer solution, Take 3 mLs (2.5 mg total) by nebulization every 4 (four) hours as needed for wheezing or shortness of breath., Disp: 150 mL, Rfl: 1 .  amLODipine (NORVASC) 10 MG tablet, Take 1 tablet (10 mg total) by mouth daily., Disp: 90 tablet, Rfl: 2 .  carvedilol (COREG) 3.125 MG tablet, Take 3.125mg  every evening for 1 week, then increase to 3.125mg  twice daily, Disp: 60 tablet, Rfl: 0 .  hydrALAZINE (APRESOLINE) 25 MG tablet, Take 1 tablet (25 mg total) by mouth 2 (two) times daily., Disp: 180 tablet, Rfl: 3 .  hydrochlorothiazide (HYDRODIURIL) 25 MG tablet, Take 1 tablet (25 mg total) by mouth daily., Disp: 90 tablet, Rfl: 1 .  meloxicam (MOBIC) 15 MG tablet, Take 1 tablet (15 mg total) by mouth daily., Disp: 30 tablet, Rfl: 0 .  Multiple Vitamins-Calcium (ONE-A-DAY  WOMENS FORMULA PO), Take 1 tablet by mouth daily., Disp: , Rfl:  .  omeprazole (PRILOSEC) 20 MG capsule, Take 1 capsule (20 mg total) by mouth daily., Disp: 30 capsule, Rfl: 3 .  tizanidine (ZANAFLEX) 2 MG capsule, Take 1 capsule (2 mg total) by mouth 2 (two) times daily as needed for muscle spasms., Disp: 60 capsule, Rfl: 2 .  metroNIDAZOLE (FLAGYL) 500 MG tablet, Take 1 tablet (500 mg total) by  mouth 2 (two) times daily for 7 days., Disp: 14 tablet, Rfl: 0 .  traMADol (ULTRAM) 50 MG tablet, Take 1 tablet (50 mg total) by mouth every 6 (six) hours as needed., Disp: 20 tablet, Rfl: 0   Review of Systems:   ROS Negative unless otherwise specified per HPI.  Vitals:   Vitals:   04/20/18 0810  BP: 112/68  Pulse: 72  Temp: 98.5 F (36.9 C)  TempSrc: Oral  Weight: 144 lb 6.1 oz (65.5 kg)  Height: 5\' 4"  (1.626 m)     Body mass index is 24.78 kg/m.  Physical Exam:   Physical Exam  Constitutional: She appears well-developed. She is cooperative.  Non-toxic appearance. She does not have a sickly appearance. She does not appear ill. No distress.  Cardiovascular: Normal rate, regular rhythm, S1 normal, S2 normal, normal heart sounds and normal pulses.  No LE edema  Pulmonary/Chest: Effort normal and breath sounds normal.  Abdominal: Normal appearance and bowel sounds are normal. There is tenderness in the left lower quadrant. There is no rigidity, no rebound, no guarding and no CVA tenderness.  Genitourinary: There is no rash, tenderness or lesion on the right labia. There is no rash, tenderness or lesion on the left labia. Cervix exhibits no motion tenderness and no friability. Left adnexum displays tenderness. Vaginal discharge (thick, white) found.  Neurological: She is alert. GCS eye subscore is 4. GCS verbal subscore is 5. GCS motor subscore is 6.  Skin: Skin is warm, dry and intact.  Psychiatric: She has a normal mood and affect. Her speech is normal and behavior is normal.  Nursing  note and vitals reviewed.     Results for orders placed or performed in visit on 04/20/18  POCT urinalysis dipstick  Result Value Ref Range   Color, UA Yellow    Clarity, UA Clear    Glucose, UA Negative Negative   Bilirubin, UA Negative    Ketones, UA Negative    Spec Grav, UA 1.020 1.010 - 1.025   Blood, UA Negative    pH, UA 6.0 5.0 - 8.0   Protein, UA Negative Negative   Urobilinogen, UA 0.2 0.2 or 1.0 E.U./dL   Nitrite, UA Negative    Leukocytes, UA Negative Negative   Appearance     Odor      Assessment and Plan:   Taiana was seen today for vaginal discharge.  Diagnoses and all orders for this visit:  Urinary frequency UA negative. Push fluids. -     POCT urinalysis dipstick  Pelvic pain Will refer to ob-gyn for further work-up. I did prescribe her a short course of Tramadol to help with her symptoms.  -     Ambulatory referral to Obstetrics / Gynecology -     Cervicovaginal ancillary only  Vaginal discharge Swab taken. Will repeat flagyl in the meantime. Ob-Gyn for further work-up.  Other orders -     traMADol (ULTRAM) 50 MG tablet; Take 1 tablet (50 mg total) by mouth every 6 (six) hours as needed. -     metroNIDAZOLE (FLAGYL) 500 MG tablet; Take 1 tablet (500 mg total) by mouth 2 (two) times daily for 7 days.   . Reviewed expectations re: course of current medical issues. . Discussed self-management of symptoms. . Outlined signs and symptoms indicating need for more acute intervention. . Patient verbalized understanding and all questions were answered. . See orders for this visit as documented in the electronic medical record. . Patient received an After-Visit Summary.  CMA or LPN served as scribe during this visit. History, Physical, and Plan performed by medical provider. The above documentation has been reviewed and is accurate and complete.  Inda Coke, PA-C

## 2018-04-20 NOTE — Patient Instructions (Signed)
It was great to see you!  Repeat flagyl for your discharge.  May take tramadol for your pain.  You will be contacted about your referral to ob-gyn.  Take care,  Inda Coke PA-C

## 2018-04-24 LAB — CERVICOVAGINAL ANCILLARY ONLY
Bacterial vaginitis: POSITIVE — AB
Candida vaginitis: NEGATIVE

## 2018-04-25 ENCOUNTER — Telehealth: Payer: Self-pay | Admitting: Obstetrics and Gynecology

## 2018-04-25 NOTE — Telephone Encounter (Signed)
Called and left a message for patient to call back to schedule a new patient doctor referral appointment with our office to see Dr. Talbert Nan for: pelvic pain.

## 2018-04-30 ENCOUNTER — Encounter: Payer: Self-pay | Admitting: Obstetrics and Gynecology

## 2018-04-30 NOTE — Telephone Encounter (Signed)
Patient declined sooner appointments and scheduled for 05/21/18 with Dr. Talbert Nan. Also declined waiting list

## 2018-05-08 DIAGNOSIS — B0052 Herpesviral keratitis: Secondary | ICD-10-CM | POA: Diagnosis not present

## 2018-05-11 DIAGNOSIS — B0052 Herpesviral keratitis: Secondary | ICD-10-CM | POA: Diagnosis not present

## 2018-05-12 ENCOUNTER — Emergency Department (HOSPITAL_COMMUNITY): Payer: BLUE CROSS/BLUE SHIELD

## 2018-05-12 ENCOUNTER — Emergency Department (HOSPITAL_COMMUNITY)
Admission: EM | Admit: 2018-05-12 | Discharge: 2018-05-12 | Disposition: A | Discharge status: Home / Self Care | Payer: BLUE CROSS/BLUE SHIELD | Attending: Emergency Medicine | Admitting: Emergency Medicine

## 2018-05-12 DIAGNOSIS — Z79899 Other long term (current) drug therapy: Secondary | ICD-10-CM | POA: Diagnosis not present

## 2018-05-12 DIAGNOSIS — R0789 Other chest pain: Secondary | ICD-10-CM | POA: Diagnosis not present

## 2018-05-12 DIAGNOSIS — I10 Essential (primary) hypertension: Secondary | ICD-10-CM | POA: Diagnosis not present

## 2018-05-12 DIAGNOSIS — R1110 Vomiting, unspecified: Secondary | ICD-10-CM

## 2018-05-12 DIAGNOSIS — R1084 Generalized abdominal pain: Secondary | ICD-10-CM | POA: Diagnosis not present

## 2018-05-12 DIAGNOSIS — R079 Chest pain, unspecified: Secondary | ICD-10-CM | POA: Diagnosis not present

## 2018-05-12 DIAGNOSIS — J45909 Unspecified asthma, uncomplicated: Secondary | ICD-10-CM | POA: Insufficient documentation

## 2018-05-12 DIAGNOSIS — R1013 Epigastric pain: Secondary | ICD-10-CM | POA: Diagnosis not present

## 2018-05-12 DIAGNOSIS — R112 Nausea with vomiting, unspecified: Secondary | ICD-10-CM | POA: Diagnosis not present

## 2018-05-12 DIAGNOSIS — Z87891 Personal history of nicotine dependence: Secondary | ICD-10-CM | POA: Insufficient documentation

## 2018-05-12 LAB — CBC WITH DIFFERENTIAL/PLATELET
Abs Immature Granulocytes: 0.08 10*3/uL — ABNORMAL HIGH (ref 0.00–0.07)
Basophils Absolute: 0 10*3/uL (ref 0.0–0.1)
Basophils Relative: 0 %
Eosinophils Absolute: 0 10*3/uL (ref 0.0–0.5)
Eosinophils Relative: 0 %
HCT: 40.1 % (ref 36.0–46.0)
Hemoglobin: 12.7 g/dL (ref 12.0–15.0)
Immature Granulocytes: 1 %
Lymphocytes Relative: 10 %
Lymphs Abs: 1.6 10*3/uL (ref 0.7–4.0)
MCH: 23.3 pg — ABNORMAL LOW (ref 26.0–34.0)
MCHC: 31.7 g/dL (ref 30.0–36.0)
MCV: 73.4 fL — ABNORMAL LOW (ref 80.0–100.0)
Monocytes Absolute: 1.6 10*3/uL — ABNORMAL HIGH (ref 0.1–1.0)
Monocytes Relative: 10 %
Neutro Abs: 13.1 10*3/uL — ABNORMAL HIGH (ref 1.7–7.7)
Neutrophils Relative %: 79 %
Platelets: 316 10*3/uL (ref 150–400)
RBC: 5.46 MIL/uL — ABNORMAL HIGH (ref 3.87–5.11)
RDW: 15.6 % — ABNORMAL HIGH (ref 11.5–15.5)
WBC: 16.5 10*3/uL — ABNORMAL HIGH (ref 4.0–10.5)
nRBC: 0 % (ref 0.0–0.2)

## 2018-05-12 LAB — COMPREHENSIVE METABOLIC PANEL
ALT: 18 U/L (ref 0–44)
AST: 31 U/L (ref 15–41)
Albumin: 4.1 g/dL (ref 3.5–5.0)
Alkaline Phosphatase: 55 U/L (ref 38–126)
Anion gap: 18 — ABNORMAL HIGH (ref 5–15)
BUN: 13 mg/dL (ref 6–20)
CO2: 22 mmol/L (ref 22–32)
Calcium: 10 mg/dL (ref 8.9–10.3)
Chloride: 100 mmol/L (ref 98–111)
Creatinine, Ser: 1.39 mg/dL — ABNORMAL HIGH (ref 0.44–1.00)
GFR calc Af Amer: 54 mL/min — ABNORMAL LOW (ref >60)
GFR calc non Af Amer: 47 mL/min — ABNORMAL LOW (ref >60)
Glucose, Bld: 123 mg/dL — ABNORMAL HIGH (ref 70–99)
Potassium: 3.2 mmol/L — ABNORMAL LOW (ref 3.5–5.1)
Sodium: 140 mmol/L (ref 135–145)
Total Bilirubin: 0.7 mg/dL (ref 0.3–1.2)
Total Protein: 7.9 g/dL (ref 6.5–8.1)

## 2018-05-12 LAB — URINALYSIS, ROUTINE W REFLEX MICROSCOPIC
Bilirubin Urine: NEGATIVE
Glucose, UA: NEGATIVE mg/dL
Hgb urine dipstick: NEGATIVE
Ketones, ur: NEGATIVE mg/dL
Leukocytes, UA: NEGATIVE
Nitrite: NEGATIVE
Protein, ur: NEGATIVE mg/dL
Specific Gravity, Urine: 1.006 (ref 1.005–1.030)
pH: 7 (ref 5.0–8.0)

## 2018-05-12 LAB — LIPASE, BLOOD: Lipase: 60 U/L — ABNORMAL HIGH (ref 11–51)

## 2018-05-12 MED ORDER — FENTANYL CITRATE (PF) 100 MCG/2ML IJ SOLN
50.0000 ug | Freq: Once | INTRAMUSCULAR | Status: AC
  Administered 2018-05-12: 50 ug via INTRAVENOUS
  Filled 2018-05-12: qty 2

## 2018-05-12 MED ORDER — DICYCLOMINE HCL 20 MG PO TABS: 20.0000 mg | ORAL_TABLET | Freq: Two times a day (BID) | ORAL | 0 refills | Status: DC

## 2018-05-12 MED ORDER — ALUM & MAG HYDROXIDE-SIMETH 200-200-20 MG/5ML PO SUSP
30.0000 mL | Freq: Once | ORAL | Status: AC
  Administered 2018-05-12: 30 mL via ORAL
  Filled 2018-05-12: qty 30

## 2018-05-12 MED ORDER — LIDOCAINE VISCOUS HCL 2 % MT SOLN
15.0000 mL | Freq: Once | OROMUCOSAL | Status: AC
  Administered 2018-05-12: 15 mL via ORAL
  Filled 2018-05-12: qty 15

## 2018-05-12 MED ORDER — PROMETHAZINE HCL 25 MG/ML IJ SOLN
12.5000 mg | Freq: Once | INTRAMUSCULAR | Status: AC
  Administered 2018-05-12: 12.5 mg via INTRAMUSCULAR
  Filled 2018-05-12: qty 1

## 2018-05-12 MED ORDER — LACTATED RINGERS IV BOLUS
1000.0000 mL | Freq: Once | INTRAVENOUS | Status: AC
  Administered 2018-05-12: 1000 mL via INTRAVENOUS

## 2018-05-12 MED ORDER — ONDANSETRON 4 MG PO TBDP: ORAL_TABLET | ORAL | 0 refills | Status: DC

## 2018-05-12 MED ORDER — SODIUM CHLORIDE 0.9 % IV SOLN
8.0000 mg | Freq: Once | INTRAVENOUS | Status: AC
  Administered 2018-05-12: 8 mg via INTRAVENOUS
  Filled 2018-05-12: qty 4

## 2018-05-12 NOTE — ED Notes (Signed)
Pt moved back to yellow zone for more treatment. Actively vomiting in there process. Pt has IV access and has fentanyl and zofran ordered.

## 2018-05-12 NOTE — ED Provider Notes (Signed)
  Physical Exam  BP (!) 143/91   Pulse 70   Temp 98.3 F (36.8 C) (Oral)   Resp 18   Ht 5\' 3"  (1.6 m)   Wt 70.3 kg   SpO2 99%   BMI 27.46 kg/m   Physical Exam  ED Course/Procedures     Procedures  MDM  Patient here with vomiting, epigastric pain. CT renal stone negative. WBC 16, lipase 60. Sign out pending RUQ [place].   9:52 AM RUQ US unremarkable. Pain improved, no vomiting now. Likely viral gastro. Of note, she has mild AKI with Cr 1.4 and was given IVF. Will dc home with zofran, bentyl for cramps.       Drenda Freeze, MD 05/12/18 [phone]

## 2018-05-12 NOTE — ED Notes (Signed)
Pt being taken to US.

## 2018-05-12 NOTE — ED Notes (Signed)
Pt given water per Dr. Sabra Heck

## 2018-05-12 NOTE — ED Notes (Signed)
Patient verbalizes understanding of discharge instructions. Opportunity for questioning and answers were provided. Pt discharged from ED. 

## 2018-05-12 NOTE — ED Notes (Signed)
Wasted 50 mcg fentanyl in yellow zone sharps w/ Norva Pavlov, RN

## 2018-05-12 NOTE — Discharge Instructions (Signed)
Stay hydrated.   Take zofran for nausea.   You likely have a stomach virus.   Take bentyl for cramps   See your doctor  Return to ER if you have worse abdominal pain, vomiting, fever.

## 2018-05-12 NOTE — ED Triage Notes (Signed)
BIB EMS from home, pt reports onset of CP/epigastric pain. 10/10. Pt in room flailing around.

## 2018-05-12 NOTE — ED Notes (Signed)
Family at bedside. 

## 2018-05-12 NOTE — ED Provider Notes (Signed)
[facility] EMERGENCY DEPARTMENT Provider Note   CSN: [id number] Arrival date & time: 05/12/18  0310     History   Chief Complaint Chief Complaint  Patient presents with  . Chest Pain    HPI Suzanne [name] is a 42 y.o. female.   Emesis   This is a new problem. The current episode started 3 to 5 hours ago. The problem occurs 2 to 4 times per day. The problem has been gradually worsening. The emesis has an appearance of stomach contents. There has been no fever. Associated symptoms include abdominal pain. Pertinent negatives include no chills, no diarrhea, no fever and no headaches.    Past Medical History:  Diagnosis Date  . Allergy   . Anemia   . Anxiety   . Arthritis   . Asthma   . Chest pain   . Chronic lower back pain   . Depression   . GERD (gastroesophageal reflux disease)   . Hypertension   . Migraine   . Neck pain, chronic   . Shortness of breath     Patient Active Problem List   Diagnosis Date Noted  . Murmur, cardiac 03/26/2018  . Smoker 03/26/2018  . History of hysterectomy 11/25/2016  . Chronic pain 08/31/2016  . Unintentional weight loss 08/17/2016  . B12 deficiency 08/12/2016  . Daily nausea 08/06/2016  . Microcytic anemia 08/06/2016  . Chronic diarrhea 08/06/2016  . Intestinal malabsorption 08/06/2016  . Lung nodule < 6cm on CT 08/06/2016  . Essential hypertension 07/02/2013  . Atypical chest pain 07/02/2013  . Shortness of breath 07/02/2013  . Other and unspecified ovarian cyst 04/01/2013  . Dysphagia 12/22/2011    Past Surgical History:  Procedure Laterality Date  . ESOPHAGEAL DILATION    . ESOPHAGEAL MANOMETRY N/A 04/01/2013   Procedure: ESOPHAGEAL MANOMETRY (EM);  Surgeon: Milus Banister, MD;  Location: [facility];  Service: Endoscopy;  Laterality: N/A;  . ESOPHAGOGASTRODUODENOSCOPY (EGD) WITH ESOPHAGEAL DILATION N/A 02/28/2013   Procedure: ESOPHAGOGASTRODUODENOSCOPY (EGD) WITH ESOPHAGEAL DILATION;  Surgeon:  Milus Banister, MD;  Location: [facility];  Service: Endoscopy;  Laterality: N/A;  . TUBAL LIGATION    . VAGINAL HYSTERECTOMY       OB History    Gravida  2   Para  2   Term      Preterm  2   AB      Living        SAB      TAB      Ectopic      Multiple      Live Births               Home Medications    Prior to Admission medications   Medication Sig Start Date End Date Taking? Authorizing Provider  albuterol (PROVENTIL) (2.5 MG/3ML) 0.083% nebulizer solution Take 3 mLs (2.5 mg total) by nebulization every 4 (four) hours as needed for wheezing or shortness of breath. 12/11/17   Orma Flaming, MD  amLODipine (NORVASC) 10 MG tablet Take 1 tablet (10 mg total) by mouth daily. 12/04/17   Briscoe Deutscher, DO  carvedilol (COREG) 3.125 MG tablet Take 3.125mg  every evening for 1 week, then increase to 3.125mg  twice daily 02/12/18   Lorretta Harp, MD  hydrALAZINE (APRESOLINE) 25 MG tablet Take 1 tablet (25 mg total) by mouth 2 (two) times daily. 03/26/18   Erlene Quan, PA-C  hydrochlorothiazide (HYDRODIURIL) 25 MG tablet Take 1 tablet (25 mg total) by  mouth daily. 12/11/17   Orma Flaming, MD  meloxicam (MOBIC) 15 MG tablet Take 1 tablet (15 mg total) by mouth daily. 12/04/17   Briscoe Deutscher, DO  Multiple Vitamins-Calcium (ONE-A-DAY WOMENS FORMULA PO) Take 1 tablet by mouth daily.    [provider]  omeprazole (PRILOSEC) 20 MG capsule Take 1 capsule (20 mg total) by mouth daily. 11/03/17   Briscoe Deutscher, DO  tizanidine (ZANAFLEX) 2 MG capsule Take 1 capsule (2 mg total) by mouth 2 (two) times daily as needed for muscle spasms. 01/01/18   Patel, Arvin Collard K, DO  traMADol (ULTRAM) 50 MG tablet Take 1 tablet (50 mg total) by mouth every 6 (six) hours as needed. 04/20/18   Inda Coke, PA    Family History Family History  Problem Relation Age of Onset  . Heart disease Father        H/O CABG  . Hypertension Mother   . Hyperlipidemia Maternal Grandmother   .  Hypertension Maternal Grandmother   . Hypertension Paternal Grandmother   . Diabetes Paternal Grandmother   . Cancer - Lung Paternal Grandmother   . Breast cancer Neg Hx     Social History Social History   Tobacco Use  . Smoking status: Former Smoker    Packs/day: 0.10    Years: 10.00    Pack years: 1.00    Types: Cigars    Last attempt to quit: 12/06/2017    Years since quitting: 0.4  . Smokeless tobacco: Never Used  . Tobacco comment: smokes 2 Black & Milds per day 08/19/13  Substance Use Topics  . Alcohol use: Yes    Comment: 1-2 times yearly  . Drug use: No     Allergies   Reglan [metoclopramide]; Lisinopril; Losartan; Sulfa antibiotics; Amoxicillin; and Contrast media [iodinated diagnostic agents]   Review of Systems Review of Systems  Constitutional: Negative for chills and fever.  Gastrointestinal: Positive for abdominal pain and vomiting. Negative for diarrhea.  Neurological: Negative for headaches.  All other systems reviewed and are negative.    Physical Exam Updated Vital Signs BP (!) 166/98 (BP Location: Right Arm)   Pulse 87   Temp 98.3 F (36.8 C) (Oral)   Resp 17   Ht 5\' 3"  (1.6 m)   Wt 70.3 kg   SpO2 100%   BMI 27.46 kg/m   Physical Exam Vitals signs and nursing note reviewed.  Constitutional:      Appearance: She is well-developed.  HENT:     Head: Normocephalic and atraumatic.  Neck:     Musculoskeletal: Normal range of motion.  Cardiovascular:     Rate and Rhythm: Normal rate and regular rhythm.  Pulmonary:     Effort: Pulmonary effort is normal. No respiratory distress.     Breath sounds: No stridor. No decreased breath sounds.  Abdominal:     General: There is no distension.     Tenderness: There is abdominal tenderness (diffusely).  Neurological:     Mental Status: She is alert.      ED Treatments / Results  Labs (all labs ordered are listed, but only abnormal results are displayed) Labs Reviewed  CBC WITH  DIFFERENTIAL/PLATELET - Abnormal; Notable for the following components:      Result Value   WBC 16.5 (*)    RBC 5.46 (*)    MCV 73.4 (*)    MCH 23.3 (*)    RDW 15.6 (*)    Neutro Abs 13.1 (*)    Monocytes Absolute 1.6 (*)  Abs Immature Granulocytes 0.08 (*)    All other components within normal limits  COMPREHENSIVE METABOLIC PANEL - Abnormal; Notable for the following components:   Potassium 3.2 (*)    Glucose, Bld 123 (*)    Creatinine, Ser 1.39 (*)    GFR calc non Af Amer 47 (*)    GFR calc Af Amer 54 (*)    Anion gap 18 (*)    All other components within normal limits  LIPASE, BLOOD - Abnormal; Notable for the following components:   Lipase 60 (*)    All other components within normal limits  URINALYSIS, ROUTINE W REFLEX MICROSCOPIC - Abnormal; Notable for the following components:   Color, Urine STRAW (*)    All other components within normal limits  POC URINE PREG, ED    EKG None  Radiology No results found.  Procedures Procedures (including critical care time)  Medications Ordered in ED Medications  lactated ringers bolus 1,000 mL (1,000 mLs Intravenous New Bag/Given 05/12/18 0543)  ondansetron (ZOFRAN) 8 mg in sodium chloride 0.9 % 50 mL IVPB (has no administration in time range)  alum & mag hydroxide-simeth (MAALOX/MYLANTA) 200-200-20 MG/5ML suspension 30 mL (30 mLs Oral Given 05/12/18 0342)    And  lidocaine (XYLOCAINE) 2 % viscous mouth solution 15 mL (15 mLs Oral Given 05/12/18 0341)  promethazine (PHENERGAN) injection 12.5 mg (12.5 mg Intramuscular Given 05/12/18 0342)     Initial Impression / Assessment and Plan / ED Course  I have reviewed the triage vital signs and the nursing notes.  Pertinent labs & imaging results that were available during my care of the patient were reviewed by me and considered in my medical decision making (see chart for details).     Patient with nausea vomiting and epigastric abdominal pain.  States this is been going  on for the last few hours.  Not ill looking breathing down.  Only stomach contents and mucus, not.  No fevers.  Started having some chest pain after vomiting multiple times. Exam as above. Concern for possible pancreatitis versus cholecystitis versus colitis or enteritis.  Will treat symptomatically, fluids and labs. leukocytosis, lipase slightly elevated, potassium slightly low still not tolerating p.o. we will get a CT scan.  Ct ok. Pain still there. Not entirely explained by lipase. meds given, not imroved. Will add on [place] for GB? Continue symptomatic contro.   Care transferred pending reeval and [place] results. Suspect discharge if her symptoms are improved and [place] negative.   Final Clinical Impressions(s) / ED Diagnoses   Final diagnoses:  None    ED Discharge Orders    None       Chandler Swiderski, Corene Cornea, MD 05/12/18 2133

## 2018-05-15 DIAGNOSIS — B0052 Herpesviral keratitis: Secondary | ICD-10-CM | POA: Diagnosis not present

## 2018-05-21 ENCOUNTER — Ambulatory Visit: Payer: BLUE CROSS/BLUE SHIELD | Admitting: Obstetrics and Gynecology

## 2018-05-21 DIAGNOSIS — B0052 Herpesviral keratitis: Secondary | ICD-10-CM | POA: Diagnosis not present

## 2018-05-29 DIAGNOSIS — B0052 Herpesviral keratitis: Secondary | ICD-10-CM | POA: Diagnosis not present

## 2018-06-05 DIAGNOSIS — H16111 Macular keratitis, right eye: Secondary | ICD-10-CM | POA: Diagnosis not present

## 2018-06-08 DIAGNOSIS — B0052 Herpesviral keratitis: Secondary | ICD-10-CM | POA: Diagnosis not present

## 2018-06-11 DIAGNOSIS — B0052 Herpesviral keratitis: Secondary | ICD-10-CM | POA: Diagnosis not present

## 2018-06-15 DIAGNOSIS — B0052 Herpesviral keratitis: Secondary | ICD-10-CM | POA: Diagnosis not present

## 2018-06-22 DIAGNOSIS — B0052 Herpesviral keratitis: Secondary | ICD-10-CM | POA: Diagnosis not present

## 2018-06-29 DIAGNOSIS — H16111 Macular keratitis, right eye: Secondary | ICD-10-CM | POA: Diagnosis not present

## 2018-06-29 DIAGNOSIS — B0052 Herpesviral keratitis: Secondary | ICD-10-CM | POA: Diagnosis not present

## 2018-07-09 ENCOUNTER — Ambulatory Visit: Payer: BLUE CROSS/BLUE SHIELD | Admitting: Neurology

## 2018-07-09 NOTE — Progress Notes (Deleted)
Follow-up Visit   Date: 07/09/18    Suzanne [name] MRN: [id number] DOB: [birth year]   Interim History: Suzanne [name] is a 43 y.o. right-handed African American female with hypertension, GERD, tobacco user, and chronic low back pain returning to the clinic with left arm pain and weakness ***.  The patient was accompanied to the clinic by self.  History of present illness: Initial visit for falls and back pain 12/24/2015:  She moved from [place] in 2011 and about a year later, she recalls having her first fall.  Since this time, she continues to have unprocoked falls about twice per year.  Initially, she attributed it to slipping on the floor or missteping, but since symptoms have increased, she was referred for further evaluation.  Her falls have occurred as follows: (1) first fall occurred while getting into the tub and her legs buckled, she had to sit for a while and then was able to stand, (2) she was pushing a cart at work and again suddenly her legs gave out, no associated pain (3) fell in the kitchen (4) another time she fell while getting out of the shower during which time she did have back pain, (5) in March 2017, she was standing on the porch talking to her mother and suddenly collapsed because her legs again buckled.   There was no associated back or leg pain.  She has occasion tingling of the toes on the left.  She is able to move her legs immediately following a falls and usually takes about 10-minutes for her to stand up again, which is due to pain.   She has chronic neck and back pain and has seen Pain Management for ESI and tried physical therapy.    UPDATE 08/12/2016:  She returns for follow-up visit and complains of whole body pain, worse neck, hands, and legs.  Pain is sharp down her legs.  She has tingling in her fingers. Her cervical and lumbar imaging does not disclose nerve impingement.  She has a lot of tenderness over the muscles and has been diagnosed with fibromyalgia  in the past.  She tried several medications and saw pain management, but is not taking any medications for fibromyalgia currently.  She had lost insurance a few months ago, and was unable to continue her vitamin B12 injections.  She is also being evaluated for unintentional weight loss and has a colonoscopy coming up.  UPDATE 01/01/2018:  She is here for follow-up visit with similar complains of left sided numbness/tingling, especially involving the arm.  She has difficulty holding objects.  She initially thought it was due to working at a computer a lot.  Flexeril and tylenol provided some relief, however, she feels that she is having to take more of it to get relief.  NCS/EMG of the left side in 2018 was normal.  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  She tragically lost her son in a house fire in April and is grieving his loss.   UPDATE 07/09/2018:  ***   Medications:  Current Outpatient Medications on File Prior to Visit  Medication Sig Dispense Refill  . albuterol (PROVENTIL) (2.5 MG/3ML) 0.083% nebulizer solution Take 3 mLs (2.5 mg total) by nebulization every 4 (four) hours as needed for wheezing or shortness of breath. 150 mL 1  . amLODipine (NORVASC) 10 MG tablet Take 1 tablet (10 mg total) by mouth daily. 90 tablet 2  .  carvedilol (COREG) 3.125 MG tablet Take 3.'125mg'$  every evening for 1 week, then increase to 3.'125mg'$  twice daily (Patient not taking: Reported on 05/12/2018) 60 tablet 0  . cephALEXin (KEFLEX) 500 MG capsule Take 500 mg by mouth 2 (two) times daily.    Marland Kitchen dicyclomine (BENTYL) 20 MG tablet Take 1 tablet (20 mg total) by mouth 2 (two) times daily. 10 tablet 0  . ferrous sulfate (IRON SUPPLEMENT) 325 (65 FE) MG tablet Take 325 mg by mouth daily with breakfast.    . hydrALAZINE (APRESOLINE) 25 MG tablet Take 1 tablet (25 mg total) by mouth 2 (two) times daily. 180 tablet 3  . hydrochlorothiazide (HYDRODIURIL) 25 MG  tablet Take 1 tablet (25 mg total) by mouth daily. 90 tablet 1  . meloxicam (MOBIC) 15 MG tablet Take 1 tablet (15 mg total) by mouth daily. 30 tablet 0  . Multiple Vitamins-Calcium (ONE-A-DAY WOMENS FORMULA PO) Take 1 tablet by mouth daily.    Marland Kitchen neomycin-polymyxin-dexameth (MAXITROL) 0.1 % OINT Place 1 application into the right eye at bedtime.    Marland Kitchen omeprazole (PRILOSEC) 20 MG capsule Take 1 capsule (20 mg total) by mouth daily. 30 capsule 3  . ondansetron (ZOFRAN ODT) 4 MG disintegrating tablet '4mg'$  ODT q4 hours prn nausea/vomit 10 tablet 0  . prednisoLONE acetate (PRED FORTE) 1 % ophthalmic suspension 1 drop 4 (four) times daily.    . tizanidine (ZANAFLEX) 2 MG capsule Take 1 capsule (2 mg total) by mouth 2 (two) times daily as needed for muscle spasms. 60 capsule 2  . traMADol (ULTRAM) 50 MG tablet Take 1 tablet (50 mg total) by mouth every 6 (six) hours as needed. (Patient not taking: Reported on 05/12/2018) 20 tablet 0  . trifluridine (VIROPTIC) 1 % ophthalmic solution Place 1 drop into the right eye 5 (five) times daily.    . valACYclovir (VALTREX) 1000 MG tablet Take 1,000 mg by mouth 3 (three) times daily.     No current facility-administered medications on file prior to visit.     Allergies:  Allergies  Allergen Reactions  . Reglan [Metoclopramide] Shortness Of Breath  . Lisinopril Cough  . Losartan Cough  . Sulfa Antibiotics Nausea And Vomiting  . Amoxicillin Rash       . Contrast Media [Iodinated Diagnostic Agents] Rash    Review of Systems:  CONSTITUTIONAL: No fevers, chills, night sweats, +weight loss.  EYES: No visual changes or eye pain ENT: No hearing changes.  No history of nose bleeds.   RESPIRATORY: No cough, wheezing and shortness of breath.   CARDIOVASCULAR: Negative for chest pain, and palpitations.   GI: Negative for abdominal discomfort, blood in stools or black stools.  No recent change in bowel habits.   GU:  No history of incontinence.   MUSCLOSKELETAL:  No history of joint pain or swelling.  +myalgias.   SKIN: Negative for lesions, rash, and itching.   ENDOCRINE: Negative for cold or heat intolerance, polydipsia or goiter.   PSYCH:  No depression or anxiety symptoms.   NEURO: As Above.   Vital Signs:  There were no vitals taken for this visit.  General Medical Exam:   General:  Well appearing, comfortable  Eyes/ENT: see cranial nerve examination.   Neck: No masses appreciated.  Full range of motion without tenderness.  No carotid bruits. Respiratory:  Clear to auscultation, good air entry bilaterally.   Cardiac:  Regular rate and rhythm, no murmur.   Ext:  ***   Neurological Exam: MENTAL STATUS including  orientation to time, place, person, recent and remote memory, attention span and concentration, language, and fund of knowledge is normal.  Speech is not dysarthric.  CRANIAL NERVES: No visual field defects. Pupils equal round and reactive to light.  Normal conjugate, extra-ocular eye movements in all directions of gaze.  No ptosis. Face is symmetric. Palate elevates symmetrically.  Tongue is midline.  MOTOR:  Motor strength is 5/5 in all extremities  No atrophy, fasciculations or abnormal movements.  No pronator drift.  Tone is normal.  She has localized tenderness over the cervical and shoulder muscles to palpation  MSRs:  Reflexes are 2+/4 throughout.  SENSORY:  Intact to vibration, temperature, and pin prick.  Prayer testing negative.  Negative Tinel's sign at the wrist.  COORDINATION/GAIT:    Gait narrow based and stable.    Data: Labs 12/24/2015:  Vitamin B12 205*, TSH 0.84, CRP 0.3, ANA neg, ESR 25, folate 9.3  MRI cervical spine wwo contrast 02/20/2016:   1. Small central disc extrusion at C5-6 with mild spinal stenosis and mild spinal cord mass effect. 2. Mild bilateral foraminal stenosis and borderline spinal stenosis at C4-5. 3. Mild-to-moderate left foraminal stenosis at C3-4 due to uncovertebral disease.  MRI  brain wo contrast 06/14/2011:  Normal  MRI cervical spine wo contrast 06/14/2011:  Mild posterior central disc protrusions at C4-5 and C5-6.  No spinal or foraminal stenosis. MRI lumbar spine 06/14/2011:  L4-5 disc bulge and facet hypertrophy with moderate biforaminal stenosis; L5-S1 disc bulge with no spinal or foraminal stenosis.  NCS/EMG of the left side 09/11/2016: This is a normal study of the left side.  In particular, there is no evidence of a generalized sensorimotor polyneuropathy, diffuse myopathy, cervical/lumbosacral radiculopathy, or carpal tunnel syndrome affecting the left side.  IMPRESSION: 1.  Cervicalgia with localized muscle tenderness, likely contributed to recent life stressors (tragic loss of her son).  Start neck physiotherapy.  Start tizanidine '2mg'$  twice daily as needed.  I also urged her to consider seeking grief counseling.     2.  Left arm paresthesias, possible due to temporary nerve compression from positioning (hyperflexion of wrist/elbow). Her NCS/EMG in 2018 was normal and did show nerve pathology.  Recommend treating conservatively with using wrist splint and sleeping with arms extended.  If symptoms do not improve, repeat EDX can be performed.  ***  Thank you for allowing me to participate in patient's care.  If I can answer any additional questions, I would be pleased to do so.    Sincerely,    Zamya Culhane K. Posey Pronto, DO

## 2018-07-16 ENCOUNTER — Telehealth: Payer: Self-pay | Admitting: Family Medicine

## 2018-07-16 ENCOUNTER — Other Ambulatory Visit: Payer: Self-pay | Admitting: Family Medicine

## 2018-07-16 DIAGNOSIS — Z1231 Encounter for screening mammogram for malignant neoplasm of breast: Secondary | ICD-10-CM

## 2018-07-16 DIAGNOSIS — H16111 Macular keratitis, right eye: Secondary | ICD-10-CM | POA: Diagnosis not present

## 2018-07-16 NOTE — Telephone Encounter (Signed)
See note  Copied from [place] [id number]. Topic: Referral - Request for Referral >> Jul 16, 2018 11:41 AM Reyne Dumas L wrote: Has patient seen PCP for this complaint? no *If NO, is insurance requiring patient see PCP for this issue before PCP can refer them? Referral for which specialty: mammography Preferred provider/office: [facility] Reason for referral: discharge out of both breasts and she would like to have mammogram done.  Pt can be reached at [phone].  Tried [facility] no answer.

## 2018-07-16 NOTE — Telephone Encounter (Signed)
Called patient she has app on Wednesday will have addressed at that time.   She has had green discharge from both breast that can be manually extracted as well. She only has pain around nipple area. She has no other changes in breast. She denies any dimpling of skin redness or swelling. She and normal mammogram in 2018.

## 2018-07-18 ENCOUNTER — Other Ambulatory Visit (HOSPITAL_COMMUNITY)
Admission: RE | Admit: 2018-07-18 | Discharge: 2018-07-18 | Disposition: A | Discharge status: Home / Self Care | Payer: BLUE CROSS/BLUE SHIELD | Source: Ambulatory Visit | Attending: Family Medicine | Admitting: Family Medicine

## 2018-07-18 ENCOUNTER — Ambulatory Visit (INDEPENDENT_AMBULATORY_CARE_PROVIDER_SITE_OTHER): Payer: BLUE CROSS/BLUE SHIELD | Admitting: Family Medicine

## 2018-07-18 ENCOUNTER — Encounter: Payer: Self-pay | Admitting: Family Medicine

## 2018-07-18 VITALS — BP 118/62 | HR 61 | Temp 98.6°F | Ht 64.0 in | Wt 159.0 lb

## 2018-07-18 DIAGNOSIS — H16111 Macular keratitis, right eye: Secondary | ICD-10-CM

## 2018-07-18 DIAGNOSIS — R634 Abnormal weight loss: Secondary | ICD-10-CM

## 2018-07-18 DIAGNOSIS — R7989 Other specified abnormal findings of blood chemistry: Secondary | ICD-10-CM

## 2018-07-18 DIAGNOSIS — K909 Intestinal malabsorption, unspecified: Secondary | ICD-10-CM

## 2018-07-18 DIAGNOSIS — N898 Other specified noninflammatory disorders of vagina: Secondary | ICD-10-CM | POA: Diagnosis not present

## 2018-07-18 DIAGNOSIS — D509 Iron deficiency anemia, unspecified: Secondary | ICD-10-CM

## 2018-07-18 DIAGNOSIS — E440 Moderate protein-calorie malnutrition: Secondary | ICD-10-CM

## 2018-07-18 DIAGNOSIS — B9689 Other specified bacterial agents as the cause of diseases classified elsewhere: Secondary | ICD-10-CM

## 2018-07-18 DIAGNOSIS — N643 Galactorrhea not associated with childbirth: Secondary | ICD-10-CM

## 2018-07-18 DIAGNOSIS — N760 Acute vaginitis: Secondary | ICD-10-CM

## 2018-07-18 LAB — COMPREHENSIVE METABOLIC PANEL
ALT: 8 U/L (ref 0–35)
AST: 11 U/L (ref 0–37)
Albumin: 3.3 g/dL — ABNORMAL LOW (ref 3.5–5.2)
Alkaline Phosphatase: 53 U/L (ref 39–117)
BUN: 15 mg/dL (ref 6–23)
CO2: 29 mEq/L (ref 19–32)
Calcium: 8.4 mg/dL (ref 8.4–10.5)
Chloride: 108 mEq/L (ref 96–112)
Creatinine, Ser: 0.65 mg/dL (ref 0.40–1.20)
GFR: 120.39 mL/min (ref >60.00)
Glucose, Bld: 80 mg/dL (ref 70–99)
Potassium: 3.8 mEq/L (ref 3.5–5.1)
Sodium: 143 mEq/L (ref 135–145)
Total Bilirubin: 0.2 mg/dL (ref 0.2–1.2)
Total Protein: 5.6 g/dL — ABNORMAL LOW (ref 6.0–8.3)

## 2018-07-18 LAB — CBC WITH DIFFERENTIAL/PLATELET
Basophils Absolute: 0 10*3/uL (ref 0.0–0.1)
Basophils Relative: 0.3 % (ref 0.0–3.0)
Eosinophils Absolute: 0.3 10*3/uL (ref 0.0–0.7)
Eosinophils Relative: 4.6 % (ref 0.0–5.0)
HCT: 30.7 % — ABNORMAL LOW (ref 36.0–46.0)
Hemoglobin: 9.7 g/dL — ABNORMAL LOW (ref 12.0–15.0)
Lymphocytes Relative: 28 % (ref 12.0–46.0)
Lymphs Abs: 1.9 10*3/uL (ref 0.7–4.0)
MCHC: 31.7 g/dL (ref 30.0–36.0)
MCV: 77.8 fl — ABNORMAL LOW (ref 78.0–100.0)
Monocytes Absolute: 0.6 10*3/uL (ref 0.1–1.0)
Monocytes Relative: 9.6 % (ref 3.0–12.0)
Neutro Abs: 3.8 10*3/uL (ref 1.4–7.7)
Neutrophils Relative %: 57.5 % (ref 43.0–77.0)
Platelets: 216 10*3/uL (ref 150.0–400.0)
RBC: 3.94 Mil/uL (ref 3.87–5.11)
RDW: 17.4 % — ABNORMAL HIGH (ref 11.5–15.5)
WBC: 6.7 10*3/uL (ref 4.0–10.5)

## 2018-07-18 LAB — TSH: TSH: 0.2 u[IU]/mL — ABNORMAL LOW (ref 0.35–4.50)

## 2018-07-18 NOTE — Patient Instructions (Addendum)
You have an appointment scheduled for: []   2D Mammogram  [x]   3D Mammogram  []   Bone Density   On   07/24/2018        At 8:40   Your appointment will at the following location  [x]   [facility]      [place], [place]         []   [facility]  [place], [place]   Make sure to wear two peace clothing  No lotions powders or deodorants the day of the appointment Make sure to bring picture ID and insurance card.  Bring list of medications you are currently taking including any supplements.

## 2018-07-18 NOTE — Progress Notes (Signed)
Suzanne [name] is a 43 y.o. female here for an acute visit.  History of Present Illness:   Lonell Grandchild, CMA acting as scribe for Dr. Briscoe Deutscher.   Vaginal Discharge  The patient's primary symptoms include vaginal discharge. This is a new problem. The current episode started in the past 7 days. The problem occurs constantly. The problem has been unchanged. The patient is experiencing no pain. She is not pregnant. The vaginal discharge was clear and white. There has been no bleeding. She has not been passing clots. She has not been passing tissue. Nothing aggravates the symptoms. She has tried nothing for the symptoms.   She has had green discharge from both breasts that can be manually extracted as well. She only has pain around nipple area. She has no other changes in breast. She denies any dimpling of skin redness or swelling. She and normal mammogram in 2018.  PMHx, SurgHx, SocialHx, Medications, and Allergies were reviewed in the Visit Navigator and updated as appropriate.  Current Medications   .  albuterol (PROVENTIL) (2.5 MG/3ML) 0.083% nebulizer solution, Take 3 mLs (2.5 mg total) by nebulization every 4 (four) hours as needed for wheezing or shortness of breath., Disp: 150 mL, Rfl: 1 .  amLODipine (NORVASC) 10 MG tablet, Take 1 tablet (10 mg total) by mouth daily., Disp: 90 tablet, Rfl: 2 .  carvedilol (COREG) 3.125 MG tablet, Take 3.125mg  every evening for 1 week, then increase to 3.125mg  twice daily (Patient not taking: Reported on 05/12/2018), Disp: 60 tablet, Rfl: 0 .  dicyclomine (BENTYL) 20 MG tablet, Take 1 tablet (20 mg total) by mouth 2 (two) times daily., Disp: 10 tablet, Rfl: 0 .  ferrous sulfate (IRON SUPPLEMENT) 325 (65 FE) MG tablet, Take 325 mg by mouth daily with breakfast., Disp: , Rfl:  .  hydrALAZINE (APRESOLINE) 25 MG tablet, Take 1 tablet (25 mg total) by mouth 2 (two) times daily., Disp: 180 tablet, Rfl: 3 .  hydrochlorothiazide (HYDRODIURIL) 25 MG  tablet, Take 1 tablet (25 mg total) by mouth daily., Disp: 90 tablet, Rfl: 1 .  meloxicam (MOBIC) 15 MG tablet, Take 1 tablet (15 mg total) by mouth daily., Disp: 30 tablet, Rfl: 0 .  Multiple Vitamins-Calcium (ONE-A-DAY WOMENS FORMULA PO), Take 1 tablet by mouth daily., Disp: , Rfl:  .  neomycin-polymyxin-dexameth (MAXITROL) 0.1 % OINT, Place 1 application into the right eye at bedtime., Disp: , Rfl:  .  omeprazole (PRILOSEC) 20 MG capsule, Take 1 capsule (20 mg total) by mouth daily., Disp: 30 capsule, Rfl: 3 .  ondansetron (ZOFRAN ODT) 4 MG disintegrating tablet, 4mg  ODT q4 hours prn nausea/vomit, Disp: 10 tablet, Rfl: 0 .  prednisoLONE acetate (PRED FORTE) 1 % ophthalmic suspension, 1 drop 4 (four) times daily., Disp: , Rfl:  .  tizanidine (ZANAFLEX) 2 MG capsule, Take 1 capsule (2 mg total) by mouth 2 (two) times daily as needed for muscle spasms., Disp: 60 capsule, Rfl: 2 .  trifluridine (VIROPTIC) 1 % ophthalmic solution, Place 1 drop into the right eye 5 (five) times daily., Disp: , Rfl:  .  valACYclovir (VALTREX) 1000 MG tablet, Take 1,000 mg by mouth 3 (three) times daily., Disp: , Rfl:    Allergies  Allergen Reactions  . Reglan [Metoclopramide] Shortness Of Breath  . Lisinopril Cough  . Losartan Cough  . Sulfa Antibiotics Nausea And Vomiting  . Amoxicillin Rash    Has patient had a PCN reaction causing immediate rash, facial/tongue/throat swelling, SOB or lightheadedness with  hypotension: Yes Has patient had a PCN reaction causing severe rash involving mucus membranes or skin necrosis: Broke out face & buttocks Has patient had a PCN reaction that required hospitalization: No Has patient had a PCN reaction occurring within the last 10 years: No If all of the above answers are "NO", then may proceed with Cephalosporin use  . Contrast Media [Iodinated Diagnostic Agents] Rash   Review of Systems   Pertinent items are noted in the HPI. Otherwise, ROS is negative.  Vitals    Vitals:   07/18/18 0913  BP: 118/62  Pulse: 61  Temp: 98.6 F (37 C)  TempSrc: Oral  SpO2: 100%  Weight: 159 lb (72.1 kg)  Height: 5\' 4"  (1.626 m)     Body mass index is 27.29 kg/m.  Physical Exam   Physical Exam Vitals signs and nursing note reviewed.  HENT:     Head: Normocephalic and atraumatic.  Eyes:     Pupils: Pupils are equal, round, and reactive to light.  Neck:     Musculoskeletal: Normal range of motion and neck supple.  Cardiovascular:     Rate and Rhythm: Normal rate and regular rhythm.     Heart sounds: Normal heart sounds.  Pulmonary:     Effort: Pulmonary effort is normal.  Abdominal:     Palpations: Abdomen is soft.  Genitourinary:    Labia:        Left: No rash.      Vagina: Vaginal discharge present. No erythema or bleeding.     Cervix: No friability or lesion.     Uterus: Absent.   Skin:    General: Skin is warm.  Psychiatric:        Behavior: Behavior normal.      Results for orders placed or performed in visit on 07/18/18  Anaerobic and Aerobic Culture  Result Value Ref Range   MICRO NUMBER: [id number]    SPECIMEN QUALITY: Adequate    Source: NIPPLE DISCHARGE    STATUS: FINAL    GRAM STAIN: No organisms or white blood cells seen    ANA RESULT: No anaerobes isolated.    MICRO NUMBER: [id number]    SPECIMEN QUALITY: Adequate    SOURCE: NIPPLE DISCHARGE    STATUS: FINAL    AER RESULT:      Growth of skin flora (note: Growth does not include S. aureus, beta-hemolytic Streptococci or P. aeruginosa).  CBC with Differential/Platelet  Result Value Ref Range   WBC 6.7 4.0 - 10.5 K/uL   RBC 3.94 3.87 - 5.11 Mil/uL   Hemoglobin 9.7 (L) 12.0 - 15.0 g/dL   HCT 30.7 (L) 36.0 - 46.0 %   MCV 77.8 (L) 78.0 - 100.0 fl   MCHC 31.7 30.0 - 36.0 g/dL   RDW 17.4 (H) 11.5 - 15.5 %   Platelets 216.0 150.0 - 400.0 K/uL   Neutrophils Relative % 57.5 43.0 - 77.0 %   Lymphocytes Relative 28.0 12.0 - 46.0 %   Monocytes Relative 9.6 3.0 - 12.0 %    Eosinophils Relative 4.6 0.0 - 5.0 %   Basophils Relative 0.3 0.0 - 3.0 %   Neutro Abs 3.8 1.4 - 7.7 K/uL   Lymphs Abs 1.9 0.7 - 4.0 K/uL   Monocytes Absolute 0.6 0.1 - 1.0 K/uL   Eosinophils Absolute 0.3 0.0 - 0.7 K/uL   Basophils Absolute 0.0 0.0 - 0.1 K/uL  Comprehensive metabolic panel  Result Value Ref Range   Sodium 143 135 - 145 mEq/L  Potassium 3.8 3.5 - 5.1 mEq/L   Chloride 108 96 - 112 mEq/L   CO2 29 19 - 32 mEq/L   Glucose, Bld 80 70 - 99 mg/dL   BUN 15 6 - 23 mg/dL   Creatinine, Ser 0.65 0.40 - 1.20 mg/dL   Total Bilirubin 0.2 0.2 - 1.2 mg/dL   Alkaline Phosphatase 53 39 - 117 U/L   AST 11 0 - 37 U/L   ALT 8 0 - 35 U/L   Total Protein 5.6 (L) 6.0 - 8.3 g/dL   Albumin 3.3 (L) 3.5 - 5.2 g/dL   Calcium 8.4 8.4 - 10.5 mg/dL   GFR 120.39 >60.00 mL/min  Iron, TIBC and Ferritin Panel  Result Value Ref Range   Iron 40 40 - 190 mcg/dL   TIBC 209 (L) 250 - 450 mcg/dL (calc)   %SAT 19 16 - 45 % (calc)   Ferritin 61 16 - 232 ng/mL  TSH  Result Value Ref Range   TSH 0.20 (L) 0.35 - 4.50 uIU/mL  Prolactin  Result Value Ref Range   Prolactin 11.9 ng/mL  TEST IN QUESTION AMBIGUOUS ORDER  Result Value Ref Range   QUESTION/PROBLEM     UNCLEAR ORDER: VERIFY ALL TEST FOR SPECIMENS REC'D    SPECIMEN(S) SUBMITTED ES/SWAB   Cervicovaginal ancillary only( [place])  Result Value Ref Range   Chlamydia Negative    Neisseria gonorrhea Negative    Bacterial vaginitis **POSITIVE for Gardnerella vaginalis** (A)    Candida vaginitis Negative for Candida species    Trichomonas Negative    Mm Diag Breast Tomo Bilateral  Result Date: 07/24/2018 CLINICAL DATA:  43 year old female presenting for bilateral spontaneous nipple discharge. The patient states that she has always had nipple discharge, but within the last 3-4 months, but has changed to a black or green color. EXAM: DIGITAL DIAGNOSTIC BILATERAL MAMMOGRAM WITH CAD AND TOMO COMPARISON:  Previous exam(s). ACR Breast Density  Category c: The breast tissue is heterogeneously dense, which may obscure small masses. FINDINGS: No suspicious calcifications, masses or areas of distortion are seen in the bilateral breasts. Mammographic images were processed with CAD. On physical exam, multi duct yellowish nipple discharge can be expressed from the breasts bilaterally. IMPRESSION: 1. There is bilateral spontaneous multi duct nipple discharge, which is a benign finding. 2.  No mammographic evidence of malignancy in the bilateral breasts. RECOMMENDATION: 1. Further management of nipple discharge should be based on clinical assessment. This is felt to be physiologic/related to a benign process given that it is from multiple ducts and yellow in color. The patient was advised to alert her doctor if she experiences spontaneous unilateral bloody or clear nipple discharge from a single duct only. 2.  Screening mammogram in one year.(Code:SM-B-01Y) I have discussed the findings and recommendations with the patient. Results were also provided in writing at the conclusion of the visit. If applicable, a reminder letter will be sent to the patient regarding the next appointment. BI-RADS CATEGORY  1: Negative. Electronically Signed   By: Ammie Ferrier M.D.   On: 07/24/2018 09:47   Assessment and Plan   Lya was seen today for vaginal discharge and breast problem.  Diagnoses and all orders for this visit:  Galactorrhea Comments: Likely physiologic. Will monitor. Orders: -     CBC with Differential/Platelet -     Comprehensive metabolic panel -     Prolactin -     MM DIAG BREAST TOMO BILATERAL; Future -     Cancel: US  BREAST LTD UNI LEFT INC AXILLA; Future -     Cancel: US BREAST LTD UNI RIGHT INC AXILLA; Future -     Anaerobic and Aerobic Culture -     Pathology -     TEST IN QUESTION AMBIGUOUS ORDER  Vaginal discharge -     Cervicovaginal ancillary only( [place])  Microcytic anemia Comments: Possible malabsorption,  predisposition. Will ask Hematology for help. I worry that there is a syndrome or single cause for her issues that I cannot identify.  Orders: -     Iron, TIBC and Ferritin Panel -     Ambulatory referral to Hematology  Unintentional weight loss Comments: 214 - 159 over a few years.  Orders: -     TSH -     Ambulatory referral to Hematology  Abnormal TSH Comments: Will send back to Endocrinology. Orders: -     TSH -     Ambulatory referral to Endocrinology  Malnutrition of moderate degree (HCC)  BV (bacterial vaginosis) Comments: Flagyl sent to pharmacy. Orders: -     Discontinue: metroNIDAZOLE (FLAGYL) 500 MG tablet; Take 1 tablet (500 mg total) by mouth 3 (three) times daily. -     metroNIDAZOLE (FLAGYL) 500 MG tablet; Take 1 tablet (500 mg total) by mouth 2 (two) times daily.  Intestinal malabsorption, unspecified type  Nummular keratitis of right eye, followed by [facility]    . Reviewed expectations re: course of current medical issues. . Discussed self-management of symptoms. . Outlined signs and symptoms indicating need for more acute intervention. . Patient verbalized understanding and all questions were answered. Marland Kitchen Health Maintenance issues including appropriate healthy diet, exercise, and smoking avoidance were discussed with patient. . See orders for this visit as documented in the electronic medical record. . Patient received an After Visit Summary.  CMA served as Education administrator during this visit. History, Physical, and Plan performed by medical provider. The above documentation has been reviewed and is accurate and complete. Briscoe Deutscher, D.O.  Briscoe Deutscher, DO [place], [place] [facility] 07/29/2018

## 2018-07-19 DIAGNOSIS — R7989 Other specified abnormal findings of blood chemistry: Secondary | ICD-10-CM | POA: Insufficient documentation

## 2018-07-19 DIAGNOSIS — N643 Galactorrhea not associated with childbirth: Secondary | ICD-10-CM | POA: Insufficient documentation

## 2018-07-19 DIAGNOSIS — H16111 Macular keratitis, right eye: Secondary | ICD-10-CM | POA: Insufficient documentation

## 2018-07-19 DIAGNOSIS — I700 Atherosclerosis of aorta: Secondary | ICD-10-CM | POA: Insufficient documentation

## 2018-07-19 DIAGNOSIS — E440 Moderate protein-calorie malnutrition: Secondary | ICD-10-CM | POA: Insufficient documentation

## 2018-07-19 LAB — CERVICOVAGINAL ANCILLARY ONLY
Bacterial vaginitis: POSITIVE — AB
Candida vaginitis: NEGATIVE
Chlamydia: NEGATIVE
Neisseria Gonorrhea: NEGATIVE
Trichomonas: NEGATIVE

## 2018-07-19 LAB — PROLACTIN: Prolactin: 11.9 ng/mL

## 2018-07-19 LAB — IRON,TIBC AND FERRITIN PANEL
%SAT: 19 % (calc) (ref 16–45)
Ferritin: 61 ng/mL (ref 16–232)
Iron: 40 ug/dL (ref 40–190)
TIBC: 209 mcg/dL (calc) — ABNORMAL LOW (ref 250–450)

## 2018-07-19 NOTE — Assessment & Plan Note (Signed)
Followed by [facility]. Improving.

## 2018-07-19 NOTE — Assessment & Plan Note (Addendum)
CBC Latest Ref Rng & Units 07/18/2018 05/12/2018 04/02/2018  WBC 4.0 - 10.5 K/uL 6.7 16.5(H) 6.6  Hemoglobin 12.0 - 15.0 g/dL 9.7(L) 12.7 10.7(L)  Hematocrit 36.0 - 46.0 % 30.7(L) 40.1 33.7(L)  Platelets 150.0 - 400.0 K/uL 216.0 316 208.0   Lab Results  Component Value Date   IRON 35 (L) 12/04/2017   TIBC 256 12/04/2017   FERRITIN 50 12/04/2017   Lab Results  Component Value Date   [id number] 098 11/03/2017   Patient evaluated at ED in December for epigastric pain. CT abdomen/pelvis normal. Given fluids for dehydration. Patient has had a hysterectomy. She has had unintentional weight loss for a year now with extensive work-up. Malabsorption. Will need to add TIBC and ask if she has truly been taking her iron supplement. May require iron infusion +/- Heme/Onc.

## 2018-07-24 ENCOUNTER — Ambulatory Visit
Admission: RE | Admit: 2018-07-24 | Discharge: 2018-07-24 | Disposition: A | Discharge status: Home / Self Care | Payer: BLUE CROSS/BLUE SHIELD | Source: Ambulatory Visit | Attending: Family Medicine | Admitting: Family Medicine

## 2018-07-24 ENCOUNTER — Ambulatory Visit: Payer: BLUE CROSS/BLUE SHIELD

## 2018-07-24 DIAGNOSIS — N643 Galactorrhea not associated with childbirth: Secondary | ICD-10-CM

## 2018-07-24 DIAGNOSIS — R922 Inconclusive mammogram: Secondary | ICD-10-CM | POA: Diagnosis not present

## 2018-07-24 LAB — ANAEROBIC AND AEROBIC CULTURE
GRAM STAIN:: NONE SEEN
MICRO NUMBER:: 220989
MICRO NUMBER:: 220990
SPECIMEN QUALITY:: ADEQUATE
SPECIMEN QUALITY:: ADEQUATE

## 2018-07-24 LAB — TIQ- AMBIGUOUS ORDER

## 2018-07-25 DIAGNOSIS — H16111 Macular keratitis, right eye: Secondary | ICD-10-CM | POA: Diagnosis not present

## 2018-07-29 ENCOUNTER — Encounter: Payer: Self-pay | Admitting: Family Medicine

## 2018-07-29 MED ORDER — METRONIDAZOLE 500 MG PO TABS: 500.0000 mg | ORAL_TABLET | Freq: Two times a day (BID) | ORAL | 0 refills | Status: DC

## 2018-07-29 MED ORDER — METRONIDAZOLE 500 MG PO TABS: 500.0000 mg | ORAL_TABLET | Freq: Three times a day (TID) | ORAL | 0 refills | Status: DC

## 2018-08-01 DIAGNOSIS — M546 Pain in thoracic spine: Secondary | ICD-10-CM | POA: Diagnosis not present

## 2018-08-01 DIAGNOSIS — N62 Hypertrophy of breast: Secondary | ICD-10-CM | POA: Diagnosis not present

## 2018-08-01 DIAGNOSIS — M542 Cervicalgia: Secondary | ICD-10-CM | POA: Diagnosis not present

## 2018-08-03 ENCOUNTER — Telehealth: Payer: Self-pay | Admitting: Family Medicine

## 2018-08-03 NOTE — Telephone Encounter (Signed)
Called patient reviewed all results. She states that she has had flagyl in the past and did not help wanted to know if anything else can be tried.

## 2018-08-03 NOTE — Telephone Encounter (Signed)
Patient called again to get an update on whether there is another medicine that the doctor can prescribe for her.  Please advise and call patient back at [phone]

## 2018-08-03 NOTE — Telephone Encounter (Signed)
See note

## 2018-08-03 NOTE — Telephone Encounter (Signed)
Copied from [place] [id number]. Topic: Quick Communication - See Telephone Encounter >> Aug 03, 2018 11:33 AM Sheran Luz wrote: CRM for notification. See Telephone encounter for: 08/03/18.  Patient calling for results from 2/19. Advised patient they were released through [place], she states that she cannot access them and is requesting a call back to discuss. Please advise.

## 2018-08-06 ENCOUNTER — Telehealth: Payer: Self-pay | Admitting: Oncology

## 2018-08-06 ENCOUNTER — Other Ambulatory Visit: Payer: Self-pay

## 2018-08-06 ENCOUNTER — Other Ambulatory Visit: Payer: Self-pay | Admitting: *Deleted

## 2018-08-06 DIAGNOSIS — Z130 Encounter for screening for diseases of the blood and blood-forming organs and certain disorders involving the immune mechanism: Secondary | ICD-10-CM

## 2018-08-06 DIAGNOSIS — R634 Abnormal weight loss: Secondary | ICD-10-CM

## 2018-08-06 DIAGNOSIS — D509 Iron deficiency anemia, unspecified: Secondary | ICD-10-CM

## 2018-08-06 DIAGNOSIS — E538 Deficiency of other specified B group vitamins: Secondary | ICD-10-CM

## 2018-08-06 MED ORDER — CLINDAMYCIN HCL 300 MG PO CAPS: 300.0000 mg | ORAL_CAPSULE | Freq: Three times a day (TID) | ORAL | 0 refills | Status: DC

## 2018-08-06 NOTE — Telephone Encounter (Signed)
Called patient reviewed all information will pick up meds and let us know if any problems

## 2018-08-06 NOTE — Telephone Encounter (Signed)
Okay clindamycin 300 mg BID x 7 days. Please send in and inform patient.

## 2018-08-06 NOTE — Telephone Encounter (Signed)
A new hem appt has been scheduled for the pt to see Dr. Jana Hakim on 3/10 at 1030am with labs at 10am. Pt aware to arrive 15 minutes early.

## 2018-08-07 ENCOUNTER — Inpatient Hospital Stay: Payer: BLUE CROSS/BLUE SHIELD | Attending: Oncology | Admitting: Oncology

## 2018-08-07 ENCOUNTER — Inpatient Hospital Stay: Payer: BLUE CROSS/BLUE SHIELD

## 2018-08-07 ENCOUNTER — Other Ambulatory Visit: Payer: Self-pay

## 2018-08-07 VITALS — BP 158/93 | HR 73 | Temp 98.7°F | Resp 20 | Ht 64.0 in | Wt 160.0 lb

## 2018-08-07 DIAGNOSIS — E440 Moderate protein-calorie malnutrition: Secondary | ICD-10-CM

## 2018-08-07 DIAGNOSIS — D509 Iron deficiency anemia, unspecified: Secondary | ICD-10-CM | POA: Diagnosis not present

## 2018-08-07 DIAGNOSIS — R634 Abnormal weight loss: Secondary | ICD-10-CM | POA: Diagnosis not present

## 2018-08-07 DIAGNOSIS — Z87891 Personal history of nicotine dependence: Secondary | ICD-10-CM | POA: Insufficient documentation

## 2018-08-07 DIAGNOSIS — K529 Noninfective gastroenteritis and colitis, unspecified: Secondary | ICD-10-CM

## 2018-08-07 DIAGNOSIS — R531 Weakness: Secondary | ICD-10-CM

## 2018-08-07 DIAGNOSIS — Z90710 Acquired absence of both cervix and uterus: Secondary | ICD-10-CM | POA: Diagnosis not present

## 2018-08-07 DIAGNOSIS — R5383 Other fatigue: Secondary | ICD-10-CM | POA: Diagnosis not present

## 2018-08-07 DIAGNOSIS — K9049 Malabsorption due to intolerance, not elsewhere classified: Secondary | ICD-10-CM

## 2018-08-07 DIAGNOSIS — E538 Deficiency of other specified B group vitamins: Secondary | ICD-10-CM

## 2018-08-07 DIAGNOSIS — Z130 Encounter for screening for diseases of the blood and blood-forming organs and certain disorders involving the immune mechanism: Secondary | ICD-10-CM

## 2018-08-07 LAB — CBC WITH DIFFERENTIAL (CANCER CENTER ONLY)
Abs Immature Granulocytes: 0.01 10*3/uL (ref 0.00–0.07)
Basophils Absolute: 0 10*3/uL (ref 0.0–0.1)
Basophils Relative: 0 %
Eosinophils Absolute: 0.4 10*3/uL (ref 0.0–0.5)
Eosinophils Relative: 7 %
HCT: 30.8 % — ABNORMAL LOW (ref 36.0–46.0)
Hemoglobin: 9.6 g/dL — ABNORMAL LOW (ref 12.0–15.0)
Immature Granulocytes: 0 %
Lymphocytes Relative: 29 %
Lymphs Abs: 1.8 10*3/uL (ref 0.7–4.0)
MCH: 24.4 pg — ABNORMAL LOW (ref 26.0–34.0)
MCHC: 31.2 g/dL (ref 30.0–36.0)
MCV: 78.2 fL — ABNORMAL LOW (ref 80.0–100.0)
Monocytes Absolute: 0.5 10*3/uL (ref 0.1–1.0)
Monocytes Relative: 8 %
Neutro Abs: 3.5 10*3/uL (ref 1.7–7.7)
Neutrophils Relative %: 56 %
Platelet Count: 322 10*3/uL (ref 150–400)
RBC: 3.94 MIL/uL (ref 3.87–5.11)
RDW: 15.9 % — ABNORMAL HIGH (ref 11.5–15.5)
WBC Count: 6.3 10*3/uL (ref 4.0–10.5)
nRBC: 0 % (ref 0.0–0.2)

## 2018-08-07 LAB — RETICULOCYTES
Immature Retic Fract: 18.3 % — ABNORMAL HIGH (ref 2.3–15.9)
RBC.: 3.94 MIL/uL (ref 3.87–5.11)
Retic Count, Absolute: 78.4 10*3/uL (ref 19.0–186.0)
Retic Ct Pct: 2 % (ref 0.4–3.1)

## 2018-08-07 LAB — CMP (CANCER CENTER ONLY)
ALT: 19 U/L (ref 0–44)
AST: 22 U/L (ref 15–41)
Albumin: 3.5 g/dL (ref 3.5–5.0)
Alkaline Phosphatase: 61 U/L (ref 38–126)
Anion gap: 6 (ref 5–15)
BUN: 12 mg/dL (ref 6–20)
CO2: 30 mmol/L (ref 22–32)
Calcium: 8.8 mg/dL — ABNORMAL LOW (ref 8.9–10.3)
Chloride: 104 mmol/L (ref 98–111)
Creatinine: 0.79 mg/dL (ref 0.44–1.00)
GFR, Est AFR Am: >60 mL/min (ref >60)
GFR, Estimated: >60 mL/min (ref >60)
Glucose, Bld: 88 mg/dL (ref 70–99)
Potassium: 3.8 mmol/L (ref 3.5–5.1)
Sodium: 140 mmol/L (ref 135–145)
Total Bilirubin: 0.3 mg/dL (ref 0.3–1.2)
Total Protein: 6.9 g/dL (ref 6.5–8.1)

## 2018-08-07 LAB — FOLATE: Folate: 11.4 ng/mL (ref >5.9)

## 2018-08-07 LAB — VITAMIN B12: Vitamin B-12: 445 pg/mL (ref 180–914)

## 2018-08-07 NOTE — Progress Notes (Signed)
[place]  [phone]) [phone] [phone]     ID: Suzanne [name] DOB: August 11, 1975  MR#: [id number]  KNL#:[id number]  Patient Care Team: Briscoe Deutscher, DO as PCP - General (Family Medicine) Brittan Butterbaugh, Virgie Dad, MD as Consulting Physician (Hematology and Oncology) Sena Slate, MD as Referring Physician (Ophthalmology) Erlene Quan, PA-C as Physician Assistant (Cardiology) Alda Berthold, DO as Consulting Physician (Neurology) OTHER MD:   CHIEF COMPLAINT: Microcytic anemia  CURRENT TREATMENT: Work-up in progress   HISTORY OF CURRENT ILLNESS: Suzanne [name] is referred today for evaluation of microcytic anemia with unintentional weight loss.  As far as the weight loss is concerned the patient weighed 217 pounds 08/15/2013.  Her weight dropped to 157 by 12/24/2015.  Her weight dropped further to 144 pounds as of 08/12/2016, with the lowest reading on record being 136.6 on 11/25/2016.  Since that time she has been gaining weight so that today her weight is 160 pounds.  We have lab work on her dating back to 09/15/2010.  At that time her hemoglobin was 11.7 and her MCV 71.7.  Platelets were 259 and white cells slightly elevated at 10.9.  In subsequent years the hemoglobin has been as low as 9.6, and as high as 13.6 (post transfusion?) with most values in the 11.0, 0.5 range.  Specifically the hemoglobin has varied widely as follows:  Results for Suzanne, [name] (MRN [id number]) as of 08/07/2018 10:33  Ref. Range 02/26/2013 16:35 07/19/2013 11:38 07/21/2014 03:12 07/18/2015 16:47 05/19/2016 08:04 08/03/2016 11:00 11/25/2016 11:28 11/03/2017 14:14 12/11/2017 11:34 04/02/2018 16:23 05/12/2018 03:23 07/18/2018 09:51 08/07/2018 09:41  Hemoglobin Latest Ref Range: 12.0 - 15.0 g/dL 11.6 (L) 11.0 (L) 9.7 (L) 13.6 11.5 (L) 10.8 (L) 11.4 (L) 11.0 (L) 12.0 10.7 (L) 12.7 9.7 (L) 9.6 (L)   No MCV has been higher than 78.2, with most readings in the low 70s.  She takes iron  supplementation daily, and tells me she has not missed any doses.  She does not have significant problems from the oral iron.  Ferritin has been in the low normal range:  Results for Suzanne, [name] (MRN [id number]) as of 08/07/2018 10:33  Ref. Range 12/04/2017 10:02 07/18/2018 09:51  Ferritin Latest Ref Range: 16 - 232 ng/mL 50 61    The patient's subsequent history is as detailed below.   INTERVAL HISTORY: Suzanne [name] was evaluated in the hematology clinic on 08/07/2018.   REVIEW OF SYSTEMS: Suzanne [name] states that she always feels fatigued and weak, which is worse with exertion. She feels short of breath when she walks up flights of stairs. At her job, she stands all day, but she does not have a formal exercise routine. She has had no bleeding to her knowledge. She has some headaches, at the back of her head; she takes some Excedrin once or twice a week. She attributes her weight loss to stress from grief after losing her son in a fire in 08/2017.  The patient denies unusual headaches, visual changes, nausea, vomiting, stiff neck, dizziness, or gait imbalance. There has been no cough, phlegm production, or pleurisy, no chest pain or pressure, and no change in bowel or bladder habits. The patient denies fever, rash, or bleeding. A detailed review of systems was otherwise entirely negative.   PAST MEDICAL HISTORY: Past Medical History:  Diagnosis Date  . Allergy   . Anemia   . Anxiety   . Arthritis   . Asthma   . Chest pain   .  Chronic lower back pain   . Depression   . GERD (gastroesophageal reflux disease)   . Hypertension   . Migraine   . Neck pain, chronic      PAST SURGICAL HISTORY: Past Surgical History:  Procedure Laterality Date  . ESOPHAGEAL DILATION    . ESOPHAGEAL MANOMETRY N/A 04/01/2013   Procedure: ESOPHAGEAL MANOMETRY (EM);  Surgeon: Milus Banister, MD;  Location: [facility];  Service: Endoscopy;  Laterality: N/A;  . ESOPHAGOGASTRODUODENOSCOPY (EGD) WITH ESOPHAGEAL  DILATION N/A 02/28/2013   Procedure: ESOPHAGOGASTRODUODENOSCOPY (EGD) WITH ESOPHAGEAL DILATION;  Surgeon: Milus Banister, MD;  Location: [facility];  Service: Endoscopy;  Laterality: N/A;  . TUBAL LIGATION    . VAGINAL HYSTERECTOMY       FAMILY HISTORY: Family History  Problem Relation Age of Onset  . Heart disease Father        H/O CABG  . Hypertension Mother   . Hyperlipidemia Maternal Grandmother   . Hypertension Maternal Grandmother   . Hypertension Paternal Grandmother   . Diabetes Paternal Grandmother   . Cancer - Lung Paternal Grandmother   . Breast cancer Neg Hx    Suzanne [name]'s father died age 41. Patients' mother died at age 51. The patient has 3 brothers and 4 sisters. Patient denies anyone in her family having breast, ovarian, prostate, or pancreatic cancer. Suzanne [name] notes that her paternal grandmother was diagnosed with lung cancer. She also notes that her grandmother was prescribed blood thinners.    GYNECOLOGIC HISTORY:  No LMP recorded. Patient has had a hysterectomy. 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 Her husband, Thi Klich, is a meat cutter. Suzanne [name] daughter is 70, works at [employer], and attends school full time. Suzanne [name] lost a son at the age of 69 to a house fire in 08/2017. Suzanne [name] does not attend a church, Glass blower/designer, or mosque.    ADVANCED DIRECTIVES: Suzanne [name]'s husband, Vernee Baines, is automatically her healthcare power of attorney.     HEALTH MAINTENANCE: Social History   Tobacco Use  . Smoking status: Former Smoker    Packs/day: 0.10    Years: 10.00    Pack years: 1.00    Types: Cigars    Last attempt to quit: 12/06/2017    Years since quitting: 0.6  . Smokeless tobacco: Never Used  . Tobacco comment: smokes 2 Black & Milds per day 08/19/13  Substance Use Topics  . Alcohol  use: Yes    Comment: 1-2 times yearly  . Drug use: No    Colonoscopy: n/a  PAP: referral to GYN in process  Bone density: n/a   Allergies  Allergen Reactions  . Reglan [Metoclopramide] Shortness Of Breath  . Lisinopril Cough  . Losartan Cough  . Sulfa Antibiotics Nausea And Vomiting  . Amoxicillin Rash    Has patient had a PCN reaction causing immediate rash, facial/tongue/throat swelling, SOB or lightheadedness with hypotension: Yes Has patient had a PCN reaction causing severe rash involving mucus membranes or skin necrosis: Broke out face & buttocks Has patient had a PCN reaction that required hospitalization: No Has patient had a PCN reaction occurring within the last 10 years: No If all of the above answers are "NO", then may proceed with Cephalosporin use  . Contrast Media [Iodinated Diagnostic Agents] Rash    Current Outpatient Medications  Medication Sig Dispense Refill  .  albuterol (PROVENTIL) (2.5 MG/3ML) 0.083% nebulizer solution Take 3 mLs (2.5 mg total) by nebulization every 4 (four) hours as needed for wheezing or shortness of breath. 150 mL 1  . amLODipine (NORVASC) 10 MG tablet Take 1 tablet (10 mg total) by mouth daily. 90 tablet 2  . clindamycin (CLEOCIN) 300 MG capsule Take 1 capsule (300 mg total) by mouth 3 (three) times daily. 21 capsule 0  . ferrous sulfate (IRON SUPPLEMENT) 325 (65 FE) MG tablet Take 325 mg by mouth daily with breakfast.    . hydrALAZINE (APRESOLINE) 25 MG tablet Take 1 tablet (25 mg total) by mouth 2 (two) times daily. 180 tablet 3  . hydrochlorothiazide (HYDRODIURIL) 25 MG tablet Take 1 tablet (25 mg total) by mouth daily. 90 tablet 1  . meloxicam (MOBIC) 15 MG tablet Take 1 tablet (15 mg total) by mouth daily. 30 tablet 0  . metroNIDAZOLE (FLAGYL) 500 MG tablet Take 1 tablet (500 mg total) by mouth 2 (two) times daily. 14 tablet 0  . Multiple Vitamins-Calcium (ONE-A-DAY WOMENS FORMULA PO) Take 1 tablet by mouth daily.    Marland Kitchen omeprazole  (PRILOSEC) 20 MG capsule Take 1 capsule (20 mg total) by mouth daily. 30 capsule 3  . trifluridine (VIROPTIC) 1 % ophthalmic solution Place 1 drop into the right eye 5 (five) times daily.    Marland Kitchen UNABLE TO FIND Apply to eye.    . valACYclovir (VALTREX) 1000 MG tablet Take 1,000 mg by mouth 3 (three) times daily.     No current facility-administered medications for this visit.      OBJECTIVE: Young African-American woman in no acute distress  Vitals:   08/07/18 1000  BP: (!) 158/93  Pulse: 73  Resp: 20  Temp: 98.7 F (37.1 C)  SpO2: 100%     Body mass index is 27.46 kg/m.   Wt Readings from Last 3 Encounters:  08/07/18 160 lb (72.6 kg)  07/18/18 159 lb (72.1 kg)  05/12/18 155 lb (70.3 kg)      ECOG FS:1 - Symptomatic but completely ambulatory  Ocular: Sclerae unicteric, pupils round and equal Ear-nose-throat: Full upper plate Lymphatic: No cervical or supraclavicular adenopathy Lungs no rales or rhonchi Heart regular rate and rhythm Abd soft, nontender, positive bowel sounds MSK no focal spinal tenderness, no joint edema Neuro: non-focal, well-oriented, appropriate affect Breasts: Deferred   LAB RESULTS:  CMP     Component Value Date/Time   NA 140 08/07/2018 0941   K 3.8 08/07/2018 0941   CL 104 08/07/2018 0941   CO2 30 08/07/2018 0941   GLUCOSE 88 08/07/2018 0941   BUN 12 08/07/2018 0941   CREATININE 0.79 08/07/2018 0941   CREATININE 0.58 07/19/2013 1138   CALCIUM 8.8 (L) 08/07/2018 0941   PROT 6.9 08/07/2018 0941   ALBUMIN 3.5 08/07/2018 0941   AST 22 08/07/2018 0941   ALT 19 08/07/2018 0941   ALKPHOS 61 08/07/2018 0941   BILITOT 0.3 08/07/2018 0941   GFRNONAA >60 08/07/2018 0941   GFRAA >60 08/07/2018 0941    No results found for: TOTALPROTELP, ALBUMINELP, A1GS, A2GS, BETS, BETA2SER, GAMS, MSPIKE, SPEI  No results found for: KPAFRELGTCHN, LAMBDASER, KAPLAMBRATIO  Lab Results  Component Value Date   WBC 6.3 08/07/2018   NEUTROABS 3.5 08/07/2018    HGB 9.6 (L) 08/07/2018   HCT 30.8 (L) 08/07/2018   MCV 78.2 (L) 08/07/2018   PLT 322 08/07/2018    @LASTCHEMISTRY @  No results found for: LABCA2  No components found for: [id number]  No results for input(s): INR in the last 168 hours.  No results found for: LABCA2  No results found for: YDX412  No results found for: INO676  No results found for: HMC947  No results found for: CA2729  No components found for: HGQUANT  No results found for: CEA1 / No results found for: CEA1   No results found for: AFPTUMOR  No results found for: CHROMOGRNA  No results found for: PSA1  Appointment on 08/07/2018  Component Date Value Ref Range Status  . WBC Count 08/07/2018 6.3  4.0 - 10.5 K/uL Final  . RBC 08/07/2018 3.94  3.87 - 5.11 MIL/uL Final  . Hemoglobin 08/07/2018 9.6* 12.0 - 15.0 g/dL Final   Comment: Reticulocyte Hemoglobin testing may be clinically indicated, consider ordering this additional test [id number]   . HCT 08/07/2018 30.8* 36.0 - 46.0 % Final  . MCV 08/07/2018 78.2* 80.0 - 100.0 fL Final  . MCH 08/07/2018 24.4* 26.0 - 34.0 pg Final  . MCHC 08/07/2018 31.2  30.0 - 36.0 g/dL Final  . RDW 08/07/2018 15.9* 11.5 - 15.5 % Final  . Platelet Count 08/07/2018 322  150 - 400 K/uL Final  . nRBC 08/07/2018 0.0  0.0 - 0.2 % Final  . Neutrophils Relative % 08/07/2018 56  % Final  . Neutro Abs 08/07/2018 3.5  1.7 - 7.7 K/uL Final  . Lymphocytes Relative 08/07/2018 29  % Final  . Lymphs Abs 08/07/2018 1.8  0.7 - 4.0 K/uL Final  . Monocytes Relative 08/07/2018 8  % Final  . Monocytes Absolute 08/07/2018 0.5  0.1 - 1.0 K/uL Final  . Eosinophils Relative 08/07/2018 7  % Final  . Eosinophils Absolute 08/07/2018 0.4  0.0 - 0.5 K/uL Final  . Basophils Relative 08/07/2018 0  % Final  . Basophils Absolute 08/07/2018 0.0  0.0 - 0.1 K/uL Final  . Immature Granulocytes 08/07/2018 0  % Final  . Abs Immature Granulocytes 08/07/2018 0.01  0.00 - 0.07 K/uL Final   Performed at [facility], [place] [address], [place], [place] [address]  . Sodium 08/07/2018 140  135 - 145 mmol/L Final  . Potassium 08/07/2018 3.8  3.5 - 5.1 mmol/L Final  . Chloride 08/07/2018 104  98 - 111 mmol/L Final  . CO2 08/07/2018 30  22 - 32 mmol/L Final  . Glucose, Bld 08/07/2018 88  70 - 99 mg/dL Final  . BUN 08/07/2018 12  6 - 20 mg/dL Final  . Creatinine 08/07/2018 0.79  0.44 - 1.00 mg/dL Final  . Calcium 08/07/2018 8.8* 8.9 - 10.3 mg/dL Final  . Total Protein 08/07/2018 6.9  6.5 - 8.1 g/dL Final  . Albumin 08/07/2018 3.5  3.5 - 5.0 g/dL Final  . AST 08/07/2018 22  15 - 41 U/L Final  . ALT 08/07/2018 19  0 - 44 U/L Final  . Alkaline Phosphatase 08/07/2018 61  38 - 126 U/L Final  . Total Bilirubin 08/07/2018 0.3  0.3 - 1.2 mg/dL Final  . GFR, Est Non Af Am 08/07/2018 >60  >60 mL/min Final  . GFR, Est AFR Am 08/07/2018 >60  >60 mL/min Final  . Anion gap 08/07/2018 6  5 - 15 Final   Performed at [facility], [place] [address], [place], [place] [address]  . Retic Ct Pct 08/07/2018 2.0  0.4 - 3.1 % Final  . RBC. 08/07/2018 3.94  3.87 - 5.11 MIL/uL Final  . Retic Count, Absolute 08/07/2018 78.4  19.0 -  186.0 K/uL Final  . Immature Retic Fract 08/07/2018 18.3* 2.3 - 15.9 % Final   Performed at [facility], [place] [address], [place],  [address]    (this displays the last labs from the last 3 days)  No results found for: TOTALPROTELP, ALBUMINELP, A1GS, A2GS, BETS, BETA2SER, GAMS, MSPIKE, SPEI (this displays SPEP labs)  No results found for: KPAFRELGTCHN, LAMBDASER, KAPLAMBRATIO (kappa/lambda light chains)  No results found for: HGBA, HGBA2QUANT, HGBFQUANT, HGBSQUAN (Hemoglobinopathy evaluation)   No results found for: LDH  Lab Results  Component Value Date   IRON 40 07/18/2018   TIBC 209 (L) 07/18/2018   IRONPCTSAT 19 07/18/2018   (Iron and TIBC)  Lab Results  Component Value Date   FERRITIN 61 07/18/2018     Urinalysis    Component Value Date/Time   COLORURINE STRAW (A) 05/12/2018 0503   APPEARANCEUR CLEAR 05/12/2018 0503   LABSPEC 1.006 05/12/2018 0503   PHURINE 7.0 05/12/2018 0503   GLUCOSEU NEGATIVE 05/12/2018 0503   HGBUR NEGATIVE 05/12/2018 0503   BILIRUBINUR NEGATIVE 05/12/2018 0503   BILIRUBINUR Negative 04/20/2018 0940   KETONESUR NEGATIVE 05/12/2018 0503   PROTEINUR NEGATIVE 05/12/2018 0503   UROBILINOGEN 0.2 04/20/2018 0940   UROBILINOGEN 0.2 02/19/2014 1213   NITRITE NEGATIVE 05/12/2018 0503   LEUKOCYTESUR NEGATIVE 05/12/2018 0503     STUDIES:  Mm Diag Breast Tomo Bilateral  Result Date: 07/24/2018 CLINICAL DATA:  43 year old female presenting for bilateral spontaneous nipple discharge. The patient states that she has always had nipple discharge, but within the last 3-4 months, but has changed to a black or green color. EXAM: DIGITAL DIAGNOSTIC BILATERAL MAMMOGRAM WITH CAD AND TOMO COMPARISON:  Previous exam(s). ACR Breast Density Category c: The breast tissue is heterogeneously dense, which may obscure small masses. FINDINGS: No suspicious calcifications, masses or areas of distortion are seen in the bilateral breasts. Mammographic images were processed with CAD. On physical exam, multi duct yellowish nipple discharge can be expressed from the breasts bilaterally. IMPRESSION: 1. There is bilateral spontaneous multi duct nipple discharge, which is a benign finding. 2.  No mammographic evidence of malignancy in the bilateral breasts. RECOMMENDATION: 1. Further management of nipple discharge should be based on clinical assessment. This is felt to be physiologic/related to a benign process given that it is from multiple ducts and yellow in color. The patient was advised to alert her doctor if she experiences spontaneous unilateral bloody or clear nipple discharge from a single duct only. 2.  Screening mammogram in one year.(Code:[id number]-B-01Y) I have discussed the findings and  recommendations with the patient. Results were also provided in writing at the conclusion of the visit. If applicable, a reminder letter will be sent to the patient regarding the next appointment. BI-RADS CATEGORY  1: Negative. Electronically Signed   By: Ammie Ferrier M.D.   On: 07/24/2018 09:47     Review of peripheral blood film: Red cells are well hemoglobin I asked, no significant poikilocytosis, no schistocytes, rare targets, unremarkable white cell and platelet series   ASSESSMENT: 43 y.o. [place], [place] woman with a long history of microcytic anemia, no MCV higher than 78 (even when hemoglobin in normal range), on oral iron supplementation with normal ferritin studies.   PLAN: I spent approximately 50 minutes face to face with Suzanne [name] with more than 50% of that time spent in counseling and coordination of care. Specifically we reviewed the biology of the patient's diagnosis and the specifics of her situation.  She understands that all  blood cells are made in the bone marrow.  The white cells, which are our immune system, in her case are in the normal range.  In addition review of the peripheral film shows normal white cell morphology.  Her platelet count is also normal.  Having 2 of 3 of the blood cell lines normal means that we are unlikely to be dealing with a primary bone marrow problem.    We then dealt with the fact that her cells are microcytic.  They have been microcytic even when we have documentation of normal iron stores and when she has a normal hemoglobin.  This is consistent with a hemoglobinopathy, and of course hemoglobinopathies are very common in the African-American population.  This could be alpha or beta.  While it would be possible to send a hemoglobin electrophoresis to specify this in more detail, this would not affect prognosis or treatment.  The only thing to keep in mind is that the patient's red cells will always be small even when she is not iron deficient and  therefore documentation of iron deficiency will depend on ferritin and total iron binding capacity iron studies.  We are then left with the fact that this patient has been intermittently anemic.  Currently her hemoglobin is 9.6, and her reticulocyte count is inappropriately normal at 78.4.  Given her normal B12 and folate, and her normal iron studies 07/18/2018, and given her normal renal function, this suggests anemia of inflammation.  We have an ANA pending which may help sort this out.  However I remain puzzled by the changes in the patient's hemoglobin.  She was 10.7 in early November and 12.7 only 6 weeks later, then 9.7 six weeks after that.  If she were menstruating that might be an explanation, but she is status post hysterectomy.  She herself is not aware of any bleeding events.  As far as her significant weight loss is concerned, she appears to be gaining weight at this point and that requires only follow-up.  At this point I am waiting on some additional labs today but it may be best to schedule her for a additional lab work in 6 to 8 weeks and see if we can get a better idea of what is causing her hemoglobin fluctuations.  Suzanne [name] has a good understanding of the overall plan. She agrees with it. She will call with any problems that may develop before her next visit here.   Suzanne [name], Virgie Dad, MD  08/07/18 11:00 AM Medical Oncology and Hematology [facility] [address] [place], [place] [address] Tel. [phone]    Fax. [phone]   I, Jacqualyn Posey am acting as a Education administrator for Chauncey Cruel, MD.   I, Lurline Del MD, have reviewed the above documentation for accuracy and completeness, and I agree with the above.

## 2018-08-08 ENCOUNTER — Other Ambulatory Visit: Payer: Self-pay

## 2018-08-08 ENCOUNTER — Encounter: Payer: Self-pay | Admitting: Internal Medicine

## 2018-08-08 ENCOUNTER — Telehealth: Payer: Self-pay | Admitting: Oncology

## 2018-08-08 ENCOUNTER — Ambulatory Visit (INDEPENDENT_AMBULATORY_CARE_PROVIDER_SITE_OTHER): Payer: BLUE CROSS/BLUE SHIELD | Admitting: Internal Medicine

## 2018-08-08 VITALS — BP 132/86 | HR 70 | Ht 64.0 in | Wt 159.0 lb

## 2018-08-08 DIAGNOSIS — R7989 Other specified abnormal findings of blood chemistry: Secondary | ICD-10-CM | POA: Diagnosis not present

## 2018-08-08 DIAGNOSIS — N643 Galactorrhea not associated with childbirth: Secondary | ICD-10-CM

## 2018-08-08 LAB — T4, FREE: Free T4: 0.78 ng/dL (ref 0.60–1.60)

## 2018-08-08 LAB — ANTINUCLEAR ANTIBODIES, IFA: ANA Ab, IFA: NEGATIVE

## 2018-08-08 LAB — FOLLICLE STIMULATING HORMONE: FSH: 53.2 m[IU]/mL

## 2018-08-08 LAB — TSH: TSH: 0.73 u[IU]/mL (ref 0.35–4.50)

## 2018-08-08 LAB — LUTEINIZING HORMONE: LH: 20.99 m[IU]/mL

## 2018-08-08 LAB — SICKLE CELL SCREEN: Sickle Cell Screen: NEGATIVE

## 2018-08-08 NOTE — Progress Notes (Signed)
Name: Suzanne [name]  MRN/ DOB: [id number], 29-Jan-1976    Age/ Sex: 43 y.o., female    PCP: Briscoe Deutscher, DO   Reason for Endocrinology Evaluation:  Low TSH     Date of Initial Endocrinology Evaluation: 08/09/2018     HPI: Suzanne [name] is a 43 y.o. female with a past medical history of HTN. Suzanne [name] presented for initial endocrinology clinic visit on 08/09/2018 for consultative assistance with her low TSH .   [name] was found to have a low TSH at 0.20 uIU/mL, during routine work-up in February 2020. She was complaining of vaginal discharge at Suzanne time.   [name] has chronic hx of weight loss, she  has cold and heat intolerance, palpitations , anxious and irritability for years.    She has chronic constipation.   Denies local neck symptoms.   Has no prior exposure to radiation.   She is not on biotin.     She is S/P hysterectomy in 2001  She is c/o milky breast discharge  For years.  Has migraine headaches for years, has gotten worse in Suzanne past year since losing her son in 08/2017.     MGM and Maternal aunt with thyroid disease.   HISTORY:  Past Medical History:  Past Medical History:  Diagnosis Date  . Allergy   . Anemia   . Anxiety   . Arthritis   . Asthma   . Chest pain   . Chronic lower back pain   . Depression   . GERD (gastroesophageal reflux disease)   . Hypertension   . Migraine   . Neck pain, chronic    Past Surgical History:  Past Surgical History:  Procedure Laterality Date  . ESOPHAGEAL DILATION    . ESOPHAGEAL MANOMETRY N/A 04/01/2013   Procedure: ESOPHAGEAL MANOMETRY (EM);  Surgeon: Milus Banister, MD;  Location: [facility];  Service: Endoscopy;  Laterality: N/A;  . ESOPHAGOGASTRODUODENOSCOPY (EGD) WITH ESOPHAGEAL DILATION N/A 02/28/2013   Procedure: ESOPHAGOGASTRODUODENOSCOPY (EGD) WITH ESOPHAGEAL DILATION;  Surgeon: Milus Banister, MD;  Location: [facility];  Service: Endoscopy;  Laterality: N/A;  . TUBAL LIGATION    .  VAGINAL HYSTERECTOMY        Social History:  reports that she quit smoking about 8 months ago. Her smoking use included cigars. She has a 1.00 pack-year smoking history. She has never used smokeless tobacco. She reports current alcohol use. She reports that she does not use drugs.  Family History: family history includes Cancer - Lung in her paternal grandmother; Diabetes in her paternal grandmother; Heart disease in her father; Hyperlipidemia in her maternal grandmother; Hypertension in her maternal grandmother, mother, and paternal grandmother; Thyroid disease in her maternal grandmother.   HOME MEDICATIONS: Allergies as of 08/08/2018      Reactions   Reglan [metoclopramide] Shortness Of Breath   Lisinopril Cough   Losartan Cough   Sulfa Antibiotics Nausea And Vomiting   Amoxicillin Rash   Has [name] had a PCN reaction causing immediate rash, facial/tongue/throat swelling, SOB or lightheadedness with hypotension: Yes Has [name] had a PCN reaction causing severe rash involving mucus membranes or skin necrosis: Broke out face & buttocks Has [name] had a PCN reaction that required hospitalization: No Has [name] had a PCN reaction occurring within Suzanne last 10 years: No If all of Suzanne above answers are "NO", then may proceed with Cephalosporin use   Contrast Media [iodinated Diagnostic Agents] Rash      Medication List  Accurate as of August 08, 2018 11:59 PM. Always use your most recent med list.        albuterol (2.5 MG/3ML) 0.083% nebulizer solution Commonly known as:  PROVENTIL Take 3 mLs (2.5 mg total) by nebulization every 4 (four) hours as needed for wheezing or shortness of breath.   amLODipine 10 MG tablet Commonly known as:  NORVASC Take 1 tablet (10 mg total) by mouth daily.   clindamycin 300 MG capsule Commonly known as:  CLEOCIN Take 1 capsule (300 mg total) by mouth 3 (three) times daily.   hydrALAZINE 25 MG tablet Commonly known as:  APRESOLINE Take 1  tablet (25 mg total) by mouth 2 (two) times daily.   hydrochlorothiazide 25 MG tablet Commonly known as:  HYDRODIURIL Take 1 tablet (25 mg total) by mouth daily.   Iron Supplement 325 (65 FE) MG tablet Generic drug:  ferrous sulfate Take 325 mg by mouth daily with breakfast.   meloxicam 15 MG tablet Commonly known as:  MOBIC Take 1 tablet (15 mg total) by mouth daily.   metroNIDAZOLE 500 MG tablet Commonly known as:  FLAGYL Take 1 tablet (500 mg total) by mouth 2 (two) times daily.   omeprazole 20 MG capsule Commonly known as:  PRILOSEC Take 1 capsule (20 mg total) by mouth daily.   ONE-A-DAY WOMENS FORMULA PO Take 1 tablet by mouth daily.   trifluridine 1 % ophthalmic solution Commonly known as:  VIROPTIC Place 1 drop into Suzanne right eye 5 (five) times daily.   UNABLE TO FIND Apply to eye.   Valtrex 1000 MG tablet Generic drug:  valACYclovir Take 1,000 mg by mouth 3 (three) times daily.         REVIEW OF SYSTEMS: A comprehensive ROS was conducted with Suzanne [name] and is negative except as per HPI and below:  Review of Systems  Constitutional: Positive for malaise/fatigue and weight loss.  HENT: Negative for congestion and sore throat.   Eyes:       Has keratitis of Suzanne right eye   Respiratory: Negative for cough and shortness of breath.   Cardiovascular: Positive for palpitations. Negative for chest pain.  Gastrointestinal: Positive for constipation. Negative for nausea.  Genitourinary: Negative for frequency.  Neurological: Negative for tingling and tremors.  Endo/Heme/Allergies: Negative for polydipsia.  Psychiatric/Behavioral: Positive for depression. Suzanne [name] is nervous/anxious.        OBJECTIVE:  VS: BP 132/86 (BP Location: Left Arm, [name] Position: Sitting, Cuff Size: Normal)   Pulse 70   Ht 5\' 4"  (1.626 m)   Wt 159 lb (72.1 kg)   SpO2 96%   BMI 27.29 kg/m    Wt Readings from Last 3 Encounters:  08/08/18 159 lb (72.1 kg)  08/07/18 160 lb  (72.6 kg)  07/18/18 159 lb (72.1 kg)     EXAM: General: Pt appears well and is in NAD  Hydration: Well-hydrated with moist mucous membranes and good skin turgor  Eyes: External eye exam normal without stare, lid lag or exophthalmos.  EOM intact.  PERRL.  Ears, Nose, Throat: Hearing: Grossly intact bilaterally Dental: Good dentition  Throat: Clear without mass, erythema or exudate  Neck: General: Supple without adenopathy. Thyroid: Thyroid size normal.  No goiter or nodules appreciated. No thyroid bruit.  Breast : Breast exam is symmetrical, no nodules noted, left break milky discharge noted, no discharge on Suzanne  Right.   Lungs: Clear with good BS bilat with no rales, rhonchi, or wheezes  Heart: Auscultation: RRR.  Abdomen: Normoactive bowel sounds, soft, nontender, without masses or organomegaly palpable  Extremities:  BL LE: No pretibial edema normal ROM and strength.  Skin: Hair: Texture and amount normal with gender appropriate distribution Skin Inspection: No rashes, acanthosis nigricans/skin tags. No lipohypertrophy Skin Palpation: Skin temperature, texture, and thickness normal to palpation  Neuro: Cranial nerves: II - XII grossly intact  Cerebellar: Normal coordination and movement; no tremor Motor: Normal strength throughout DTRs: 2+ and symmetric in UE without delay in relaxation phase  Mental Status: Judgment, insight: Intact Orientation: Oriented to time, place, and person Memory: Intact for recent and remote events Mood and affect: No depression, anxiety, or agitation     DATA REVIEWED:   Results for SHARLIZE, HOAR (MRN [id number]) as of 08/09/2018 11:17  Ref. Range 08/08/2018 10:04  LH Latest Units: mIU/mL 20.99  FSH Latest Units: mIU/ML 53.2  Estradiol Latest Units: pg/mL 27  TSH Latest Ref Range: 0.35 - 4.50 uIU/mL 0.73  Triiodothyronine (T3) Latest Ref Range: 76 - 181 ng/dL 108  T4,Free(Direct) Latest Ref Range: 0.60 - 1.60 ng/dL 0.78     ASSESSMENT/PLAN/RECOMMENDATIONS:   1. Subclinical hyperthyroidism:  - Suzanne causes of subclinical hyperthyroidism are Suzanne same as Suzanne causes of overt hyperthyroidism, and like overt hyperthyroidism, subclinical hyperthyroidism can be persistent or transient. Common causes of subclinical hyperthyroidism include excessive thyroid hormone therapy (exogenous subclinical hyperthyroidism), autonomously functioning thyroid adenomas and multinodular goiters (endogenous subclinical hyperthyroidism), or Graves' disease (endogenous subclinical hyperthyroidism).   - Most patients with subclinical hyperthyroidism have no clinical manifestations of hyperthyroidism, and those symptoms that are present (eg, tachycardia, tremor, dyspnea on exertion, weight loss) are mild and nonspecific." However, subclinical hyperthyroidism is associated with an increased risk of atrial fibrillation and, primarily in postmenopausal women, a decrease in bone mineral density.  -Repeat TFTs today are normal. I am still waiting on TRAb levels. This could have  Been a lob error or recovering thyroiditis.    2.  Galactorrhea:   -[name] had a normal mammogram and prolactin level, she has not been on any antipsychotic medications except for Zoloft in Suzanne past. -We have discussed Suzanne importance of avoiding nipple stimulation - Repeat  prolactin level with dilution is normal.  - I have explained to her that she has no hormonal abnormality to cause galactorrhea, but if this is bothersome to her a trial of bromocriptine or cabergoline could be initiated.   3. Elevated FSH:  [name] is in Suzanne post-menopausal status.      Follow-up in PRN  Addendum: spoke to Suzanne [name] on 3/12 at 11:20 am and discussed labs results as above.   Signed electronically by: Mack Guise, MD  [facility] End[facility]  [facility] [address] [place], [place] [address] Phone: [phone] FAX:  [phone]   CC: Briscoe Deutscher, [place] [address] Phone: [phone] Fax: [phone]   Return to Endocrinology clinic as below: Future Appointments  Date Time Provider [place]  08/21/2018 10:00 AM Salvadore Dom, MD [place] None  09/17/2018  3:00 PM [facility] None  10/01/2018  3:00 PM Magrinat, Virgie Dad, MD [facility] None

## 2018-08-08 NOTE — Telephone Encounter (Signed)
Scheduled appt per 3/10 sch message - pt aware of appt date and time   

## 2018-08-09 ENCOUNTER — Encounter: Payer: Self-pay | Admitting: Obstetrics and Gynecology

## 2018-08-10 DIAGNOSIS — H179 Unspecified corneal scar and opacity: Secondary | ICD-10-CM | POA: Diagnosis not present

## 2018-08-11 LAB — PROLACTIN W/DILUTION: PROLACTIN,UNDILUTED: 9.4 ng/mL (ref 2.0–30.0)

## 2018-08-11 LAB — TRAB (TSH RECEPTOR BINDING ANTIBODY): TRAB: 1.57 IU/L (ref <=2.00)

## 2018-08-11 LAB — T3: T3, Total: 108 ng/dL (ref 76–181)

## 2018-08-11 LAB — ESTRADIOL: Estradiol: 27 pg/mL

## 2018-08-21 ENCOUNTER — Ambulatory Visit: Payer: BLUE CROSS/BLUE SHIELD | Admitting: Obstetrics and Gynecology

## 2018-08-22 ENCOUNTER — Other Ambulatory Visit: Payer: Self-pay

## 2018-08-22 ENCOUNTER — Ambulatory Visit (INDEPENDENT_AMBULATORY_CARE_PROVIDER_SITE_OTHER): Payer: BLUE CROSS/BLUE SHIELD | Admitting: Obstetrics and Gynecology

## 2018-08-22 ENCOUNTER — Encounter: Payer: Self-pay | Admitting: Obstetrics and Gynecology

## 2018-08-22 VITALS — BP 110/80 | HR 68 | Temp 98.9°F | Resp 16 | Wt 161.0 lb

## 2018-08-22 DIAGNOSIS — R1032 Left lower quadrant pain: Secondary | ICD-10-CM

## 2018-08-22 DIAGNOSIS — R3911 Hesitancy of micturition: Secondary | ICD-10-CM

## 2018-08-22 DIAGNOSIS — R3989 Other symptoms and signs involving the genitourinary system: Secondary | ICD-10-CM | POA: Diagnosis not present

## 2018-08-22 DIAGNOSIS — N898 Other specified noninflammatory disorders of vagina: Secondary | ICD-10-CM

## 2018-08-22 DIAGNOSIS — N941 Unspecified dyspareunia: Secondary | ICD-10-CM

## 2018-08-22 DIAGNOSIS — N951 Menopausal and female climacteric states: Secondary | ICD-10-CM

## 2018-08-22 DIAGNOSIS — M6289 Other specified disorders of muscle: Secondary | ICD-10-CM

## 2018-08-22 DIAGNOSIS — R102 Pelvic and perineal pain: Secondary | ICD-10-CM

## 2018-08-22 LAB — POCT URINALYSIS DIPSTICK
Bilirubin, UA: NEGATIVE
Blood, UA: NEGATIVE
Glucose, UA: NEGATIVE
Ketones, UA: NEGATIVE
Leukocytes, UA: NEGATIVE
Nitrite, UA: NEGATIVE
Protein, UA: NEGATIVE
Urobilinogen, UA: NEGATIVE E.U./dL — AB
pH, UA: 5 (ref 5.0–8.0)

## 2018-08-22 NOTE — Progress Notes (Signed)
GYNECOLOGY  VISIT   HPI: 43 y.o.   Married Black or [place] American Not Hispanic or Latino  female   [phone] with No LMP recorded. Patient has had a hysterectomy.  She is sent for a consultation for pelvic pain by Inda Coke, PA. She had a hysterectomy in 2001, has been having pelvic pain for the last 6 years. Getting worse over time. She has pain 2-3 days a week in the LLQ, feels like an achy pain, up to a 6/10 in severity. Can radiate down the front of her leg. Can last for days. She also has a sharp pain in the same region that occurs 2-3 x a months. Can last for hours, is up to a 10/10 in severity. She can't function with the severe pain. She takes meloxicam, dulls the pain some, doesn't take the pain away. Certain positions help, pillow between her legs, heating pad help. The pain is worsened by walking.  She c/o dyspareunia for the last 3 years. She goes months without it secondary to pain. The pain is deep inside, every time, not positional.   She c/o urinary pressure, voids small amounts, sometimes needs to double void. Not sure if she is completely empty. BM every other day, no effect on pain.  No fevers. She has seen a GI MD, didn't feel it was the source of her pain.   She c/o frequent bv infections, she just finished taking flagyl and is currently on flagyl. Last on antibiotics for BV in the fall. She c/o recurrent watery vaginal discharge with odor.   Same partner for 12 years. Has been tested for STD's, all negative.   Ultrasound 04/03/18: absent uterus. 1.3 cm hemorrhagic cyst left ovary.  IMPRESSION: No acute process within the pelvis.  Surgically absent uterus.  Small subcentimeter anechoic area within the midline pelvis potentially representing a small cyst or fluid, likely of no clinical significance.  She had a renal CT in 12/19. No stones, no adnexal masses.   Recent FSH 53, Prolactin 9.4, normal TSH.   She c/o hot flashes 2-3 x a day. Night sweats 3 nights a  week.    GYNECOLOGIC HISTORY: No LMP recorded. Patient has had a hysterectomy. Contraception:hysterectomy Menopausal hormone therapy: none        OB History    Gravida  3   Para  2   Term      Preterm  2   AB  1   Living  2     SAB      TAB  1   Ectopic      Multiple      Live Births                 Patient Active Problem List   Diagnosis Date Noted  . Galactorrhea 07/19/2018  . Malnutrition of moderate degree ([place]) 07/19/2018  . Abnormal TSH 07/19/2018  . Nummular keratitis of right eye, followed by [facility] 07/19/2018  . Aortic atherosclerosis ([place]) 07/19/2018  . Murmur, cardiac 03/26/2018  . Former smoker, stopped 2019 03/26/2018  . History of hysterectomy 11/25/2016  . Unintentional weight loss 08/17/2016  . B12 deficiency 08/12/2016  . Daily nausea 08/06/2016  . Microcytic anemia 08/06/2016  . Chronic diarrhea 08/06/2016  . Intestinal malabsorption 08/06/2016  . Lung nodule < 6cm on CT 08/06/2016  . Essential hypertension, on Norvasec, Hydralazine, and HCTZ; patient ACE/ARB intolerant 2/2 cough 07/02/2013  . Atypical chest pain 07/02/2013  . Shortness of breath 07/02/2013  .  Other and unspecified ovarian cyst 04/01/2013  . Dysphagia 12/22/2011    Past Medical History:  Diagnosis Date  . Abnormal Pap smear of cervix   . Allergy   . Anemia   . Anxiety   . Arthritis   . Asthma   . Asthma   . Chest pain   . Chronic lower back pain   . Depression   . Fibroid   . GERD (gastroesophageal reflux disease)   . Hypertension   . Migraine   . Migraines   . Neck pain, chronic   . STD (sexually transmitted disease)    HSV1, in eye    Past Surgical History:  Procedure Laterality Date  . COLPOSCOPY    . ESOPHAGEAL DILATION    . ESOPHAGEAL MANOMETRY N/A 04/01/2013   Procedure: ESOPHAGEAL MANOMETRY (EM);  Surgeon: Milus Banister, MD;  Location: [facility];  Service: Endoscopy;  Laterality: N/A;  . ESOPHAGOGASTRODUODENOSCOPY (EGD) WITH  ESOPHAGEAL DILATION N/A 02/28/2013   Procedure: ESOPHAGOGASTRODUODENOSCOPY (EGD) WITH ESOPHAGEAL DILATION;  Surgeon: Milus Banister, MD;  Location: [facility];  Service: Endoscopy;  Laterality: N/A;  . TUBAL LIGATION    . VAGINAL HYSTERECTOMY      Current Outpatient Medications  Medication Sig Dispense Refill  . albuterol (PROVENTIL) (2.5 MG/3ML) 0.083% nebulizer solution Take 3 mLs (2.5 mg total) by nebulization every 4 (four) hours as needed for wheezing or shortness of breath. 150 mL 1  . amLODipine (NORVASC) 10 MG tablet Take 1 tablet (10 mg total) by mouth daily. 90 tablet 2  . clindamycin (CLEOCIN) 300 MG capsule Take 1 capsule (300 mg total) by mouth 3 (three) times daily. 21 capsule 0  . ferrous sulfate (IRON SUPPLEMENT) 325 (65 FE) MG tablet Take 325 mg by mouth daily with breakfast.    . hydrALAZINE (APRESOLINE) 25 MG tablet Take 1 tablet (25 mg total) by mouth 2 (two) times daily. 180 tablet 3  . hydrochlorothiazide (HYDRODIURIL) 25 MG tablet Take 1 tablet (25 mg total) by mouth daily. 90 tablet 1  . meloxicam (MOBIC) 15 MG tablet Take 1 tablet (15 mg total) by mouth daily. 30 tablet 0  . Multiple Vitamins-Calcium (ONE-A-DAY WOMENS FORMULA PO) Take 1 tablet by mouth daily.    Marland Kitchen omeprazole (PRILOSEC) 20 MG capsule Take 1 capsule (20 mg total) by mouth daily. 30 capsule 3  . trifluridine (VIROPTIC) 1 % ophthalmic solution Place 1 drop into the right eye 5 (five) times daily.    Marland Kitchen UNABLE TO FIND Apply to eye. Eye drops    . valACYclovir (VALTREX) 1000 MG tablet Take 1,000 mg by mouth daily.      No current facility-administered medications for this visit.      ALLERGIES: Reglan [metoclopramide]; Lisinopril; Losartan; Sulfa antibiotics; Amoxicillin; and Contrast media [iodinated diagnostic agents]  Family History  Problem Relation Age of Onset  . Heart disease Father        H/O CABG  . Hypertension Mother   . Hyperlipidemia Maternal Grandmother   . Hypertension Maternal  Grandmother   . Thyroid disease Maternal Grandmother   . Hypertension Paternal Grandmother   . Diabetes Paternal Grandmother   . Cancer - Lung Paternal Grandmother   . Breast cancer Neg Hx     Social History   Socioeconomic History  . Marital status: Married    Spouse name: [name]  . Number of children: 2  . Years of education: College  . Highest education level: Not on file  Occupational History  .  Occupation: food Public house manager: OTHER    Comment: [employer]  . Financial resource strain: Not on file  . Food insecurity:    Worry: Not on file    Inability: Not on file  . Transportation needs:    Medical: Not on file    Non-medical: Not on file  Tobacco Use  . Smoking status: Former Smoker    Packs/day: 0.10    Years: 10.00    Pack years: 1.00    Types: Cigars    Last attempt to quit: 12/06/2017    Years since quitting: 0.7  . Smokeless tobacco: Never Used  . Tobacco comment: smokes 2 Black & Milds per day 08/19/13  Substance and Sexual Activity  . Alcohol use: Not Currently  . Drug use: Not Currently  . Sexual activity: Yes    Partners: Male    Birth control/protection: Surgical    Comment: hysterectomy  Lifestyle  . Physical activity:    Days per week: Not on file    Minutes per session: Not on file  . Stress: Not on file  Relationships  . Social connections:    Talks on phone: Not on file    Gets together: Not on file    Attends religious service: Not on file    Active member of club or organization: Not on file    Attends meetings of clubs or organizations: Not on file    Relationship status: Not on file  . Intimate partner violence:    Fear of current or ex partner: Not on file    Emotionally abused: Not on file    Physically abused: Not on file    Forced sexual activity: Not on file  Other Topics Concern  . Not on file  Social History Narrative   Patient lives at home with family.   Patient goes to [employer].    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   SH: Works at [employer]. Kids are 48, son died last year at 1 in a fire.   Review of Systems  Genitourinary:       Discharge, pain with intercourse, pelvic pain going down leg    PHYSICAL EXAMINATION:    BP 110/80   Pulse 68   Temp 98.9 F (37.2 C) (Oral)   Resp 16   Wt 161 lb (73 kg)   BMI 27.64 kg/m     General appearance: alert, cooperative and appears stated age Neck: no adenopathy, supple, symmetrical, trachea midline and thyroid normal to inspection and palpation Heart: regular rate and rhythm, SEM Lungs: CTAB Abdomen: soft, non-tender; bowel sounds normal; no masses,  no organomegaly Extremities: normal, atraumatic, no cyanosis Skin: normal color, texture and turgor, no rashes or lesions Lymph: normal cervical supraclavicular and inguinal nodes Neurologic: grossly normal    Pelvic: External genitalia:  no lesions              Urethra:  normal appearing urethra with no masses, tenderness or lesions              Bartholins and Skenes: normal                 Vagina: normal appearing vagina with an increase in creamy, white vaginal discharge              Cervix: absent  Bimanual Exam:  Uterus:  uterus absent              Adnexa: no masses, minimally tender in the left adnexa. Most tender with palpation of abdominal wall during BM exam.               Rectovaginal: Yes.  .  Confirms.              Anus:  normal sphincter tone, no lesions  Pelvic floor: very tender on the left  Chaperone was present for exam.  Wet prep: no clue, no trich, few wbc KOH: no yeast PH: 4  Ultrasound report and the images from 04/03/18 reviewed  ASSESSMENT Pelvic pain, tender with palpation of the left lower abdominal wall and left pelvic floor. Negative imaging Dyspareunia, c/w pelvic floor pain Vaginal discharge, negative vaginal slides. She reports a h/o recurrent  BV Urinary pressure and hesitancy Vasomotor symptoms, c/w perimenopause     PLAN Will refer for pelvic floor and abdominal wall PT Affirm sent, if she does get recurrent BV will put on suppression. CCUA for ua, c&s She is on mobic and muscle relaxants (help some) Discussed behavioral changes for vasomotor symptoms and OTC agents Name of counselor given (lost her son last year, hasn't opened up about it yet)   An After Visit Summary was printed and given to the patient.     CC: Inda Coke, PA Note sent

## 2018-08-22 NOTE — Patient Instructions (Addendum)
Perimenopause  Perimenopause is the normal time of life before and after menstrual periods stop completely (menopause). Perimenopause can begin 2-8 years before menopause, and it usually lasts for 1 year after menopause. During perimenopause, the ovaries may or may not produce an egg. What are the causes? This condition is caused by a natural change in hormone levels that happens as you get older. What increases the risk? This condition is more likely to start at an earlier age if you have certain medical conditions or treatments, including:  A tumor of the pituitary gland in the brain.  A disease that affects the ovaries and hormone production.  Radiation treatment for cancer.  Certain cancer treatments, such as chemotherapy or hormone (anti-estrogen) therapy.  Heavy smoking and excessive alcohol use.  Family history of early menopause. What are the signs or symptoms? Perimenopausal changes affect each woman differently. Symptoms of this condition may include:  Hot flashes.  Night sweats.  Irregular menstrual periods.  Decreased sex drive.  Vaginal dryness.  Headaches.  Mood swings.  Depression.  Memory problems or trouble concentrating.  Irritability.  Tiredness.  Weight gain.  Anxiety.  Trouble getting pregnant. How is this diagnosed? This condition is diagnosed based on your medical history, a physical exam, your age, your menstrual history, and your symptoms. Hormone tests may also be done. How is this treated? In some cases, no treatment is needed. You and your health care provider should make a decision together about whether treatment is necessary. Treatment will be based on your individual condition and preferences. Various treatments are available, such as:  Menopausal hormone therapy (MHT).  Medicines to treat specific symptoms.  Acupuncture.  Vitamin or herbal supplements. Before starting treatment, make sure to let your health care provider  know if you have a personal or family history of:  Heart disease.  Breast cancer.  Blood clots.  Diabetes.  Osteoporosis. Follow these instructions at home: Lifestyle  Do not use any products that contain nicotine or tobacco, such as cigarettes and e-cigarettes. If you need help quitting, ask your health care provider.  Eat a balanced diet that includes fresh fruits and vegetables, whole grains, soybeans, eggs, lean meat, and low-fat dairy.  Get at least 30 minutes of physical activity on 5 or more days each week.  Avoid alcoholic and caffeinated beverages, as well as spicy foods. This may help prevent hot flashes.  Get 7-8 hours of sleep each night.  Dress in layers that can be removed to help you manage hot flashes.  Find ways to manage stress, such as deep breathing, meditation, or journaling. General instructions  Keep track of your menstrual periods, including: ? When they occur. ? How heavy they are and how long they last. ? How much time passes between periods.  Keep track of your symptoms, noting when they start, how often you have them, and how long they last.  Take over-the-counter and prescription medicines only as told by your health care provider.  Take vitamin supplements only as told by your health care provider. These may include calcium, vitamin E, and vitamin D.  Use vaginal lubricants or moisturizers to help with vaginal dryness and improve comfort during sex.  Talk with your health care provider before starting any herbal supplements.  Keep all follow-up visits as told by your health care provider. This is important. This includes any group therapy or counseling. Contact a health care provider if:  You have heavy vaginal bleeding or pass blood clots.  Your period   lasts more than 2 days longer than normal.  Your periods are recurring sooner than 21 days.  You bleed after having sex. Get help right away if:  You have chest pain, trouble  breathing, or trouble talking.  You have severe depression.  You have pain when you urinate.  You have severe headaches.  You have vision problems. Summary  Perimenopause is the time when a woman's body begins to move into menopause. This may happen naturally or as a result of other health problems or medical treatments.  Perimenopause can begin 2-8 years before menopause, and it usually lasts for 1 year after menopause.  Perimenopausal symptoms can be managed through medicines, lifestyle changes, and complementary therapies such as acupuncture. This information is not intended to replace advice given to you by your health care provider. Make sure you discuss any questions you have with your health care provider. Document Released: 06/23/2004 Document Revised: 06/21/2016 Document Reviewed: 06/21/2016 Elsevier Interactive Patient Education  2019 [place]. Musculoskeletal Pain Musculoskeletal pain refers to aches and pains in your bones, joints, muscles, and the tissues that surround them. This pain can occur in any part of the body. It can last for a short time (acute) or a long time (chronic). A physical exam, lab tests, and imaging studies may be done to find the cause of your musculoskeletal pain. Follow these instructions at home:  Lifestyle  Try to control or lower your stress levels. Stress increases muscle tension and can worsen musculoskeletal pain. It is important to recognize when you are anxious or stressed and learn ways to manage it. This may include: ? Meditation or yoga. ? Cognitive or behavioral therapy. ? Acupuncture or massage therapy.  You may continue all activities unless the activities cause more pain. When the pain gets better, slowly resume your normal activities. Gradually increase the intensity and duration of your activities or exercise. Managing pain, stiffness, and swelling  Take over-the-counter and prescription medicines only as told by your health  care provider.  When your pain is severe, bed rest may be helpful. Lie or sit in any position that is comfortable, but get out of bed and walk around at least every couple of hours.  If directed, apply heat to the affected area as often as told by your health care provider. Use the heat source that your health care provider recommends, such as a moist heat pack or a heating pad. ? Place a towel between your skin and the heat source. ? Leave the heat on for 20-30 minutes. ? Remove the heat if your skin turns bright red. This is especially important if you are unable to feel pain, heat, or cold. You may have a greater risk of getting burned.  If directed, put ice on the painful area. ? Put ice in a plastic bag. ? Place a towel between your skin and the bag. ? Leave the ice on for 20 minutes, 2-3 times a day. General instructions  Your health care provider may recommend that you see a physical therapist. This person can help you come up with a safe exercise program. Do any exercises as told by your physical therapist.  Keep all follow-up visits, including any physical therapy visits, as told by your health care providers. This is important. Contact a health care provider if:  Your pain gets worse.  Medicines do not help ease your pain.  You cannot use the part of your body that hurts, such as your arm, leg, or neck.  You have trouble sleeping.  You have trouble doing your normal activities. Get help right away if:  You have a new injury and your pain is worse or different.  You feel numb or you have tingling in the painful area. Summary  Musculoskeletal pain refers to aches and pains in your bones, joints, muscles, and the tissues that surround them.  This pain can occur in any part of the body.  Your health care provider may recommend that you see a physical therapist. This person can help you come up with a safe exercise program. Do any exercises as told by your physical  therapist.  Lower your stress level. Stress can worsen musculoskeletal pain. Ways to lower stress may include meditation, yoga, cognitive or behavioral therapy, acupuncture, and massage therapy. This information is not intended to replace advice given to you by your health care provider. Make sure you discuss any questions you have with your health care provider. Document Released: 05/16/2005 Document Revised: 06/15/2016 Document Reviewed: 06/15/2016 Elsevier Interactive Patient Education  2019 [employer].

## 2018-08-23 LAB — URINALYSIS, MICROSCOPIC ONLY
Bacteria, UA: NONE SEEN
Casts: NONE SEEN /lpf

## 2018-08-23 LAB — URINE CULTURE

## 2018-08-24 LAB — VAGINITIS/VAGINOSIS, DNA PROBE
Candida Species: NEGATIVE
Gardnerella vaginalis: NEGATIVE
Trichomonas vaginosis: NEGATIVE

## 2018-09-14 DIAGNOSIS — B0052 Herpesviral keratitis: Secondary | ICD-10-CM | POA: Diagnosis not present

## 2018-09-17 ENCOUNTER — Other Ambulatory Visit: Payer: Self-pay

## 2018-09-17 ENCOUNTER — Telehealth: Payer: Self-pay

## 2018-09-17 ENCOUNTER — Inpatient Hospital Stay: Payer: BLUE CROSS/BLUE SHIELD | Attending: Oncology

## 2018-09-17 DIAGNOSIS — D509 Iron deficiency anemia, unspecified: Secondary | ICD-10-CM

## 2018-09-17 DIAGNOSIS — E440 Moderate protein-calorie malnutrition: Secondary | ICD-10-CM

## 2018-09-17 DIAGNOSIS — K529 Noninfective gastroenteritis and colitis, unspecified: Secondary | ICD-10-CM

## 2018-09-17 DIAGNOSIS — K9049 Malabsorption due to intolerance, not elsewhere classified: Secondary | ICD-10-CM

## 2018-09-17 LAB — COMPREHENSIVE METABOLIC PANEL
ALT: 10 U/L (ref 0–44)
AST: 13 U/L — ABNORMAL LOW (ref 15–41)
Albumin: 3.6 g/dL (ref 3.5–5.0)
Alkaline Phosphatase: 56 U/L (ref 38–126)
Anion gap: 8 (ref 5–15)
BUN: 16 mg/dL (ref 6–20)
CO2: 26 mmol/L (ref 22–32)
Calcium: 8.9 mg/dL (ref 8.9–10.3)
Chloride: 107 mmol/L (ref 98–111)
Creatinine, Ser: 0.8 mg/dL (ref 0.44–1.00)
GFR calc Af Amer: >60 mL/min (ref >60)
GFR calc non Af Amer: >60 mL/min (ref >60)
Glucose, Bld: 91 mg/dL (ref 70–99)
Potassium: 3.5 mmol/L (ref 3.5–5.1)
Sodium: 141 mmol/L (ref 135–145)
Total Bilirubin: <0.2 mg/dL — ABNORMAL LOW (ref 0.3–1.2)
Total Protein: 6.8 g/dL (ref 6.5–8.1)

## 2018-09-17 LAB — CBC WITH DIFFERENTIAL/PLATELET
Abs Immature Granulocytes: 0.02 10*3/uL (ref 0.00–0.07)
Basophils Absolute: 0 10*3/uL (ref 0.0–0.1)
Basophils Relative: 0 %
Eosinophils Absolute: 0.2 10*3/uL (ref 0.0–0.5)
Eosinophils Relative: 3 %
HCT: 32.2 % — ABNORMAL LOW (ref 36.0–46.0)
Hemoglobin: 10 g/dL — ABNORMAL LOW (ref 12.0–15.0)
Immature Granulocytes: 0 %
Lymphocytes Relative: 32 %
Lymphs Abs: 2.2 10*3/uL (ref 0.7–4.0)
MCH: 23.8 pg — ABNORMAL LOW (ref 26.0–34.0)
MCHC: 31.1 g/dL (ref 30.0–36.0)
MCV: 76.5 fL — ABNORMAL LOW (ref 80.0–100.0)
Monocytes Absolute: 0.6 10*3/uL (ref 0.1–1.0)
Monocytes Relative: 9 %
Neutro Abs: 3.7 10*3/uL (ref 1.7–7.7)
Neutrophils Relative %: 56 %
Platelets: 242 10*3/uL (ref 150–400)
RBC: 4.21 MIL/uL (ref 3.87–5.11)
RDW: 14 % (ref 11.5–15.5)
WBC: 6.8 10*3/uL (ref 4.0–10.5)
nRBC: 0 % (ref 0.0–0.2)

## 2018-09-17 LAB — RETICULOCYTES
Immature Retic Fract: 11 % (ref 2.3–15.9)
RBC.: 4.26 MIL/uL (ref 3.87–5.11)
Retic Count, Absolute: 60.5 10*3/uL (ref 19.0–186.0)
Retic Ct Pct: 1.4 % (ref 0.4–3.1)

## 2018-09-17 LAB — C-REACTIVE PROTEIN: CRP: <0.8 mg/dL (ref <1.0)

## 2018-09-17 LAB — SEDIMENTATION RATE: Sed Rate: 14 mm/hr (ref 0–22)

## 2018-09-17 NOTE — Telephone Encounter (Signed)
Called and spoke with patient, when she was initially seen back in Dec-Jan, it was recommended that she be admitted to the hospital for 10 days to have IV Acyclovir for her eye. When she was more recently seen by ophthalmology 09/14/18 she was given oral meds. She is concerned about losing her vision if she only does the oral medication. She was wondering if she could be admitted at a hospital in [place] for this since this is where she lives.   Called Dr. Shann Medal office, they will have him call me back.   Questions from Dr. Juleen China - Clarify dose and timing? Outpatient, inpatient, or infusion center? Pushed or infused?

## 2018-09-17 NOTE — Telephone Encounter (Signed)
Called Dr Shann Medal office regarding need for IV medication. Advised that they are not affiliated with any ophthalmology office that would be closer to the pt home. Advised that Dr. Rosana Hoes would be fine with pt having treatment done at another facility closer to pt home. If Dr. Juleen China would like to speak directly with Dr. Rosana Hoes that can be arranged.   Per last OV note with Dr. Shanon Brow 09/14/18 -  Progress Notes - documented in this encounter Suzanne Slate, MD - 08/10/2018 2:15 PM EDT 43 yo non CL wearer with history of herpetic keratitis presents with 4 week history of keratitis OD  1. Herpetic keratitis OD, 2nd episode on going since 05/11/18 -has been on going since 05/11/18 -ANA+,RF+, HIV not sent, TB and FTA negative -Current gtts  Gtts: Zirgan Daily OD  PRGF QID OD Mild QD OD  Valtrex 1 gm QD  -responding to zirgan, improved 06/11/17 -patient reports marked improvement 06/15/17 but still has photophobia, will treat inflammation gradually to prevent recurrence of epithelial disease, no dendrite today -stable 06/29/2018 and quiet, no defect. -stable 07/16/2018 -Continue above, start PRGF QID (Discussed with Sharen Counter, in agreement) -f/u in 2 weeks. -Would not increase steroids as could flare herpetic keratitis, would not stop zirgan without stopping steroids - all discussed with patient.  Pred mild QOD, zirgan QOD, valtrex 1 gm, PRGF QID OD. F/u in 1 month, MRX.

## 2018-09-17 NOTE — Telephone Encounter (Signed)
Called pt and left VM that we are still waiting to hear back from her specialist (Dr. Rosana Hoes) will touch base with her in the morning.

## 2018-09-18 LAB — IRON AND TIBC
Iron: 47 ug/dL (ref 41–142)
Saturation Ratios: 19 % — ABNORMAL LOW (ref 21–57)
TIBC: 253 ug/dL (ref 236–444)
UIBC: 205 ug/dL (ref 120–384)

## 2018-09-18 LAB — FERRITIN: Ferritin: 30 ng/mL (ref 11–307)

## 2018-09-18 NOTE — Telephone Encounter (Signed)
fyi

## 2018-09-18 NOTE — Telephone Encounter (Signed)
Spoke with Dr. Rosana Hoes this morning. He advised that he does not feel like IV Acyclovir is warranted/appropriate treatment for her right now. He said that she responded well to current treatment last time. He tried to call her last night but was unable to reach her by phone. She has a follow-up scheduled with him either this Friday or next Friday. If the patient has any questions or concerns she may contact his office.

## 2018-09-26 ENCOUNTER — Telehealth: Payer: Self-pay | Admitting: Family Medicine

## 2018-09-26 NOTE — Telephone Encounter (Signed)
Copied from [place] [id number]. Topic: General - Inquiry >> Sep 26, 2018  2:39 PM Suzanne [name] wrote: Reason for CRM: Pt called stating she has white vaginal discharge and a slight odor. She believes the BV never went away completely and tried 2 medications. Pt asking if she needs a visit or another medication. Please advise.  [facility] ([address]), [place] [phone] (Phone) [phone] (Fax)

## 2018-09-26 NOTE — Telephone Encounter (Signed)
Suzanne [name], schedule patient for virtual visit, was last seen Feb for same issue.

## 2018-09-27 ENCOUNTER — Encounter: Payer: Self-pay | Admitting: Physician Assistant

## 2018-09-27 ENCOUNTER — Telehealth: Payer: Self-pay | Admitting: Oncology

## 2018-09-27 ENCOUNTER — Other Ambulatory Visit (HOSPITAL_COMMUNITY)
Admission: RE | Admit: 2018-09-27 | Discharge: 2018-09-27 | Disposition: A | Discharge status: Home / Self Care | Payer: BLUE CROSS/BLUE SHIELD | Source: Ambulatory Visit | Attending: Physician Assistant | Admitting: Physician Assistant

## 2018-09-27 ENCOUNTER — Ambulatory Visit (INDEPENDENT_AMBULATORY_CARE_PROVIDER_SITE_OTHER): Payer: BLUE CROSS/BLUE SHIELD | Admitting: Physician Assistant

## 2018-09-27 DIAGNOSIS — N898 Other specified noninflammatory disorders of vagina: Secondary | ICD-10-CM | POA: Diagnosis not present

## 2018-09-27 LAB — POCT URINALYSIS DIPSTICK
Bilirubin, UA: NEGATIVE
Blood, UA: NEGATIVE
Glucose, UA: NEGATIVE
Ketones, UA: NEGATIVE
Leukocytes, UA: NEGATIVE
Nitrite, UA: NEGATIVE
Protein, UA: NEGATIVE
Spec Grav, UA: 1.02 (ref 1.010–1.025)
Urobilinogen, UA: 1 E.U./dL
pH, UA: 6 (ref 5.0–8.0)

## 2018-09-27 MED ORDER — METRONIDAZOLE 500 MG PO TABS: 500.0000 mg | ORAL_TABLET | Freq: Two times a day (BID) | ORAL | 0 refills | Status: AC

## 2018-09-27 NOTE — Telephone Encounter (Signed)
Left vm for appt on 5/4 letting patient know it has been turned into Virtual app.  Sent email with step by step instructions how to access mtg.

## 2018-09-27 NOTE — Progress Notes (Signed)
Virtual Visit via Video   I connected with Suzanne [name] on 09/27/18 at 11:20 AM EDT by a video enabled telemedicine application and verified that I am speaking with the correct person using two identifiers. Location patient: Home Location provider: [facility], Office Persons participating in the virtual visit: Suzanne [name], Suzanne [name], Suzanne [name]   I discussed the limitations of evaluation and management by telemedicine and the availability of in person appointments. The patient expressed understanding and agreed to proceed.  Subjective:   HPI: Vaginal discharge Pt c/o white vaginal discharge with slight odor x 2 days ago again. Pt states this has been ongoing off and on x 2 months. Pt was treated with Flagyl and then clindamycin just 2 weeks ago. C/o pelvic pian left lower quadrant, cramping pressure feeling with urination. Denies back pain, fever or chills.  She saw Dr. Sumner [name] on 08/22/2018 for pelvic pain work-up. She states that her pelvic pain is about the same as it was when she saw Suzanne [name], no change.  ROS: See pertinent positives and negatives per HPI.  Patient Active Problem List   Diagnosis Date Noted  . Galactorrhea 07/19/2018  . Malnutrition of moderate degree ([place]) 07/19/2018  . Abnormal TSH 07/19/2018  . Nummular keratitis of right eye, followed by [facility] 07/19/2018  . Aortic atherosclerosis ([place]) 07/19/2018  . Murmur, cardiac 03/26/2018  . Former smoker, stopped 2019 03/26/2018  . History of hysterectomy 11/25/2016  . Unintentional weight loss 08/17/2016  . B12 deficiency 08/12/2016  . Daily nausea 08/06/2016  . Microcytic anemia 08/06/2016  . Chronic diarrhea 08/06/2016  . Intestinal malabsorption 08/06/2016  . Lung nodule < 6cm on CT 08/06/2016  . Essential hypertension, on Norvasec, Hydralazine, and HCTZ; patient ACE/ARB intolerant 2/2 cough 07/02/2013  . Atypical chest pain 07/02/2013  . Shortness of breath 07/02/2013  . Other and unspecified ovarian  cyst 04/01/2013  . Dysphagia 12/22/2011    Social History   Tobacco Use  . Smoking status: Former Smoker    Packs/day: 0.10    Years: 10.00    Pack years: 1.00    Types: Cigars    Last attempt to quit: 12/06/2017    Years since quitting: 0.8  . Smokeless tobacco: Never Used  . Tobacco comment: smokes 2 Black & Milds per day 08/19/13  Substance Use Topics  . Alcohol use: Not Currently    Current Outpatient Medications:  .  albuterol (PROVENTIL) (2.5 MG/3ML) 0.083% nebulizer solution, Take 3 mLs (2.5 mg total) by nebulization every 4 (four) hours as needed for wheezing or shortness of breath., Disp: 150 mL, Rfl: 1 .  amLODipine (NORVASC) 10 MG tablet, Take 1 tablet (10 mg total) by mouth daily., Disp: 90 tablet, Rfl: 2 .  ferrous sulfate (IRON SUPPLEMENT) 325 (65 FE) MG tablet, Take 325 mg by mouth daily with breakfast., Disp: , Rfl:  .  Ganciclovir (ZIRGAN) 0.15 % GEL, Apply 1 drop to eye 5 (five) times daily. Right eye, Disp: , Rfl:  .  hydrALAZINE (APRESOLINE) 25 MG tablet, Take 1 tablet (25 mg total) by mouth 2 (two) times daily., Disp: 180 tablet, Rfl: 3 .  hydrochlorothiazide (HYDRODIURIL) 25 MG tablet, Take 1 tablet (25 mg total) by mouth daily., Disp: 90 tablet, Rfl: 1 .  Multiple Vitamins-Calcium (ONE-A-DAY WOMENS FORMULA PO), Take 1 tablet by mouth daily., Disp: , Rfl:  .  omeprazole (PRILOSEC) 20 MG capsule, Take 1 capsule (20 mg total) by mouth daily., Disp: 30 capsule, Rfl: 3 .  prednisoLONE acetate (PRED FORTE) 1 % ophthalmic suspension, INSTILL 1 DROP INTO RIGHT EYE FIVE TIMES DAILY, Disp: , Rfl:  .  trifluridine (VIROPTIC) 1 % ophthalmic solution, Place 1 drop into the right eye 5 (five) times daily., Disp: , Rfl:  .  valACYclovir (VALTREX) 1000 MG tablet, Take 1,000 mg by mouth daily. , Disp: , Rfl:  .  metroNIDAZOLE (FLAGYL) 500 MG tablet, Take 1 tablet (500 mg total) by mouth 2 (two) times daily for 7 days., Disp: 14 tablet, Rfl: 0  Allergies  Allergen Reactions  .  Reglan [Metoclopramide] Shortness Of Breath  . Lisinopril Cough  . Losartan Cough  . Sulfa Antibiotics Nausea And Vomiting  . Amoxicillin Rash    Has patient had a PCN reaction causing immediate rash, facial/tongue/throat swelling, SOB or lightheadedness with hypotension: Yes Has patient had a PCN reaction causing severe rash involving mucus membranes or skin necrosis: Broke out face & buttocks Has patient had a PCN reaction that required hospitalization: No Has patient had a PCN reaction occurring within the last 10 years: No If all of the above answers are "NO", then may proceed with Cephalosporin use  . Contrast Media [Iodinated Diagnostic Agents] Rash    Objective:   VITALS: Per patient if applicable, see vitals. GENERAL: Alert, appears well and in no acute distress. HEENT: Atraumatic, conjunctiva clear, no obvious abnormalities on inspection of external nose and ears. NECK: Normal movements of the head and neck. CARDIOPULMONARY: No increased WOB. Speaking in clear sentences. I:E ratio WNL.  MS: Moves all visible extremities without noticeable abnormality. PSYCH: Pleasant and cooperative, well-groomed. Speech normal rate and rhythm. Affect is appropriate. Insight and judgement are appropriate. Attention is focused, linear, and appropriate.  NEURO: CN grossly intact. Oriented as arrived to appointment on time with no prompting. Moves both UE equally.  SKIN: No obvious lesions, wounds, erythema, or cyanosis noted on face or hands.  Assessment and Plan:   Suzanne [name] was seen today for vaginal discharge.  Diagnoses and all orders for this visit:  Vaginal discharge  UA negative. Will treat for presumed BV as she has had several cases of this recently. Will treat with flagyl. Will also reach out to Ob-Gyn once cervical swab has returned about whether or not it is appropriate to start suppressive treatment at at this point. -     POCT urinalysis dipstick -     Urine Culture -      Cervicovaginal ancillary only( [place])  Other orders -     metroNIDAZOLE (FLAGYL) 500 MG tablet; Take 1 tablet (500 mg total) by mouth 2 (two) times daily for 7 days.    . Reviewed expectations re: course of current medical issues. . Discussed self-management of symptoms. . Outlined signs and symptoms indicating need for more acute intervention. . Patient verbalized understanding and all questions were answered. Marland Kitchen Health Maintenance issues including appropriate healthy diet, exercise, and smoking avoidance were discussed with patient. . See orders for this visit as documented in the electronic medical record.  I discussed the assessment and treatment plan with the patient. The patient was provided an opportunity to ask questions and all were answered. The patient agreed with the plan and demonstrated an understanding of the instructions.   The patient was advised to call back or seek an in-person evaluation if the symptoms worsen or if the condition fails to improve as anticipated.    [place], Suzanne [name] 09/27/2018

## 2018-09-28 DIAGNOSIS — B0052 Herpesviral keratitis: Secondary | ICD-10-CM | POA: Diagnosis not present

## 2018-09-28 LAB — URINE CULTURE
MICRO NUMBER:: 435653
Result:: NO GROWTH
SPECIMEN QUALITY:: ADEQUATE

## 2018-09-28 LAB — CERVICOVAGINAL ANCILLARY ONLY
Bacterial vaginitis: POSITIVE — AB
Candida vaginitis: NEGATIVE
Chlamydia: NEGATIVE
Neisseria Gonorrhea: NEGATIVE
Trichomonas: NEGATIVE

## 2018-09-28 NOTE — Progress Notes (Signed)
Patient had a WebEx visit but could not be contacted.  Reviewing lab work from last month she has iron deficiency with a ferritin of 30 and low iron saturation.  We are going to replenish her iron stores and follow labs.  Very likely she also has a beta thalassemia.

## 2018-10-01 ENCOUNTER — Other Ambulatory Visit: Payer: Self-pay | Admitting: Physician Assistant

## 2018-10-01 ENCOUNTER — Encounter: Payer: Self-pay | Admitting: Physician Assistant

## 2018-10-01 ENCOUNTER — Inpatient Hospital Stay: Payer: BLUE CROSS/BLUE SHIELD | Attending: Oncology | Admitting: Oncology

## 2018-10-01 ENCOUNTER — Ambulatory Visit: Payer: BLUE CROSS/BLUE SHIELD | Admitting: Internal Medicine

## 2018-10-01 DIAGNOSIS — D509 Iron deficiency anemia, unspecified: Secondary | ICD-10-CM | POA: Insufficient documentation

## 2018-10-01 DIAGNOSIS — D561 Beta thalassemia: Secondary | ICD-10-CM

## 2018-10-01 DIAGNOSIS — D508 Other iron deficiency anemias: Secondary | ICD-10-CM

## 2018-10-01 DIAGNOSIS — D569 Thalassemia, unspecified: Secondary | ICD-10-CM | POA: Insufficient documentation

## 2018-10-01 MED ORDER — METRONIDAZOLE 0.75 % VA GEL: 1.0000 | VAGINAL | 2 refills | Status: DC

## 2018-10-02 ENCOUNTER — Telehealth: Payer: Self-pay | Admitting: Oncology

## 2018-10-02 NOTE — Telephone Encounter (Signed)
Tried to reach regarding schedule mail

## 2018-10-11 ENCOUNTER — Telehealth: Payer: Self-pay | Admitting: *Deleted

## 2018-10-11 NOTE — Telephone Encounter (Signed)
A message was left, re: follow up visit. 

## 2018-10-15 DIAGNOSIS — B0052 Herpesviral keratitis: Secondary | ICD-10-CM | POA: Diagnosis not present

## 2018-10-18 ENCOUNTER — Inpatient Hospital Stay: Payer: BLUE CROSS/BLUE SHIELD

## 2018-10-18 ENCOUNTER — Other Ambulatory Visit: Payer: Self-pay

## 2018-10-18 VITALS — BP 160/91 | HR 68 | Temp 98.0°F | Resp 18

## 2018-10-18 DIAGNOSIS — D508 Other iron deficiency anemias: Secondary | ICD-10-CM

## 2018-10-18 DIAGNOSIS — D509 Iron deficiency anemia, unspecified: Secondary | ICD-10-CM

## 2018-10-18 DIAGNOSIS — K529 Noninfective gastroenteritis and colitis, unspecified: Secondary | ICD-10-CM

## 2018-10-18 DIAGNOSIS — D561 Beta thalassemia: Secondary | ICD-10-CM

## 2018-10-18 DIAGNOSIS — E440 Moderate protein-calorie malnutrition: Secondary | ICD-10-CM

## 2018-10-18 DIAGNOSIS — K9049 Malabsorption due to intolerance, not elsewhere classified: Secondary | ICD-10-CM

## 2018-10-18 LAB — COMPREHENSIVE METABOLIC PANEL
ALT: 14 U/L (ref 0–44)
AST: 15 U/L (ref 15–41)
Albumin: 3.5 g/dL (ref 3.5–5.0)
Alkaline Phosphatase: 54 U/L (ref 38–126)
Anion gap: 7 (ref 5–15)
BUN: 15 mg/dL (ref 6–20)
CO2: 27 mmol/L (ref 22–32)
Calcium: 9.2 mg/dL (ref 8.9–10.3)
Chloride: 108 mmol/L (ref 98–111)
Creatinine, Ser: 0.78 mg/dL (ref 0.44–1.00)
GFR calc Af Amer: >60 mL/min (ref >60)
GFR calc non Af Amer: >60 mL/min (ref >60)
Glucose, Bld: 91 mg/dL (ref 70–99)
Potassium: 4.3 mmol/L (ref 3.5–5.1)
Sodium: 142 mmol/L (ref 135–145)
Total Bilirubin: <0.2 mg/dL — ABNORMAL LOW (ref 0.3–1.2)
Total Protein: 6.8 g/dL (ref 6.5–8.1)

## 2018-10-18 LAB — CBC WITH DIFFERENTIAL/PLATELET
Abs Immature Granulocytes: 0.01 10*3/uL (ref 0.00–0.07)
Basophils Absolute: 0 10*3/uL (ref 0.0–0.1)
Basophils Relative: 0 %
Eosinophils Absolute: 0.2 10*3/uL (ref 0.0–0.5)
Eosinophils Relative: 4 %
HCT: 33.2 % — ABNORMAL LOW (ref 36.0–46.0)
Hemoglobin: 10.6 g/dL — ABNORMAL LOW (ref 12.0–15.0)
Immature Granulocytes: 0 %
Lymphocytes Relative: 34 %
Lymphs Abs: 2.1 10*3/uL (ref 0.7–4.0)
MCH: 24 pg — ABNORMAL LOW (ref 26.0–34.0)
MCHC: 31.9 g/dL (ref 30.0–36.0)
MCV: 75.3 fL — ABNORMAL LOW (ref 80.0–100.0)
Monocytes Absolute: 0.5 10*3/uL (ref 0.1–1.0)
Monocytes Relative: 9 %
Neutro Abs: 3.3 10*3/uL (ref 1.7–7.7)
Neutrophils Relative %: 53 %
Platelets: 223 10*3/uL (ref 150–400)
RBC: 4.41 MIL/uL (ref 3.87–5.11)
RDW: 14.6 % (ref 11.5–15.5)
WBC: 6.3 10*3/uL (ref 4.0–10.5)
nRBC: 0 % (ref 0.0–0.2)

## 2018-10-18 MED ORDER — SODIUM CHLORIDE 0.9 % IV SOLN
INTRAVENOUS | Status: DC
  Administered 2018-10-18: 09:00:00 via INTRAVENOUS
  Filled 2018-10-18: qty 250

## 2018-10-18 MED ORDER — SODIUM CHLORIDE 0.9 % IV SOLN
510.0000 mg | Freq: Once | INTRAVENOUS | Status: AC
  Administered 2018-10-18: 510 mg via INTRAVENOUS
  Filled 2018-10-18: qty 17

## 2018-10-18 NOTE — Patient Instructions (Signed)
Ferumoxytol injection What is this medicine? FERUMOXYTOL is an iron complex. Iron is used to make healthy red blood cells, which carry oxygen and nutrients throughout the body. This medicine is used to treat iron deficiency anemia. This medicine may be used for other purposes; ask your health care provider or pharmacist if you have questions. COMMON BRAND NAME(S): Feraheme What should I tell my health care provider before I take this medicine? They need to know if you have any of these conditions: -anemia not caused by low iron levels -high levels of iron in the blood -magnetic resonance imaging (MRI) test scheduled -an unusual or allergic reaction to iron, other medicines, foods, dyes, or preservatives -pregnant or trying to get pregnant -breast-feeding How should I use this medicine? This medicine is for injection into a vein. It is given by a health care professional in a hospital or clinic setting. Talk to your pediatrician regarding the use of this medicine in children. Special care may be needed. Overdosage: If you think you have taken too much of this medicine contact a poison control center or emergency room at once. NOTE: This medicine is only for you. Do not share this medicine with others. What if I miss a dose? It is important not to miss your dose. Call your doctor or health care professional if you are unable to keep an appointment. What may interact with this medicine? This medicine may interact with the following medications: -other iron products This list may not describe all possible interactions. Give your health care provider a list of all the medicines, herbs, non-prescription drugs, or dietary supplements you use. Also tell them if you smoke, drink alcohol, or use illegal drugs. Some items may interact with your medicine. What should I watch for while using this medicine? Visit your doctor or healthcare professional regularly. Tell your doctor or healthcare professional  if your symptoms do not start to get better or if they get worse. You may need blood work done while you are taking this medicine. You may need to follow a special diet. Talk to your doctor. Foods that contain iron include: whole grains/cereals, dried fruits, beans, or peas, leafy green vegetables, and organ meats (liver, kidney). What side effects may I notice from receiving this medicine? Side effects that you should report to your doctor or health care professional as soon as possible: -allergic reactions like skin rash, itching or hives, swelling of the face, lips, or tongue -breathing problems -changes in blood pressure -feeling faint or lightheaded, falls -fever or chills -flushing, sweating, or hot feelings -swelling of the ankles or feet Side effects that usually do not require medical attention (report to your doctor or health care professional if they continue or are bothersome): -diarrhea -headache -nausea, vomiting -stomach pain This list may not describe all possible side effects. Call your doctor for medical advice about side effects. You may report side effects to FDA at 1-800-FDA-1088. Where should I keep my medicine? This drug is given in a hospital or clinic and will not be stored at home. NOTE: This sheet is a summary. It may not cover all possible information. If you have questions about this medicine, talk to your doctor, pharmacist, or health care provider.  2019 Elsevier/Gold Standard (2016-07-04 20:21:10)  Coronavirus (COVID-19) Are you at risk?  Are you at risk for the Coronavirus (COVID-19)?  To be considered HIGH RISK for Coronavirus (COVID-19), you have to meet the following criteria:  . Traveled to [place], [place], [place], [place] or   [place]; or in the [place] to [place], [place], [place], or [place]; and have fever, cough, and shortness of breath within the last 2 weeks of travel OR . Been in close contact with a person diagnosed with COVID-19  within the last 2 weeks and have fever, cough, and shortness of breath . IF YOU DO NOT MEET THESE CRITERIA, YOU ARE CONSIDERED LOW RISK FOR COVID-19.  What to do if you are HIGH RISK for COVID-19?  . If you are having a medical emergency, call 911. . Seek medical care right away. Before you go to a doctor's office, urgent care or emergency department, call ahead and tell them about your recent travel, contact with someone diagnosed with COVID-19, and your symptoms. You should receive instructions from your physician's office regarding next steps of care.  . When you arrive at healthcare provider, tell the healthcare staff immediately you have returned from visiting [place], [place], [place], [place] or [place]; or traveled in the [place] to [place], [place], [place], or [place]; in the last two weeks or you have been in close contact with a person diagnosed with COVID-19 in the last 2 weeks.   . Tell the health care staff about your symptoms: fever, cough and shortness of breath. . After you have been seen by a medical provider, you will be either: o Tested for (COVID-19) and discharged home on quarantine except to seek medical care if symptoms worsen, and asked to  - Stay home and avoid contact with others until you get your results (4-5 days)  - Avoid travel on public transportation if possible (such as bus, train, or airplane) or o Sent to the Emergency Department by EMS for evaluation, COVID-19 testing, and possible admission depending on your condition and test results.  What to do if you are LOW RISK for COVID-19?  Reduce your risk of any infection by using the same precautions used for avoiding the common cold or flu:  . Wash your hands often with soap and warm water for at least 20 seconds.  If soap and water are not readily available, use an alcohol-based hand sanitizer with at least 60% alcohol.  . If coughing or sneezing, cover your mouth and nose by coughing or sneezing  into the elbow areas of your shirt or coat, into a tissue or into your sleeve (not your hands). . Avoid shaking hands with others and consider head nods or verbal greetings only. . Avoid touching your eyes, nose, or mouth with unwashed hands.  . Avoid close contact with people who are sick. . Avoid places or events with large numbers of people in one location, like concerts or sporting events. . Carefully consider travel plans you have or are making. . If you are planning any travel outside or inside the [place], visit the CDC's Travelers' Health webpage for the latest health notices. . If you have some symptoms but not all symptoms, continue to monitor at home and seek medical attention if your symptoms worsen. . If you are having a medical emergency, call 911.   ADDITIONAL HEALTHCARE OPTIONS FOR PATIENTS  [facility] / e-Visit: [email or web address]         [facility]: [phone]  [facility]: [phone]                   [facility]: [phone]   

## 2018-10-24 LAB — HEMOGLOBINOPATHY EVALUATION
Hgb A2 Quant: 2.1 % (ref 1.8–3.2)
Hgb A: 97.9 % (ref 96.4–98.8)
Hgb C: 0 %
Hgb F Quant: 0 % (ref 0.0–2.0)
Hgb S Quant: 0 %
Hgb Variant: 0 %

## 2018-10-25 ENCOUNTER — Ambulatory Visit: Payer: BLUE CROSS/BLUE SHIELD

## 2018-10-29 ENCOUNTER — Inpatient Hospital Stay: Payer: BC Managed Care – PPO | Attending: Oncology

## 2018-10-29 ENCOUNTER — Other Ambulatory Visit: Payer: Self-pay

## 2018-10-29 VITALS — BP 144/91 | HR 66 | Temp 98.4°F | Resp 18

## 2018-10-29 DIAGNOSIS — B0052 Herpesviral keratitis: Secondary | ICD-10-CM | POA: Diagnosis not present

## 2018-10-29 DIAGNOSIS — D509 Iron deficiency anemia, unspecified: Secondary | ICD-10-CM | POA: Insufficient documentation

## 2018-10-29 DIAGNOSIS — D508 Other iron deficiency anemias: Secondary | ICD-10-CM

## 2018-10-29 MED ORDER — SODIUM CHLORIDE 0.9 % IV SOLN
510.0000 mg | Freq: Once | INTRAVENOUS | Status: AC
  Administered 2018-10-29: 510 mg via INTRAVENOUS
  Filled 2018-10-29: qty 17

## 2018-10-29 MED ORDER — SODIUM CHLORIDE 0.9 % IV SOLN
INTRAVENOUS | Status: DC
  Administered 2018-10-29: 15:00:00 via INTRAVENOUS
  Filled 2018-10-29: qty 250

## 2018-10-29 NOTE — Patient Instructions (Signed)
Ferumoxytol injection What is this medicine? FERUMOXYTOL is an iron complex. Iron is used to make healthy red blood cells, which carry oxygen and nutrients throughout the body. This medicine is used to treat iron deficiency anemia. This medicine may be used for other purposes; ask your health care provider or pharmacist if you have questions. COMMON BRAND NAME(S): Feraheme What should I tell my health care provider before I take this medicine? They need to know if you have any of these conditions: -anemia not caused by low iron levels -high levels of iron in the blood -magnetic resonance imaging (MRI) test scheduled -an unusual or allergic reaction to iron, other medicines, foods, dyes, or preservatives -pregnant or trying to get pregnant -breast-feeding How should I use this medicine? This medicine is for injection into a vein. It is given by a health care professional in a hospital or clinic setting. Talk to your pediatrician regarding the use of this medicine in children. Special care may be needed. Overdosage: If you think you have taken too much of this medicine contact a poison control center or emergency room at once. NOTE: This medicine is only for you. Do not share this medicine with others. What if I miss a dose? It is important not to miss your dose. Call your doctor or health care professional if you are unable to keep an appointment. What may interact with this medicine? This medicine may interact with the following medications: -other iron products This list may not describe all possible interactions. Give your health care provider a list of all the medicines, herbs, non-prescription drugs, or dietary supplements you use. Also tell them if you smoke, drink alcohol, or use illegal drugs. Some items may interact with your medicine. What should I watch for while using this medicine? Visit your doctor or healthcare professional regularly. Tell your doctor or healthcare professional  if your symptoms do not start to get better or if they get worse. You may need blood work done while you are taking this medicine. You may need to follow a special diet. Talk to your doctor. Foods that contain iron include: whole grains/cereals, dried fruits, beans, or peas, leafy green vegetables, and organ meats (liver, kidney). What side effects may I notice from receiving this medicine? Side effects that you should report to your doctor or health care professional as soon as possible: -allergic reactions like skin rash, itching or hives, swelling of the face, lips, or tongue -breathing problems -changes in blood pressure -feeling faint or lightheaded, falls -fever or chills -flushing, sweating, or hot feelings -swelling of the ankles or feet Side effects that usually do not require medical attention (report to your doctor or health care professional if they continue or are bothersome): -diarrhea -headache -nausea, vomiting -stomach pain This list may not describe all possible side effects. Call your doctor for medical advice about side effects. You may report side effects to FDA at 1-800-FDA-1088. Where should I keep my medicine? This drug is given in a hospital or clinic and will not be stored at home. NOTE: This sheet is a summary. It may not cover all possible information. If you have questions about this medicine, talk to your doctor, pharmacist, or health care provider.  2019 Elsevier/Gold Standard (2016-07-04 20:21:10)  Coronavirus (COVID-19) Are you at risk?  Are you at risk for the Coronavirus (COVID-19)?  To be considered HIGH RISK for Coronavirus (COVID-19), you have to meet the following criteria:  . Traveled to [place], [place], [place], [place] or   [place]; or in the [place] to [place], [place], [place], or [place]; and have fever, cough, and shortness of breath within the last 2 weeks of travel OR . Been in close contact with a person diagnosed with COVID-19  within the last 2 weeks and have fever, cough, and shortness of breath . IF YOU DO NOT MEET THESE CRITERIA, YOU ARE CONSIDERED LOW RISK FOR COVID-19.  What to do if you are HIGH RISK for COVID-19?  . If you are having a medical emergency, call 911. . Seek medical care right away. Before you go to a doctor's office, urgent care or emergency department, call ahead and tell them about your recent travel, contact with someone diagnosed with COVID-19, and your symptoms. You should receive instructions from your physician's office regarding next steps of care.  . When you arrive at healthcare provider, tell the healthcare staff immediately you have returned from visiting [place], [place], [place], [place] or [place]; or traveled in the [place] to [place], [place], [place], or [place]; in the last two weeks or you have been in close contact with a person diagnosed with COVID-19 in the last 2 weeks.   . Tell the health care staff about your symptoms: fever, cough and shortness of breath. . After you have been seen by a medical provider, you will be either: o Tested for (COVID-19) and discharged home on quarantine except to seek medical care if symptoms worsen, and asked to  - Stay home and avoid contact with others until you get your results (4-5 days)  - Avoid travel on public transportation if possible (such as bus, train, or airplane) or o Sent to the Emergency Department by EMS for evaluation, COVID-19 testing, and possible admission depending on your condition and test results.  What to do if you are LOW RISK for COVID-19?  Reduce your risk of any infection by using the same precautions used for avoiding the common cold or flu:  . Wash your hands often with soap and warm water for at least 20 seconds.  If soap and water are not readily available, use an alcohol-based hand sanitizer with at least 60% alcohol.  . If coughing or sneezing, cover your mouth and nose by coughing or sneezing  into the elbow areas of your shirt or coat, into a tissue or into your sleeve (not your hands). . Avoid shaking hands with others and consider head nods or verbal greetings only. . Avoid touching your eyes, nose, or mouth with unwashed hands.  . Avoid close contact with people who are sick. . Avoid places or events with large numbers of people in one location, like concerts or sporting events. . Carefully consider travel plans you have or are making. . If you are planning any travel outside or inside the [place], visit the CDC's Travelers' Health webpage for the latest health notices. . If you have some symptoms but not all symptoms, continue to monitor at home and seek medical attention if your symptoms worsen. . If you are having a medical emergency, call 911.   ADDITIONAL HEALTHCARE OPTIONS FOR PATIENTS  [facility] / e-Visit: [email or web address]         [facility]: [phone]  [facility]: [phone]                   [facility]: [phone]   

## 2018-11-07 ENCOUNTER — Telehealth: Payer: Self-pay

## 2018-11-07 NOTE — Telephone Encounter (Signed)
RN spoke with patient regarding "setting up infusions."  Pt has had iron infusions and per MD recommendations will follow up in August with labs.   Pt reports feeling sluggish and continue to feel run down even after her iron infusions.  Pt inquiring to see if infusions are needed prior to follow up in August.

## 2018-11-12 ENCOUNTER — Other Ambulatory Visit: Payer: Self-pay | Admitting: Oncology

## 2018-11-12 DIAGNOSIS — D509 Iron deficiency anemia, unspecified: Secondary | ICD-10-CM

## 2018-11-12 DIAGNOSIS — D563 Thalassemia minor: Secondary | ICD-10-CM

## 2018-11-12 NOTE — Progress Notes (Signed)
The patient called stating that she was feeling sluggish.  She had iron infusions May 21 and June 1.  Which we are repeating her labs and I am adding a TSH

## 2018-11-13 ENCOUNTER — Telehealth: Payer: Self-pay | Admitting: Oncology

## 2018-11-13 NOTE — Telephone Encounter (Signed)
I talk with patient regarding schedule  

## 2018-11-16 ENCOUNTER — Inpatient Hospital Stay: Payer: BC Managed Care – PPO

## 2018-11-16 ENCOUNTER — Other Ambulatory Visit: Payer: Self-pay

## 2018-11-16 DIAGNOSIS — K529 Noninfective gastroenteritis and colitis, unspecified: Secondary | ICD-10-CM

## 2018-11-16 DIAGNOSIS — K9049 Malabsorption due to intolerance, not elsewhere classified: Secondary | ICD-10-CM

## 2018-11-16 DIAGNOSIS — D509 Iron deficiency anemia, unspecified: Secondary | ICD-10-CM | POA: Diagnosis not present

## 2018-11-16 DIAGNOSIS — D563 Thalassemia minor: Secondary | ICD-10-CM

## 2018-11-16 DIAGNOSIS — E440 Moderate protein-calorie malnutrition: Secondary | ICD-10-CM

## 2018-11-16 LAB — CBC WITH DIFFERENTIAL/PLATELET
Abs Immature Granulocytes: 0.01 10*3/uL (ref 0.00–0.07)
Basophils Absolute: 0 10*3/uL (ref 0.0–0.1)
Basophils Relative: 1 %
Eosinophils Absolute: 0.2 10*3/uL (ref 0.0–0.5)
Eosinophils Relative: 4 %
HCT: 35.1 % — ABNORMAL LOW (ref 36.0–46.0)
Hemoglobin: 10.9 g/dL — ABNORMAL LOW (ref 12.0–15.0)
Immature Granulocytes: 0 %
Lymphocytes Relative: 32 %
Lymphs Abs: 2 10*3/uL (ref 0.7–4.0)
MCH: 23.9 pg — ABNORMAL LOW (ref 26.0–34.0)
MCHC: 31.1 g/dL (ref 30.0–36.0)
MCV: 76.8 fL — ABNORMAL LOW (ref 80.0–100.0)
Monocytes Absolute: 0.6 10*3/uL (ref 0.1–1.0)
Monocytes Relative: 9 %
Neutro Abs: 3.4 10*3/uL (ref 1.7–7.7)
Neutrophils Relative %: 54 %
Platelets: 219 10*3/uL (ref 150–400)
RBC: 4.57 MIL/uL (ref 3.87–5.11)
RDW: 17 % — ABNORMAL HIGH (ref 11.5–15.5)
WBC: 6.2 10*3/uL (ref 4.0–10.5)
nRBC: 0 % (ref 0.0–0.2)

## 2018-11-16 LAB — COMPREHENSIVE METABOLIC PANEL
ALT: 13 U/L (ref 0–44)
AST: 13 U/L — ABNORMAL LOW (ref 15–41)
Albumin: 3.6 g/dL (ref 3.5–5.0)
Alkaline Phosphatase: 61 U/L (ref 38–126)
Anion gap: 6 (ref 5–15)
BUN: 11 mg/dL (ref 6–20)
CO2: 27 mmol/L (ref 22–32)
Calcium: 8.7 mg/dL — ABNORMAL LOW (ref 8.9–10.3)
Chloride: 107 mmol/L (ref 98–111)
Creatinine, Ser: 0.87 mg/dL (ref 0.44–1.00)
GFR calc Af Amer: >60 mL/min (ref >60)
GFR calc non Af Amer: >60 mL/min (ref >60)
Glucose, Bld: 88 mg/dL (ref 70–99)
Potassium: 4.1 mmol/L (ref 3.5–5.1)
Sodium: 140 mmol/L (ref 135–145)
Total Bilirubin: 0.2 mg/dL — ABNORMAL LOW (ref 0.3–1.2)
Total Protein: 7 g/dL (ref 6.5–8.1)

## 2018-11-16 LAB — RETICULOCYTES
Immature Retic Fract: 9.3 % (ref 2.3–15.9)
RBC.: 4.6 MIL/uL (ref 3.87–5.11)
Retic Count, Absolute: 71.8 10*3/uL (ref 19.0–186.0)
Retic Ct Pct: 1.6 % (ref 0.4–3.1)

## 2018-11-16 LAB — IRON AND TIBC
Iron: 89 ug/dL (ref 41–142)
Saturation Ratios: 46 % (ref 21–57)
TIBC: 194 ug/dL — ABNORMAL LOW (ref 236–444)
UIBC: 105 ug/dL — ABNORMAL LOW (ref 120–384)

## 2018-11-16 LAB — FERRITIN: Ferritin: 393 ng/mL — ABNORMAL HIGH (ref 11–307)

## 2018-11-17 LAB — THYROID PANEL WITH TSH
Free Thyroxine Index: 2.2 (ref 1.2–4.9)
T3 Uptake Ratio: 28 % (ref 24–39)
T4, Total: 7.7 ug/dL (ref 4.5–12.0)
TSH: 1.22 u[IU]/mL (ref 0.450–4.500)

## 2018-11-22 ENCOUNTER — Other Ambulatory Visit (HOSPITAL_COMMUNITY)
Admission: RE | Admit: 2018-11-22 | Discharge: 2018-11-22 | Disposition: A | Discharge status: Home / Self Care | Payer: BC Managed Care – PPO | Source: Ambulatory Visit | Attending: Family Medicine | Admitting: Family Medicine

## 2018-11-22 ENCOUNTER — Ambulatory Visit (INDEPENDENT_AMBULATORY_CARE_PROVIDER_SITE_OTHER): Payer: BC Managed Care – PPO | Admitting: Family Medicine

## 2018-11-22 ENCOUNTER — Other Ambulatory Visit: Payer: Self-pay

## 2018-11-22 DIAGNOSIS — R3989 Other symptoms and signs involving the genitourinary system: Secondary | ICD-10-CM

## 2018-11-22 DIAGNOSIS — N898 Other specified noninflammatory disorders of vagina: Secondary | ICD-10-CM

## 2018-11-22 DIAGNOSIS — R309 Painful micturition, unspecified: Secondary | ICD-10-CM

## 2018-11-22 LAB — POCT URINALYSIS DIPSTICK
Bilirubin, UA: NEGATIVE
Blood, UA: NEGATIVE
Glucose, UA: NEGATIVE
Ketones, UA: NEGATIVE
Leukocytes, UA: NEGATIVE
Nitrite, UA: NEGATIVE
Protein, UA: NEGATIVE
Spec Grav, UA: 1.02 (ref 1.010–1.025)
Urobilinogen, UA: 0.2 E.U./dL
pH, UA: 6.5 (ref 5.0–8.0)

## 2018-11-22 MED ORDER — METRONIDAZOLE 500 MG PO TABS: 500.0000 mg | ORAL_TABLET | Freq: Two times a day (BID) | ORAL | 0 refills | Status: AC

## 2018-11-22 MED ORDER — CLINDAMYCIN PHOSPHATE 2 % VA CREA: 1.0000 | TOPICAL_CREAM | Freq: Every day | VAGINAL | 0 refills | Status: DC

## 2018-11-22 MED ORDER — METRONIDAZOLE 500 MG PO TABS: 500.0000 mg | ORAL_TABLET | Freq: Two times a day (BID) | ORAL | 0 refills | Status: DC

## 2018-11-22 NOTE — Addendum Note (Signed)
Addended by: Vivi Barrack on: 11/22/2018 04:39 PM   Modules accepted: Orders

## 2018-11-22 NOTE — Progress Notes (Signed)
    Chief Complaint:  Suzanne [name] is a 43 y.o. female who presents today for a virtual office visit with a chief complaint of vaginal discharge.   Assessment/Plan:  Vaginal Discharge Likely consistent with recurrent bacterial vaginosis.  Will have her come in for vaginal swab to confirm. Will send in an extended course of oral Flagyl as this is worked well for her in the past.  We will also start vaginal clindamycin as she has not yet tried this.  Also recommended starting probiotic such as fem dophilus after completing her course of antibiotics to help prevent recurrence.  Will need to follow-up with OB/GYN if continues to have issues with recurrence.    Subjective:  HPI:  Vaginal Discharge Patient was treated with a course of oral Flagyl and intravaginal metronidazole about 2 months ago for bacterial vaginosis.  Symptoms improved however over the last few weeks she has noticed recurrence in her vaginal discharge.  She thinks it is consistent with prior episodes of bacterial vaginosis.  Does not have any fever or chills.  No dysuria.  No abdominal pain.  No nausea or vomiting.  She has tried oral clindamycin in the past for previous bacterial vaginosis infections however does not think that these were particularly effective.  No other reported treatments tried.  She has not noticed any other obvious alleviating or aggravating factors.  ROS: Per HPI  PMH: She reports that she quit smoking about a year ago. Her smoking use included cigars. She has a 1.00 pack-year smoking history. She has never used smokeless tobacco. She reports previous alcohol use. She reports previous drug use.      Objective/Observations  Physical Exam: Gen: NAD, resting comfortably Pulm: Normal work of breathing Neuro: Grossly normal, moves all extremities Psych: Normal affect and thought content  Virtual Visit via Video   I connected with [name] on 11/22/18 at  4:20 PM EDT by a video enabled  telemedicine application and verified that I am speaking with the correct person using two identifiers. I discussed the limitations of evaluation and management by telemedicine and the availability of in person appointments. The patient expressed understanding and agreed to proceed.   Patient location: Home Provider location: [place] participating in the virtual visit: Myself and Patient     Algis Greenhouse. Jerline Pain, MD 11/22/2018 11:48 AM

## 2018-11-24 LAB — URINE CULTURE
MICRO NUMBER:: 607543
SPECIMEN QUALITY:: ADEQUATE

## 2018-11-27 LAB — CERVICOVAGINAL ANCILLARY ONLY
Bacterial vaginitis: POSITIVE — AB
Candida vaginitis: POSITIVE — AB
Chlamydia: NEGATIVE
Neisseria Gonorrhea: NEGATIVE
Trichomonas: NEGATIVE

## 2018-11-28 ENCOUNTER — Telehealth: Payer: Self-pay | Admitting: Obstetrics and Gynecology

## 2018-11-28 ENCOUNTER — Other Ambulatory Visit: Payer: Self-pay

## 2018-11-28 MED ORDER — FLUCONAZOLE 150 MG PO TABS: 150.0000 mg | ORAL_TABLET | Freq: Once | ORAL | 0 refills | Status: AC

## 2018-11-28 MED ORDER — CEPHALEXIN 500 MG PO CAPS: 500.0000 mg | ORAL_CAPSULE | Freq: Two times a day (BID) | ORAL | 0 refills | Status: AC

## 2018-11-28 NOTE — Progress Notes (Signed)
di

## 2018-11-28 NOTE — Telephone Encounter (Signed)
Call placed to follow up on urology referral.

## 2018-11-28 NOTE — Progress Notes (Signed)
Please inform patient of the following:  She has a UTI and yeast infection in addition to BV. She is already being treated with flagyl for the BV. Please send in a one time dose of diflucan 150mg  for her yeast infection and keflex 500mg  bid for 7 days for her UTI.  Do not need to retest unless symptoms worsen or do not improve.  Algis Greenhouse. Jerline Pain, MD 11/28/2018 7:51 AM

## 2018-11-29 ENCOUNTER — Telehealth: Payer: Self-pay

## 2018-11-29 NOTE — Telephone Encounter (Signed)
Agree with recommendations. She needs to start the antibiotics prescribed.

## 2018-11-29 NOTE — Telephone Encounter (Signed)
LM for patient to call office to schedule an office visit.

## 2018-11-29 NOTE — Telephone Encounter (Signed)
Pt returned call and front desk busy, please return the call [phone].

## 2018-11-29 NOTE — Telephone Encounter (Signed)
Copied from [place] [id number]. Topic: General - Other >> Nov 29, 2018  3:18 PM Ivar Drape wrote: Reason for CRM:   Patient would like a call back from her provider to discuss her pain.

## 2018-11-29 NOTE — Telephone Encounter (Signed)
I spoke with patient and she informed me that her pain is still at a 8 and wanted to know if she could get something else for the pain.  Patient was seen on 11/22/18, Dr. Jerline Pain.  Dr. Jerline Pain prescribed Flagyl for patient, which patient explained has not helped her pain.  I asked patient about Keflex that was prescribed to her yesterday and patient stated that she did not know about that one.  I told patient to pick Rx up and start on it for the time being.  I also informed patient that she would need another appointment to be evaluated and that I would send a message to Dr. Jerline Pain informing him of conversation.  Please advise.

## 2018-12-03 DIAGNOSIS — H04121 Dry eye syndrome of right lacrimal gland: Secondary | ICD-10-CM | POA: Diagnosis not present

## 2019-01-14 DIAGNOSIS — H179 Unspecified corneal scar and opacity: Secondary | ICD-10-CM | POA: Diagnosis not present

## 2019-01-14 DIAGNOSIS — H04121 Dry eye syndrome of right lacrimal gland: Secondary | ICD-10-CM | POA: Diagnosis not present

## 2019-01-14 DIAGNOSIS — B0052 Herpesviral keratitis: Secondary | ICD-10-CM | POA: Diagnosis not present

## 2019-01-20 NOTE — Progress Notes (Signed)
[place]  [phone] [phone]     ID: Suzanne [name] DOB: 1976-04-17  MR#: [id number]  [id number]  Patient Care Team: Briscoe Deutscher, DO as PCP - General (Family Medicine) Tim Corriher, Virgie Dad, MD as Consulting Physician (Hematology and Oncology) Sena Slate, MD as Referring Physician (Ophthalmology) Erlene Quan, PA-C as Physician Assistant (Cardiology) Alda Berthold, DO as Consulting Physician (Neurology) OTHER MD:   CHIEF COMPLAINT: Microcytic anemia  CURRENT TREATMENT: Observation  INTERVAL HISTORY: Suzanne [name] returns today for follow up of her microcytic anemia. She was last seen here on 08/07/2018. She could not be reached for her WebEx visit scheduled on 10/01/2018.  Her most recent iron infusion was on 10/29/2018.  She tolerated that with no side effects that she is aware of.   REVIEW OF SYSTEMS: Suzanne [name] feels weak and tired.  She sleeps okay most nights but wakes up like she has not slept.  She is working, as a Quarry manager.  There is very little pleasure in life.  Recall she lost a son last year.  Her husband works in [employer] and they are both keeping as straight virus precautions as it is possible for them.  There has been no bleeding or bruising since the last visit.  A detailed review of systems today was otherwise stable.  HISTORY OF CURRENT ILLNESS: Suzanne [name] is referred today for evaluation of microcytic anemia with unintentional weight loss.  As far as the weight loss is concerned the patient weighed 217 pounds 08/15/2013.  Her weight dropped to 157 by 12/24/2015.  Her weight dropped further to 144 pounds as of 08/12/2016, with the lowest reading on record being 136.6 on 11/25/2016.  Since that time she has been gaining weight so that today her weight is 160 pounds.  We have lab work on her dating back to 09/15/2010.  At that time her hemoglobin was 11.7 and her MCV 71.7.  Platelets were 259 and white cells slightly  elevated at 10.9.  In subsequent years the hemoglobin has been as low as 9.6, and as high as 13.6 (post transfusion?) with most values in the 11.0, 0.5 range.  Specifically the hemoglobin has varied widely as follows:  Results for PERFECTA, DUFAULT (MRN [id number]) as of 08/07/2018 10:33  Ref. Range 02/26/2013 16:35 07/19/2013 11:38 07/21/2014 03:12 07/18/2015 16:47 05/19/2016 08:04 08/03/2016 11:00 11/25/2016 11:28 11/03/2017 14:14 12/11/2017 11:34 04/02/2018 16:23 05/12/2018 03:23 07/18/2018 09:51 08/07/2018 09:41  Hemoglobin Latest Ref Range: 12.0 - 15.0 g/dL 11.6 (L) 11.0 (L) 9.7 (L) 13.6 11.5 (L) 10.8 (L) 11.4 (L) 11.0 (L) 12.0 10.7 (L) 12.7 9.7 (L) 9.6 (L)   No MCV has been higher than 78.2, with most readings in the low 70s.  She takes iron supplementation daily, and tells me she has not missed any doses.  She does not have significant problems from the oral iron.  Ferritin has been in the low normal range:  Results for SURAI, PLESS (MRN [id number]) as of 08/07/2018 10:33  Ref. Range 12/04/2017 10:02 07/18/2018 09:51  Ferritin Latest Ref Range: 16 - 232 ng/mL 50 61    The patient's subsequent history is as detailed below.   PAST MEDICAL HISTORY: Past Medical History:  Diagnosis Date  . Abnormal Pap smear of cervix   . Allergy   . Anemia   . Anxiety   . Arthritis   . Asthma   . Asthma   . Chest pain   . Chronic lower back pain   .  Depression   . Fibroid   . GERD (gastroesophageal reflux disease)   . Hypertension   . Migraine   . Migraines   . Neck pain, chronic   . STD (sexually transmitted disease)    HSV1, in eye    PAST SURGICAL HISTORY: Past Surgical History:  Procedure Laterality Date  . COLPOSCOPY    . ESOPHAGEAL DILATION    . ESOPHAGEAL MANOMETRY N/A 04/01/2013   Procedure: ESOPHAGEAL MANOMETRY (EM);  Surgeon: Milus Banister, MD;  Location: [facility];  Service: Endoscopy;  Laterality: N/A;  . ESOPHAGOGASTRODUODENOSCOPY (EGD) WITH ESOPHAGEAL DILATION N/A 02/28/2013    Procedure: ESOPHAGOGASTRODUODENOSCOPY (EGD) WITH ESOPHAGEAL DILATION;  Surgeon: Milus Banister, MD;  Location: [facility];  Service: Endoscopy;  Laterality: N/A;  . TUBAL LIGATION    . VAGINAL HYSTERECTOMY       FAMILY HISTORY: Family History  Problem Relation Age of Onset  . Heart disease Father        H/O CABG  . Hypertension Mother   . Hypertension Maternal Grandmother   . Thyroid disease Maternal Grandmother   . Diabetes Maternal Grandmother   . Hypertension Paternal Grandmother   . Diabetes Paternal Grandmother   . Cancer - Lung Paternal Grandmother   . Hypertension Maternal Aunt   . Thyroid disease Maternal Aunt    Suzanne [name]'s father died age 73. Patients' mother died at age 5. The patient has 3 brothers and 4 sisters. Patient denies anyone in her family having breast, ovarian, prostate, or pancreatic cancer. Suzanne [name] notes that her paternal grandmother was diagnosed with lung cancer. She also notes that her grandmother was prescribed blood thinners.    GYNECOLOGIC HISTORY:  No LMP recorded. Patient has had a hysterectomy. 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 Her husband, Seresa Moise, is a meat cutter. Suzanne [name] daughter is 2, works at [employer], and attends school full time. Suzanne [name] lost a son at the age of 4 to a house fire in 08/2017. Suzanne [name] does not attend a church, Glass blower/designer, or mosque.    ADVANCED DIRECTIVES: Suzanne [name]'s husband, Patrika Rhoads, is automatically her healthcare power of attorney.     HEALTH MAINTENANCE: Social History   Tobacco Use  . Smoking status: Former Smoker    Packs/day: 0.10    Years: 10.00    Pack years: 1.00    Types: Cigars    Quit date: 12/06/2017    Years since quitting: 1.1  . Smokeless tobacco: Never Used  . Tobacco comment: smokes 2 Black  & Milds per day 08/19/13  Substance Use Topics  . Alcohol use: Not Currently  . Drug use: Not Currently    Colonoscopy: n/a  PAP: referral to GYN in process  Bone density: n/a   Allergies  Allergen Reactions  . Reglan [Metoclopramide] Shortness Of Breath  . Lisinopril Cough  . Losartan Cough  . Sulfa Antibiotics Nausea And Vomiting  . Amoxicillin Rash    Has patient had a PCN reaction causing immediate rash, facial/tongue/throat swelling, SOB or lightheadedness with hypotension: Yes Has patient had a PCN reaction causing severe rash involving mucus membranes or skin necrosis: Broke out face & buttocks Has patient had a PCN reaction that required hospitalization: No Has patient had a PCN reaction occurring within the last 10 years: No If all of the above answers are "NO", then may  proceed with Cephalosporin use  . Contrast Media [Iodinated Diagnostic Agents] Rash    Current Outpatient Medications  Medication Sig Dispense Refill  . albuterol (PROVENTIL) (2.5 MG/3ML) 0.083% nebulizer solution Take 3 mLs (2.5 mg total) by nebulization every 4 (four) hours as needed for wheezing or shortness of breath. 150 mL 1  . amLODipine (NORVASC) 10 MG tablet Take 1 tablet (10 mg total) by mouth daily. 90 tablet 2  . clindamycin (CLEOCIN) 2 % vaginal cream Place 1 Applicatorful vaginally at bedtime. 40 g 0  . ferrous sulfate (IRON SUPPLEMENT) 325 (65 FE) MG tablet Take 325 mg by mouth daily with breakfast.    . Ganciclovir (ZIRGAN) 0.15 % GEL Apply 1 drop to eye 5 (five) times daily. Right eye    . hydrALAZINE (APRESOLINE) 25 MG tablet Take 1 tablet (25 mg total) by mouth 2 (two) times daily. 180 tablet 3  . hydrochlorothiazide (HYDRODIURIL) 25 MG tablet Take 1 tablet (25 mg total) by mouth daily. 90 tablet 1  . metroNIDAZOLE (METROGEL) 0.75 % vaginal gel Place 1 Applicatorful vaginally 2 (two) times a week. 70 g 2  . Multiple Vitamins-Calcium (ONE-A-DAY WOMENS FORMULA PO) Take 1 tablet by mouth  daily.    Marland Kitchen omeprazole (PRILOSEC) 20 MG capsule Take 1 capsule (20 mg total) by mouth daily. 30 capsule 3  . prednisoLONE acetate (PRED FORTE) 1 % ophthalmic suspension INSTILL 1 DROP INTO RIGHT EYE FIVE TIMES DAILY    . trifluridine (VIROPTIC) 1 % ophthalmic solution Place 1 drop into the right eye 5 (five) times daily.    . valACYclovir (VALTREX) 1000 MG tablet Take 1,000 mg by mouth daily.      No current facility-administered medications for this visit.      OBJECTIVE: Young African-American woman appears stated age  43:   01/21/19 1158  BP: (!) 159/89  Pulse: (!) 58  Resp: 17  Temp: 99.1 F (37.3 C)  SpO2: 100%     Body mass index is 26.19 kg/m.   Wt Readings from Last 3 Encounters:  01/21/19 152 lb 9.6 oz (69.2 kg)  08/22/18 161 lb (73 kg)  08/08/18 159 lb (72.1 kg)      ECOG FS:1 - Symptomatic but completely ambulatory  Sclerae unicteric, EOMs intact Wearing a mask No cervical or supraclavicular adenopathy Lungs no rales or rhonchi Heart regular rate and rhythm Abd soft, nontender, positive bowel sounds MSK no focal spinal tenderness, no upper extremity lymphedema Neuro: nonfocal, well oriented, appropriate affect Breasts: Deferred   LAB RESULTS:  CMP     Component Value Date/Time   NA 142 01/21/2019 1137   K 4.1 01/21/2019 1137   CL 106 01/21/2019 1137   CO2 28 01/21/2019 1137   GLUCOSE 89 01/21/2019 1137   BUN 12 01/21/2019 1137   CREATININE 0.81 01/21/2019 1137   CREATININE 0.79 08/07/2018 0941   CREATININE 0.58 07/19/2013 1138   CALCIUM 9.2 01/21/2019 1137   PROT 7.0 01/21/2019 1137   ALBUMIN 3.8 01/21/2019 1137   AST 16 01/21/2019 1137   AST 22 08/07/2018 0941   ALT 21 01/21/2019 1137   ALT 19 08/07/2018 0941   ALKPHOS 57 01/21/2019 1137   BILITOT 0.3 01/21/2019 1137   BILITOT 0.3 08/07/2018 0941   GFRNONAA >60 01/21/2019 1137   GFRNONAA >60 08/07/2018 0941   GFRAA >60 01/21/2019 1137   GFRAA >60 08/07/2018 0941    No results  found for: TOTALPROTELP, ALBUMINELP, A1GS, A2GS, BETS, BETA2SER, GAMS, MSPIKE, SPEI  No  results found for: Nils Pyle, [facility]  Lab Results  Component Value Date   WBC 6.7 01/21/2019   NEUTROABS 4.3 01/21/2019   HGB 11.3 (L) 01/21/2019   HCT 36.1 01/21/2019   MCV 76.6 (L) 01/21/2019   PLT 259 01/21/2019    @LASTCHEMISTRY @  No results found for: LABCA2  No components found for: [id number]  No results for input(s): INR in the last 168 hours.  No results found for: LABCA2  No results found for: [id number]  No results found for: [id number]  No results found for: [id number]  No results found for: CA2729  No components found for: HGQUANT  No results found for: CEA1 / No results found for: CEA1   No results found for: AFPTUMOR  No results found for: CHROMOGRNA  No results found for: PSA1  Appointment on 01/21/2019  Component Date Value Ref Range Status  . Retic Ct Pct 01/21/2019 1.7  0.4 - 3.1 % Final  . RBC. 01/21/2019 4.68  3.87 - 5.11 MIL/uL Final  . Retic Count, Absolute 01/21/2019 80.0  19.0 - 186.0 K/uL Final  . Immature Retic Fract 01/21/2019 10.6  2.3 - 15.9 % Final   Performed at [facility], [place] [address], [place], [place] [address]  . Sodium 01/21/2019 142  135 - 145 mmol/L Final  . Potassium 01/21/2019 4.1  3.5 - 5.1 mmol/L Final  . Chloride 01/21/2019 106  98 - 111 mmol/L Final  . CO2 01/21/2019 28  22 - 32 mmol/L Final  . Glucose, Bld 01/21/2019 89  70 - 99 mg/dL Final  . BUN 01/21/2019 12  6 - 20 mg/dL Final  . Creatinine, Ser 01/21/2019 0.81  0.44 - 1.00 mg/dL Final  . Calcium 01/21/2019 9.2  8.9 - 10.3 mg/dL Final  . Total Protein 01/21/2019 7.0  6.5 - 8.1 g/dL Final  . Albumin 01/21/2019 3.8  3.5 - 5.0 g/dL Final  . AST 01/21/2019 16  15 - 41 U/L Final  . ALT 01/21/2019 21  0 - 44 U/L Final  . Alkaline Phosphatase 01/21/2019 57  38 - 126 U/L Final  . Total Bilirubin 01/21/2019 0.3  0.3 - 1.2 mg/dL Final  . GFR  calc non Af Amer 01/21/2019 >60  >60 mL/min Final  . GFR calc Af Amer 01/21/2019 >60  >60 mL/min Final  . Anion gap 01/21/2019 8  5 - 15 Final   Performed at [facility], [place] [address], [place], [place] [address]  . WBC 01/21/2019 6.7  4.0 - 10.5 K/uL Final  . RBC 01/21/2019 4.71  3.87 - 5.11 MIL/uL Final  . Hemoglobin 01/21/2019 11.3* 12.0 - 15.0 g/dL Final  . HCT 01/21/2019 36.1  36.0 - 46.0 % Final  . MCV 01/21/2019 76.6* 80.0 - 100.0 fL Final  . MCH 01/21/2019 24.0* 26.0 - 34.0 pg Final  . MCHC 01/21/2019 31.3  30.0 - 36.0 g/dL Final  . RDW 01/21/2019 15.7* 11.5 - 15.5 % Final  . Platelets 01/21/2019 259  150 - 400 K/uL Final  . nRBC 01/21/2019 0.0  0.0 - 0.2 % Final  . Neutrophils Relative % 01/21/2019 63  % Final  . Neutro Abs 01/21/2019 4.3  1.7 - 7.7 K/uL Final  . Lymphocytes Relative 01/21/2019 25  % Final  . Lymphs Abs 01/21/2019 1.7  0.7 - 4.0 K/uL Final  . Monocytes Relative 01/21/2019 9  % Final  . Monocytes Absolute 01/21/2019 0.6  0.1 - 1.0 K/uL Final  .  Eosinophils Relative 01/21/2019 3  % Final  . Eosinophils Absolute 01/21/2019 0.2  0.0 - 0.5 K/uL Final  . Basophils Relative 01/21/2019 0  % Final  . Basophils Absolute 01/21/2019 0.0  0.0 - 0.1 K/uL Final  . Immature Granulocytes 01/21/2019 0  % Final  . Abs Immature Granulocytes 01/21/2019 0.01  0.00 - 0.07 K/uL Final  . Target Cells 01/21/2019 PRESENT   Final   Performed at [facility], [place] [address], [place], [place] [address]    (this displays the last labs from the last 3 days)  No results found for: TOTALPROTELP, ALBUMINELP, A1GS, A2GS, BETS, BETA2SER, GAMS, MSPIKE, SPEI (this displays SPEP labs)  No results found for: KPAFRELGTCHN, LAMBDASER, KAPLAMBRATIO (kappa/lambda light chains)  Lab Results  Component Value Date   HGBA 97.9 10/18/2018   HGBA2QUANT 2.1 10/18/2018   HGBFQUANT 0.0 10/18/2018   HGBSQUAN 0.0 10/18/2018   (Hemoglobinopathy  evaluation)   No results found for: LDH  Lab Results  Component Value Date   IRON 89 11/16/2018   TIBC 194 (L) 11/16/2018   IRONPCTSAT 46 11/16/2018   (Iron and TIBC)  Lab Results  Component Value Date   FERRITIN 393 (H) 11/16/2018    Urinalysis    Component Value Date/Time   COLORURINE STRAW (A) 05/12/2018 0503   APPEARANCEUR CLEAR 05/12/2018 0503   LABSPEC 1.006 05/12/2018 0503   PHURINE 7.0 05/12/2018 0503   GLUCOSEU NEGATIVE 05/12/2018 0503   HGBUR NEGATIVE 05/12/2018 0503   BILIRUBINUR Negative 11/22/2018 1639   KETONESUR NEGATIVE 05/12/2018 0503   PROTEINUR Negative 11/22/2018 1639   PROTEINUR NEGATIVE 05/12/2018 0503   UROBILINOGEN 0.2 11/22/2018 1639   UROBILINOGEN 0.2 02/19/2014 1213   NITRITE Negative 11/22/2018 1639   NITRITE NEGATIVE 05/12/2018 0503   LEUKOCYTESUR Negative 11/22/2018 1639   STUDIES:  No results found.  ASSESSMENT: 43 y.o. [place], [place] woman with a long history of microcytic anemia, no MCV higher than 78 (even when hemoglobin in normal range), on oral iron supplementation with low- normal ferritin studies.  (1) status post Feraheme infusions 10/18/2018 and 10/29/2018.  (a) ferritin rose from 30 on 09/17/2018 to 393 on 11/16/2018  (b) globin improved from 9.7 on 07/18/2018 to 11.3 on 01/21/2019  (2) thalassemia: MCV in mid 70's despite normal ferritin  (3) situational depression: venlafaxine started 01/21/2019   PLAN: Continues hemoglobin has significantly improved after iron infusion.  Recall she is status post hysterectomy, and she has no overt bleeding.  At this point I do not think continuing the iron pills would be helpful--they are causing her stomach cramps--and we will initiate follow-up.  I think she is depressed.  Losing a child is about as difficult as it gets.  She is managing to work and continue a more or less normal life but she is grieving.  I think if we started venlafaxine she might be able to get through the day with  less effort and perhaps be able to enjoy some simple things like eating or watching something on TV.  A secondary benefit is that venlafaxine is very good for hot flashes.  She is having those already meaning that she is entering menopause  Her MCV remains in the low side despite her correction of the iron.  This means she likely has thalassemia.  It is important to note that chiefly so that it is not assumed that because she has microcytosis she is iron deficient.  To establish iron deficient in her case will require checking  the ferritin and other iron studies  She will return in 3 months for labs and in 6 months for visit and labs.  She knows to call for any other issue that may develop before the next visit.  Zethan Alfieri, Virgie Dad, MD  01/21/19 12:26 PM Medical Oncology and Hematology [facility] [address] [place], [place] [address] Tel. [phone]    Fax. [phone]    I, Wilburn Mylar, am acting as scribe for Dr. Virgie Dad. Ariadna Setter.  I, Lurline Del MD, have reviewed the above documentation for accuracy and completeness, and I agree with the above.

## 2019-01-21 ENCOUNTER — Other Ambulatory Visit: Payer: Self-pay

## 2019-01-21 ENCOUNTER — Inpatient Hospital Stay (HOSPITAL_BASED_OUTPATIENT_CLINIC_OR_DEPARTMENT_OTHER): Payer: BC Managed Care – PPO | Admitting: Oncology

## 2019-01-21 ENCOUNTER — Inpatient Hospital Stay: Payer: BC Managed Care – PPO | Attending: Oncology

## 2019-01-21 VITALS — BP 159/89 | HR 58 | Temp 99.1°F | Resp 17 | Ht 64.0 in | Wt 152.6 lb

## 2019-01-21 DIAGNOSIS — Z79899 Other long term (current) drug therapy: Secondary | ICD-10-CM | POA: Insufficient documentation

## 2019-01-21 DIAGNOSIS — D563 Thalassemia minor: Secondary | ICD-10-CM

## 2019-01-21 DIAGNOSIS — Z90710 Acquired absence of both cervix and uterus: Secondary | ICD-10-CM | POA: Insufficient documentation

## 2019-01-21 DIAGNOSIS — R911 Solitary pulmonary nodule: Secondary | ICD-10-CM

## 2019-01-21 DIAGNOSIS — D569 Thalassemia, unspecified: Secondary | ICD-10-CM | POA: Diagnosis not present

## 2019-01-21 DIAGNOSIS — D509 Iron deficiency anemia, unspecified: Secondary | ICD-10-CM

## 2019-01-21 DIAGNOSIS — D508 Other iron deficiency anemias: Secondary | ICD-10-CM | POA: Diagnosis not present

## 2019-01-21 DIAGNOSIS — K9049 Malabsorption due to intolerance, not elsewhere classified: Secondary | ICD-10-CM

## 2019-01-21 DIAGNOSIS — E440 Moderate protein-calorie malnutrition: Secondary | ICD-10-CM | POA: Diagnosis not present

## 2019-01-21 DIAGNOSIS — I10 Essential (primary) hypertension: Secondary | ICD-10-CM | POA: Insufficient documentation

## 2019-01-21 DIAGNOSIS — F329 Major depressive disorder, single episode, unspecified: Secondary | ICD-10-CM | POA: Diagnosis not present

## 2019-01-21 DIAGNOSIS — IMO0001 Reserved for inherently not codable concepts without codable children: Secondary | ICD-10-CM

## 2019-01-21 DIAGNOSIS — K529 Noninfective gastroenteritis and colitis, unspecified: Secondary | ICD-10-CM

## 2019-01-21 DIAGNOSIS — Z87891 Personal history of nicotine dependence: Secondary | ICD-10-CM | POA: Diagnosis not present

## 2019-01-21 LAB — COMPREHENSIVE METABOLIC PANEL
ALT: 21 U/L (ref 0–44)
AST: 16 U/L (ref 15–41)
Albumin: 3.8 g/dL (ref 3.5–5.0)
Alkaline Phosphatase: 57 U/L (ref 38–126)
Anion gap: 8 (ref 5–15)
BUN: 12 mg/dL (ref 6–20)
CO2: 28 mmol/L (ref 22–32)
Calcium: 9.2 mg/dL (ref 8.9–10.3)
Chloride: 106 mmol/L (ref 98–111)
Creatinine, Ser: 0.81 mg/dL (ref 0.44–1.00)
GFR calc Af Amer: >60 mL/min (ref >60)
GFR calc non Af Amer: >60 mL/min (ref >60)
Glucose, Bld: 89 mg/dL (ref 70–99)
Potassium: 4.1 mmol/L (ref 3.5–5.1)
Sodium: 142 mmol/L (ref 135–145)
Total Bilirubin: 0.3 mg/dL (ref 0.3–1.2)
Total Protein: 7 g/dL (ref 6.5–8.1)

## 2019-01-21 LAB — CBC WITH DIFFERENTIAL/PLATELET
Abs Immature Granulocytes: 0.01 10*3/uL (ref 0.00–0.07)
Basophils Absolute: 0 10*3/uL (ref 0.0–0.1)
Basophils Relative: 0 %
Eosinophils Absolute: 0.2 10*3/uL (ref 0.0–0.5)
Eosinophils Relative: 3 %
HCT: 36.1 % (ref 36.0–46.0)
Hemoglobin: 11.3 g/dL — ABNORMAL LOW (ref 12.0–15.0)
Immature Granulocytes: 0 %
Lymphocytes Relative: 25 %
Lymphs Abs: 1.7 10*3/uL (ref 0.7–4.0)
MCH: 24 pg — ABNORMAL LOW (ref 26.0–34.0)
MCHC: 31.3 g/dL (ref 30.0–36.0)
MCV: 76.6 fL — ABNORMAL LOW (ref 80.0–100.0)
Monocytes Absolute: 0.6 10*3/uL (ref 0.1–1.0)
Monocytes Relative: 9 %
Neutro Abs: 4.3 10*3/uL (ref 1.7–7.7)
Neutrophils Relative %: 63 %
Platelets: 259 10*3/uL (ref 150–400)
RBC: 4.71 MIL/uL (ref 3.87–5.11)
RDW: 15.7 % — ABNORMAL HIGH (ref 11.5–15.5)
WBC: 6.7 10*3/uL (ref 4.0–10.5)
nRBC: 0 % (ref 0.0–0.2)

## 2019-01-21 LAB — RETICULOCYTES
Immature Retic Fract: 10.6 % (ref 2.3–15.9)
RBC.: 4.68 MIL/uL (ref 3.87–5.11)
Retic Count, Absolute: 80 10*3/uL (ref 19.0–186.0)
Retic Ct Pct: 1.7 % (ref 0.4–3.1)

## 2019-01-21 LAB — FERRITIN: Ferritin: 311 ng/mL — ABNORMAL HIGH (ref 11–307)

## 2019-01-21 LAB — IRON AND TIBC
Iron: 90 ug/dL (ref 41–142)
Saturation Ratios: 54 % (ref 21–57)
TIBC: 169 ug/dL — ABNORMAL LOW (ref 236–444)
UIBC: 78 ug/dL — ABNORMAL LOW (ref 120–384)

## 2019-01-21 MED ORDER — VENLAFAXINE HCL ER 75 MG PO CP24: 75.0000 mg | ORAL_CAPSULE | Freq: Every day | ORAL | 4 refills | Status: DC

## 2019-01-22 ENCOUNTER — Telehealth: Payer: Self-pay | Admitting: Oncology

## 2019-01-22 NOTE — Telephone Encounter (Signed)
I left a message regarding schedule  

## 2019-02-26 ENCOUNTER — Encounter: Payer: Self-pay | Admitting: Physician Assistant

## 2019-02-26 ENCOUNTER — Ambulatory Visit (INDEPENDENT_AMBULATORY_CARE_PROVIDER_SITE_OTHER): Payer: BC Managed Care – PPO | Admitting: Physician Assistant

## 2019-02-26 ENCOUNTER — Other Ambulatory Visit: Payer: Self-pay

## 2019-02-26 VITALS — BP 140/96 | HR 69 | Temp 98.4°F | Ht 64.0 in

## 2019-02-26 DIAGNOSIS — R1031 Right lower quadrant pain: Secondary | ICD-10-CM

## 2019-02-26 DIAGNOSIS — M25551 Pain in right hip: Secondary | ICD-10-CM | POA: Diagnosis not present

## 2019-02-26 NOTE — Patient Instructions (Addendum)
[place], [place], [place] [address]  Go now, open until 5p.  Follow signs for [facility]  I will wait on sending the prednisone, as they may want to do something different.  Raliegh Ip has an ortho ugent care 5:30-9 [address] [place], [place] [address]

## 2019-02-26 NOTE — Progress Notes (Signed)
Suzanne [name] is a 43 y.o. female here for a new problem.  I acted as a Education administrator for [employer], PA-C [employer], LPN  History of Present Illness:   Chief Complaint  Patient presents with  . thigh pain    HPI   Thigh pain Pt c/o Rt thigh pain, started yesterday came on all of a sudden while she was running around chasing her niece.  Denies any specific event where her pain started.  Pt is not able to stand if she does stands on her tip toes. She has tried Flexeril and Aleve no help.  She reports that her pain is severe.  She denies any changes to bowel, bladder, other GU systems.  Denies any unusual infection and swollen lymph nodes.  Has very limited range of motion.  Past Medical History:  Diagnosis Date  . Abnormal Pap smear of cervix   . Allergy   . Anemia   . Anxiety   . Arthritis   . Asthma   . Asthma   . Chest pain   . Chronic lower back pain   . Depression   . Fibroid   . GERD (gastroesophageal reflux disease)   . Hypertension   . Migraine   . Migraines   . Neck pain, chronic   . STD (sexually transmitted disease)    HSV1, in eye     Social History   Socioeconomic History  . Marital status: Married    Spouse name: [name]  . Number of children: 2  . Years of education: College  . Highest education level: Not on file  Occupational History  . Occupation: food Public house manager: OTHER    Comment: [place]  . Financial resource strain: Not on file  . Food insecurity    Worry: Not on file    Inability: Not on file  . Transportation needs    Medical: Not on file    Non-medical: Not on file  Tobacco Use  . Smoking status: Former Smoker    Packs/day: 0.10    Years: 10.00    Pack years: 1.00    Types: Cigars    Quit date: 12/06/2017    Years since quitting: 1.2  . Smokeless tobacco: Never Used  . Tobacco comment: smokes 2 Black & Milds per day 08/19/13  Substance and Sexual Activity  . Alcohol use: Not Currently   . Drug use: Not Currently  . Sexual activity: Yes    Partners: Male    Birth control/protection: Surgical    Comment: hysterectomy  Lifestyle  . Physical activity    Days per week: Not on file    Minutes per session: Not on file  . Stress: Not on file  Relationships  . Social Herbalist on phone: Not on file    Gets together: Not on file    Attends religious service: Not on file    Active member of club or organization: Not on file    Attends meetings of clubs or organizations: Not on file    Relationship status: Not on file  . Intimate partner violence    Fear of current or ex partner: Not on file    Emotionally abused: Not on file    Physically abused: Not on file    Forced sexual activity: Not on file  Other Topics Concern  . Not on file  Social History Narrative   Patient lives at home with family.  Patient goes to [employer].   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    Past Surgical History:  Procedure Laterality Date  . COLPOSCOPY    . ESOPHAGEAL DILATION    . ESOPHAGEAL MANOMETRY N/A 04/01/2013   Procedure: ESOPHAGEAL MANOMETRY (EM);  Surgeon: Milus Banister, MD;  Location: [facility];  Service: Endoscopy;  Laterality: N/A;  . ESOPHAGOGASTRODUODENOSCOPY (EGD) WITH ESOPHAGEAL DILATION N/A 02/28/2013   Procedure: ESOPHAGOGASTRODUODENOSCOPY (EGD) WITH ESOPHAGEAL DILATION;  Surgeon: Milus Banister, MD;  Location: [facility];  Service: Endoscopy;  Laterality: N/A;  . TUBAL LIGATION    . VAGINAL HYSTERECTOMY      Family History  Problem Relation Age of Onset  . Heart disease Father        H/O CABG  . Hypertension Mother   . Hypertension Maternal Grandmother   . Thyroid disease Maternal Grandmother   . Diabetes Maternal Grandmother   . Hypertension Paternal Grandmother   . Diabetes Paternal Grandmother   . Cancer - Lung Paternal Grandmother   . Hypertension Maternal Aunt   . Thyroid  disease Maternal Aunt     Allergies  Allergen Reactions  . Reglan [Metoclopramide] Shortness Of Breath  . Lisinopril Cough  . Losartan Cough  . Sulfa Antibiotics Nausea And Vomiting  . Amoxicillin Rash    Has patient had a PCN reaction causing immediate rash, facial/tongue/throat swelling, SOB or lightheadedness with hypotension: Yes Has patient had a PCN reaction causing severe rash involving mucus membranes or skin necrosis: Broke out face & buttocks Has patient had a PCN reaction that required hospitalization: No Has patient had a PCN reaction occurring within the last 10 years: No If all of the above answers are "NO", then may proceed with Cephalosporin use  . Contrast Media [Iodinated Diagnostic Agents] Rash    Current Medications:   Current Outpatient Medications:  .  albuterol (PROVENTIL) (2.5 MG/3ML) 0.083% nebulizer solution, Take 3 mLs (2.5 mg total) by nebulization every 4 (four) hours as needed for wheezing or shortness of breath., Disp: 150 mL, Rfl: 1 .  amLODipine (NORVASC) 10 MG tablet, Take 1 tablet (10 mg total) by mouth daily., Disp: 90 tablet, Rfl: 2 .  Ganciclovir (ZIRGAN) 0.15 % GEL, Apply 1 drop to eye 5 (five) times daily. Right eye, Disp: , Rfl:  .  hydrALAZINE (APRESOLINE) 25 MG tablet, Take 1 tablet (25 mg total) by mouth 2 (two) times daily., Disp: 180 tablet, Rfl: 3 .  hydrochlorothiazide (HYDRODIURIL) 25 MG tablet, Take 1 tablet (25 mg total) by mouth daily., Disp: 90 tablet, Rfl: 1 .  Multiple Vitamins-Calcium (ONE-A-DAY WOMENS FORMULA PO), Take 1 tablet by mouth daily., Disp: , Rfl:  .  omeprazole (PRILOSEC) 20 MG capsule, Take 1 capsule (20 mg total) by mouth daily., Disp: 30 capsule, Rfl: 3 .  valACYclovir (VALTREX) 1000 MG tablet, Take 1,000 mg by mouth daily. , Disp: , Rfl:  .  venlafaxine XR (EFFEXOR-XR) 75 MG 24 hr capsule, Take 1 capsule (75 mg total) by mouth daily with breakfast. (Patient not taking: Reported on 02/26/2019), Disp: 90 capsule, Rfl: 4    Review of Systems:   ROS  Negative unless otherwise specified per HPI.  Vitals:   Vitals:   02/26/19 1519  BP: (!) 140/96  Pulse: 69  Temp: 98.4 F (36.9 C)  TempSrc: Temporal  SpO2: 97%  Height: 5\' 4"  (1.626 m)  Body mass index is 26.19 kg/m.  Physical Exam:   Physical Exam Constitutional:      Appearance: She is well-developed.  HENT:     Head: Normocephalic and atraumatic.  Eyes:     Conjunctiva/sclera: Conjunctivae normal.  Neck:     Musculoskeletal: Normal range of motion and neck supple.  Pulmonary:     Effort: Pulmonary effort is normal.  Musculoskeletal: Normal range of motion.     Comments: Very limited exam due to severity of pain.  Unable to extend the leg from wheelchair without significant pain.  Skin:    General: Skin is warm and dry.  Neurological:     Mental Status: She is alert and oriented to person, place, and time.  Psychiatric:        Behavior: Behavior normal.        Thought Content: Thought content normal.        Judgment: Judgment normal.       Assessment and Plan:   Avalia was seen today for thigh pain.  Diagnoses and all orders for this visit:  Right groin pain   Due to severity of pain, I do recommend that she see a specialist urgently.  She was agreeable to going to either [facility] or [facility] or urgent care this evening.  Recommended that she follow-up with [place] to let us know what happens with her course.  . Reviewed expectations re: course of current medical issues. . Discussed self-management of symptoms. . Outlined signs and symptoms indicating need for more acute intervention. . Patient verbalized understanding and all questions were answered. . See orders for this visit as documented in the electronic medical record. . Patient received an After-Visit Summary.  CMA or LPN served as scribe during this visit. History, Physical, and Plan performed by medical provider. The above documentation has been  reviewed and is accurate and complete.  Inda Coke, PA-C

## 2019-03-13 ENCOUNTER — Ambulatory Visit: Payer: BC Managed Care – PPO | Admitting: Cardiovascular Disease

## 2019-03-26 DIAGNOSIS — H52211 Irregular astigmatism, right eye: Secondary | ICD-10-CM | POA: Diagnosis not present

## 2019-03-26 DIAGNOSIS — H179 Unspecified corneal scar and opacity: Secondary | ICD-10-CM | POA: Diagnosis not present

## 2019-03-26 DIAGNOSIS — B0052 Herpesviral keratitis: Secondary | ICD-10-CM | POA: Diagnosis not present

## 2019-04-19 ENCOUNTER — Ambulatory Visit: Payer: BC Managed Care – PPO | Admitting: Family Medicine

## 2019-04-19 ENCOUNTER — Other Ambulatory Visit: Payer: Self-pay | Admitting: Family Medicine

## 2019-04-22 ENCOUNTER — Encounter: Payer: Self-pay | Admitting: Family Medicine

## 2019-04-23 ENCOUNTER — Inpatient Hospital Stay: Payer: Self-pay | Attending: Oncology

## 2019-04-26 ENCOUNTER — Encounter: Payer: Self-pay | Admitting: Emergency Medicine

## 2019-04-26 ENCOUNTER — Other Ambulatory Visit: Payer: Self-pay

## 2019-04-26 ENCOUNTER — Ambulatory Visit
Admission: EM | Admit: 2019-04-26 | Discharge: 2019-04-26 | Disposition: A | Discharge status: Home / Self Care | Payer: Self-pay | Attending: Emergency Medicine | Admitting: Emergency Medicine

## 2019-04-26 DIAGNOSIS — R1032 Left lower quadrant pain: Secondary | ICD-10-CM | POA: Insufficient documentation

## 2019-04-26 DIAGNOSIS — I10 Essential (primary) hypertension: Secondary | ICD-10-CM

## 2019-04-26 DIAGNOSIS — N898 Other specified noninflammatory disorders of vagina: Secondary | ICD-10-CM | POA: Insufficient documentation

## 2019-04-26 LAB — POCT URINALYSIS DIP (MANUAL ENTRY)
Bilirubin, UA: NEGATIVE
Blood, UA: NEGATIVE
Glucose, UA: NEGATIVE mg/dL
Ketones, POC UA: NEGATIVE mg/dL
Leukocytes, UA: NEGATIVE
Nitrite, UA: NEGATIVE
Protein Ur, POC: NEGATIVE mg/dL
Spec Grav, UA: 1.02 (ref 1.010–1.025)
Urobilinogen, UA: 0.2 E.U./dL
pH, UA: 7.5 (ref 5.0–8.0)

## 2019-04-26 MED ORDER — MELOXICAM 7.5 MG PO TABS: 7.5000 mg | ORAL_TABLET | Freq: Every day | ORAL | 0 refills | Status: DC

## 2019-04-26 NOTE — ED Triage Notes (Signed)
Pt presents to [facility] for assessment of white discharge and left lower abdominal pain x 1 week.  Patient states worsening over the last week.

## 2019-04-26 NOTE — Discharge Instructions (Addendum)
Mobic as needed for pain. Use hot compresses as well. Important follow-up with your gynecologist for possible ultrasound. In the interim, go to ER if you develop worsening pain, change in bowel habit, blood in stool or urine, fever.

## 2019-04-26 NOTE — ED Provider Notes (Signed)
[name]    CSN: [id number] Arrival date & time: 04/26/19  1118      History   Chief Complaint Chief Complaint  Patient presents with   APPT: 1130am   Abdominal Pain   Vaginal Discharge    HPI Suzanne [name] is a 43 y.o. female presenting for 1 week course of white, malodorous discharge, left lower abdominal pain.  States pain is near constant, no known exacerbating/alleviating factors.  Has not tried thing for her pain.  Patient does endorse history of chronic BV, for which she is followed by GYN.  Patient underwent pelvic ultrasound she is status post hysterectomy approximately 3 months ago without abnormal findings.  Has not had imaging since pain.  Patient denies history of torsion, ovarian cyst.  Patient does have intercourse with men, not routinely using condoms.  No pelvic pain.  Patient does admit to douching.  Denies constipation: States last bowel movement was this morning without blood, melena, pain.   Past Medical History:  Diagnosis Date   Abnormal Pap smear of cervix    Allergy    Anemia    Anxiety    Arthritis    Asthma    Asthma    Chest pain    Chronic lower back pain    Depression    Fibroid    GERD (gastroesophageal reflux disease)    Hypertension    Migraine    Migraines    Neck pain, chronic    STD (sexually transmitted disease)    HSV1, in eye    Patient Active Problem List   Diagnosis Date Noted   Iron deficiency anemia 10/01/2018   Thalassemia 10/01/2018   Galactorrhea 07/19/2018   Malnutrition of moderate degree ([place]) 07/19/2018   Abnormal TSH 07/19/2018   Nummular keratitis of right eye, 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/2018   Aortic atherosclerosis ([place]) 07/19/2018   Murmur, cardiac 03/26/2018   Former smoker, stopped 2019 03/26/2018   History of hysterectomy 11/25/2016   Unintentional weight loss 08/17/2016   B12 deficiency 08/12/2016   Daily nausea 08/06/2016   Microcytic anemia  08/06/2016   Chronic diarrhea 08/06/2016   Intestinal malabsorption 08/06/2016   Lung nodule < 6cm on CT 08/06/2016   Essential hypertension, on Norvasec, Hydralazine, and HCTZ; patient ACE/ARB intolerant 2/2 cough 07/02/2013   Atypical chest pain 07/02/2013   Shortness of breath 07/02/2013   Other and unspecified ovarian cyst 04/01/2013   Dysphagia 12/22/2011    Past Surgical History:  Procedure Laterality Date   COLPOSCOPY     ESOPHAGEAL DILATION     ESOPHAGEAL MANOMETRY N/A 04/01/2013   Procedure: ESOPHAGEAL MANOMETRY (EM);  Surgeon: Milus Banister, MD;  Location: [facility];  Service: Endoscopy;  Laterality: N/A;   ESOPHAGOGASTRODUODENOSCOPY (EGD) WITH ESOPHAGEAL DILATION N/A 02/28/2013   Procedure: ESOPHAGOGASTRODUODENOSCOPY (EGD) WITH ESOPHAGEAL DILATION;  Surgeon: Milus Banister, MD;  Location: [facility];  Service: Endoscopy;  Laterality: N/A;   TUBAL LIGATION     VAGINAL HYSTERECTOMY      OB History    Gravida  3   Para  2   Term      Preterm  2   AB  1   Living  1     SAB      TAB  1   Ectopic      Multiple      Live Births  2            Home Medications    Prior to Admission  medications   Medication Sig Start Date End Date Taking? Authorizing Provider  albuterol (PROVENTIL) (2.5 MG/3ML) 0.083% nebulizer solution Take 3 mLs (2.5 mg total) by nebulization every 4 (four) hours as needed for wheezing or shortness of breath. 12/11/17   Orma Flaming, MD  amLODipine (NORVASC) 10 MG tablet Take 1 tablet (10 mg total) by mouth daily. 12/04/17   Briscoe Deutscher, DO  Ganciclovir (ZIRGAN) 0.15 % GEL Apply 1 drop to eye 5 (five) times daily. Right eye 09/14/18   [provider]  hydrALAZINE (APRESOLINE) 25 MG tablet Take 1 tablet (25 mg total) by mouth 2 (two) times daily. 03/26/18   Erlene Quan, PA-C  hydrochlorothiazide (HYDRODIURIL) 25 MG tablet Take 1 tablet (25 mg total) by mouth daily. 12/11/17   Orma Flaming, MD  meloxicam  (MOBIC) 7.5 MG tablet Take 1 tablet (7.5 mg total) by mouth daily. 04/26/19   Hall-Potvin, [place], PA-C  Multiple Vitamins-Calcium (ONE-A-DAY WOMENS FORMULA PO) Take 1 tablet by mouth daily.    [provider]  omeprazole (PRILOSEC) 20 MG capsule Take 1 capsule (20 mg total) by mouth daily. 11/03/17   Briscoe Deutscher, DO  valACYclovir (VALTREX) 1000 MG tablet Take 1,000 mg by mouth daily.     [provider]  venlafaxine XR (EFFEXOR-XR) 75 MG 24 hr capsule Take 1 capsule (75 mg total) by mouth daily with breakfast. Patient not taking: Reported on 02/26/2019 01/21/19   Magrinat, Virgie Dad, MD    Family History Family History  Problem Relation Age of Onset   Heart disease Father        H/O CABG   Hypertension Mother    Hypertension Maternal Grandmother    Thyroid disease Maternal Grandmother    Diabetes Maternal Grandmother    Hypertension Paternal Grandmother    Diabetes Paternal Grandmother    Cancer - Lung Paternal Grandmother    Hypertension Maternal Aunt    Thyroid disease Maternal Aunt     Social History Social History   Tobacco Use   Smoking status: Former Smoker    Packs/day: 0.10    Years: 10.00    Pack years: 1.00    Types: Cigars    Quit date: 12/06/2017    Years since quitting: 1.3   Smokeless tobacco: Never Used   Tobacco comment: smokes 2 Black & Milds per day 08/19/13  Substance Use Topics   Alcohol use: Not Currently   Drug use: Not Currently     Allergies   Reglan [metoclopramide], Lisinopril, Losartan, Sulfa antibiotics, Amoxicillin, and Contrast media [iodinated diagnostic agents]   Review of Systems Review of Systems  Constitutional: Negative for fatigue and fever.  HENT: Negative for ear pain, sinus pain, sore throat and voice change.   Eyes: Negative for pain, redness and visual disturbance.  Respiratory: Negative for cough and shortness of breath.   Cardiovascular: Negative for chest pain and palpitations.    Gastrointestinal: Negative for abdominal pain, diarrhea and vomiting.  Genitourinary: Positive for vaginal discharge. Negative for dysuria, frequency, hematuria, urgency, vaginal bleeding and vaginal pain.  Musculoskeletal: Negative for arthralgias and myalgias.  Skin: Negative for rash and wound.  Neurological: Negative for syncope, weakness, numbness and headaches.     Physical Exam Triage Vital Signs ED Triage Vitals [04/26/19 1204]  Enc Vitals Group     BP (!) 172/104     Pulse Rate (!) 57     Resp 16     Temp (!) 97.4 F (36.3 C)     Temp  Source Oral     SpO2 98 %     Weight      Height      Head Circumference      Peak Flow      Pain Score      Pain Loc      Pain Edu?      Excl. in [place]?    No data found.  Updated Vital Signs BP (!) 172/104 (BP Location: Left Arm)    Pulse (!) 57    Temp (!) 97.4 F (36.3 C) (Oral)    Resp 16    SpO2 98%   Visual Acuity Right Eye Distance:   Left Eye Distance:   Bilateral Distance:    Right Eye Near:   Left Eye Near:    Bilateral Near:     Physical Exam Constitutional:      General: She is not in acute distress. HENT:     Head: Normocephalic and atraumatic.  Eyes:     General: No scleral icterus.    Pupils: Pupils are equal, round, and reactive to light.  Cardiovascular:     Rate and Rhythm: Normal rate.  Pulmonary:     Effort: Pulmonary effort is normal.  Abdominal:     General: Bowel sounds are normal.     Tenderness: There is abdominal tenderness in the left lower quadrant. There is no guarding or rebound. Negative signs include Murphy's sign, Rovsing's sign and McBurney's sign.  Genitourinary:    Vagina: No foreign body. Vaginal discharge present. No erythema, tenderness or bleeding.     Adnexa:        Left: Tenderness present. No mass or fullness.       Comments: Cervix and uterus are surgically absent.  Copious white discharge without thickened, curdish appearance Skin:    Coloration: Skin is not jaundiced  or pale.  Neurological:     Mental Status: She is alert and oriented to person, place, and time.      UC Treatments / Results  Labs (all labs ordered are listed, but only abnormal results are displayed) Labs Reviewed  POCT URINALYSIS DIP (MANUAL ENTRY)  CERVICOVAGINAL ANCILLARY ONLY    EKG   Radiology No results found.  Procedures Procedures (including critical care time)  Medications Ordered in UC Medications - No data to display  Initial Impression / Assessment and Plan / UC Course  I have reviewed the triage vital signs and the nursing notes.  Pertinent labs & imaging results that were available during my care of the patient were reviewed by me and considered in my medical decision making (see chart for details).     Patient afebrile, nontoxic.  POCT urine dipstick done in office, reviewed by me: Negative.  Cervical vaginal swab obtained by provider during pelvic exam (which was unremarkable/low concern for PID at this time): We will await results prior to initiating treatment.  Without signs/symptoms of acute abdomen: We will trial meloxicam for left lower quadrant pain.  ER return precautions discussed, patient verbalized understanding and is agreeable to plan. Final Clinical Impressions(s) / UC Diagnoses   Final diagnoses:  Vaginal discharge  LLQ pain     Discharge Instructions     Mobic as needed for pain. Use hot compresses as well. Important follow-up with your gynecologist for possible ultrasound. In the interim, go to ER if you develop worsening pain, change in bowel habit, blood in stool or urine, fever.    ED Prescriptions    Medication Sig  Dispense Auth. Provider   meloxicam (MOBIC) 7.5 MG tablet Take 1 tablet (7.5 mg total) by mouth daily. 14 tablet Hall-Potvin, [place], PA-C     PDMP not reviewed this encounter.   Neldon Mc [place], [place] 04/26/19 1941

## 2019-04-30 LAB — CERVICOVAGINAL ANCILLARY ONLY
Bacterial vaginitis: POSITIVE — AB
Candida vaginitis: POSITIVE — AB
Chlamydia: NEGATIVE
Neisseria Gonorrhea: NEGATIVE
Trichomonas: NEGATIVE

## 2019-05-01 ENCOUNTER — Telehealth: Payer: Self-pay | Admitting: Emergency Medicine

## 2019-05-01 MED ORDER — FLUCONAZOLE 150 MG PO TABS: 150.0000 mg | ORAL_TABLET | Freq: Once | ORAL | 0 refills | Status: AC

## 2019-05-01 MED ORDER — METRONIDAZOLE 500 MG PO TABS: 500.0000 mg | ORAL_TABLET | Freq: Two times a day (BID) | ORAL | 0 refills | Status: AC

## 2019-05-01 NOTE — Telephone Encounter (Signed)
Bacterial vaginosis is positive. This was not treated at the urgent care visit.  Flagyl 500 mg BID x 7 days #14 no refills sent to patients pharmacy of choice.    Test for candida (yeast) was positive.  Prescription for fluconazole 150mg  po now, repeat dose in 3d if needed, #2 no refills, sent to the pharmacy of record.  Recheck or followup with PCP for further evaluation if symptoms are not improving.    Attempted to reach patient. No answer at this time. Voicemail left.

## 2019-05-01 NOTE — Telephone Encounter (Signed)
Patient contacted by phone and made aware of  bv and yeast  results. Pt verbalized understanding and had all questions answered.

## 2019-07-22 IMAGING — NM NM PULMONARY VENT & PERF
16 series · 16 of 16 positions shown · non-contrast
Comparison: 12/11/2017 chest radiograph.

CLINICAL DATA: 42 y/o F; shortness of breath and chest pain. Rule
out PE.

EXAM:
NUCLEAR MEDICINE VENTILATION - PERFUSION LUNG SCAN
TECHNIQUE: Ventilation images were obtained in multiple projections using
inhaled aerosol Zc-VVm DTPA. Perfusion images were obtained in
multiple projections after intravenous injection of [id number].
RADIOPHARMACEUTICALS:  32 mCi of Zc-VVm DTPA aerosol inhalation and
4.4 mCi OcLLm-SWW IV

[Series 1: ant/post vent · 4.14mm/px · 1 of 1 slices shown (1 of 2)]
[im 1/1]
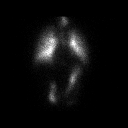

[Series 1: ant/post vent · 4.14mm/px · 1 of 1 slices shown (2 of 2)]
[im 1/1]
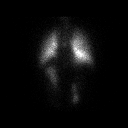

[Series 2: lao/rpo vent · 4.14mm/px · 1 of 1 slices shown (1 of 2)]
[im 1/1]
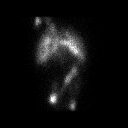

[Series 2: lao/rpo vent · 4.14mm/px · 1 of 1 slices shown (2 of 2)]
[im 1/1]
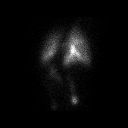

[Series 3: lpo/rao vent · 4.14mm/px · 1 of 1 slices shown (1 of 2)]
[im 1/1]
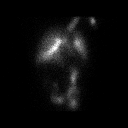

[Series 3: lpo/rao vent · 4.14mm/px · 1 of 1 slices shown (2 of 2)]
[im 1/1]
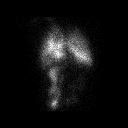

[Series 4: lt lat/rt lat vent · 4.14mm/px · 1 of 1 slices shown (1 of 2)]
[im 1/1]
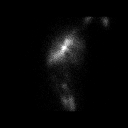

[Series 4: lt lat/rt lat vent · 4.14mm/px · 1 of 1 slices shown (2 of 2)]
[im 1/1]
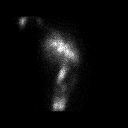

[Series 5: lt lat/rt lat perf · 4.14mm/px · 1 of 1 slices shown (1 of 2)]
[im 1/1]
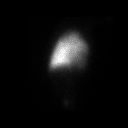

[Series 5: lt lat/rt lat perf · 4.14mm/px · 1 of 1 slices shown (2 of 2)]
[im 1/1]
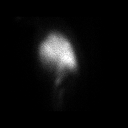

[Series 6: lpo/rao perf · 4.14mm/px · 1 of 1 slices shown (1 of 2)]
[im 1/1]
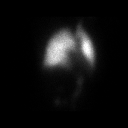

[Series 6: lpo/rao perf · 4.14mm/px · 1 of 1 slices shown (2 of 2)]
[im 1/1]
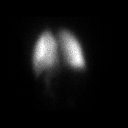

[Series 7: ant/post perf · 4.14mm/px · 1 of 1 slices shown (1 of 2)]
[im 1/1]
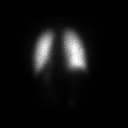

[Series 7: ant/post perf · 4.14mm/px · 1 of 1 slices shown (2 of 2)]
[im 1/1]
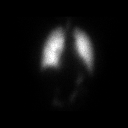

[Series 8: lao/rpo perf · 4.14mm/px · 1 of 1 slices shown (1 of 2)]
[im 1/1]
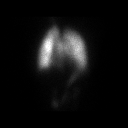

[Series 8: lao/rpo perf · 4.14mm/px · 1 of 1 slices shown (2 of 2)]
[im 1/1]
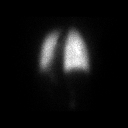

[16 of 16 positions shown; findings below may reference images not displayed]

FINDINGS: Ventilation: No focal ventilation defect.

Perfusion: No wedge shaped peripheral perfusion defects to suggest
acute pulmonary embolism.
IMPRESSION: Normal ventilation perfusion scan. No evidence of pulmonary embolus.

By: Jimmi Vidana M.D.

## 2019-07-24 ENCOUNTER — Inpatient Hospital Stay: Payer: 59 | Attending: Family Medicine

## 2019-07-24 ENCOUNTER — Inpatient Hospital Stay: Payer: 59 | Admitting: Oncology

## 2019-07-26 ENCOUNTER — Ambulatory Visit: Payer: Self-pay | Admitting: Cardiovascular Disease

## 2019-07-28 ENCOUNTER — Ambulatory Visit
Admission: EM | Admit: 2019-07-28 | Discharge: 2019-07-28 | Disposition: A | Discharge status: Home / Self Care | Payer: 59 | Attending: Emergency Medicine | Admitting: Emergency Medicine

## 2019-07-28 ENCOUNTER — Telehealth: Payer: Self-pay | Admitting: Emergency Medicine

## 2019-07-28 ENCOUNTER — Encounter: Payer: Self-pay | Admitting: Emergency Medicine

## 2019-07-28 ENCOUNTER — Other Ambulatory Visit: Payer: Self-pay

## 2019-07-28 DIAGNOSIS — R102 Pelvic and perineal pain: Secondary | ICD-10-CM

## 2019-07-28 DIAGNOSIS — R1909 Other intra-abdominal and pelvic swelling, mass and lump: Secondary | ICD-10-CM

## 2019-07-28 DIAGNOSIS — N9489 Other specified conditions associated with female genital organs and menstrual cycle: Secondary | ICD-10-CM | POA: Insufficient documentation

## 2019-07-28 DIAGNOSIS — I10 Essential (primary) hypertension: Secondary | ICD-10-CM

## 2019-07-28 MED ORDER — FLUCONAZOLE 200 MG PO TABS: 200.0000 mg | ORAL_TABLET | Freq: Once | ORAL | 0 refills | Status: AC

## 2019-07-28 NOTE — ED Provider Notes (Signed)
[name]    CSN: [id number] Arrival date & time: 07/28/19  1039      History   Chief Complaint Chief Complaint  Patient presents with  . Abdominal Pain  . Vaginal Discharge    HPI Suzanne [name] is a 44 y.o. female presenting for lower abdominal pain, thick vaginal discharge for the last 2 weeks.  Patient resting history of BV and initially thought this was another flareup.  Patient has not been sexually for the last 4 months and is status post hysterectomy: Still has her ovaries bilaterally.  Patient states abdominal pain is left groin, sometimes radiates down her leg.  Denies history of hernia.  States is worse with standing, walking.  Denies change in bowel or bladder habit.  States discharge is milky, white, smells like fish.  Has not taken anything for symptoms.    Past Medical History:  Diagnosis Date  . Abnormal Pap smear of cervix   . Allergy   . Anemia   . Anxiety   . Arthritis   . Asthma   . Asthma   . Chest pain   . Chronic lower back pain   . Depression   . Fibroid   . GERD (gastroesophageal reflux disease)   . Hypertension   . Migraine   . Migraines   . Neck pain, chronic   . STD (sexually transmitted disease)    HSV1, in eye    Patient Active Problem List   Diagnosis Date Noted  . Iron deficiency anemia 10/01/2018  . Thalassemia 10/01/2018  . Galactorrhea 07/19/2018  . Malnutrition of moderate degree ([place]) 07/19/2018  . Abnormal TSH 07/19/2018  . Nummular keratitis of right eye, followed by [facility] 07/19/2018  . Aortic atherosclerosis ([place]) 07/19/2018  . Murmur, cardiac 03/26/2018  . Former smoker, stopped 2019 03/26/2018  . History of hysterectomy 11/25/2016  . Unintentional weight loss 08/17/2016  . B12 deficiency 08/12/2016  . Daily nausea 08/06/2016  . Microcytic anemia 08/06/2016  . Chronic diarrhea 08/06/2016  . Intestinal malabsorption 08/06/2016  . Lung nodule < 6cm on CT 08/06/2016  . Essential hypertension, on  Norvasec, Hydralazine, and HCTZ; patient ACE/ARB intolerant 2/2 cough 07/02/2013  . Atypical chest pain 07/02/2013  . Shortness of breath 07/02/2013  . Other and unspecified ovarian cyst 04/01/2013  . Dysphagia 12/22/2011    Past Surgical History:  Procedure Laterality Date  . COLPOSCOPY    . ESOPHAGEAL DILATION    . ESOPHAGEAL MANOMETRY N/A 04/01/2013   Procedure: ESOPHAGEAL MANOMETRY (EM);  Surgeon: Milus Banister, MD;  Location: [facility];  Service: Endoscopy;  Laterality: N/A;  . ESOPHAGOGASTRODUODENOSCOPY (EGD) WITH ESOPHAGEAL DILATION N/A 02/28/2013   Procedure: ESOPHAGOGASTRODUODENOSCOPY (EGD) WITH ESOPHAGEAL DILATION;  Surgeon: Milus Banister, MD;  Location: [facility];  Service: Endoscopy;  Laterality: N/A;  . TUBAL LIGATION    . VAGINAL HYSTERECTOMY      OB History    Gravida  3   Para  2   Term      Preterm  2   AB  1   Living  1     SAB      TAB  1   Ectopic      Multiple      Live Births  2            Home Medications    Prior to Admission medications   Medication Sig Start Date End Date Taking? Authorizing Provider  albuterol (PROVENTIL) (2.5 MG/3ML) 0.083% nebulizer solution  Take 3 mLs (2.5 mg total) by nebulization every 4 (four) hours as needed for wheezing or shortness of breath. 12/11/17   Orma Flaming, MD  amLODipine (NORVASC) 10 MG tablet Take 1 tablet (10 mg total) by mouth daily. 12/04/17   Briscoe Deutscher, DO  fluconazole (DIFLUCAN) 200 MG tablet Take 1 tablet (200 mg total) by mouth once for 1 dose. May repeat in 72 hours if needed 07/28/19 07/28/19  Hall-Potvin, [place], PA-C  Ganciclovir (ZIRGAN) 0.15 % GEL Apply 1 drop to eye 5 (five) times daily. Right eye 09/14/18   [provider]  hydrALAZINE (APRESOLINE) 25 MG tablet Take 1 tablet (25 mg total) by mouth 2 (two) times daily. 03/26/18   Erlene Quan, PA-C  hydrochlorothiazide (HYDRODIURIL) 25 MG tablet Take 1 tablet (25 mg total) by mouth daily. 12/11/17   Orma Flaming, MD  meloxicam (MOBIC) 7.5 MG tablet Take 1 tablet (7.5 mg total) by mouth daily. 04/26/19   Hall-Potvin, [place], PA-C  Multiple Vitamins-Calcium (ONE-A-DAY WOMENS FORMULA PO) Take 1 tablet by mouth daily.    [provider]  omeprazole (PRILOSEC) 20 MG capsule Take 1 capsule (20 mg total) by mouth daily. 11/03/17   Briscoe Deutscher, DO  valACYclovir (VALTREX) 1000 MG tablet Take 1,000 mg by mouth daily.     [provider]  venlafaxine XR (EFFEXOR-XR) 75 MG 24 hr capsule Take 1 capsule (75 mg total) by mouth daily with breakfast. Patient not taking: Reported on 02/26/2019 01/21/19   Magrinat, Virgie Dad, MD    Family History Family History  Problem Relation Age of Onset  . Heart disease Father        H/O CABG  . Hypertension Mother   . Hypertension Maternal Grandmother   . Thyroid disease Maternal Grandmother   . Diabetes Maternal Grandmother   . Hypertension Paternal Grandmother   . Diabetes Paternal Grandmother   . Cancer - Lung Paternal Grandmother   . Hypertension Maternal Aunt   . Thyroid disease Maternal Aunt     Social History Social History   Tobacco Use  . Smoking status: Former Smoker    Packs/day: 0.10    Years: 10.00    Pack years: 1.00    Types: Cigars    Quit date: 12/06/2017    Years since quitting: 1.6  . Smokeless tobacco: Never Used  . Tobacco comment: smokes 2 Black & Milds per day 08/19/13  Substance Use Topics  . Alcohol use: Not Currently  . Drug use: Not Currently     Allergies   Reglan [metoclopramide], Lisinopril, Losartan, Sulfa antibiotics, Amoxicillin, and Contrast media [iodinated diagnostic agents]   Review of Systems As per HPI   Physical Exam Triage Vital Signs ED Triage Vitals  Enc Vitals Group     BP      Pulse      Resp      Temp      Temp src      SpO2      Weight      Height      Head Circumference      Peak Flow      Pain Score      Pain Loc      Pain Edu?      Excl. in [place]?    No data  found.  Updated Vital Signs BP (!) 170/104   Pulse 81   Temp 97.6 F (36.4 C) (Temporal)   Resp 18   SpO2 95%   Visual  Acuity Right Eye Distance:   Left Eye Distance:   Bilateral Distance:    Right Eye Near:   Left Eye Near:    Bilateral Near:     Physical Exam Constitutional:      General: She is not in acute distress. HENT:     Head: Normocephalic and atraumatic.  Eyes:     General: No scleral icterus.    Pupils: Pupils are equal, round, and reactive to light.  Cardiovascular:     Rate and Rhythm: Normal rate.  Pulmonary:     Effort: Pulmonary effort is normal.  Abdominal:     General: Bowel sounds are normal.     Palpations: Abdomen is soft.     Tenderness: There is no abdominal tenderness. There is no right CVA tenderness, left CVA tenderness or guarding. Negative signs include Murphy's sign, Rovsing's sign and McBurney's sign.     Comments: Left inguinal pulsatile mass appreciated that is TTP.  Does not increase in size with Valsalva.   Genitourinary:    Vagina: No foreign body. Vaginal discharge present. No erythema, tenderness or bleeding.     Adnexa:        Right: No mass, tenderness or fullness.         Left: Tenderness present. No mass or fullness.       Comments: Cervix and uterus are surgically absent Skin:    General: Skin is warm.     Coloration: Skin is not cyanotic, jaundiced, mottled or pale.     Findings: No erythema or rash.  Neurological:     Mental Status: She is alert and oriented to person, place, and time.      UC Treatments / Results  Labs (all labs ordered are listed, but only abnormal results are displayed) Labs Reviewed  CERVICOVAGINAL ANCILLARY ONLY    EKG   Radiology No results found.  Procedures Procedures (including critical care time)  Medications Ordered in UC Medications - No data to display  Initial Impression / Assessment and Plan / UC Course  I have reviewed the triage vital signs and the nursing notes.   Pertinent labs & imaging results that were available during my care of the patient were reviewed by me and considered in my medical decision making (see chart for details).     Patient afebrile, nontoxic in office today.  Patient is hypertensive, though without cardiopulmonary symptoms, headache, or change in vision.  Has had similar readings in the past.  Cervical vaginal swab pending vaginal discharge concerning for Candida: We will treat as outlined below.  Most concerning is possible pulsatile mass that is tender: LAD versus hernia, though cannot rule out ovarian torsion or cyst.  Recommend patient go to ER for further evaluation/management.  Patient verbalized understanding, electing to go to ER by self transport. Final Clinical Impressions(s) / UC Diagnoses   Final diagnoses:  Pelvic pain  Pain of ovary  Pulsatile groin mass     Discharge Instructions     Recommend you go to ER for further evaluation of pain.    ED Prescriptions    Medication Sig Dispense Auth. Provider   fluconazole (DIFLUCAN) 200 MG tablet Take 1 tablet (200 mg total) by mouth once for 1 dose. May repeat in 72 hours if needed 2 tablet Hall-Potvin, [place], PA-C     PDMP not reviewed this encounter.   Hall-Potvin, [place], [place] 07/28/19 2259

## 2019-07-28 NOTE — ED Triage Notes (Signed)
Pt presents to [facility] for assessment of uCC for assessment of thick vaginal discharge and lower abdominal pain x 2 weeks.  States hx of BV, this seems the same, but more painful.  Pt also c/o 1 week of shoulder and neck pain.

## 2019-07-28 NOTE — Discharge Instructions (Signed)
Recommend you go to ER for further evaluation of pain.

## 2019-07-28 NOTE — Telephone Encounter (Signed)
Called patient to follow up on visit to ER recommended by provider at [facility].  Patient states she called and confirmed she would need to pay for another visit, and is currently trying to work with her PCP to have an outpatient ultrasound ordered for tomorrow.  This RN reviewed ER precautions, explained rationale for going to ER today, and patient verbalized understanding.  Patient states she will continue to wait on PCP, but if PCP cannot get her in tomorrow, will go to ER.

## 2019-07-31 ENCOUNTER — Ambulatory Visit (INDEPENDENT_AMBULATORY_CARE_PROVIDER_SITE_OTHER): Payer: 59 | Admitting: Physician Assistant

## 2019-07-31 ENCOUNTER — Encounter: Payer: Self-pay | Admitting: Physician Assistant

## 2019-07-31 VITALS — Ht 64.0 in | Wt 162.0 lb

## 2019-07-31 DIAGNOSIS — M542 Cervicalgia: Secondary | ICD-10-CM | POA: Diagnosis not present

## 2019-07-31 DIAGNOSIS — R1031 Right lower quadrant pain: Secondary | ICD-10-CM | POA: Diagnosis not present

## 2019-07-31 DIAGNOSIS — B9689 Other specified bacterial agents as the cause of diseases classified elsewhere: Secondary | ICD-10-CM

## 2019-07-31 DIAGNOSIS — N760 Acute vaginitis: Secondary | ICD-10-CM | POA: Diagnosis not present

## 2019-07-31 DIAGNOSIS — I10 Essential (primary) hypertension: Secondary | ICD-10-CM | POA: Diagnosis not present

## 2019-07-31 LAB — CERVICOVAGINAL ANCILLARY ONLY
Bacterial vaginitis: POSITIVE — AB
Chlamydia: NEGATIVE
Neisseria Gonorrhea: NEGATIVE
Trichomonas: NEGATIVE

## 2019-07-31 MED ORDER — ALBUTEROL SULFATE (2.5 MG/3ML) 0.083% IN NEBU: 2.5000 mg | INHALATION_SOLUTION | RESPIRATORY_TRACT | 1 refills | Status: DC | PRN

## 2019-07-31 MED ORDER — METRONIDAZOLE 0.75 % VA GEL: 1.0000 | VAGINAL | 0 refills | Status: AC

## 2019-07-31 MED ORDER — METRONIDAZOLE 500 MG PO TABS: 500.0000 mg | ORAL_TABLET | Freq: Two times a day (BID) | ORAL | 0 refills | Status: AC

## 2019-07-31 MED ORDER — AMLODIPINE BESYLATE 10 MG PO TABS: 10.0000 mg | ORAL_TABLET | Freq: Every day | ORAL | 0 refills | Status: DC

## 2019-07-31 MED ORDER — HYDRALAZINE HCL 25 MG PO TABS: 25.0000 mg | ORAL_TABLET | Freq: Two times a day (BID) | ORAL | 0 refills | Status: DC

## 2019-07-31 MED ORDER — HYDROCHLOROTHIAZIDE 25 MG PO TABS: 25.0000 mg | ORAL_TABLET | Freq: Every day | ORAL | 0 refills | Status: DC

## 2019-07-31 NOTE — Progress Notes (Signed)
Virtual Visit via Video   I connected with Suzanne [name] on 07/31/19 at  4:00 PM EST by a video enabled telemedicine application and verified that I am speaking with the correct person using two identifiers. Location patient: Home Location provider: [facility], Office Persons participating in the virtual visit: Suzanne, [place] PA-C, Anselmo Pickler, LPN   I discussed the limitations of evaluation and management by telemedicine and the availability of in person appointments. The patient expressed understanding and agreed to proceed.  I acted as a Education administrator for [employer], [employer], LPN  Subjective:   HPI:   Vaginal discharge Pt c/o vaginal discharge (milky, white, fishy) with odor for the past 2.5 weeks. Pt went to UC on 02/28 and cultures were done, came back positive for BV. Pt was not treated. She has history of chronic BV and was last treated about 3-4 months ago.  Groin pain and Neck pain Left groin pain radiating down left leg with numbness and tingling. Went to urgent care on 2/28 also for this and was found to have a "pulsatile tender mass." She was recommended to go to the ER but she didn't. Patient reports that she is also having neck pain.  HTN Currently taking Norvasc 10 mg, Hydralazine 25 mg BID and HCTZ 25 mg. At home blood pressure readings are: not checked. Patient denies chest pain, SOB, blurred vision, dizziness, unusual headaches, lower leg swelling. Denies excessive caffeine intake, stimulant usage, excessive alcohol intake, or increase in salt consumption. She has a history of seeing cardiology for uncontrolled BP.  BP Readings from Last 3 Encounters:  07/28/19 (!) 170/104  04/26/19 (!) 172/104  02/26/19 (!) 140/96      ROS: See pertinent positives and negatives per HPI.  Patient Active Problem List   Diagnosis Date Noted  . Iron deficiency anemia 10/01/2018  . Thalassemia 10/01/2018  . Galactorrhea 07/19/2018  .  Malnutrition of moderate degree ([place]) 07/19/2018  . Abnormal TSH 07/19/2018  . Nummular keratitis of right eye, [facility] by [facility] 07/19/2018  . Aortic atherosclerosis ([place]) 07/19/2018  . Murmur, cardiac 03/26/2018  . Former smoker, stopped 2019 03/26/2018  . History of hysterectomy 11/25/2016  . Unintentional weight loss 08/17/2016  . B12 deficiency 08/12/2016  . Daily nausea 08/06/2016  . Microcytic anemia 08/06/2016  . Chronic diarrhea 08/06/2016  . Intestinal malabsorption 08/06/2016  . Lung nodule < 6cm on CT 08/06/2016  . Essential hypertension, on Norvasec, Hydralazine, and HCTZ; patient ACE/ARB intolerant 2/2 cough 07/02/2013  . Atypical chest pain 07/02/2013  . Shortness of breath 07/02/2013  . Other and unspecified ovarian cyst 04/01/2013  . Dysphagia 12/22/2011    Social History   Tobacco Use  . Smoking status: Former Smoker    Packs/day: 0.10    Years: 10.00    Pack years: 1.00    Types: Cigars    Quit date: 12/06/2017    Years since quitting: 1.6  . Smokeless tobacco: Never Used  . Tobacco comment: smokes 2 Black & Milds per day 08/19/13  Substance Use Topics  . Alcohol use: Not Currently    Current Outpatient Medications:  .  amLODipine (NORVASC) 10 MG tablet, Take 1 tablet (10 mg total) by mouth daily., Disp: 30 tablet, Rfl: 0 .  hydrALAZINE (APRESOLINE) 25 MG tablet, Take 1 tablet (25 mg total) by mouth 2 (two) times daily., Disp: 60 tablet, Rfl: 0 .  hydrochlorothiazide (HYDRODIURIL) 25 MG tablet, Take 1 tablet (25 mg total) by  mouth daily., Disp: 30 tablet, Rfl: 0 .  Multiple Vitamins-Calcium (ONE-A-DAY WOMENS FORMULA PO), Take 1 tablet by mouth daily., Disp: , Rfl:  .  omeprazole (PRILOSEC) 20 MG capsule, Take 1 capsule (20 mg total) by mouth daily., Disp: 30 capsule, Rfl: 3 .  valACYclovir (VALTREX) 1000 MG tablet, Take 1,000 mg by mouth daily. , Disp: , Rfl:  .  albuterol (PROVENTIL) (2.5 MG/3ML) 0.083% nebulizer solution, Take 3 mLs (2.5 mg total) by  nebulization every 4 (four) hours as needed for wheezing or shortness of breath., Disp: 150 mL, Rfl: 1 .  metroNIDAZOLE (FLAGYL) 500 MG tablet, Take 1 tablet (500 mg total) by mouth 2 (two) times daily for 7 days., Disp: 14 tablet, Rfl: 0 .  [START ON 08/01/2019] metroNIDAZOLE (METROGEL) 0.75 % vaginal gel, Place 1 Applicatorful vaginally 2 (two) times a week for 16 doses., Disp: 160 g, Rfl: 0  Allergies  Allergen Reactions  . Reglan [Metoclopramide] Shortness Of Breath  . Lisinopril Cough  . Losartan Cough  . Sulfa Antibiotics Nausea And Vomiting  . Amoxicillin Rash    Has patient had a PCN reaction causing immediate rash, facial/tongue/throat swelling, SOB or lightheadedness with hypotension: Yes Has patient had a PCN reaction causing severe rash involving mucus membranes or skin necrosis: Broke out face & buttocks Has patient had a PCN reaction that required hospitalization: No Has patient had a PCN reaction occurring within the last 10 years: No If all of the above answers are "NO", then may proceed with Cephalosporin use  . Contrast Media [Iodinated Diagnostic Agents] Rash    Objective:   VITALS: Per patient if applicable, see vitals. GENERAL: Alert, appears well and in no acute distress. HEENT: Atraumatic, conjunctiva clear, no obvious abnormalities on inspection of external nose and ears. NECK: Normal movements of the head and neck. CARDIOPULMONARY: No increased WOB. Speaking in clear sentences. I:E ratio WNL.  MS: Moves all visible extremities without noticeable abnormality. PSYCH: Pleasant and cooperative, well-groomed. Speech normal rate and rhythm. Affect is appropriate. Insight and judgement are appropriate. Attention is focused, linear, and appropriate.  NEURO: CN grossly intact. Oriented as arrived to appointment on time with no prompting. Moves both UE equally.  SKIN: No obvious lesions, wounds, erythema, or cyanosis noted on face or hands.  Assessment and Plan:    Suzanne [name] was seen today for vaginal discharge with odor.  Diagnoses and all orders for this visit:  Essential hypertension Uncontrolled per UC note, will refill current scripts for 30 days but will not refill until patient can be either seen in office with [place] or back at the HTN clinic. She is currently asymptomatic. Orders: -     amLODipine (NORVASC) 10 MG tablet; Take 1 tablet (10 mg total) by mouth daily.  Neck pain; Right groin pain Unclear etiology. She is agreeable to sports medicine referral. If worsening symptoms in the meantime, needs to go to the ER. -     Ambulatory referral to Sports Medicine  BV (bacterial vaginosis) Untreated from UC. Will start oral flagyl 500 mg BID x 7 days and then afterwards do maintenance therapy of metrogel 2 times a week x 8 weeks. Follow-up if further concerns, may need to return to [facility] if this suppressive treatment is unsuccessful.  Other orders -     metroNIDAZOLE (FLAGYL) 500 MG tablet; Take 1 tablet (500 mg total) by mouth 2 (two) times daily for 7 days. -     albuterol (PROVENTIL) (2.5 MG/3ML) 0.083% nebulizer solution; Take  3 mLs (2.5 mg total) by nebulization every 4 (four) hours as needed for wheezing or shortness of breath. -     hydrALAZINE (APRESOLINE) 25 MG tablet; Take 1 tablet (25 mg total) by mouth 2 (two) times daily. -     hydrochlorothiazide (HYDRODIURIL) 25 MG tablet; Take 1 tablet (25 mg total) by mouth daily. -     metroNIDAZOLE (METROGEL) 0.75 % vaginal gel; Place 1 Applicatorful vaginally 2 (two) times a week for 16 doses.  . Reviewed expectations re: course of current medical issues. . Discussed self-management of symptoms. . Outlined signs and symptoms indicating need for more acute intervention. . Patient verbalized understanding and all questions were answered. Marland Kitchen Health Maintenance issues including appropriate healthy diet, exercise, and smoking avoidance were discussed with patient. . See orders for this visit  as documented in the electronic medical record.  I discussed the assessment and treatment plan with the patient. The patient was provided an opportunity to ask questions and all were answered. The patient agreed with the plan and demonstrated an understanding of the instructions.   The patient was advised to call back or seek an in-person evaluation if the symptoms worsen or if the condition fails to improve as anticipated.   CMA or LPN served as scribe during this visit. History, Physical, and Plan performed by medical provider. The above documentation has been reviewed and is accurate and complete.  [place], [place] 07/31/2019

## 2019-08-05 ENCOUNTER — Ambulatory Visit (INDEPENDENT_AMBULATORY_CARE_PROVIDER_SITE_OTHER): Payer: 59

## 2019-08-05 ENCOUNTER — Encounter: Payer: Self-pay | Admitting: Family Medicine

## 2019-08-05 ENCOUNTER — Other Ambulatory Visit: Payer: Self-pay

## 2019-08-05 ENCOUNTER — Ambulatory Visit (INDEPENDENT_AMBULATORY_CARE_PROVIDER_SITE_OTHER): Payer: 59 | Admitting: Family Medicine

## 2019-08-05 ENCOUNTER — Ambulatory Visit: Payer: Self-pay

## 2019-08-05 VITALS — BP 156/92 | HR 88 | Ht 64.0 in | Wt 169.2 lb

## 2019-08-05 DIAGNOSIS — M25552 Pain in left hip: Secondary | ICD-10-CM

## 2019-08-05 DIAGNOSIS — R1032 Left lower quadrant pain: Secondary | ICD-10-CM

## 2019-08-05 MED ORDER — PREGABALIN 75 MG PO CAPS: 75.0000 mg | ORAL_CAPSULE | Freq: Two times a day (BID) | ORAL | 3 refills | Status: DC

## 2019-08-05 NOTE — Progress Notes (Signed)
Subjective:    I'm seeing this patient as a consultation for:  Len Blalock, [place]. Note will be routed back to referring provider/PCP.  CC: L groin pain and neck pain  I, Molly Weber, LAT, ATC, am serving as scribe for Dr. Lynne Leader.  HPI: Pt is a 44 y/o female presenting w/ L groin pain and neck pain.    L groin pain: Pt was seen at the [facility] for abdominal pain, L groin pain and vaginal discharge on 07/28/19 and prescribed Fluconazole.  She f/u w/ her PCP on 07/31/19 and con't to c/o L groin pain that runs into her L LE.  She also reports numbness/tingling into her L LE.  Since her visit w/ her PCP on 07/31/19, pt reports that she is having pain in her lower back that radiates into L LE along her posterior thigh and then transitions into her R ant lower leg and to her L great toe.  She is having numbness in her B great toes.  She also notes pain in her L groin and pulsating sensation in her L groin.  She is having weakness in her B lower legs and feet.  Neck: She notes R-sided neck and scapular pain x 2 weeks that is aggravated w/ cervical flexion and R rotation.  She states that this pain is worse at night and states that she either sleeps w/ a sling on or w/ her bra on for some support.  She reports burning pain from her R shoulder to her R elbow.  Past medical history, Surgical history, Family history, Social history, Allergies, and medications have been entered into the medical record, reviewed.   Review of Systems: No new headache, visual changes, nausea, vomiting, diarrhea, constipation, dizziness, abdominal pain, skin rash, fevers, chills, night sweats, weight loss, swollen lymph nodes, body aches, joint swelling, muscle aches, chest pain, shortness of breath, mood changes, visual or auditory hallucinations.   Objective:    Vitals:   08/05/19 1543  BP: (!) 156/92  Pulse: 88  SpO2: 96%   General: Well Developed, well nourished, and in no acute distress.  Neuro/Psych: Alert and  oriented x3, extra-ocular muscles intact, able to move all 4 extremities, sensation grossly intact. Skin: Warm and dry, no rashes noted.  Respiratory: Not using accessory muscles, speaking in full sentences, trachea midline.  Cardiovascular: Pulses palpable, no extremity edema. Abdomen: Does not appear distended. MSK:  C-spine: Nontender to spinal midline.  Tender palpation right cervical paraspinal musculature. Normal cervical motion. Upper extremity strength is intact throughout. Reflexes and sensation equal normal bilateral extremities. Right shoulder normal-appearing normal motion nontender.  Normal strength.  L-spine: Nontender to spinal midline.  Decreased lumbar motion. Lower extremity strength reflexes and sensation are equal normal throughout bilateral extremities.  Left hip: Normal-appearing Tender palpation anterior hip and groin.  Palpation of painful pulsatile area right anterior hip/groin reproduces medial thigh pain. Hip motion decreased flexion and internal rotation. Strength is intact.    Lab and Radiology Results  X-ray images pelvis revealed normal-appearing bony structures no acute changes. X-rays personally independently reviewed. Await formal radiology review  Diagnostic Limited MSK Ultrasound of: Left anterior hip and groin Left anterior hip visualized.  No significant hip effusion.  Anterior labrum appears slightly degenerative.  Normal-appearing bony structures. Femoral artery and vein and nerve visualized.  Vascular structure is enlarged about double the size compared to the contralateral right side.  Flow is seen within both artery and vein. Femoral nerve visualized.  Palpation in  this structure with ultrasound probe reproduces thigh pain. Impression: Possible femoral artery aneurysm   Impression and Recommendations:    Assessment and Plan: 44 y.o. female with  Left leg and groin pain.  Femoral artery or vein is enlarged on ultrasound.  I suspect she  has a femoral artery aneurysm that is compressing the femoral vein causing some of her leg pain.  Plan for duplex arterial ultrasound to further characterize this.  Plan to recheck back in about 2 weeks.  Lyrica for temporary control of nerve pain.  We will follow-up right neck and shoulder pain further during the next visit.  Precautions reviewed with patient expresses understanding and agreement.Marland Kitchen  PDMP not reviewed this encounter. Orders Placed This Encounter  Procedures  . [place] LIMITED JOINT SPACE STRUCTURES LOW LEFT(NO LINKED CHARGES)    Order Specific Question:   Reason for Exam (SYMPTOM  OR DIAGNOSIS REQUIRED)    Answer:   left anterior hip pain    Order Specific Question:   Preferred imaging location?    Answer:   [place]  . DG Pelvis 1-2 Views    Standing Status:   Future    Number of Occurrences:   1    Standing Expiration Date:   10/04/2020    Order Specific Question:   Reason for Exam (SYMPTOM  OR DIAGNOSIS REQUIRED)    Answer:   eval left pelvis pain    Order Specific Question:   Is patient pregnant?    Answer:   No    Order Specific Question:   Preferred imaging location?    Answer:   [facility]    Order Specific Question:   Radiology Contrast Protocol - do NOT remove file path    Answer:   [email or web address]   Meds ordered this encounter  Medications  . pregabalin (LYRICA) 75 MG capsule    Sig: Take 1 capsule (75 mg total) by mouth 2 (two) times daily.    Dispense:  60 capsule    Refill:  3    Discussed warning signs or symptoms. Please see discharge instructions. Patient expresses understanding.   The above documentation has been reviewed and is accurate and complete Lynne Leader

## 2019-08-05 NOTE — Patient Instructions (Signed)
Thank you for coming in today. Get xray now.  Plan for lyrica for nerve pain.  I will arrange for next imaging test to look for artery or vein problem in the groin pressing on the femoral nerve.  Recheck with me in 2 weeks or so.

## 2019-08-06 NOTE — Progress Notes (Signed)
X-ray pelvis is normal.  Blood vessel test should be more accurate.

## 2019-08-07 ENCOUNTER — Encounter: Payer: Self-pay | Admitting: Family Medicine

## 2019-08-16 ENCOUNTER — Ambulatory Visit (HOSPITAL_COMMUNITY)
Admission: RE | Admit: 2019-08-16 | Discharge: 2019-08-16 | Disposition: A | Discharge status: Home / Self Care | Payer: 59 | Source: Ambulatory Visit | Attending: Family Medicine | Admitting: Family Medicine

## 2019-08-16 ENCOUNTER — Other Ambulatory Visit: Payer: Self-pay

## 2019-08-16 ENCOUNTER — Other Ambulatory Visit (HOSPITAL_COMMUNITY): Payer: Self-pay | Admitting: Family Medicine

## 2019-08-16 ENCOUNTER — Ambulatory Visit (INDEPENDENT_AMBULATORY_CARE_PROVIDER_SITE_OTHER)
Admission: RE | Admit: 2019-08-16 | Discharge: 2019-08-16 | Disposition: A | Discharge status: Home / Self Care | Payer: 59 | Source: Ambulatory Visit | Attending: Vascular Surgery | Admitting: Vascular Surgery

## 2019-08-16 ENCOUNTER — Other Ambulatory Visit: Payer: Self-pay | Admitting: *Deleted

## 2019-08-16 DIAGNOSIS — R1032 Left lower quadrant pain: Secondary | ICD-10-CM | POA: Diagnosis present

## 2019-08-16 DIAGNOSIS — M25552 Pain in left hip: Secondary | ICD-10-CM | POA: Diagnosis present

## 2019-08-16 DIAGNOSIS — I724 Aneurysm of artery of lower extremity: Secondary | ICD-10-CM | POA: Insufficient documentation

## 2019-08-16 DIAGNOSIS — M79605 Pain in left leg: Secondary | ICD-10-CM

## 2019-08-16 NOTE — Addendum Note (Signed)
Addended by: Douglass Rivers T on: 08/16/2019 11:01 AM   Modules accepted: Orders

## 2019-08-19 ENCOUNTER — Encounter: Payer: Self-pay | Admitting: Cardiovascular Disease

## 2019-08-19 ENCOUNTER — Encounter: Payer: Self-pay | Admitting: Family Medicine

## 2019-08-19 ENCOUNTER — Ambulatory Visit (INDEPENDENT_AMBULATORY_CARE_PROVIDER_SITE_OTHER): Payer: 59 | Admitting: Family Medicine

## 2019-08-19 ENCOUNTER — Other Ambulatory Visit: Payer: Self-pay

## 2019-08-19 ENCOUNTER — Ambulatory Visit (INDEPENDENT_AMBULATORY_CARE_PROVIDER_SITE_OTHER): Payer: 59

## 2019-08-19 VITALS — BP 130/74 | HR 76 | Ht 64.0 in | Wt 160.8 lb

## 2019-08-19 DIAGNOSIS — M25552 Pain in left hip: Secondary | ICD-10-CM

## 2019-08-19 DIAGNOSIS — M545 Low back pain, unspecified: Secondary | ICD-10-CM

## 2019-08-19 DIAGNOSIS — R1032 Left lower quadrant pain: Secondary | ICD-10-CM | POA: Diagnosis not present

## 2019-08-19 MED ORDER — PREDNISONE 5 MG (48) PO TBPK: ORAL_TABLET | ORAL | 0 refills | Status: DC

## 2019-08-19 NOTE — Patient Instructions (Addendum)
Thank you for coming in today. Get xray today .  Attend PT.  Take prednisone.  Try lyrica (just at night at first).  Plan for MRI .  Recheck after MRI or PT trial.  Keep me updated.

## 2019-08-19 NOTE — Progress Notes (Signed)
Blood flow test in the leg is normal.  Return to clinic for recheck and reevaluation in near future.

## 2019-08-19 NOTE — Progress Notes (Signed)
I, Wendy Poet, LAT, ATC, am serving as scribe for Dr. Lynne Leader.  Suzanne [name] is a 44 y.o. female who presents to [place] at [facility] today for f/u of low back pain w/ radiating pain into her L LE and L hip/groin pain.  She also reported numbness in her B great toes and weakness in her B lower legs and feet.  She was last seen by Dr. Georgina Snell on 08/05/19 and was referred for a B LE arterial duplex study and B LE ABI that she had on 08/16/19.  Since her last visit, pt reports no change in her symptoms.  She notes the majority of her current symptoms are in her low back w/ radiating pain into L anterior thigh.  She describes her L thigh pain as throbbing.  She con't to have numbness in her B great toes.  She states that she took some Flexeril over the weekend due to increased pain.  She states that she has picked up her Lyrica but hasn't taken yet and would like to discuss the possible side effects.   Pertinent review of systems: No fevers or chills  Relevant historical information: Hypertension   Exam:  BP 130/74 (BP Location: Right Arm, Patient Position: Sitting, Cuff Size: Normal)   Pulse 76   Ht 5\' 4"  (1.626 m)   Wt 160 lb 12.8 oz (72.9 kg)   SpO2 95%   BMI 27.60 kg/m  General: Well Developed, well nourished, and in no acute distress.   MSK: L-spine: Nontender to spinal midline.  Decreased lumbar motion. Lower extremity strength is intact distally.  Reflexes are intact.  Sensation is intact. Left hip normal-appearing normal motion.  Pain with resisted hip adduction. Antalgic gait present.    Lab and Radiology Results No results found for this or any previous visit (from the past 72 hour(s)). ABI WITH/WO TBI  Result Date: 08/16/2019 LOWER EXTREMITY DOPPLER STUDY High Risk Factors: Hypertension, past history of smoking. Other Factors: Prominent femoral pulse suggestive of femoral artery aneurysm.                Bilateral leg pain and great toe pain.   Performing Technologist: Delorise Shiner RVT  Examination Guidelines: A complete evaluation includes at minimum, Doppler waveform signals and systolic blood pressure reading at the level of bilateral brachial, anterior tibial, and posterior tibial arteries, when vessel segments are accessible. Bilateral testing is considered an integral part of a complete examination. Photoelectric Plethysmograph (PPG) waveforms and toe systolic pressure readings are included as required and additional duplex testing as needed. Limited examinations for reoccurring indications may be performed as noted.  ABI Findings: +---------+------------------+-----+---------+--------+ Right    Rt Pressure (mmHg)IndexWaveform Comment  +---------+------------------+-----+---------+--------+ Brachial 204                                      +---------+------------------+-----+---------+--------+ ATA      216               1.04                   +---------+------------------+-----+---------+--------+ PTA      236               1.14 triphasic         +---------+------------------+-----+---------+--------+ DP  triphasic         +---------+------------------+-----+---------+--------+ Great Toe150               0.72                   +---------+------------------+-----+---------+--------+ +---------+------------------+-----+---------+-------+ Left     Lt Pressure (mmHg)IndexWaveform Comment +---------+------------------+-----+---------+-------+ Brachial 207                                     +---------+------------------+-----+---------+-------+ ATA      231               1.12                  +---------+------------------+-----+---------+-------+ PTA      244               1.18 triphasic        +---------+------------------+-----+---------+-------+ DP                              triphasic        +---------+------------------+-----+---------+-------+ Great  Toe104               0.50                  +---------+------------------+-----+---------+-------+ +-------+-----------+-----------+------------+------------+ ABI/TBIToday's ABIToday's TBIPrevious ABIPrevious TBI +-------+-----------+-----------+------------+------------+ Right  1.14       0.72                                +-------+-----------+-----------+------------+------------+ Left   1.18       0.50                                +-------+-----------+-----------+------------+------------+  Summary: Right: Resting right ankle-brachial index is within normal range. No evidence of significant right lower extremity arterial disease. The right toe-brachial index is normal. RT great toe pressure = 150 mmHg. Left: Resting left ankle-brachial index is within normal range. No evidence of significant left lower extremity arterial disease. The left toe-brachial index is abnormal. LT Great toe pressure = 104 mmHg.  *See table(s) above for measurements and observations.     Preliminary    VAS [place] LOWER EXTREMITY ARTERIAL DUPLEX  Result Date: 08/16/2019 LOWER EXTREMITY ARTERIAL DUPLEX STUDY High Risk Factors: Past history of smoking. Other Factors: Prominent left femoral pulse suggestive of aneurysm.  Current ABI: Right: 1.14 /0.72, Left: 1.18/ 0.50 Performing Technologist: Delorise Shiner RVT  Examination Guidelines: A complete evaluation includes B-mode imaging, spectral Doppler, color Doppler, and power Doppler as needed of all accessible portions of each vessel. Bilateral testing is considered an integral part of a complete examination. Limited examinations for reoccurring indications may be performed as noted.  +--------------+-------+-----------+--------+--------+-----+--------+ Left PoplitealAP (cm)Transv (cm)WaveformStenosisShapeComments +--------------+-------+-----------+--------+--------+-----+--------+ Proximal      0.62   0.67       biphasic                       +--------------+-------+-----------+--------+--------+-----+--------+ Mid           0.62   0.67                                     +--------------+-------+-----------+--------+--------+-----+--------+  Distal        0.57   0.54                                     +--------------+-------+-----------+--------+--------+-----+--------+  +----------+--------+-----+--------+---------+--------+ LEFT      PSV cm/sRatioStenosisWaveform Comments +----------+--------+-----+--------+---------+--------+ EIA Distal105                  triphasic         +----------+--------+-----+--------+---------+--------+ DFA       45                   triphasic         +----------+--------+-----+--------+---------+--------+ SFA Prox  86                   triphasic         +----------+--------+-----+--------+---------+--------+ SFA Distal83                   triphasic         +----------+--------+-----+--------+---------+--------+ POP Prox  70                   triphasic         +----------+--------+-----+--------+---------+--------+ Left common femoral artery measured 1.0 cm at the maximum diameter. Left CFV measured 1.5 cm at the maximum diameter. Maximum left femoral artery diameter is 0.68 cm.  Summary: Left: No evidence of left leg arterial aneurysm in the inguinal area, throughout the femoral artery and in the popliteal artery.  See table(s) above for measurements and observations.    Preliminary     X-ray images L-spine obtained today personally and independently reviewed. Facet DJD and neuroforaminal stenosis at L5-S1. No acute fractures. Await formal radiology review   Assessment and Plan: 45 y.o. female with left thigh pain ongoing for months worsening recently.  ETS G is a bit unclear.  This could be L3 radiculopathy or isolated thigh muscle injury or femoral nerve injury.  Initial evaluation with vascular ultrasound thankfully was normal. Plan for repeat course of  prednisone and recommending trial of Lyrica.  Reassured patient about Lyrica potential side effects. Additionally will obtain x-ray and MRI and try physical therapy.  MRI for worsening symptoms not improving with typical early conservative management greater than 6 weeks.  Recheck following MRI   PDMP not reviewed this encounter. Orders Placed This Encounter  Procedures  . DG Lumbar Spine Complete    Standing Status:   Future    Number of Occurrences:   1    Standing Expiration Date:   10/18/2020    Order Specific Question:   Reason for Exam (SYMPTOM  OR DIAGNOSIS REQUIRED)    Answer:   eval low back pain and left tigh pain. Poss L3 radicuopathy    Order Specific Question:   Is patient pregnant?    Answer:   No    Order Specific Question:   Preferred imaging location?    Answer:   [facility]    Order Specific Question:   Radiology Contrast Protocol - do NOT remove file path    Answer:   \\[email or web address]\Radiant\DXFluoroContrastProtocols.pdf  . MR Lumbar Spine Wo Contrast    Standing Status:   Future    Standing Expiration Date:   10/18/2020    Order Specific Question:   What is the patient's sedation requirement?    Answer:   No Sedation    Order Specific Question:  Does the patient have a pacemaker or implanted devices?    Answer:   No    Order Specific Question:   Preferred imaging location?    Answer:   [facility] (table limit-350lbs)    Order Specific Question:   Radiology Contrast Protocol - do NOT remove file path    Answer:   \\[email or web address]\[email or web address]\Radiant\mriPROTOCOL.PDF  . Ambulatory referral to Physical Therapy    Referral Priority:   Routine    Referral Type:   Physical Medicine    Referral Reason:   Specialty Services Required    Requested Specialty:   Physical Therapy   Meds ordered this encounter  Medications  . predniSONE (STERAPRED UNI-PAK 48 TAB) 5 MG (48) TBPK tablet    Sig: 12 day dosepack po    Dispense:  48 tablet    Refill:  0      Discussed warning signs or symptoms. Please see discharge instructions. Patient expresses understanding.   The above documentation has been reviewed and is accurate and complete Lynne Leader

## 2019-08-20 NOTE — Progress Notes (Signed)
X-ray lumbar spine looks largely normal

## 2019-09-13 ENCOUNTER — Ambulatory Visit: Payer: 59 | Attending: Family Medicine

## 2019-09-13 ENCOUNTER — Other Ambulatory Visit: Payer: Self-pay

## 2019-09-13 DIAGNOSIS — M5440 Lumbago with sciatica, unspecified side: Secondary | ICD-10-CM | POA: Insufficient documentation

## 2019-09-13 DIAGNOSIS — M25552 Pain in left hip: Secondary | ICD-10-CM | POA: Insufficient documentation

## 2019-09-13 DIAGNOSIS — M25551 Pain in right hip: Secondary | ICD-10-CM | POA: Diagnosis present

## 2019-09-13 DIAGNOSIS — G8929 Other chronic pain: Secondary | ICD-10-CM | POA: Insufficient documentation

## 2019-09-13 DIAGNOSIS — R293 Abnormal posture: Secondary | ICD-10-CM | POA: Diagnosis present

## 2019-09-13 DIAGNOSIS — M6281 Muscle weakness (generalized): Secondary | ICD-10-CM | POA: Insufficient documentation

## 2019-09-13 NOTE — Therapy (Signed)
[address] [place], [place], [address] Phone: [phone]   Fax:  [phone]  Physical Therapy Evaluation  Patient Details  Name: Suzanne [name] MRN: [id number] Date of Birth: 1975/06/18 Referring Provider (PT): Lynne Leader, MD   Encounter Date: 09/13/2019  PT End of Session - 09/13/19 1200    Visit Number  1    Number of Visits  8    Date for PT Re-Evaluation  10/11/19    Authorization Type  [employer]    PT Start Time  1100    PT Stop Time  1145    PT Time Calculation (min)  45 min    Activity Tolerance  Patient limited by pain;Patient tolerated treatment well    Behavior During Therapy  [facility] for tasks assessed/performed       Past Medical History:  Diagnosis Date  . Abnormal Pap smear of cervix   . Allergy   . Anemia   . Anxiety   . Arthritis   . Asthma   . Asthma   . Chest pain   . Chronic lower back pain   . Depression   . Fibroid   . GERD (gastroesophageal reflux disease)   . Hypertension   . Migraine   . Migraines   . Neck pain, chronic   . STD (sexually transmitted disease)    HSV1, in eye    Past Surgical History:  Procedure Laterality Date  . COLPOSCOPY    . ESOPHAGEAL DILATION    . ESOPHAGEAL MANOMETRY N/A 04/01/2013   Procedure: ESOPHAGEAL MANOMETRY (EM);  Surgeon: Milus Banister, MD;  Location: [facility];  Service: Endoscopy;  Laterality: N/A;  . ESOPHAGOGASTRODUODENOSCOPY (EGD) WITH ESOPHAGEAL DILATION N/A 02/28/2013   Procedure: ESOPHAGOGASTRODUODENOSCOPY (EGD) WITH ESOPHAGEAL DILATION;  Surgeon: Milus Banister, MD;  Location: [facility];  Service: Endoscopy;  Laterality: N/A;  . TUBAL LIGATION    . VAGINAL HYSTERECTOMY      There were no vitals filed for this visit.   Subjective Assessment - 09/13/19 1103    Subjective  Pt reports both of her legs hurt all the time that has been ongoing for 4-5 years that has gone undiagnosed. L groin pain began the lats 2-3  months. It "catches her off guard", and she might drop something and has fallen in the shower 2-3x. She will walk around [facility] for 2-3 hours and begin to feel tingling in her hip. She has B LBP across the top of the buttocks. She will swell in her knees sometimes as well, along with hypersensitivity of BLE to where she can't stand the sheets to touch her.    Limitations  Walking    How long can you sit comfortably?  2 hours    How long can you stand comfortably?  20 min    How long can you walk comfortably?  an hour    Diagnostic tests  XR, [place]    Currently in Pain?  Yes    Pain Score  7     Pain Location  Hip    Pain Orientation  Left;Right    Pain Descriptors / Indicators  Shooting;Aching    Pain Type  Chronic pain    Pain Radiating Towards  inner LLE    Aggravating Factors   Cold    Pain Relieving Factors  Flexeril, hot bath         [facility] PT Assessment - 09/13/19 0001  Assessment   Medical Diagnosis  L hip groin pain     Referring Provider (PT)  Lynne Leader, MD    Onset Date/Surgical Date  07/01/19    Hand Dominance  Right    Next MD Visit  Post PT    Prior Therapy  for her back years ago at [place]   Has the patient fallen in the past 6 months  No    Has the patient had a decrease in activity level because of a fear of falling?   Yes    Is the patient reluctant to leave their home because of a fear of falling?   No      ROM / Strength   AROM / PROM / Strength  AROM;PROM      AROM   Overall AROM   Deficits;Due to pain    AROM Assessment Site  Hip;Lumbar    Right/Left Hip  Right;Left    Right Hip External Rotation   35    Right Hip Internal Rotation   51    Left Hip External Rotation   30    Left Hip Internal Rotation   55      Palpation   Spinal mobility  referral of pain to buttocks with lumbar CPAs, 2/6 hypomobility of T/S      Special Tests    Special Tests  Hip Special Tests    Hip Special Tests   Saralyn Pilar (FABER) Test;Ely's Test       Saralyn Pilar [facility]) Test   Findings  Positive    Side  Right;Left    Comments  pain in anterior hip and groin      Ely's Test   Findings  Positive    Side  Right;Left    Comments  pain and hip flexor hypoextensibility                Objective measurements completed on examination: See above findings.      [place] Adult PT Treatment/Exercise - 09/13/19 0001      Exercises   Exercises  Other Exercises    Other Exercises   lumbar decompression in supine with BLE elevated, seated and standing PPT             PT Education - 09/13/19 1200    Education Details  POC, HEP, chronic pain    Person(s) Educated  Patient    Methods  Explanation;Demonstration;Tactile cues;Verbal cues;Handout    Comprehension  Verbalized understanding;Need further instruction       PT Short Term Goals - 09/13/19 1207      PT SHORT TERM GOAL #1   Title  Pt will be I and compliant with initial HEP.    Time  1    Period  Weeks    Status  New    Target Date  09/20/19      PT SHORT TERM GOAL #2   Title  Pt will reduce pain at rest to </= 5/10.    Baseline  7/10    Time  2    Period  Weeks    Status  New    Target Date  09/27/19      PT SHORT TERM GOAL #3   Title  Pt will demonstrate ability for L hip flexion to 100 degrees with </= 5/10 pain.    Time  2    Period  Weeks    Status  New    Target Date  09/27/19        PT Long Term Goals - 09/13/19 1212      PT LONG TERM GOAL #1   Title  Pt will report decrease in pain with L hip mobility to </= 4/10.    Baseline  7+/10    Time  4    Period  Weeks    Status  New    Target Date  10/11/19      PT LONG TERM GOAL #2   Title  Pt will demonstrate 4/5 or greater hip MMT, in order to avoid falls.    Time  4    Period  Weeks    Status  New    Target Date  10/11/19      PT LONG TERM GOAL #3   Title  Pt will demonstrate understanding of advanced HEP with progressions for long term maintenance.    Time  4    Period  Weeks     Status  New    Target Date  10/11/19             Plan - 09/13/19 1201    Clinical Impression Statement  Pt is a 44 yo female with long history of low back and hip pain, but recent onset of L groin pain with associated nerve pain and weakness after maintaining prolonged positions. Evaluation was limited due to pt's pain level. Pain reproduced with L hip flexion, IR, ER and all PROM in prone as well; PROM testing limited with increased AROM noted to hip in prone lying. Avoided strength testing today due to pt's high pain level - will assess as able in future visits. Pt appears to have symptoms of L hip arthritis, as well as potential nerve impingement in low back with referral of pain to B buttocks with any PA to lumbar spine. Pt was educated on prognosis, POC, and HEP - verbalized understanding and consented to tx.    Personal Factors and Comorbidities  Comorbidity 1;Sex;Profession;Past/Current Experience;Time since onset of injury/illness/exacerbation    Comorbidities  HTN    Examination-Activity Limitations  Bed Mobility;Bend;Carry;Squat;Stairs;Sleep;Sit;Transfers;Stand;Lift;Locomotion Level    Examination-Participation Restrictions  Community Activity;Shop    Stability/Clinical Decision Making  Evolving/Moderate complexity    Clinical Decision Making  Moderate    Rehab Potential  Good    PT Frequency  2x / week    PT Duration  4 weeks    PT Treatment/Interventions  ADLs/Self Care Home Management;Iontophoresis 4mg /ml Dexamethasone;Moist Heat;Traction;Electrical Stimulation;Cryotherapy;Therapeutic exercise;Therapeutic activities;Stair training;Functional mobility training;Patient/family education;Taping;Vasopneumatic Device;Spinal Manipulations;Joint Manipulations;Gait training;Balance training;Neuromuscular re-education;Manual techniques;Dry needling;Passive range of motion    PT Next Visit Plan  Assess response to minimal HEP, progress it; attempt manual muscle testing and increasing  strength    PT Home Exercise Plan  Lumbar decompression in supine with BLE elevated, supine and standing posterior pelvic tilt    Consulted and Agree with Plan of Care  Patient       Patient will benefit from skilled therapeutic intervention in order to improve the following deficits and impairments:  Decreased activity tolerance, Impaired flexibility, Impaired perceived functional ability, Hypomobility, Decreased strength, Decreased range of motion, Postural dysfunction, Improper body mechanics, Pain, Impaired sensation, Increased fascial restricitons  Visit Diagnosis: Pain in left hip - Plan: PT plan of care cert/re-cert  Pain in right hip - Plan: PT plan of care cert/re-cert  Chronic low back pain with sciatica, sciatica laterality unspecified, unspecified back pain laterality - Plan: PT plan of care cert/re-cert  Muscle weakness (generalized) -  Plan: PT plan of care cert/re-cert  Abnormal posture - Plan: PT plan of care cert/re-cert     Problem List Patient Active Problem List   Diagnosis Date Noted  . Iron deficiency anemia 10/01/2018  . Thalassemia 10/01/2018  . Galactorrhea 07/19/2018  . Malnutrition of moderate degree ([place]) 07/19/2018  . Abnormal TSH 07/19/2018  . Nummular keratitis of right eye, followed by [facility] 07/19/2018  . Aortic atherosclerosis ([place]) 07/19/2018  . Murmur, cardiac 03/26/2018  . Former smoker, stopped 2019 03/26/2018  . History of hysterectomy 11/25/2016  . Unintentional weight loss 08/17/2016  . B12 deficiency 08/12/2016  . Daily nausea 08/06/2016  . Microcytic anemia 08/06/2016  . Chronic diarrhea 08/06/2016  . Intestinal malabsorption 08/06/2016  . Lung nodule < 6cm on CT 08/06/2016  . Essential hypertension, on Norvasec, Hydralazine, and HCTZ; patient ACE/ARB intolerant 2/2 cough 07/02/2013  . Atypical chest pain 07/02/2013  . Shortness of breath 07/02/2013  . Other and unspecified ovarian cyst 04/01/2013  . Dysphagia 12/22/2011     Izell Homer, PT, DPT 09/13/2019, 12:16 PM  [address] [place], [place], [address] Phone: [phone]   Fax:  [phone]  Name: Suzanne [name] MRN: [id number] Date of Birth: 24-Nov-1975

## 2019-09-19 ENCOUNTER — Other Ambulatory Visit: Payer: Self-pay

## 2019-09-19 ENCOUNTER — Ambulatory Visit: Payer: 59 | Admitting: Physical Therapy

## 2019-09-19 DIAGNOSIS — M25552 Pain in left hip: Secondary | ICD-10-CM | POA: Diagnosis not present

## 2019-09-19 DIAGNOSIS — G8929 Other chronic pain: Secondary | ICD-10-CM

## 2019-09-19 DIAGNOSIS — M6281 Muscle weakness (generalized): Secondary | ICD-10-CM

## 2019-09-19 DIAGNOSIS — M5440 Lumbago with sciatica, unspecified side: Secondary | ICD-10-CM

## 2019-09-19 NOTE — Therapy (Signed)
[place], [place], [address] Phone: [phone]   Fax:  [phone]  Physical Therapy Treatment  Patient Details  Name: Suzanne [name] MRN: [id number] Date of Birth: January 24, 1976 Referring Provider (PT): Lynne Leader, MD   Encounter Date: 09/19/2019  PT End of Session - 09/19/19 1637    Visit Number  2    Number of Visits  8    Date for PT Re-Evaluation  10/11/19    PT Start Time  [id number]    PT Stop Time  1650    PT Time Calculation (min)  40 min       Past Medical History:  Diagnosis Date  . Abnormal Pap smear of cervix   . Allergy   . Anemia   . Anxiety   . Arthritis   . Asthma   . Asthma   . Chest pain   . Chronic lower back pain   . Depression   . Fibroid   . GERD (gastroesophageal reflux disease)   . Hypertension   . Migraine   . Migraines   . Neck pain, chronic   . STD (sexually transmitted disease)    HSV1, in eye    Past Surgical History:  Procedure Laterality Date  . COLPOSCOPY    . ESOPHAGEAL DILATION    . ESOPHAGEAL MANOMETRY N/A 04/01/2013   Procedure: ESOPHAGEAL MANOMETRY (EM);  Surgeon: Milus Banister, MD;  Location: [facility];  Service: Endoscopy;  Laterality: N/A;  . ESOPHAGOGASTRODUODENOSCOPY (EGD) WITH ESOPHAGEAL DILATION N/A 02/28/2013   Procedure: ESOPHAGOGASTRODUODENOSCOPY (EGD) WITH ESOPHAGEAL DILATION;  Surgeon: Milus Banister, MD;  Location: [facility];  Service: Endoscopy;  Laterality: N/A;  . TUBAL LIGATION    . VAGINAL HYSTERECTOMY      There were no vitals filed for this visit.  Subjective Assessment - 09/19/19 1607    Subjective  just hurts all the time in back and Left groin    Currently in Pain?  Yes    Pain Score  7                        [facility] Adult PT Treatment/Exercise - 09/19/19 0001      Exercises   Exercises  Lumbar      Lumbar Exercises: Supine   Ab Set  10 reps;3 seconds   with ball   Clam  10 reps   red  tband   Bent Knee Raise  10 reps;3 seconds   red tband   Bridge  10 reps;Compliant    Bridge with [employer]  10 reps;Compliant;1 second    Other Supine Lumbar Exercises  feet on ball bridge, KTC and obl      Modalities   Modalities  Traction;Moist Heat;Electrical Stimulation      Moist Heat Therapy   Number Minutes Moist Heat  15 Minutes    Moist Heat Location  Lumbar Spine   left groin     Electrical Stimulation   Electrical Stimulation Location  LB    Electrical Stimulation Action  IFC    Electrical Stimulation Parameters  sitting    Electrical Stimulation Goals  Pain      Traction   Type of Traction  Lumbar    Max (lbs)  50    Hold Time  static    Time  6 min   stopped d/t pain radiating down both legs and getting worse  PT Short Term Goals - 09/13/19 1207      PT SHORT TERM GOAL #1   Title  Pt will be I and compliant with initial HEP.    Time  1    Period  Weeks    Status  New    Target Date  09/20/19      PT SHORT TERM GOAL #2   Title  Pt will reduce pain at rest to </= 5/10.    Baseline  7/10    Time  2    Period  Weeks    Status  New    Target Date  09/27/19      PT SHORT TERM GOAL #3   Title  Pt will demonstrate ability for L hip flexion to 100 degrees with </= 5/10 pain.    Time  2    Period  Weeks    Status  New    Target Date  09/27/19        PT Long Term Goals - 09/13/19 1212      PT LONG TERM GOAL #1   Title  Pt will report decrease in pain with L hip mobility to </= 4/10.    Baseline  7+/10    Time  4    Period  Weeks    Status  New    Target Date  10/11/19      PT LONG TERM GOAL #2   Title  Pt will demonstrate 4/5 or greater hip MMT, in order to avoid falls.    Time  4    Period  Weeks    Status  New    Target Date  10/11/19      PT LONG TERM GOAL #3   Title  Pt will demonstrate understanding of advanced HEP with progressions for long term maintenance.    Time  4    Period  Weeks    Status  New     Target Date  10/11/19            Plan - 09/19/19 1638    Clinical Impression Statement  pt very pain limited. tolerated initail ther ex fair ( supine is not a good position fo rher so slight increase) pt was noteably uncomfortale and lots of repositioning. attempted lumbar traction but stopped after 5 min d/t increased BIL LE radiating pain. pt tolerated estim okay but no significant relief and c/o calf cramping, maybe from chair height?    PT Treatment/Interventions  ADLs/Self Care Home Management;Iontophoresis 4mg /ml Dexamethasone;Moist Heat;Traction;Electrical Stimulation;Cryotherapy;Therapeutic exercise;Therapeutic activities;Stair training;Functional mobility training;Patient/family education;Taping;Vasopneumatic Device;Spinal Manipulations;Joint Manipulations;Gait training;Balance training;Neuromuscular re-education;Manual techniques;Dry needling;Passive range of motion    PT Next Visit Plan  Assess response to minimal ex and estim, progress it; attempt manual muscle testing and increasing strength       Patient will benefit from skilled therapeutic intervention in order to improve the following deficits and impairments:  Decreased activity tolerance, Impaired flexibility, Impaired perceived functional ability, Hypomobility, Decreased strength, Decreased range of motion, Postural dysfunction, Improper body mechanics, Pain, Impaired sensation, Increased fascial restricitons  Visit Diagnosis: Pain in left hip  Chronic low back pain with sciatica, sciatica laterality unspecified, unspecified back pain laterality  Muscle weakness (generalized)     Problem List Patient Active Problem List   Diagnosis Date Noted  . Iron deficiency anemia 10/01/2018  . Thalassemia 10/01/2018  . Galactorrhea 07/19/2018  . Malnutrition of moderate degree ([place]) 07/19/2018  . Abnormal TSH 07/19/2018  . Nummular keratitis of right eye,  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/2018  . Aortic atherosclerosis ([place])  07/19/2018  . Murmur, cardiac 03/26/2018  . Former smoker, stopped 2019 03/26/2018  . History of hysterectomy 11/25/2016  . Unintentional weight loss 08/17/2016  . B12 deficiency 08/12/2016  . Daily nausea 08/06/2016  . Microcytic anemia 08/06/2016  . Chronic diarrhea 08/06/2016  . Intestinal malabsorption 08/06/2016  . Lung nodule < 6cm on CT 08/06/2016  . Essential hypertension, on Norvasec, Hydralazine, and HCTZ; patient ACE/ARB intolerant 2/2 cough 07/02/2013  . Atypical chest pain 07/02/2013  . Shortness of breath 07/02/2013  . Other and unspecified ovarian cyst 04/01/2013  . Dysphagia 12/22/2011    Genavie Boettger,ANGIE PTA 09/19/2019, 4:52 PM  [place] [address] [place], [place], [address] Phone: [phone]   Fax:  [phone]  Name: Suzanne [name] MRN: [id number] Date of Birth: Oct 14, 1975

## 2019-09-23 ENCOUNTER — Ambulatory Visit: Payer: 59 | Admitting: Physical Therapy

## 2019-09-23 ENCOUNTER — Other Ambulatory Visit: Payer: Self-pay

## 2019-09-23 ENCOUNTER — Encounter: Payer: Self-pay | Admitting: Physical Therapy

## 2019-09-23 DIAGNOSIS — M6281 Muscle weakness (generalized): Secondary | ICD-10-CM

## 2019-09-23 DIAGNOSIS — G8929 Other chronic pain: Secondary | ICD-10-CM

## 2019-09-23 DIAGNOSIS — M25551 Pain in right hip: Secondary | ICD-10-CM

## 2019-09-23 DIAGNOSIS — M5440 Lumbago with sciatica, unspecified side: Secondary | ICD-10-CM

## 2019-09-23 DIAGNOSIS — R293 Abnormal posture: Secondary | ICD-10-CM

## 2019-09-23 DIAGNOSIS — M25552 Pain in left hip: Secondary | ICD-10-CM | POA: Diagnosis not present

## 2019-09-23 NOTE — Therapy (Signed)
[place], [place], [address] Phone: [phone]   Fax:  [phone]  Physical Therapy Treatment  Patient Details  Name: Suzanne [name] MRN: [id number] Date of Birth: Jul 26, 1975 Referring Provider (PT): Lynne Leader, MD   Encounter Date: 09/23/2019  PT End of Session - 09/23/19 0931    Visit Number  3    Number of Visits  8    Date for PT Re-Evaluation  10/11/19    Authorization Type  [employer]    PT Start Time  0849    PT Stop Time  0936    PT Time Calculation (min)  47 min    Activity Tolerance  Patient limited by pain;Patient tolerated treatment well    Behavior During Therapy  [facility] for tasks assessed/performed       Past Medical History:  Diagnosis Date  . Abnormal Pap smear of cervix   . Allergy   . Anemia   . Anxiety   . Arthritis   . Asthma   . Asthma   . Chest pain   . Chronic lower back pain   . Depression   . Fibroid   . GERD (gastroesophageal reflux disease)   . Hypertension   . Migraine   . Migraines   . Neck pain, chronic   . STD (sexually transmitted disease)    HSV1, in eye    Past Surgical History:  Procedure Laterality Date  . COLPOSCOPY    . ESOPHAGEAL DILATION    . ESOPHAGEAL MANOMETRY N/A 04/01/2013   Procedure: ESOPHAGEAL MANOMETRY (EM);  Surgeon: Milus Banister, MD;  [facility];  Service: Endoscopy;  Laterality: N/A;  . ESOPHAGOGASTRODUODENOSCOPY (EGD) WITH ESOPHAGEAL DILATION N/A 02/28/2013   Procedure: ESOPHAGOGASTRODUODENOSCOPY (EGD) WITH ESOPHAGEAL DILATION;  Surgeon: Milus Banister, MD;  Location[facility];  Service: Endoscopy;  Laterality: N/A;  . TUBAL LIGATION    . VAGINAL HYSTERECTOMY      There were no vitals filed for this visit.  Subjective Assessment - 09/23/19 0901    Subjective  Pt reports this past weekend was especially difficult; LB and L hip are hurting pretty consistently.    Currently in Pain?  Yes    Pain Score  7      Pain Location  Hip    Pain Orientation  Left;Right         [facility] PT Assessment - 09/23/19 0001      ROM / Strength   AROM / PROM / Strength  Strength      Strength   Overall Strength Comments  LE strength 4+/5; shaking with hip flexion strength training                   [facility] Adult PT Treatment/Exercise - 09/23/19 0001      Lumbar Exercises: Stretches   Lower Trunk Rotation  60 seconds      Lumbar Exercises: Aerobic   Recumbent Bike  5 min; stopped early d/t pt reports of pain      Lumbar Exercises: Supine   Clam  10 reps    Clam Limitations  in supine d/t pt inability to lay in sidelying    Bridge  10 reps;3 seconds    Bridge Limitations  cue for PPT    Bridge with Ball Squeeze  10 reps;3 seconds    Bridge with [employer] Limitations  cue for PPT    Other Supine Lumbar Exercises  PPT in  supine hold for 5 sec      Moist Heat Therapy   Number Minutes Moist Heat  15 Minutes    Moist Heat Location  Lumbar Spine      Electrical Stimulation   Electrical Stimulation Location  LB    Electrical Stimulation Action  IFC    Electrical Stimulation Parameters  sitting    Electrical Stimulation Goals  Pain               PT Short Term Goals - 09/13/19 1207      PT SHORT TERM GOAL #1   Title  Pt will be I and compliant with initial HEP.    Time  1    Period  Weeks    Status  New    Target Date  09/20/19      PT SHORT TERM GOAL #2   Title  Pt will reduce pain at rest to </= 5/10.    Baseline  7/10    Time  2    Period  Weeks    Status  New    Target Date  09/27/19      PT SHORT TERM GOAL #3   Title  Pt will demonstrate ability for L hip flexion to 100 degrees with </= 5/10 pain.    Time  2    Period  Weeks    Status  New    Target Date  09/27/19        PT Long Term Goals - 09/13/19 1212      PT LONG TERM GOAL #1   Title  Pt will report decrease in pain with L hip mobility to </= 4/10.    Baseline  7+/10    Time  4    Period  Weeks     Status  New    Target Date  10/11/19      PT LONG TERM GOAL #2   Title  Pt will demonstrate 4/5 or greater hip MMT, in order to avoid falls.    Time  4    Period  Weeks    Status  New    Target Date  10/11/19      PT LONG TERM GOAL #3   Title  Pt will demonstrate understanding of advanced HEP with progressions for long term maintenance.    Time  4    Period  Weeks    Status  New    Target Date  10/11/19            Plan - 09/23/19 0932    Clinical Impression Statement  Pt remains pain limited; pt unable to perform ex's in sidelying. Supine/hooklying also causes mild pain increases. Pt responded well to PPT and lower trunk rotations. Strength testing revealed LE strength 4+/5 overall; noted pain and shakiness with hip flexion MMT. Pt denies bowel and bladder changes or recent falls. Continue to progress TE and flexibility ex's.    PT Treatment/Interventions  ADLs/Self Care Home Management;Iontophoresis 4mg /ml Dexamethasone;Moist Heat;Traction;Electrical Stimulation;Cryotherapy;Therapeutic exercise;Therapeutic activities;Stair training;Functional mobility training;Patient/family education;Taping;Vasopneumatic Device;Spinal Manipulations;Joint Manipulations;Gait training;Balance training;Neuromuscular re-education;Manual techniques;Dry needling;Passive range of motion    PT Next Visit Plan  Assess response to minimal ex and estim, progress it; attempt manual muscle testing and increasing strength    PT Home Exercise Plan  Lumbar decompression in supine with BLE elevated, supine and standing posterior pelvic tilt    Consulted and Agree with Plan of Care  Patient       Patient will benefit from  skilled therapeutic intervention in order to improve the following deficits and impairments:  Decreased activity tolerance, Impaired flexibility, Impaired perceived functional ability, Hypomobility, Decreased strength, Decreased range of motion, Postural dysfunction, Improper body mechanics,  Pain, Impaired sensation, Increased fascial restricitons  Visit Diagnosis: Pain in left hip  Chronic low back pain with sciatica, sciatica laterality unspecified, unspecified back pain laterality  Muscle weakness (generalized)  Pain in right hip  Abnormal posture     Problem List Patient Active Problem List   Diagnosis Date Noted  . Iron deficiency anemia 10/01/2018  . Thalassemia 10/01/2018  . Galactorrhea 07/19/2018  . Malnutrition of moderate degree ([place]) 07/19/2018  . Abnormal TSH 07/19/2018  . Nummular keratitis of right eye, followed by [facility] 07/19/2018  . Aortic atherosclerosis ([place]) 07/19/2018  . Murmur, cardiac 03/26/2018  . Former smoker, stopped 2019 03/26/2018  . History of hysterectomy 11/25/2016  . Unintentional weight loss 08/17/2016  . B12 deficiency 08/12/2016  . Daily nausea 08/06/2016  . Microcytic anemia 08/06/2016  . Chronic diarrhea 08/06/2016  . Intestinal malabsorption 08/06/2016  . Lung nodule < 6cm on CT 08/06/2016  . Essential hypertension, on Norvasec, Hydralazine, and HCTZ; patient ACE/ARB intolerant 2/2 cough 07/02/2013  . Atypical chest pain 07/02/2013  . Shortness of breath 07/02/2013  . Other and unspecified ovarian cyst 04/01/2013  . Dysphagia 12/22/2011   Amador Cunas, PT, DPT Donald Prose Vernice Mannina 09/23/2019, 9:35 AM  [place] [address] [place], [place], [address] Phone: [phone]   Fax:  [phone]  Name: KINDYL BARSCH MRN: [id number] Date of Birth: Dec 13, 1975

## 2019-09-27 ENCOUNTER — Ambulatory Visit: Payer: 59 | Admitting: Physical Therapy

## 2019-09-27 ENCOUNTER — Other Ambulatory Visit: Payer: Self-pay

## 2019-09-27 DIAGNOSIS — R293 Abnormal posture: Secondary | ICD-10-CM

## 2019-09-27 DIAGNOSIS — M25551 Pain in right hip: Secondary | ICD-10-CM

## 2019-09-27 DIAGNOSIS — M25552 Pain in left hip: Secondary | ICD-10-CM | POA: Diagnosis not present

## 2019-09-27 DIAGNOSIS — M6281 Muscle weakness (generalized): Secondary | ICD-10-CM

## 2019-09-27 DIAGNOSIS — G8929 Other chronic pain: Secondary | ICD-10-CM

## 2019-09-27 NOTE — Therapy (Signed)
[place], [place], [address] Phone: [phone]   Fax:  [phone]  Physical Therapy Treatment  Patient Details  Name: Suzanne [name] MRN: [id number] Date of Birth: [birth year] Referring Provider (PT): Lynne Leader, MD   Encounter Date: 09/27/2019  PT End of Session - 09/27/19 1041    Visit Number  4    Number of Visits  8    Date for PT Re-Evaluation  10/11/19    PT Start Time  1010    PT Stop Time  1100    PT Time Calculation (min)  50 min       Past Medical History:  Diagnosis Date  . Abnormal Pap smear of cervix   . Allergy   . Anemia   . Anxiety   . Arthritis   . Asthma   . Asthma   . Chest pain   . Chronic lower back pain   . Depression   . Fibroid   . GERD (gastroesophageal reflux disease)   . Hypertension   . Migraine   . Migraines   . Neck pain, chronic   . STD (sexually transmitted disease)    HSV1, in eye    Past Surgical History:  Procedure Laterality Date  . COLPOSCOPY    . ESOPHAGEAL DILATION    . ESOPHAGEAL MANOMETRY N/A 04/01/2013   Procedure: ESOPHAGEAL MANOMETRY (EM);  Surgeon: Milus Banister, MD;  [facility];  Service: Endoscopy;  Laterality: N/A;  . ESOPHAGOGASTRODUODENOSCOPY (EGD) WITH ESOPHAGEAL DILATION N/A 02/28/2013   Procedure: ESOPHAGOGASTRODUODENOSCOPY (EGD) WITH ESOPHAGEAL DILATION;  Surgeon: Milus Banister, MD;  [facility];  Service: Endoscopy;  Laterality: N/A;  . TUBAL LIGATION    . VAGINAL HYSTERECTOMY      There were no vitals filed for this visit.  Subjective Assessment - 09/27/19 1009    Subjective  kinda rough last couple days. pt does verb walking more    Currently in Pain?  Yes    Pain Score  8     Pain Location  Hip    Pain Orientation  Left;Right                       [facility] Adult PT Treatment/Exercise - 09/27/19 0001      Lumbar Exercises: Aerobic   Recumbent Bike  5 min L 3      Lumbar Exercises: Machines for Strengthening   Cybex Knee Extension  5# 10x    Cybex Knee Flexion  20# 10x      Lumbar Exercises: Standing   Other Standing Lumbar Exercises  hip 3 way red tband 10 reps BIL    Other Standing Lumbar Exercises  resisted gait fwd and laterally with tband      Lumbar Exercises: Seated   Other Seated Lumbar Exercises  sit fit pelvic ROM 10 4 way   scap stab on sit fit 3 way 10 each   Other Seated Lumbar Exercises  red tband on LE sit fit 2 sets 10   hip flex,abd, LAQ and HS curl. add ball squeeze     Lumbar Exercises: Supine   Ab Set  20 reps;3 seconds   seated with physioball   Clam  --   red tband on sit fit     Moist Heat Therapy   Number Minutes Moist Heat  15 Minutes    Moist Heat Location  Lumbar Spine  Engineer, technical sales  IFC    Electrical Stimulation Parameters  sitting    Electrical Stimulation Goals  Pain               PT Short Term Goals - 09/27/19 1045      PT SHORT TERM GOAL #1   Title  Pt will be I and compliant with initial HEP.    Status  Partially Met        PT Long Term Goals - 09/13/19 1212      PT LONG TERM GOAL #1   Title  Pt will report decrease in pain with L hip mobility to </= 4/10.    Baseline  7+/10    Time  4    Period  Weeks    Status  New    Target Date  10/11/19      PT LONG TERM GOAL #2   Title  Pt will demonstrate 4/5 or greater hip MMT, in order to avoid falls.    Time  4    Period  Weeks    Status  New    Target Date  10/11/19      PT LONG TERM GOAL #3   Title  Pt will demonstrate understanding of advanced HEP with progressions for long term maintenance.    Time  4    Period  Weeks    Status  New    Target Date  10/11/19            Plan - 09/27/19 1042    Clinical Impression Statement  pt was able to tolerate more ex and mvmt today, most c/o pain was with hip abd and abd esp ball squeeze and  lateral resisted gait. encouraged pt to continue walking at home    PT Treatment/Interventions  ADLs/Self Care Home Management;Iontophoresis 3m/ml Dexamethasone;Moist Heat;Traction;Electrical Stimulation;Cryotherapy;Therapeutic exercise;Therapeutic activities;Stair training;Functional mobility training;Patient/family education;Taping;Vasopneumatic Device;Spinal Manipulations;Joint Manipulations;Gait training;Balance training;Neuromuscular re-education;Manual techniques;Dry needling;Passive range of motion    PT Next Visit Plan  progress HEP and possible try stretching but pt has difficulty lying supine       Patient will benefit from skilled therapeutic intervention in order to improve the following deficits and impairments:  Decreased activity tolerance, Impaired flexibility, Impaired perceived functional ability, Hypomobility, Decreased strength, Decreased range of motion, Postural dysfunction, Improper body mechanics, Pain, Impaired sensation, Increased fascial restricitons  Visit Diagnosis: Chronic low back pain with sciatica, sciatica laterality unspecified, unspecified back pain laterality  Pain in left hip  Muscle weakness (generalized)  Pain in right hip  Abnormal posture     Problem List Patient Active Problem List   Diagnosis Date Noted  . Iron deficiency anemia 10/01/2018  . Thalassemia 10/01/2018  . Galactorrhea 07/19/2018  . [place] 07/19/2018  . Abnormal TSH 07/19/2018  . [facility]  . [place] 07/19/2018  . Murmur, cardiac 03/26/2018  . Former smoker, stopped 2019 03/26/2018  . History of hysterectomy 11/25/2016  . Unintentional weight loss 08/17/2016  . B12 deficiency 08/12/2016  . Daily nausea 08/06/2016  . Microcytic anemia 08/06/2016  . Chronic diarrhea 08/06/2016  . Intestinal malabsorption 08/06/2016  . Lung nodule < 6cm on CT 08/06/2016  . Essential  hypertension, on Norvasec, Hydralazine, and HCTZ; patient ACE/ARB intolerant 2/2 cough 07/02/2013  . Atypical chest pain 07/02/2013  . Shortness of breath 07/02/2013  . Other and unspecified  ovarian cyst 04/01/2013  . Dysphagia 12/22/2011    ,ANGIE PTA 09/27/2019, 10:45 AM  [place] [place] [address] [place], [place], [address] Phone: [phone]   Fax:  [phone]  Name: Suzanne [name] MRN: [id number] Date of Birth: 17-Nov-1975

## 2019-09-30 ENCOUNTER — Encounter: Payer: 59 | Admitting: Physical Therapy

## 2019-10-02 ENCOUNTER — Ambulatory Visit: Payer: 59 | Attending: Family Medicine | Admitting: Physical Therapy

## 2019-10-02 ENCOUNTER — Encounter: Payer: Self-pay | Admitting: Physical Therapy

## 2019-10-02 ENCOUNTER — Other Ambulatory Visit: Payer: Self-pay

## 2019-10-02 DIAGNOSIS — R293 Abnormal posture: Secondary | ICD-10-CM | POA: Insufficient documentation

## 2019-10-02 DIAGNOSIS — M25551 Pain in right hip: Secondary | ICD-10-CM | POA: Insufficient documentation

## 2019-10-02 DIAGNOSIS — M5440 Lumbago with sciatica, unspecified side: Secondary | ICD-10-CM | POA: Diagnosis present

## 2019-10-02 DIAGNOSIS — M25552 Pain in left hip: Secondary | ICD-10-CM | POA: Diagnosis present

## 2019-10-02 DIAGNOSIS — G8929 Other chronic pain: Secondary | ICD-10-CM

## 2019-10-02 DIAGNOSIS — M6281 Muscle weakness (generalized): Secondary | ICD-10-CM

## 2019-10-02 NOTE — Therapy (Signed)
[facility] [place], [place], [address] Phone: [phone]   Fax:  [phone]  Physical Therapy Treatment  Patient Details  Name: Suzanne [name] MRN: [id number] Date of Birth: 05-12-1976 Referring Provider (PT): Lynne Leader, MD   Encounter Date: 10/02/2019  PT End of Session - 10/02/19 1651    Visit Number  5    Number of Visits  8    Date for PT Re-Evaluation  10/11/19    PT Start Time  5993    PT Stop Time  1705    PT Time Calculation (min)  50 min    Activity Tolerance  Patient limited by pain    Behavior During Therapy  [facility] for tasks assessed/performed       Past Medical History:  Diagnosis Date  . Abnormal Pap smear of cervix   . Allergy   . Anemia   . Anxiety   . Arthritis   . Asthma   . Asthma   . Chest pain   . Chronic lower back pain   . Depression   . Fibroid   . GERD (gastroesophageal reflux disease)   . Hypertension   . Migraine   . Migraines   . Neck pain, chronic   . STD (sexually transmitted disease)    HSV1, in eye    Past Surgical History:  Procedure Laterality Date  . COLPOSCOPY    . ESOPHAGEAL DILATION    . ESOPHAGEAL MANOMETRY N/A 04/01/2013   Procedure: ESOPHAGEAL MANOMETRY (EM);  Surgeon: Milus Banister, MD;  Location: [facility];  Service: Endoscopy;  Laterality: N/A;  . ESOPHAGOGASTRODUODENOSCOPY (EGD) WITH ESOPHAGEAL DILATION N/A 02/28/2013   Procedure: ESOPHAGOGASTRODUODENOSCOPY (EGD) WITH ESOPHAGEAL DILATION;  Surgeon: Milus Banister, MD;  Location: [facility];  Service: Endoscopy;  Laterality: N/A;  . TUBAL LIGATION    . VAGINAL HYSTERECTOMY      There were no vitals filed for this visit.  Subjective Assessment - 10/02/19 1620    Subjective  Pt reports that she is sore today in her L hip.    Currently in Pain?  Yes    Pain Score  6     Pain Location  Hip    Pain Orientation  Left                       [facility] Adult PT  Treatment/Exercise - 10/02/19 0001      Lumbar Exercises: Stretches   Double Knee to Chest Stretch  5 reps;10 seconds    Double Knee to Chest Stretch Limitations  supine with exercise ball    Lower Trunk Rotation  5 reps;10 seconds    Lower Trunk Rotation Limitations  with exercise ball      Lumbar Exercises: Aerobic   Nustep  L3 x4mn      Lumbar Exercises: Standing   Other Standing Lumbar Exercises  resisted gait fwd and lateral with 10#; difficulty maintaining balance with resisted abduciton      Modalities   Modalities  Iontophoresis;Moist Heat      Moist Heat Therapy   Number Minutes Moist Heat  15 Minutes    Moist Heat Location  Lumbar Spine;Hip      Iontophoresis   Type of Iontophoresis  Dexamethasone    Location  L hip    Dose  842m     Manual Therapy   Manual Therapy  Soft tissue mobilization;Passive ROM    Soft  tissue mobilization  STM to L hip/glute/piriformis    Passive ROM  hip flexor stretch and prone quad stretch               PT Short Term Goals - 09/27/19 1045      PT SHORT TERM GOAL #1   Title  Pt will be I and compliant with initial HEP.    Status  Partially Met        PT Long Term Goals - 09/13/19 1212      PT LONG TERM GOAL #1   Title  Pt will report decrease in pain with L hip mobility to </= 4/10.    Baseline  7+/10    Time  4    Period  Weeks    Status  New    Target Date  10/11/19      PT LONG TERM GOAL #2   Title  Pt will demonstrate 4/5 or greater hip MMT, in order to avoid falls.    Time  4    Period  Weeks    Status  New    Target Date  10/11/19      PT LONG TERM GOAL #3   Title  Pt will demonstrate understanding of advanced HEP with progressions for long term maintenance.    Time  4    Period  Weeks    Status  New    Target Date  10/11/19            Plan - 10/02/19 1651    Clinical Impression Statement  Pt tolerated ex's today but continues to be limited by pain particularly with hip abduction ex's. Pt  has pain with stretching and STM but feels relief with heat. Pt expressed interest in trying ionto for pain.    PT Treatment/Interventions  ADLs/Self Care Home Management;Iontophoresis '4mg'$ /ml Dexamethasone;Moist Heat;Traction;Electrical Stimulation;Cryotherapy;Therapeutic exercise;Therapeutic activities;Stair training;Functional mobility training;Patient/family education;Taping;Vasopneumatic Device;Spinal Manipulations;Joint Manipulations;Gait training;Balance training;Neuromuscular re-education;Manual techniques;Dry needling;Passive range of motion    PT Next Visit Plan  progress HEP and possible try stretching but pt has difficulty lying supine    Consulted and Agree with Plan of Care  Patient       Patient will benefit from skilled therapeutic intervention in order to improve the following deficits and impairments:     Visit Diagnosis: Chronic low back pain with sciatica, sciatica laterality unspecified, unspecified back pain laterality  Pain in left hip  Muscle weakness (generalized)  Pain in right hip  Abnormal posture     Problem List Patient Active Problem List   Diagnosis Date Noted  . Iron deficiency anemia 10/01/2018  . Thalassemia 10/01/2018  . Galactorrhea 07/19/2018  . Malnutrition of moderate degree ([place]) 07/19/2018  . Abnormal TSH 07/19/2018  . Nummular keratitis of right eye, followed by [facility] 07/19/2018  . Aortic atherosclerosis ([place]) 07/19/2018  . Murmur, cardiac 03/26/2018  . Former smoker, stopped 2019 03/26/2018  . History of hysterectomy 11/25/2016  . Unintentional weight loss 08/17/2016  . B12 deficiency 08/12/2016  . Daily nausea 08/06/2016  . Microcytic anemia 08/06/2016  . Chronic diarrhea 08/06/2016  . Intestinal malabsorption 08/06/2016  . Lung nodule < 6cm on CT 08/06/2016  . Essential hypertension, on Norvasec, Hydralazine, and HCTZ; patient ACE/ARB intolerant 2/2 cough 07/02/2013  . Atypical chest pain 07/02/2013  . Shortness of breath  07/02/2013  . Other and unspecified ovarian cyst 04/01/2013  . Dysphagia 12/22/2011   Amador Cunas, PT, DPT Donald Prose Bell Cai 10/02/2019, 4:54 PM  [facility]  [address] [place], [place], [address] Phone: [phone]   Fax:  [phone]  Name: Suzanne [name] MRN: [id number] Date of Birth: Apr 09, 1976

## 2019-10-04 ENCOUNTER — Ambulatory Visit (INDEPENDENT_AMBULATORY_CARE_PROVIDER_SITE_OTHER): Payer: 59 | Admitting: Physician Assistant

## 2019-10-04 ENCOUNTER — Ambulatory Visit
Admission: EM | Admit: 2019-10-04 | Discharge: 2019-10-04 | Disposition: A | Discharge status: Home / Self Care | Payer: 59 | Attending: Emergency Medicine | Admitting: Emergency Medicine

## 2019-10-04 ENCOUNTER — Encounter: Payer: Self-pay | Admitting: Physical Therapy

## 2019-10-04 ENCOUNTER — Encounter: Payer: Self-pay | Admitting: Emergency Medicine

## 2019-10-04 ENCOUNTER — Encounter: Payer: Self-pay | Admitting: Physician Assistant

## 2019-10-04 ENCOUNTER — Ambulatory Visit: Payer: 59 | Admitting: Physical Therapy

## 2019-10-04 ENCOUNTER — Other Ambulatory Visit: Payer: Self-pay

## 2019-10-04 DIAGNOSIS — M25551 Pain in right hip: Secondary | ICD-10-CM

## 2019-10-04 DIAGNOSIS — R071 Chest pain on breathing: Secondary | ICD-10-CM | POA: Diagnosis not present

## 2019-10-04 DIAGNOSIS — G8929 Other chronic pain: Secondary | ICD-10-CM

## 2019-10-04 DIAGNOSIS — M6281 Muscle weakness (generalized): Secondary | ICD-10-CM

## 2019-10-04 DIAGNOSIS — J4531 Mild persistent asthma with (acute) exacerbation: Secondary | ICD-10-CM

## 2019-10-04 DIAGNOSIS — R062 Wheezing: Secondary | ICD-10-CM

## 2019-10-04 DIAGNOSIS — J302 Other seasonal allergic rhinitis: Secondary | ICD-10-CM

## 2019-10-04 DIAGNOSIS — M25552 Pain in left hip: Secondary | ICD-10-CM

## 2019-10-04 DIAGNOSIS — M5440 Lumbago with sciatica, unspecified side: Secondary | ICD-10-CM | POA: Diagnosis not present

## 2019-10-04 DIAGNOSIS — R293 Abnormal posture: Secondary | ICD-10-CM

## 2019-10-04 MED ORDER — FLUTICASONE PROPIONATE 50 MCG/ACT NA SUSP: 1.0000 | Freq: Every day | NASAL | 0 refills | Status: DC

## 2019-10-04 MED ORDER — ALBUTEROL SULFATE (2.5 MG/3ML) 0.083% IN NEBU: 2.5000 mg | INHALATION_SOLUTION | RESPIRATORY_TRACT | 0 refills | Status: DC | PRN

## 2019-10-04 MED ORDER — BENZONATATE 100 MG PO CAPS: 100.0000 mg | ORAL_CAPSULE | Freq: Three times a day (TID) | ORAL | 0 refills | Status: DC

## 2019-10-04 MED ORDER — PREDNISONE 20 MG PO TABS: 20.0000 mg | ORAL_TABLET | Freq: Every day | ORAL | 0 refills | Status: DC

## 2019-10-04 MED ORDER — CETIRIZINE HCL 10 MG PO TABS: 10.0000 mg | ORAL_TABLET | Freq: Every day | ORAL | 0 refills | Status: DC

## 2019-10-04 NOTE — ED Provider Notes (Signed)
[name]    CSN: [id number] Arrival date & time: 10/04/19  0932      History   Chief Complaint Chief Complaint  Patient presents with  . Asthma    HPI Suzanne [name] is a 44 y.o. female with history of hypertension, asthma, allergies presenting for worsening cough, wheezing, shortness of breath x1 week.  Has used her daughter's nebulizer: Initially had relief, though worsening.  States cough is mildly productive with clear to white sputum.  No blood, chest pain, fever.  Denies known sick contacts.  Has not received Covid vaccine.  Not take anything for seasonal allergies.  Denies hospitalizations for respiratory issues.   Past Medical History:  Diagnosis Date  . Abnormal Pap smear of cervix   . Allergy   . Anemia   . Anxiety   . Arthritis   . Asthma   . Asthma   . Chest pain   . Chronic lower back pain   . Depression   . Fibroid   . GERD (gastroesophageal reflux disease)   . Hypertension   . Migraine   . Migraines   . Neck pain, chronic   . STD (sexually transmitted disease)    HSV1, in eye    Patient Active Problem List   Diagnosis Date Noted  . Iron deficiency anemia 10/01/2018  . Thalassemia 10/01/2018  . Galactorrhea 07/19/2018  . Malnutrition of moderate degree ([place]) 07/19/2018  . Abnormal TSH 07/19/2018  . Nummular keratitis of right eye, followed by [facility] 07/19/2018  . Aortic atherosclerosis ([place]) 07/19/2018  . Murmur, cardiac 03/26/2018  . Former smoker, stopped 2019 03/26/2018  . History of hysterectomy 11/25/2016  . Unintentional weight loss 08/17/2016  . B12 deficiency 08/12/2016  . Daily nausea 08/06/2016  . Microcytic anemia 08/06/2016  . Chronic diarrhea 08/06/2016  . Intestinal malabsorption 08/06/2016  . Lung nodule < 6cm on CT 08/06/2016  . Essential hypertension, on Norvasec, Hydralazine, and HCTZ; patient ACE/ARB intolerant 2/2 cough 07/02/2013  . Atypical chest pain 07/02/2013  . Shortness of breath 07/02/2013  . Other  and unspecified ovarian cyst 04/01/2013  . Dysphagia 12/22/2011    Past Surgical History:  Procedure Laterality Date  . COLPOSCOPY    . ESOPHAGEAL DILATION    . ESOPHAGEAL MANOMETRY N/A 04/01/2013   Procedure: ESOPHAGEAL MANOMETRY (EM);  Surgeon: Milus Banister, MD;  [facility];  Service: Endoscopy;  Laterality: N/A;  . ESOPHAGOGASTRODUODENOSCOPY (EGD) WITH ESOPHAGEAL DILATION N/A 02/28/2013   Procedure: ESOPHAGOGASTRODUODENOSCOPY (EGD) WITH ESOPHAGEAL DILATION;  Surgeon: Milus Banister, MD;  [facility];  Service: Endoscopy;  Laterality: N/A;  . TUBAL LIGATION    . VAGINAL HYSTERECTOMY      OB History    Gravida  3   Para  2   Term      Preterm  2   AB  1   Living  1     SAB      TAB  1   Ectopic      Multiple      Live Births  2            Home Medications    Prior to Admission medications   Medication Sig Start Date End Date Taking? Authorizing Provider  amLODipine (NORVASC) 10 MG tablet Take 1 tablet (10 mg total) by mouth daily. 07/31/19  Yes Inda Coke, PA  hydrALAZINE (APRESOLINE) 25 MG tablet Take 1 tablet (25 mg total) by mouth 2 (two) times daily. 07/31/19  Yes  Inda Coke, PA  hydrochlorothiazide (HYDRODIURIL) 25 MG tablet Take 1 tablet (25 mg total) by mouth daily. 07/31/19  Yes Inda Coke, PA  albuterol (PROVENTIL) (2.5 MG/3ML) 0.083% nebulizer solution Take 3 mLs (2.5 mg total) by nebulization every 4 (four) hours as needed for wheezing or shortness of breath. 10/04/19   Hall-Potvin, [place], PA-C  benzonatate (TESSALON) 100 MG capsule Take 1 capsule (100 mg total) by mouth every 8 (eight) hours. 10/04/19   Hall-Potvin, [place], PA-C  cetirizine (ZYRTEC ALLERGY) 10 MG tablet Take 1 tablet (10 mg total) by mouth daily. 10/04/19   Hall-Potvin, [place], PA-C  fluticasone (FLONASE) 50 MCG/ACT nasal spray Place 1 spray into both nostrils daily. 10/04/19   Hall-Potvin, [place], PA-C  Multiple Vitamins-Calcium (ONE-A-DAY  WOMENS FORMULA PO) Take 1 tablet by mouth daily.    [provider]  omeprazole (PRILOSEC) 20 MG capsule Take 1 capsule (20 mg total) by mouth daily. 11/03/17   Briscoe Deutscher, DO  predniSONE (DELTASONE) 20 MG tablet Take 1 tablet (20 mg total) by mouth daily. 10/04/19   Hall-Potvin, [place], PA-C  valACYclovir (VALTREX) 1000 MG tablet Take 1,000 mg by mouth daily.     [provider]    Family History Family History  Problem Relation Age of Onset  . Heart disease Father        H/O CABG  . Hypertension Mother   . Hypertension Maternal Grandmother   . Thyroid disease Maternal Grandmother   . Diabetes Maternal Grandmother   . Hypertension Paternal Grandmother   . Diabetes Paternal Grandmother   . Cancer - Lung Paternal Grandmother   . Hypertension Maternal Aunt   . Thyroid disease Maternal Aunt     Social History Social History   Tobacco Use  . Smoking status: Former Smoker    Packs/day: 0.10    Years: 10.00    Pack years: 1.00    Types: Cigars    Quit date: 12/06/2017    Years since quitting: 1.8  . Smokeless tobacco: Never Used  . Tobacco comment: smokes 2 Black & Milds per day 08/19/13  Substance Use Topics  . Alcohol use: Not Currently  . Drug use: Not Currently     Allergies   Reglan [metoclopramide], Lisinopril, Losartan, Sulfa antibiotics, Amoxicillin, and Contrast media [iodinated diagnostic agents]   Review of Systems As per HPI  Physical Exam Triage Vital Signs ED Triage Vitals [10/04/19 0937]  Enc Vitals Group     BP (!) 176/93     Pulse Rate 61     Resp 16     Temp 98.4 F (36.9 C)     Temp src      SpO2 99 %     Weight      Height      Head Circumference      Peak Flow      Pain Score 7     Pain Loc      Pain Edu?      Excl. in [place]?    No data found.  Updated Vital Signs BP (!) 176/93   Pulse 61   Temp 98.4 F (36.9 C)   Resp 16   SpO2 99%   Visual Acuity Right Eye Distance:   Left Eye Distance:   Bilateral  Distance:    Right Eye Near:   Left Eye Near:    Bilateral Near:     Physical Exam Constitutional:      General: She is not in acute distress. HENT:  Head: Normocephalic and atraumatic.  Eyes:     General: No scleral icterus.    Pupils: Pupils are equal, round, and reactive to light.  Cardiovascular:     Rate and Rhythm: Normal rate.  Pulmonary:     Effort: Pulmonary effort is normal. No respiratory distress.     Breath sounds: No stridor. Wheezing present. No rhonchi or rales.     Comments: Good air entry bilaterally without prolonged expiratory phase. Skin:    Coloration: Skin is not jaundiced or pale.  Neurological:     Mental Status: She is alert and oriented to person, place, and time.      UC Treatments / Results  Labs (all labs ordered are listed, but only abnormal results are displayed) Labs Reviewed - No data to display  EKG   Radiology No results found.  Procedures Procedures (including critical care time)  Medications Ordered in UC Medications - No data to display  Initial Impression / Assessment and Plan / UC Course  I have reviewed the triage vital signs and the nursing notes.  Pertinent labs & imaging results that were available during my care of the patient were reviewed by me and considered in my medical decision making (see chart for details).     Afebrile, nontoxic in office today.  Likely asthma exacerbation second to seasonal allergies.  Could also consider Covid, though patient is already experienced 1 week of symptoms: We will provide work note to allow her to stay home through rest of 10-day course.  Patient electing to defer Covid testing at this time.  Return precautions discussed, patient verbalized understanding and is agreeable to plan. Final Clinical Impressions(s) / UC Diagnoses   Final diagnoses:  Mild persistent asthma with acute exacerbation  Seasonal allergies     Discharge Instructions     Tessalon for cough. Start  flonase, atrovent nasal spray for nasal congestion/drainage. You can use over the counter nasal saline rinse such as neti pot for nasal congestion. Keep hydrated, your urine should be clear to pale yellow in color. Tylenol/motrin for fever and pain. Monitor for any worsening of symptoms, chest pain, shortness of breath, wheezing, swelling of the throat, go to the emergency department for further evaluation needed.     ED Prescriptions    Medication Sig Dispense Auth. Provider   albuterol (PROVENTIL) (2.5 MG/3ML) 0.083% nebulizer solution Take 3 mLs (2.5 mg total) by nebulization every 4 (four) hours as needed for wheezing or shortness of breath. 75 mL Hall-Potvin, [place], PA-C   predniSONE (DELTASONE) 20 MG tablet Take 1 tablet (20 mg total) by mouth daily. 5 tablet Hall-Potvin, [place], PA-C   benzonatate (TESSALON) 100 MG capsule Take 1 capsule (100 mg total) by mouth every 8 (eight) hours. 21 capsule Hall-Potvin, [place], PA-C   cetirizine (ZYRTEC ALLERGY) 10 MG tablet Take 1 tablet (10 mg total) by mouth daily. 30 tablet Hall-Potvin, [place], PA-C   fluticasone (FLONASE) 50 MCG/ACT nasal spray Place 1 spray into both nostrils daily. 16 g Hall-Potvin, [place], PA-C     PDMP not reviewed this encounter.   Hall-Potvin, [place], [place] 10/04/19 [phone]

## 2019-10-04 NOTE — Discharge Instructions (Addendum)

## 2019-10-04 NOTE — ED Triage Notes (Signed)
Pt c/o increasing SOB x 1 week, home nebulizer not helping

## 2019-10-04 NOTE — Progress Notes (Signed)
TELEPHONE ENCOUNTER   Patient verbally agreed to telephone visit and is aware that copayment and coinsurance may apply. Patient was treated using telemedicine according to accepted telemedicine protocols.  Location of the patient: home Location of provider: [place] of all persons participating in the telemedicine service and role in the encounter: Inda Coke, [place] , Shauna Hugh  Subjective:   Chief Complaint  Patient presents with  . Wheezing  . Chest Pain     HPI    Patient has been having chest pain and wheezing x 1 week without any improvement. Started over the weekend and worsened on Monday. She reports that she has been using her daughters nebulizer machine and doing treatments every 2 hours. Hx of asthma, but has not used an inhaler in years.  Also endorses chest tightness on left side of her breast. Mostly with deep inspiration. States that pain at its worse is 8/10. Taking blood pressure medications regularly, although per our records she hasn't had adequate refills of this medication. She states that her PT has been treating her back pain and they take her BP at the visits and it is normal.  Having white phlegm with cough. Getting SOB at night. Symptoms are getting worse each day. Appetite is poor.   Denies: fever, chills, exposure to COVID-19  She has not tried any other medications or interventions.   Patient Active Problem List   Diagnosis Date Noted  . Iron deficiency anemia 10/01/2018  . Thalassemia 10/01/2018  . Galactorrhea 07/19/2018  . Malnutrition of moderate degree ([place]) 07/19/2018  . Abnormal TSH 07/19/2018  . Nummular keratitis of right eye, followed by [facility] 07/19/2018  . Aortic atherosclerosis ([place]) 07/19/2018  . Murmur, cardiac 03/26/2018  . Former smoker, stopped 2019 03/26/2018  . History of hysterectomy 11/25/2016  . Unintentional weight loss 08/17/2016  . B12 deficiency 08/12/2016  . Daily nausea 08/06/2016  .  Microcytic anemia 08/06/2016  . Chronic diarrhea 08/06/2016  . Intestinal malabsorption 08/06/2016  . Lung nodule < 6cm on CT 08/06/2016  . Essential hypertension, on Norvasec, Hydralazine, and HCTZ; patient ACE/ARB intolerant 2/2 cough 07/02/2013  . Atypical chest pain 07/02/2013  . Shortness of breath 07/02/2013  . Other and unspecified ovarian cyst 04/01/2013  . Dysphagia 12/22/2011   Social History   Tobacco Use  . Smoking status: Former Smoker    Packs/day: 0.10    Years: 10.00    Pack years: 1.00    Types: Cigars    Quit date: 12/06/2017    Years since quitting: 1.8  . Smokeless tobacco: Never Used  . Tobacco comment: smokes 2 Black & Milds per day 08/19/13  Substance Use Topics  . Alcohol use: Not Currently    Current Outpatient Medications:  .  albuterol (PROVENTIL) (2.5 MG/3ML) 0.083% nebulizer solution, Take 3 mLs (2.5 mg total) by nebulization every 4 (four) hours as needed for wheezing or shortness of breath., Disp: 150 mL, Rfl: 1 .  amLODipine (NORVASC) 10 MG tablet, Take 1 tablet (10 mg total) by mouth daily., Disp: 30 tablet, Rfl: 0 .  hydrALAZINE (APRESOLINE) 25 MG tablet, Take 1 tablet (25 mg total) by mouth 2 (two) times daily., Disp: 60 tablet, Rfl: 0 .  hydrochlorothiazide (HYDRODIURIL) 25 MG tablet, Take 1 tablet (25 mg total) by mouth daily., Disp: 30 tablet, Rfl: 0 .  Multiple Vitamins-Calcium (ONE-A-DAY WOMENS FORMULA PO), Take 1 tablet by mouth daily., Disp: , Rfl:  .  omeprazole (PRILOSEC) 20 MG capsule, Take  1 capsule (20 mg total) by mouth daily., Disp: 30 capsule, Rfl: 3 .  valACYclovir (VALTREX) 1000 MG tablet, Take 1,000 mg by mouth daily. , Disp: , Rfl:  Allergies  Allergen Reactions  . Reglan [Metoclopramide] Shortness Of Breath  . Lisinopril Cough  . Losartan Cough  . Sulfa Antibiotics Nausea And Vomiting  . Amoxicillin Rash    Has patient had a PCN reaction causing immediate rash, facial/tongue/throat swelling, SOB or lightheadedness with  hypotension: Yes Has patient had a PCN reaction causing severe rash involving mucus membranes or skin necrosis: Broke out face & buttocks Has patient had a PCN reaction that required hospitalization: No Has patient had a PCN reaction occurring within the last 10 years: No If all of the above answers are "NO", then may proceed with Cephalosporin use  . Contrast Media [Iodinated Diagnostic Agents] Rash    Assessment & Plan:   1. Wheezing   2. Chest pain on breathing    Unclear etiology of symptoms but likely asthma exacerbation. Unfortunately our office currently does not have adequate PPE to see patients with COVID-19 symptoms in our office. Discussed this with patient and she is agreeable to going to urgent care for formal evaluation.  No orders of the defined types were placed in this encounter.  No orders of the defined types were placed in this encounter.   Inda Coke, [place] 10/04/2019  Time spent with the patient: 6 minutes, spent in obtaining information about her symptoms, reviewing her previous labs, evaluations, and treatments, counseling her about her condition (please see the discussed topics above), and developing a plan to further investigate it; she had a number of questions which I addressed.

## 2019-10-04 NOTE — Therapy (Signed)
[place], [place], [address] Phone: [phone]   Fax:  [phone]  Physical Therapy Treatment  Patient Details  Name: Suzanne [name] MRN: [id number] Date of Birth: 1976/05/02 Referring Provider (PT): Lynne Leader, MD   Encounter Date: 10/04/2019  PT End of Session - 10/04/19 1202    Visit Number  6    Number of Visits  8    Date for PT Re-Evaluation  10/11/19    PT Start Time  1100    PT Stop Time  1150    PT Time Calculation (min)  50 min    Activity Tolerance  Patient limited by pain    Behavior During Therapy  [facility] for tasks assessed/performed       Past Medical History:  Diagnosis Date  . Abnormal Pap smear of cervix   . Allergy   . Anemia   . Anxiety   . Arthritis   . Asthma   . Asthma   . Chest pain   . Chronic lower back pain   . Depression   . Fibroid   . GERD (gastroesophageal reflux disease)   . Hypertension   . Migraine   . Migraines   . Neck pain, chronic   . STD (sexually transmitted disease)    HSV1, in eye    Past Surgical History:  Procedure Laterality Date  . COLPOSCOPY    . ESOPHAGEAL DILATION    . ESOPHAGEAL MANOMETRY N/A 04/01/2013   Procedure: ESOPHAGEAL MANOMETRY (EM);  Surgeon: Milus Banister, MD;  Location: [facility];  Service: Endoscopy;  Laterality: N/A;  . ESOPHAGOGASTRODUODENOSCOPY (EGD) WITH ESOPHAGEAL DILATION N/A 02/28/2013   Procedure: ESOPHAGOGASTRODUODENOSCOPY (EGD) WITH ESOPHAGEAL DILATION;  Surgeon: Milus Banister, MD;  Location: [facility];  Service: Endoscopy;  Laterality: N/A;  . TUBAL LIGATION    . VAGINAL HYSTERECTOMY      There were no vitals filed for this visit.  Subjective Assessment - 10/04/19 1152    Subjective  Pt reports that she is very sore in LB today and points to lumbar/sacral area. Pt also reports an ED visit this morning for exacerbation of asthma; states she is feeling much better now and would like to continue  with PT today.    Currently in Pain?  Yes    Pain Score  6     Pain Location  Back                       [facility] Adult PT Treatment/Exercise - 10/04/19 0001      Lumbar Exercises: Standing   Other Standing Lumbar Exercises  standing extension over exercise ball    Other Standing Lumbar Exercises  mini squat with exercise ball 5x2      Lumbar Exercises: Seated   Other Seated Lumbar Exercises  PPT and ab set on exercise ball      Lumbar Exercises: Supine   Other Supine Lumbar Exercises  MET for L posterior innominate in supine x5      Moist Heat Therapy   Number Minutes Moist Heat  15 Minutes    Moist Heat Location  Lumbar Spine;Hip      Electrical Stimulation   Electrical Stimulation Location  LB    Electrical Stimulation Action  IFC    Electrical Stimulation Parameters  sitting    Electrical Stimulation Goals  Pain      Manual Therapy   Manual Therapy  Soft  tissue mobilization    Soft tissue mobilization  STM to L hip/glute/piriformis             PT Education - 10/04/19 1201    Education Details  Pt educated on use of MET for L post innominate    Person(s) Educated  Patient    Methods  Explanation;Demonstration;Handout    Comprehension  Verbalized understanding;Returned demonstration       PT Short Term Goals - 09/27/19 1045      PT SHORT TERM GOAL #1   Title  Pt will be I and compliant with initial HEP.    Status  Partially Met        PT Long Term Goals - 09/13/19 1212      PT LONG TERM GOAL #1   Title  Pt will report decrease in pain with L hip mobility to </= 4/10.    Baseline  7+/10    Time  4    Period  Weeks    Status  New    Target Date  10/11/19      PT LONG TERM GOAL #2   Title  Pt will demonstrate 4/5 or greater hip MMT, in order to avoid falls.    Time  4    Period  Weeks    Status  New    Target Date  10/11/19      PT LONG TERM GOAL #3   Title  Pt will demonstrate understanding of advanced HEP with progressions for  long term maintenance.    Time  4    Period  Weeks    Status  New    Target Date  10/11/19            Plan - 10/04/19 1202    Clinical Impression Statement  Pt limited today d/t breathing difficulties; pt presented to ED this morning with complaints of SOB from asthma exacerbation. Pt states SOB has resolved and wanted to continue session. Limited ex's and provided frequent rest breaks today. Pt demonstrated L posterior innominate; corrected with MET and pt provided with handout for self-MET. Reassess next rx if indicated.    PT Treatment/Interventions  ADLs/Self Care Home Management;Iontophoresis '4mg'$ /ml Dexamethasone;Moist Heat;Traction;Electrical Stimulation;Cryotherapy;Therapeutic exercise;Therapeutic activities;Stair training;Functional mobility training;Patient/family education;Taping;Vasopneumatic Device;Spinal Manipulations;Joint Manipulations;Gait training;Balance training;Neuromuscular re-education;Manual techniques;Dry needling;Passive range of motion    PT Next Visit Plan  progress HEP and possible try stretching but pt has difficulty lying supine       Patient will benefit from skilled therapeutic intervention in order to improve the following deficits and impairments:  Decreased activity tolerance, Impaired flexibility, Impaired perceived functional ability, Hypomobility, Decreased strength, Decreased range of motion, Postural dysfunction, Improper body mechanics, Pain, Impaired sensation, Increased fascial restricitons  Visit Diagnosis: Chronic low back pain with sciatica, sciatica laterality unspecified, unspecified back pain laterality  Pain in left hip  Muscle weakness (generalized)  Pain in right hip  Abnormal posture     Problem List Patient Active Problem List   Diagnosis Date Noted  . Iron deficiency anemia 10/01/2018  . Thalassemia 10/01/2018  . Galactorrhea 07/19/2018  . Malnutrition of moderate degree ([place]) 07/19/2018  . Abnormal TSH 07/19/2018  .  Nummular keratitis of right eye, followed by [facility] 07/19/2018  . Aortic atherosclerosis ([place]) 07/19/2018  . Murmur, cardiac 03/26/2018  . Former smoker, stopped 2019 03/26/2018  . History of hysterectomy 11/25/2016  . Unintentional weight loss 08/17/2016  . B12 deficiency 08/12/2016  . Daily nausea 08/06/2016  . Microcytic anemia 08/06/2016  .  Chronic diarrhea 08/06/2016  . Intestinal malabsorption 08/06/2016  . Lung nodule < 6cm on CT 08/06/2016  . Essential hypertension, on Norvasec, Hydralazine, and HCTZ; patient ACE/ARB intolerant 2/2 cough 07/02/2013  . Atypical chest pain 07/02/2013  . Shortness of breath 07/02/2013  . Other and unspecified ovarian cyst 04/01/2013  . Dysphagia 12/22/2011   Amador Cunas, PT, DPT [place] 10/04/2019, 12:05 PM  [place] [address] [place], [place], [address] Phone: [phone]   Fax:  [phone]  Name: Suzanne [name] MRN: [id number] Date of Birth: 01-Sep-1975

## 2019-10-07 ENCOUNTER — Encounter: Payer: 59 | Admitting: Physical Therapy

## 2019-10-08 ENCOUNTER — Ambulatory Visit: Payer: 59 | Admitting: Physical Therapy

## 2019-10-08 ENCOUNTER — Other Ambulatory Visit: Payer: Self-pay

## 2019-10-08 DIAGNOSIS — M5440 Lumbago with sciatica, unspecified side: Secondary | ICD-10-CM

## 2019-10-08 DIAGNOSIS — G8929 Other chronic pain: Secondary | ICD-10-CM

## 2019-10-08 DIAGNOSIS — M25552 Pain in left hip: Secondary | ICD-10-CM

## 2019-10-08 DIAGNOSIS — M25551 Pain in right hip: Secondary | ICD-10-CM

## 2019-10-08 DIAGNOSIS — M6281 Muscle weakness (generalized): Secondary | ICD-10-CM

## 2019-10-08 NOTE — Therapy (Addendum)
[place], [place], [address] Phone: [phone]   Fax:  [phone]  Physical Therapy Treatment  Patient Details  Name: Suzanne [name] MRN: [id number] Date of Birth: 1975/07/27 Referring Provider (PT): Suzanne Leader, MD   Encounter Date: 10/08/2019  PT End of Session - 10/08/19 1653     Visit Number  7    Date for PT Re-Evaluation  10/11/19    PT Start Time  1620    PT Stop Time  1655    PT Time Calculation (min)  35 min        Past Medical History:  Diagnosis Date   Abnormal Pap smear of cervix    Allergy    Anemia    Anxiety    Arthritis    Asthma    Asthma    Chest pain    Chronic lower back pain    Depression    Fibroid    GERD (gastroesophageal reflux disease)    Hypertension    Migraine    Migraines    Neck pain, chronic    STD (sexually transmitted disease)    HSV1, in eye    Past Surgical History:  Procedure Laterality Date   COLPOSCOPY     ESOPHAGEAL DILATION     ESOPHAGEAL MANOMETRY N/A 04/01/2013   Procedure: ESOPHAGEAL MANOMETRY (EM);  Surgeon: Suzanne Banister, MD;  Location: [facility];  Service: Endoscopy;  Laterality: N/A;   ESOPHAGOGASTRODUODENOSCOPY (EGD) WITH ESOPHAGEAL DILATION N/A 02/28/2013   Procedure: ESOPHAGOGASTRODUODENOSCOPY (EGD) WITH ESOPHAGEAL DILATION;  Surgeon: Suzanne Banister, MD;  Location: [facility];  Service: Endoscopy;  Laterality: N/A;   TUBAL LIGATION     VAGINAL HYSTERECTOMY      There were no vitals filed for this visit.  Subjective Assessment - 10/08/19 1621     Subjective  " I have not had that real bad pain but I have taken a fair amount of flexirol"    Currently in Pain?  Yes    Pain Score  6     Pain Location  Back                        [facility] Adult PT Treatment/Exercise - 10/08/19 0001       Lumbar Exercises: Aerobic   Nustep  L 4 12mn      Lumbar Exercises: Machines for Strengthening   Cybex  Lumbar Extension  black tband 2 sets 10    Other Lumbar Machine Exercise  row 20# 2 sets 10      Lumbar Exercises: Supine   Bridge with Ball Squeeze  10 reps;3 seconds    Other Supine Lumbar Exercises  feet on ball bridge, KTC and obl 15 times   isometric abdominals 15 times   Other Supine Lumbar Exercises  clamshell and hip flex red tband      Manual Therapy   Manual therapy comments  chceked SI alignment =,     Soft tissue mobilization  ITB- unable to tolerate, very painful    Passive ROM  hips- limited tolerance                PT Short Term Goals - 09/27/19 1045       PT SHORT TERM GOAL #1   Title  Pt will be I and compliant with initial HEP.    Status  Partially Met  PT Long Term Goals - 09/13/19 1212       PT LONG TERM GOAL #1   Title  Pt will report decrease in pain with L hip mobility to </= 4/10.    Baseline  7+/10    Time  4    Period  Weeks    Status  New    Target Date  10/11/19      PT LONG TERM GOAL #2   Title  Pt will demonstrate 4/5 or greater hip MMT, in order to avoid falls.    Time  4    Period  Weeks    Status  New    Target Date  10/11/19      PT LONG TERM GOAL #3   Title  Pt will demonstrate understanding of advanced HEP with progressions for long term maintenance.    Time  4    Period  Weeks    Status  New    Target Date  10/11/19             Plan - 10/08/19 1654     Clinical Impression Statement  pt tolerated ther ex fair. limited tolerance to ROM and STW. very tender in BIL GT/ITB Left>RT. SI in alignment. When asked if PT was helping pt verb she did not think so, stated sleeps with heating pad and has to slep on pillos d/t tenderness n hips/buttock. pt does verb walking fo rex.    PT Next Visit Plan  pt to call MD in the morning to get f/u appt and possibly be placed on hold as PT is not helping. Pt to call after schedules to see how long it will take to get in for appt.        Patient will benefit from  skilled therapeutic intervention in order to improve the following deficits and impairments:  Decreased activity tolerance, Impaired flexibility, Impaired perceived functional ability, Hypomobility, Decreased strength, Decreased range of motion, Postural dysfunction, Improper body mechanics, Pain, Impaired sensation, Increased fascial restricitons  Visit Diagnosis: Pain in left hip  Chronic low back pain with sciatica, sciatica laterality unspecified, unspecified back pain laterality  Muscle weakness (generalized)  Pain in right hip     Problem List Patient Active Problem List   Diagnosis Date Noted   Iron deficiency anemia 10/01/2018   Thalassemia 10/01/2018   Galactorrhea 07/19/2018   Malnutrition of moderate degree ([place]) 07/19/2018   Abnormal TSH 07/19/2018   Nummular keratitis of right eye, 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/2018   Aortic atherosclerosis ([place]) 07/19/2018   Murmur, cardiac 03/26/2018   Former smoker, stopped 2019 03/26/2018   History of hysterectomy 11/25/2016   Unintentional weight loss 08/17/2016   B12 deficiency 08/12/2016   Daily nausea 08/06/2016   Microcytic anemia 08/06/2016   Chronic diarrhea 08/06/2016   Intestinal malabsorption 08/06/2016   Lung nodule < 6cm on CT 08/06/2016   Essential hypertension, on Norvasec, Hydralazine, and HCTZ; patient ACE/ARB intolerant 2/2 cough 07/02/2013   Atypical chest pain 07/02/2013   Shortness of breath 07/02/2013   Other and unspecified ovarian cyst 04/01/2013   Dysphagia 12/22/2011    Suzanne [name],Suzanne [name] PTA 10/08/2019, 4:59 PM  Suzanne [name], [place], [address] Phone: [phone]   Fax:  [phone]  Name: Suzanne [name] MRN: [id number] Date of Birth: [birth year]   PHYSICAL THERAPY DISCHARGE SUMMARY  Visits from Start of Care:7 Plan: Patient agrees to discharge.  Patient goals were partially met.  Patient is being discharged due to -  put on hold, pt did not return after visit 7 .      Suzanne [name], PT, DPT 12:18 PM  06/07/22 Signing therapist doing d/c only, did not see/treat patient.

## 2019-10-10 ENCOUNTER — Encounter: Payer: Self-pay | Admitting: Family Medicine

## 2019-10-11 ENCOUNTER — Encounter: Payer: 59 | Admitting: Physical Therapy

## 2019-10-16 ENCOUNTER — Encounter: Payer: 59 | Admitting: Physical Therapy

## 2019-10-22 ENCOUNTER — Emergency Department (HOSPITAL_COMMUNITY): Payer: 59

## 2019-10-22 ENCOUNTER — Emergency Department (HOSPITAL_COMMUNITY)
Admission: EM | Admit: 2019-10-22 | Discharge: 2019-10-22 | Disposition: A | Discharge status: Home / Self Care | Payer: 59 | Attending: Emergency Medicine | Admitting: Emergency Medicine

## 2019-10-22 ENCOUNTER — Other Ambulatory Visit: Payer: Self-pay

## 2019-10-22 ENCOUNTER — Encounter (HOSPITAL_COMMUNITY): Payer: Self-pay | Admitting: Emergency Medicine

## 2019-10-22 DIAGNOSIS — Z87891 Personal history of nicotine dependence: Secondary | ICD-10-CM | POA: Diagnosis not present

## 2019-10-22 DIAGNOSIS — I10 Essential (primary) hypertension: Secondary | ICD-10-CM | POA: Insufficient documentation

## 2019-10-22 DIAGNOSIS — J45901 Unspecified asthma with (acute) exacerbation: Secondary | ICD-10-CM | POA: Insufficient documentation

## 2019-10-22 DIAGNOSIS — Z79899 Other long term (current) drug therapy: Secondary | ICD-10-CM | POA: Insufficient documentation

## 2019-10-22 DIAGNOSIS — Z20822 Contact with and (suspected) exposure to covid-19: Secondary | ICD-10-CM | POA: Insufficient documentation

## 2019-10-22 DIAGNOSIS — G500 Trigeminal neuralgia: Secondary | ICD-10-CM | POA: Insufficient documentation

## 2019-10-22 DIAGNOSIS — R0789 Other chest pain: Secondary | ICD-10-CM | POA: Diagnosis present

## 2019-10-22 LAB — CBC
HCT: 41.5 % (ref 36.0–46.0)
Hemoglobin: 12.9 g/dL (ref 12.0–15.0)
MCH: 23.6 pg — ABNORMAL LOW (ref 26.0–34.0)
MCHC: 31.1 g/dL (ref 30.0–36.0)
MCV: 76 fL — ABNORMAL LOW (ref 80.0–100.0)
Platelets: 250 10*3/uL (ref 150–400)
RBC: 5.46 MIL/uL — ABNORMAL HIGH (ref 3.87–5.11)
RDW: 15.6 % — ABNORMAL HIGH (ref 11.5–15.5)
WBC: 6.8 10*3/uL (ref 4.0–10.5)
nRBC: 0 % (ref 0.0–0.2)

## 2019-10-22 LAB — BASIC METABOLIC PANEL
Anion gap: 7 (ref 5–15)
BUN: 19 mg/dL (ref 6–20)
CO2: 37 mmol/L — ABNORMAL HIGH (ref 22–32)
Calcium: 9.9 mg/dL (ref 8.9–10.3)
Chloride: 97 mmol/L — ABNORMAL LOW (ref 98–111)
Creatinine, Ser: 0.72 mg/dL (ref 0.44–1.00)
GFR calc Af Amer: >60 mL/min (ref >60)
GFR calc non Af Amer: >60 mL/min (ref >60)
Glucose, Bld: 95 mg/dL (ref 70–99)
Potassium: 3.8 mmol/L (ref 3.5–5.1)
Sodium: 141 mmol/L (ref 135–145)

## 2019-10-22 LAB — TROPONIN I (HIGH SENSITIVITY)
Troponin I (High Sensitivity): 5 ng/L (ref <18)
Troponin I (High Sensitivity): 5 ng/L (ref <18)

## 2019-10-22 LAB — I-STAT BETA HCG BLOOD, ED (MC, WL, AP ONLY): I-stat hCG, quantitative: <5 m[IU]/mL (ref <5)

## 2019-10-22 LAB — SARS CORONAVIRUS 2 BY RT PCR (HOSPITAL ORDER, PERFORMED IN ~~LOC~~ HOSPITAL LAB): SARS Coronavirus 2: NEGATIVE

## 2019-10-22 MED ORDER — DIPHENHYDRAMINE HCL 50 MG/ML IJ SOLN
25.0000 mg | Freq: Once | INTRAMUSCULAR | Status: AC
  Administered 2019-10-22: 25 mg via INTRAVENOUS
  Filled 2019-10-22: qty 1

## 2019-10-22 MED ORDER — PROCHLORPERAZINE EDISYLATE 10 MG/2ML IJ SOLN
10.0000 mg | Freq: Once | INTRAMUSCULAR | Status: AC
  Administered 2019-10-22: 10 mg via INTRAVENOUS
  Filled 2019-10-22: qty 2

## 2019-10-22 MED ORDER — SODIUM CHLORIDE 0.9% FLUSH
3.0000 mL | Freq: Once | INTRAVENOUS | Status: AC
  Administered 2019-10-22: 3 mL via INTRAVENOUS

## 2019-10-22 MED ORDER — ALBUTEROL SULFATE HFA 108 (90 BASE) MCG/ACT IN AERS
8.0000 | INHALATION_SPRAY | Freq: Once | RESPIRATORY_TRACT | Status: AC
  Administered 2019-10-22: 8 via RESPIRATORY_TRACT
  Filled 2019-10-22: qty 6.7

## 2019-10-22 MED ORDER — CARBAMAZEPINE 200 MG PO TABS
200.0000 mg | ORAL_TABLET | Freq: Every day | ORAL | 0 refills | Status: DC
Start: 2019-10-22 — End: 2022-12-20

## 2019-10-22 NOTE — ED Provider Notes (Signed)
[facility] DEPT Provider Note   CSN: [id number] Arrival date & time: 10/22/19  [id number]     History Chief Complaint  Patient presents with   Chest Pain   Shortness of Breath    Suzanne [name] is a 44 y.o. female.  HPI 44 year old female with a history of asthma, anxiety, hypertension, migraines, GERD presents to the ER for chest pain, shortness of breath and headaches that started last night.  Per chart review, patient was seen in the urgent care on 10/04/2019 for worsening cough, wheezing and shortness of breath, was given an albuterol nebulizer solution, prednisone, Tessalon, Zyrtec, Flonase.  Patient states that the albuterol helps her mildly, but she started to have headaches and chest pain yesterday.  Called urgent care, they told her to come to the ER.  She states that she feels like she is wheezing, and she has chest tightness over her chest.  Headache starts in the bottom of her neck and travels over her head.  Patient states that her headache starts at the back of her neck and travels over her head and behind her left eye.  Denies any vision changes.  She has taken Excedrin Migraine without relief.  States she had one episode of nonbloody, nonbilious vomiting secondary to the headache.  Denies any fevers or chills, neck stiffness, rhinorrhea.  She is not on any blood thinners, no recent falls, no dizziness, syncope.  No recent Covid exposures.  No loss of taste or smell.  No recent surgeries or travel.  She is not on OCPs.  No unilateral leg swelling.    Past Medical History:  Diagnosis Date   Abnormal Pap smear of cervix    Allergy    Anemia    Anxiety    Arthritis    Asthma    Asthma    Chest pain    Chronic lower back pain    Depression    Fibroid    GERD (gastroesophageal reflux disease)    Hypertension    Migraine    Migraines    Neck pain, chronic    STD (sexually transmitted disease)    HSV1, in eye    Patient  Active Problem List   Diagnosis Date Noted   Iron deficiency anemia 10/01/2018   Thalassemia 10/01/2018   Galactorrhea 07/19/2018   Malnutrition of moderate degree ([place]) 07/19/2018   Abnormal TSH 07/19/2018   Nummular keratitis of right eye, 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/2018   Aortic atherosclerosis ([place]) 07/19/2018   Murmur, cardiac 03/26/2018   Former smoker, stopped 2019 03/26/2018   History of hysterectomy 11/25/2016   Unintentional weight loss 08/17/2016   B12 deficiency 08/12/2016   Daily nausea 08/06/2016   Microcytic anemia 08/06/2016   Chronic diarrhea 08/06/2016   Intestinal malabsorption 08/06/2016   Lung nodule < 6cm on CT 08/06/2016   Essential hypertension, on Norvasec, Hydralazine, and HCTZ; patient ACE/ARB intolerant 2/2 cough 07/02/2013   Atypical chest pain 07/02/2013   Shortness of breath 07/02/2013   Other and unspecified ovarian cyst 04/01/2013   Dysphagia 12/22/2011    Past Surgical History:  Procedure Laterality Date   COLPOSCOPY     ESOPHAGEAL DILATION     ESOPHAGEAL MANOMETRY N/A 04/01/2013   Procedure: ESOPHAGEAL MANOMETRY (EM);  Surgeon: Milus Banister, MD;  Location: [facility];  Service: Endoscopy;  Laterality: N/A;   ESOPHAGOGASTRODUODENOSCOPY (EGD) WITH ESOPHAGEAL DILATION N/A 02/28/2013   Procedure: ESOPHAGOGASTRODUODENOSCOPY (EGD) WITH ESOPHAGEAL DILATION;  Surgeon: Milus Banister, MD;  Location:  [facility];  Service: Endoscopy;  Laterality: N/A;   TUBAL LIGATION     VAGINAL HYSTERECTOMY       OB History    Gravida  3   Para  2   Term      Preterm  2   AB  1   Living  1     SAB      TAB  1   Ectopic      Multiple      Live Births  2           Family History  Problem Relation Age of Onset   Heart disease Father        H/O CABG   Hypertension Mother    Hypertension Maternal Grandmother    Thyroid disease Maternal Grandmother    Diabetes Maternal Grandmother    Hypertension  Paternal Grandmother    Diabetes Paternal Grandmother    Cancer - Lung Paternal Grandmother    Hypertension Maternal Aunt    Thyroid disease Maternal Aunt     Social History   Tobacco Use   Smoking status: Former Smoker    Packs/day: 0.10    Years: 10.00    Pack years: 1.00    Types: Cigars    Quit date: 12/06/2017    Years since quitting: 1.8   Smokeless tobacco: Never Used   Tobacco comment: smokes 2 Black & Milds per day 08/19/13  Substance Use Topics   Alcohol use: Not Currently   Drug use: Not Currently    Home Medications Prior to Admission medications   Medication Sig Start Date End Date Taking? Authorizing Provider  albuterol (PROVENTIL) (2.5 MG/3ML) 0.083% nebulizer solution Take 3 mLs (2.5 mg total) by nebulization every 4 (four) hours as needed for wheezing or shortness of breath. 10/04/19  Yes Hall-Potvin, [place], PA-C  amLODipine (NORVASC) 10 MG tablet Take 1 tablet (10 mg total) by mouth daily. 07/31/19  Yes Inda Coke, PA  benzonatate (TESSALON) 100 MG capsule Take 1 capsule (100 mg total) by mouth every 8 (eight) hours. 10/04/19  Yes Hall-Potvin, [place], PA-C  cetirizine (ZYRTEC ALLERGY) 10 MG tablet Take 1 tablet (10 mg total) by mouth daily. 10/04/19  Yes Hall-Potvin, [place], PA-C  fluticasone (FLONASE) 50 MCG/ACT nasal spray Place 1 spray into both nostrils daily. 10/04/19  Yes Hall-Potvin, [place], PA-C  hydrALAZINE (APRESOLINE) 25 MG tablet Take 1 tablet (25 mg total) by mouth 2 (two) times daily. 07/31/19  Yes Inda Coke, PA  hydrochlorothiazide (HYDRODIURIL) 25 MG tablet Take 1 tablet (25 mg total) by mouth daily. 07/31/19  Yes Inda Coke, PA  Multiple Vitamins-Calcium (ONE-A-DAY WOMENS FORMULA PO) Take 1 tablet by mouth daily.   Yes [provider]  omeprazole (PRILOSEC) 20 MG capsule Take 1 capsule (20 mg total) by mouth daily. 11/03/17  Yes Briscoe Deutscher, DO  valACYclovir (VALTREX) 1000 MG tablet Take 1,000 mg by mouth daily.     Yes [provider]  carbamazepine (TEGRETOL) 200 MG tablet Take 1 tablet (200 mg total) by mouth daily. 10/22/19   Garald Balding, PA-C  predniSONE (DELTASONE) 20 MG tablet Take 1 tablet (20 mg total) by mouth daily. Patient not taking: Reported on 10/22/2019 10/04/19   Hall-Potvin, [place], PA-C    Allergies    Reglan [metoclopramide], Lisinopril, Losartan, Sulfa antibiotics, Amoxicillin, and Contrast media [iodinated diagnostic agents]  Review of Systems   Review of Systems  Constitutional: Positive for fatigue. Negative for chills and fever.  HENT: Negative for  congestion, rhinorrhea, sinus pressure and sore throat.   Respiratory: Positive for cough, shortness of breath and wheezing.   Cardiovascular: Positive for chest pain. Negative for palpitations and leg swelling.  Gastrointestinal: Negative for abdominal pain, constipation, diarrhea, nausea and vomiting.  Genitourinary: Negative for dysuria and hematuria.  Musculoskeletal: Positive for neck pain. Negative for back pain, myalgias and neck stiffness.  Skin: Negative for rash.  Neurological: Positive for headaches. Negative for dizziness, tremors, syncope, speech difficulty, weakness and numbness.  Psychiatric/Behavioral: Negative for confusion.    Physical Exam Updated Vital Signs BP 119/87    Pulse 68    Temp 98.1 F (36.7 C) (Oral)    Resp 18    SpO2 100%   Physical Exam Vitals and nursing note reviewed.  Constitutional:      General: She is not in acute distress.    Appearance: Normal appearance. She is well-developed. She is not ill-appearing, toxic-appearing or diaphoretic.  HENT:     Head: Normocephalic and atraumatic.     Nose: Nose normal.     Mouth/Throat:     Mouth: Mucous membranes are moist.     Pharynx: Oropharynx is clear.  Eyes:     Conjunctiva/sclera: Conjunctivae normal.     Pupils: Pupils are equal, round, and reactive to light.  Neck:     Comments: No midline C-spine  tenderness Cardiovascular:     Rate and Rhythm: Normal rate and regular rhythm.     Pulses: Normal pulses.     Heart sounds: Normal heart sounds. No murmur.  Pulmonary:     Effort: Pulmonary effort is normal. No respiratory distress.     Breath sounds: Normal breath sounds. No wheezing, rhonchi or rales.  Chest:     Chest wall: Tenderness present.  Abdominal:     General: Abdomen is flat.     Palpations: Abdomen is soft.     Tenderness: There is no abdominal tenderness.  Musculoskeletal:        General: Tenderness present. Normal range of motion.     Cervical back: Normal range of motion and neck supple. No rigidity. Muscular tenderness present. No spinous process tenderness. Normal range of motion.     Comments: Bilateral C-spine paraspinal tenderness.  Full range of motion of neck without difficulty  Lymphadenopathy:     Cervical: No cervical adenopathy.  Skin:    General: Skin is warm and dry.     Capillary Refill: Capillary refill takes less than 2 seconds.     Findings: No erythema or rash.  Neurological:     General: No focal deficit present.     Mental Status: She is alert and oriented to person, place, and time.     Cranial Nerves: No cranial nerve deficit.     Sensory: No sensory deficit.     Motor: No weakness.     Comments: Negative Brudzinski's.  Mental Status:  Alert, thought content appropriate, able to give a coherent history. Speech fluent without evidence of aphasia. Able to follow 2 step commands without difficulty.  Cranial Nerves:  II: Peripheral visual fields grossly normal, pupils equal, round, reactive to light III,IV, VI: ptosis not present, extra-ocular motions intact bilaterally  V,VII: smile symmetric, facial light touch sensation equal VIII: hearing grossly normal to voice  X: uvula elevates symmetrically  XI: bilateral shoulder shrug symmetric and strong XII: midline tongue extension without fassiculations Motor:  Normal tone. 5/5 strength of  BUE and BLE major muscle groups including strong and equal grip strength  and dorsiflexion/plantar flexion Sensory: light touch normal in all extremities. Cerebellar: normal finger-to-nose with bilateral upper extremities, Romberg sign absent Gait: not acessed  Psychiatric:        Mood and Affect: Mood normal.        Behavior: Behavior normal.     ED Results / Procedures / Treatments   Labs (all labs ordered are listed, but only abnormal results are displayed) Labs Reviewed  BASIC METABOLIC PANEL - Abnormal; Notable for the following components:      Result Value   Chloride 97 (*)    CO2 37 (*)    All other components within normal limits  CBC - Abnormal; Notable for the following components:   RBC 5.46 (*)    MCV 76.0 (*)    MCH 23.6 (*)    RDW 15.6 (*)    All other components within normal limits  SARS CORONAVIRUS 2 BY RT PCR (HOSPITAL ORDER, [facility])  SARS CORONAVIRUS 2 (TAT 6-24 HRS)  I-STAT BETA HCG BLOOD, ED ([facility], [facility], [facility] ONLY)  TROPONIN I (HIGH SENSITIVITY)  TROPONIN I (HIGH SENSITIVITY)    EKG EKG Interpretation  Date/Time:  Tuesday Oct 22 2019 09:36:46 EDT Ventricular Rate:  78 PR Interval:    QRS Duration: 81 QT Interval:  386 QTC Calculation: 440 R Axis:   84 Text Interpretation: Sinus rhythm 12 Lead; Mason-Likar Baseline wander T wave abnormality Abnormal ECG Confirmed by Carmin Muskrat [phone]) on 10/22/2019 10:43:47 AM   Radiology DG Chest 2 View  Result Date: 10/22/2019 CLINICAL DATA:  Cough and chest pain EXAM: CHEST - 2 VIEW COMPARISON:  December 11, 2017 FINDINGS: Lungs are clear. Heart size and pulmonary vascularity are normal. No adenopathy. No pneumothorax. No bone lesions. IMPRESSION: Lungs clear.  Cardiac silhouette within normal limits. Electronically Signed   By: Lowella Grip III M.D.   On: 10/22/2019 10:01    Procedures Procedures (including critical care time)  Medications Ordered in ED Medications  sodium  chloride flush (NS) 0.9 % injection 3 mL (3 mLs Intravenous Given 10/22/19 1105)  prochlorperazine (COMPAZINE) injection 10 mg (10 mg Intravenous Given 10/22/19 1138)  diphenhydrAMINE (BENADRYL) injection 25 mg (25 mg Intravenous Given 10/22/19 1138)  albuterol (VENTOLIN HFA) 108 (90 Base) MCG/ACT inhaler 8 puff (8 puffs Inhalation Given 10/22/19 1319)    ED Course  I have reviewed the triage vital signs and the nursing notes.  Pertinent labs & imaging results that were available during my care of the patient were reviewed by me and considered in my medical decision making (see chart for details).    MDM Rules/Calculators/A&P                     44 year old with chest pain, shortness of breath, and headaches over there last week. On presentation to the ER, the patient is alert and oriented, no acute distress, nontoxic appearing, without increased work of breathing.  She is holding her head and refers to me that she does have a headache.  No focal neuro deficits, normal strength in upper and lower extremities.  No abdominal tenderness.  Lungs clear, no evidence of wheezing, rales, rhonchi accessory muscle usage.  She does have reproducible chest wall tenderness.  Vitals overall reassuring, 100% O2 sats, not tachycardic or tachypneic.  She PERC negative.  Heart score of 2.  CBC without leukocytosis, BMP without significant electrolyte abnormalities, normal renal function.  CO2 of 37 which is elevated from her previous labs,  however I do think that this may be a faulty result as the patient is not tachypneic, her sats have been consistently 100% throughout the ER stay.  Chest x-ray without acute cardiopulmonary abnormalities.  EKG normal sinus rhythm.  Initial troponin negative.  Patient treated with migraine cocktail in the ER. Heart score of 2.  2-hour Covid swab pending.  1:12 PM: 2-hour Covid negative.  On reexamination, patient states that she still feels chest tightness and her headache is unimproved  after migraine cocktail. Refused Covid PCR.  Suspect trigeminal neuralgia.  Offered patient occipital nerve block, which she refused at this time.  We discussed starting carbamazepine today in the ER, and following up with her PCP within the week.  Patient does note improvement in her chest tightness after 8 puffs of albuterol.  I encouraged her to continue taking the albuterol as needed.  Given reproducible chest wall tenderness, encouraged ibuprofen and Tylenol.  PAtient voices understanding and is agreeable to this plan.  Return precautions given.  At this stage in ED course the patient has been medically screened and is stable for discharge.  I discussed the case with Dr. Vanita Panda who is agreeable to the above plan.  Final Clinical Impression(s) / ED Diagnoses Final diagnoses:  Trigeminal neuralgia  Mild asthma with exacerbation, unspecified whether persistent    Rx / DC Orders ED Discharge Orders         Ordered    carbamazepine (TEGRETOL) 200 MG tablet  Daily     10/22/19 1404           Lyndel Safe 10/22/19 1448    Carmin Muskrat, MD 10/23/19 1101

## 2019-10-22 NOTE — ED Triage Notes (Signed)
Patient here from home with complaints of chest pain, SOB, and headache that started last night. Denies COVID exposure.

## 2019-10-22 NOTE — Discharge Instructions (Addendum)
Please start the prescribed medication today.  Start by taking 200 mg once a day.  You may increase your dose up to 400 mg if you do not see any relief in the next few days.  Please make sure to follow-up with your primary care doctor within the week.  Continue to use your albuterol inhaler.  Return to the ER if your symptoms worsen.

## 2019-10-22 NOTE — ED Notes (Signed)
Patient refused 2nd Covid swab, Lelan Pons, [place] made aware.

## 2019-10-23 ENCOUNTER — Other Ambulatory Visit: Payer: Self-pay | Admitting: *Deleted

## 2019-10-23 ENCOUNTER — Telehealth: Payer: Self-pay | Admitting: Oncology

## 2019-10-23 DIAGNOSIS — D508 Other iron deficiency anemias: Secondary | ICD-10-CM

## 2019-10-23 NOTE — Telephone Encounter (Signed)
Scheduled appt per 5/26 sch message- pt is aware of appt daet and time

## 2019-11-04 ENCOUNTER — Encounter: Payer: Self-pay | Admitting: Physician Assistant

## 2019-11-04 ENCOUNTER — Other Ambulatory Visit: Payer: Self-pay

## 2019-11-04 ENCOUNTER — Ambulatory Visit (INDEPENDENT_AMBULATORY_CARE_PROVIDER_SITE_OTHER): Payer: 59

## 2019-11-04 ENCOUNTER — Ambulatory Visit
Admission: EM | Admit: 2019-11-04 | Discharge: 2019-11-04 | Disposition: A | Discharge status: Home / Self Care | Payer: 59 | Attending: Physician Assistant | Admitting: Physician Assistant

## 2019-11-04 DIAGNOSIS — M79642 Pain in left hand: Secondary | ICD-10-CM

## 2019-11-04 MED ORDER — DICLOFENAC SODIUM 50 MG PO TBEC
50.0000 mg | DELAYED_RELEASE_TABLET | Freq: Two times a day (BID) | ORAL | 0 refills | Status: DC
Start: 2019-11-04 — End: 2022-12-24

## 2019-11-04 NOTE — ED Provider Notes (Signed)
[name]    CSN: [id number] Arrival date & time: 11/04/19  1605      History   Chief Complaint Chief Complaint  Patient presents with  . Hand Pain    HPI Suzanne [name] is a 44 y.o. female.   44 year old female comes in for left hand pain after injury earlier today. Hand got caught in the tray on an airplane causing pain and swelling to the left hand. Decreased ROM due to pain. No numbness/tingling.      Past Medical History:  Diagnosis Date  . Abnormal Pap smear of cervix   . Allergy   . Anemia   . Anxiety   . Arthritis   . Asthma   . Asthma   . Chest pain   . Chronic lower back pain   . Depression   . Fibroid   . GERD (gastroesophageal reflux disease)   . Hypertension   . Migraine   . Migraines   . Neck pain, chronic   . STD (sexually transmitted disease)    HSV1, in eye    Patient Active Problem List   Diagnosis Date Noted  . Iron deficiency anemia 10/01/2018  . Thalassemia 10/01/2018  . Galactorrhea 07/19/2018  . Malnutrition of moderate degree ([place]) 07/19/2018  . Abnormal TSH 07/19/2018  . Nummular keratitis of right eye, followed by [facility] 07/19/2018  . Aortic atherosclerosis ([place]) 07/19/2018  . Murmur, cardiac 03/26/2018  . Former smoker, stopped 2019 03/26/2018  . History of hysterectomy 11/25/2016  . Unintentional weight loss 08/17/2016  . B12 deficiency 08/12/2016  . Daily nausea 08/06/2016  . Microcytic anemia 08/06/2016  . Chronic diarrhea 08/06/2016  . Intestinal malabsorption 08/06/2016  . Lung nodule < 6cm on CT 08/06/2016  . Essential hypertension, on Norvasec, Hydralazine, and HCTZ; patient ACE/ARB intolerant 2/2 cough 07/02/2013  . Atypical chest pain 07/02/2013  . Shortness of breath 07/02/2013  . Other and unspecified ovarian cyst 04/01/2013  . Dysphagia 12/22/2011    Past Surgical History:  Procedure Laterality Date  . COLPOSCOPY    . ESOPHAGEAL DILATION    . ESOPHAGEAL MANOMETRY N/A 04/01/2013   Procedure:  ESOPHAGEAL MANOMETRY (EM);  Surgeon: Milus Banister, MD;  Location: [facility];  Service: Endoscopy;  Laterality: N/A;  . ESOPHAGOGASTRODUODENOSCOPY (EGD) WITH ESOPHAGEAL DILATION N/A 02/28/2013   Procedure: ESOPHAGOGASTRODUODENOSCOPY (EGD) WITH ESOPHAGEAL DILATION;  Surgeon: Milus Banister, MD;  [facility];  Service: Endoscopy;  Laterality: N/A;  . TUBAL LIGATION    . VAGINAL HYSTERECTOMY      OB History    Gravida  3   Para  2   Term      Preterm  2   AB  1   Living  1     SAB      TAB  1   Ectopic      Multiple      Live Births  2            Home Medications    Prior to Admission medications   Medication Sig Start Date End Date Taking? Authorizing Provider  albuterol (PROVENTIL) (2.5 MG/3ML) 0.083% nebulizer solution Take 3 mLs (2.5 mg total) by nebulization every 4 (four) hours as needed for wheezing or shortness of breath. 10/04/19   Hall-Potvin, [place], PA-C  amLODipine (NORVASC) 10 MG tablet Take 1 tablet (10 mg total) by mouth daily. 07/31/19   Inda Coke, PA  carbamazepine (TEGRETOL) 200 MG tablet Take 1 tablet (200 mg total) by  mouth daily. 10/22/19   Garald Balding, PA-C  cetirizine (ZYRTEC ALLERGY) 10 MG tablet Take 1 tablet (10 mg total) by mouth daily. 10/04/19   Hall-Potvin, [place], PA-C  diclofenac (VOLTAREN) 50 MG EC tablet Take 1 tablet (50 mg total) by mouth 2 (two) times daily. 11/04/19   Tasia Catchings, Letonia Stead V, PA-C  hydrALAZINE (APRESOLINE) 25 MG tablet Take 1 tablet (25 mg total) by mouth 2 (two) times daily. 07/31/19   Inda Coke, PA  hydrochlorothiazide (HYDRODIURIL) 25 MG tablet Take 1 tablet (25 mg total) by mouth daily. 07/31/19   Inda Coke, PA  Multiple Vitamins-Calcium (ONE-A-DAY WOMENS FORMULA PO) Take 1 tablet by mouth daily.    [provider]  omeprazole (PRILOSEC) 20 MG capsule Take 1 capsule (20 mg total) by mouth daily. 11/03/17   Briscoe Deutscher, DO  valACYclovir (VALTREX) 1000 MG tablet Take 1,000 mg by mouth  daily.     [provider]  fluticasone (FLONASE) 50 MCG/ACT nasal spray Place 1 spray into both nostrils daily. 10/04/19 11/04/19  Hall-Potvin, [place], PA-C    Family History Family History  Problem Relation Age of Onset  . Heart disease Father        H/O CABG  . Hypertension Mother   . Hypertension Maternal Grandmother   . Thyroid disease Maternal Grandmother   . Diabetes Maternal Grandmother   . Hypertension Paternal Grandmother   . Diabetes Paternal Grandmother   . Cancer - Lung Paternal Grandmother   . Hypertension Maternal Aunt   . Thyroid disease Maternal Aunt     Social History Social History   Tobacco Use  . Smoking status: Former Smoker    Packs/day: 0.10    Years: 10.00    Pack years: 1.00    Types: Cigars    Quit date: 12/06/2017    Years since quitting: 1.9  . Smokeless tobacco: Never Used  . Tobacco comment: smokes 2 Black & Milds per day 08/19/13  Substance Use Topics  . Alcohol use: Not Currently  . Drug use: Not Currently     Allergies   Reglan [metoclopramide], Lisinopril, Losartan, Sulfa antibiotics, Amoxicillin, and Contrast media [iodinated diagnostic agents]   Review of Systems Review of Systems  Reason unable to perform ROS: See HPI as above.     Physical Exam Triage Vital Signs ED Triage Vitals  Enc Vitals Group     BP 11/04/19 1623 132/85     Pulse Rate 11/04/19 1623 88     Resp 11/04/19 1623 18     Temp 11/04/19 1623 98.3 F (36.8 C)     Temp Source 11/04/19 1623 Oral     SpO2 11/04/19 1623 99 %     Weight --      Height --      Head Circumference --      Peak Flow --      Pain Score 11/04/19 1711 10     Pain Loc --      Pain Edu? --      Excl. in [place]? --    No data found.  Updated Vital Signs BP 132/85 (BP Location: Right Arm)   Pulse 88   Temp 98.3 F (36.8 C) (Oral)   Resp 18   SpO2 99%   Physical Exam Constitutional:      General: She is not in acute distress.    Appearance: Normal appearance. She is  well-developed. She is not toxic-appearing or diaphoretic.  HENT:     Head: Normocephalic and  atraumatic.  Eyes:     Conjunctiva/sclera: Conjunctivae normal.     Pupils: Pupils are equal, round, and reactive to light.  Pulmonary:     Effort: Pulmonary effort is normal. No respiratory distress.     Comments: Speaking in full sentences without difficulty Musculoskeletal:     Cervical back: Normal range of motion and neck supple.     Comments: Swelling to dorsal aspect of hand without contusion, erythema, warmth.  Diffuse tenderness to palpation of distal 2nd-4th MCP and MCP joint.  No tenderness to palpation of wrist/fingers.  Full range of motion of wrist, decreased flexion of MCP joint due to swelling.  Strength deferred.  Sensation intact. Radial pulse 2+  Skin:    General: Skin is warm and dry.  Neurological:     Mental Status: She is alert and oriented to person, place, and time.      UC Treatments / Results  Labs (all labs ordered are listed, but only abnormal results are displayed) Labs Reviewed - No data to display  EKG   Radiology DG Hand Complete Left  Result Date: 11/04/2019 CLINICAL DATA:  Pain and swelling after injury EXAM: LEFT HAND - COMPLETE 3+ VIEW COMPARISON:  None. FINDINGS: Frontal, oblique, and lateral views were obtained. There is soft tissue swelling dorsally. No fracture or dislocation. Joint spaces appear normal. No erosive change. IMPRESSION: Soft tissue swelling dorsally. No fracture or dislocation. No appreciable arthropathy. Electronically Signed   By: Lowella Grip III M.D.   On: 11/04/2019 17:33    Procedures Procedures (including critical care time)  Medications Ordered in UC Medications - No data to display  Initial Impression / Assessment and Plan / UC Course  I have reviewed the triage vital signs and the nursing notes.  Pertinent labs & imaging results that were available during my care of the patient were reviewed by me and considered  in my medical decision making (see chart for details).    X-ray negative for fracture or dislocation.  NSAIDs, ice compress, elevation, rest.  Return precautions given.  Patient expresses understanding and agrees to plan.  Final Clinical Impressions(s) / UC Diagnoses   Final diagnoses:  Left hand pain   ED Prescriptions    Medication Sig Dispense Auth. Provider   diclofenac (VOLTAREN) 50 MG EC tablet Take 1 tablet (50 mg total) by mouth 2 (two) times daily. 20 tablet Ok Edwards, PA-C     PDMP not reviewed this encounter.   Ok Edwards, PA-C 11/04/19 1809

## 2019-11-04 NOTE — ED Triage Notes (Signed)
Patient seen by amy yu, pa prior to this nurse.

## 2019-11-04 NOTE — Discharge Instructions (Signed)
X-ray negative for fracture or dislocation. Start diclofenac. Do not take ibuprofen (motrin/advil)/ naproxen (aleve) while on diclofenac.  Ice compress, elevation, rest.  This may take a few weeks to completely resolve, but should be feeling better each week.  Follow-up with PCP for further evaluation if symptoms not improving.

## 2019-11-18 ENCOUNTER — Encounter: Payer: Self-pay | Admitting: Adult Health

## 2019-11-18 ENCOUNTER — Inpatient Hospital Stay: Payer: 59 | Attending: Adult Health | Admitting: Adult Health

## 2019-11-18 ENCOUNTER — Ambulatory Visit (HOSPITAL_COMMUNITY)
Admission: RE | Admit: 2019-11-18 | Discharge: 2019-11-18 | Disposition: A | Discharge status: Home / Self Care | Payer: 59 | Source: Ambulatory Visit | Attending: Adult Health | Admitting: Adult Health

## 2019-11-18 ENCOUNTER — Other Ambulatory Visit: Payer: Self-pay

## 2019-11-18 ENCOUNTER — Inpatient Hospital Stay: Payer: 59

## 2019-11-18 ENCOUNTER — Telehealth: Payer: Self-pay | Admitting: Oncology

## 2019-11-18 VITALS — BP 165/86 | HR 86 | Temp 98.7°F | Resp 18 | Ht 64.0 in | Wt 164.4 lb

## 2019-11-18 DIAGNOSIS — R059 Cough, unspecified: Secondary | ICD-10-CM

## 2019-11-18 DIAGNOSIS — Z79899 Other long term (current) drug therapy: Secondary | ICD-10-CM | POA: Diagnosis not present

## 2019-11-18 DIAGNOSIS — Z833 Family history of diabetes mellitus: Secondary | ICD-10-CM | POA: Diagnosis not present

## 2019-11-18 DIAGNOSIS — Z8249 Family history of ischemic heart disease and other diseases of the circulatory system: Secondary | ICD-10-CM | POA: Insufficient documentation

## 2019-11-18 DIAGNOSIS — Z888 Allergy status to other drugs, medicaments and biological substances status: Secondary | ICD-10-CM | POA: Insufficient documentation

## 2019-11-18 DIAGNOSIS — I10 Essential (primary) hypertension: Secondary | ICD-10-CM | POA: Diagnosis not present

## 2019-11-18 DIAGNOSIS — Z6828 Body mass index (BMI) 28.0-28.9, adult: Secondary | ICD-10-CM | POA: Diagnosis not present

## 2019-11-18 DIAGNOSIS — D569 Thalassemia, unspecified: Secondary | ICD-10-CM | POA: Diagnosis not present

## 2019-11-18 DIAGNOSIS — Z87891 Personal history of nicotine dependence: Secondary | ICD-10-CM | POA: Diagnosis not present

## 2019-11-18 DIAGNOSIS — D509 Iron deficiency anemia, unspecified: Secondary | ICD-10-CM | POA: Insufficient documentation

## 2019-11-18 DIAGNOSIS — R079 Chest pain, unspecified: Secondary | ICD-10-CM | POA: Diagnosis not present

## 2019-11-18 DIAGNOSIS — Z8349 Family history of other endocrine, nutritional and metabolic diseases: Secondary | ICD-10-CM | POA: Insufficient documentation

## 2019-11-18 DIAGNOSIS — R519 Headache, unspecified: Secondary | ICD-10-CM | POA: Diagnosis not present

## 2019-11-18 DIAGNOSIS — Z880 Allergy status to penicillin: Secondary | ICD-10-CM | POA: Insufficient documentation

## 2019-11-18 DIAGNOSIS — R634 Abnormal weight loss: Secondary | ICD-10-CM | POA: Diagnosis not present

## 2019-11-18 DIAGNOSIS — D508 Other iron deficiency anemias: Secondary | ICD-10-CM

## 2019-11-18 DIAGNOSIS — R5383 Other fatigue: Secondary | ICD-10-CM | POA: Diagnosis not present

## 2019-11-18 DIAGNOSIS — R05 Cough: Secondary | ICD-10-CM | POA: Insufficient documentation

## 2019-11-18 DIAGNOSIS — Z882 Allergy status to sulfonamides status: Secondary | ICD-10-CM | POA: Diagnosis not present

## 2019-11-18 DIAGNOSIS — D563 Thalassemia minor: Secondary | ICD-10-CM

## 2019-11-18 DIAGNOSIS — Z801 Family history of malignant neoplasm of trachea, bronchus and lung: Secondary | ICD-10-CM | POA: Diagnosis not present

## 2019-11-18 LAB — CBC WITH DIFFERENTIAL (CANCER CENTER ONLY)
Abs Immature Granulocytes: 0.07 10*3/uL (ref 0.00–0.07)
Basophils Absolute: 0 10*3/uL (ref 0.0–0.1)
Basophils Relative: 0 %
Eosinophils Absolute: 0 10*3/uL (ref 0.0–0.5)
Eosinophils Relative: 0 %
HCT: 35.5 % — ABNORMAL LOW (ref 36.0–46.0)
Hemoglobin: 11.3 g/dL — ABNORMAL LOW (ref 12.0–15.0)
Immature Granulocytes: 1 %
Lymphocytes Relative: 10 %
Lymphs Abs: 1.1 10*3/uL (ref 0.7–4.0)
MCH: 24.1 pg — ABNORMAL LOW (ref 26.0–34.0)
MCHC: 31.8 g/dL (ref 30.0–36.0)
MCV: 75.7 fL — ABNORMAL LOW (ref 80.0–100.0)
Monocytes Absolute: 0.4 10*3/uL (ref 0.1–1.0)
Monocytes Relative: 4 %
Neutro Abs: 9.7 10*3/uL — ABNORMAL HIGH (ref 1.7–7.7)
Neutrophils Relative %: 85 %
Platelet Count: 307 10*3/uL (ref 150–400)
RBC: 4.69 MIL/uL (ref 3.87–5.11)
RDW: 15.9 % — ABNORMAL HIGH (ref 11.5–15.5)
WBC Count: 11.3 10*3/uL — ABNORMAL HIGH (ref 4.0–10.5)
nRBC: 0 % (ref 0.0–0.2)

## 2019-11-18 LAB — COMPREHENSIVE METABOLIC PANEL
ALT: 19 U/L (ref 0–44)
AST: 15 U/L (ref 15–41)
Albumin: 3.8 g/dL (ref 3.5–5.0)
Alkaline Phosphatase: 70 U/L (ref 38–126)
Anion gap: 10 (ref 5–15)
BUN: 19 mg/dL (ref 6–20)
CO2: 24 mmol/L (ref 22–32)
Calcium: 9.9 mg/dL (ref 8.9–10.3)
Chloride: 107 mmol/L (ref 98–111)
Creatinine, Ser: 0.8 mg/dL (ref 0.44–1.00)
GFR calc Af Amer: >60 mL/min (ref >60)
GFR calc non Af Amer: >60 mL/min (ref >60)
Glucose, Bld: 113 mg/dL — ABNORMAL HIGH (ref 70–99)
Potassium: 4.7 mmol/L (ref 3.5–5.1)
Sodium: 141 mmol/L (ref 135–145)
Total Bilirubin: <0.2 mg/dL — ABNORMAL LOW (ref 0.3–1.2)
Total Protein: 7.5 g/dL (ref 6.5–8.1)

## 2019-11-18 LAB — RETICULOCYTES
Immature Retic Fract: 22.6 % — ABNORMAL HIGH (ref 2.3–15.9)
RBC.: 4.72 MIL/uL (ref 3.87–5.11)
Retic Count, Absolute: 93 10*3/uL (ref 19.0–186.0)
Retic Ct Pct: 2 % (ref 0.4–3.1)

## 2019-11-18 LAB — IRON AND TIBC
Iron: 41 ug/dL (ref 41–142)
Saturation Ratios: 15 % — ABNORMAL LOW (ref 21–57)
TIBC: 281 ug/dL (ref 236–444)
UIBC: 240 ug/dL (ref 120–384)

## 2019-11-18 LAB — FERRITIN: Ferritin: 190 ng/mL (ref 11–307)

## 2019-11-18 NOTE — Progress Notes (Signed)
Page  [phone] [phone]     ID: Suzanne [name] DOB: Jul 02, 1975  MR#: [id number]  VOH#:[id number]  Patient Care Team: [facility] as PCP - General (Family Medicine) Magrinat, Virgie Dad, MD as Consulting Physician (Hematology and Oncology) Sena Slate, MD as Referring Physician (Ophthalmology) Erlene Quan, PA-C as Physician Assistant (Cardiology) Alda Berthold, DO as Consulting Physician (Neurology) OTHER MD:   CHIEF COMPLAINT: Microcytic anemia  CURRENT TREATMENT: Observation  INTERVAL HISTORY: Philamena returns today for follow up of her microcytic anemia.  She is doing well today.  She notes that she has not been feeling incredibly well lately.  She has had a cough, night sweats at night, hot flashes during the day.  She has been fatigued and is having an increase in headaches.   She is using her albuterol nebulizer more frequently as well.     REVIEW OF SYSTEMS: Jannet is establishing with a new PCP today, as her previous one left the clinic.  She has ran out of her anti hypertensive medications, and her blood pressure is elevated today.  She was also recently flying back from [facility] when her hand was accidentally closed in a tray.  She is in increased pain from this, and is taking Diclofenac bid.  She is otherwise doing well and has no fever, chills, chest pain, palpitations, bowel/bladder changes.  She is no longer having cycles.  She notes that she is overdue for a mammogram.   HISTORY OF CURRENT ILLNESS: Suzanne [name] is referred today for evaluation of microcytic anemia with unintentional weight loss.  As far as the weight loss is concerned the patient weighed 217 pounds 08/15/2013.  Her weight dropped to 157 by 12/24/2015.  Her weight dropped further to 144 pounds as of 08/12/2016, with the lowest reading on record being 136.6 on 11/25/2016.  Since that time she has been gaining weight  so that today her weight is 160 pounds.  We have lab work on her dating back to 09/15/2010.  At that time her hemoglobin was 11.7 and her MCV 71.7.  Platelets were 259 and white cells slightly elevated at 10.9.  In subsequent years the hemoglobin has been as low as 9.6, and as high as 13.6 (post transfusion?) with most values in the 11.0, 0.5 range.  Specifically the hemoglobin has varied widely as follows:  Results for Suzanne [name] (MRN [id number]) as of 08/07/2018 10:33  Ref. Range 02/26/2013 16:35 07/19/2013 11:38 07/21/2014 03:12 07/18/2015 16:47 05/19/2016 08:04 08/03/2016 11:00 11/25/2016 11:28 11/03/2017 14:14 12/11/2017 11:34 04/02/2018 16:23 05/12/2018 03:23 07/18/2018 09:51 08/07/2018 09:41  Hemoglobin Latest Ref Range: 12.0 - 15.0 g/dL 11.6 (L) 11.0 (L) 9.7 (L) 13.6 11.5 (L) 10.8 (L) 11.4 (L) 11.0 (L) 12.0 10.7 (L) 12.7 9.7 (L) 9.6 (L)   No MCV has been higher than 78.2, with most readings in the low 70s.  She takes iron supplementation daily, and tells me she has not missed any doses.  She does not have significant problems from the oral iron.  Ferritin has been in the low normal range:  Results for Suzanne [name] (MRN [id number]) as of 08/07/2018 10:33  Ref. Range 12/04/2017 10:02 07/18/2018 09:51  Ferritin Latest Ref Range: 16 - 232 ng/mL 50 61    The patient's subsequent history is as detailed below.   PAST MEDICAL HISTORY: Past Medical History:  Diagnosis Date  . Abnormal Pap smear of cervix   . Allergy   .  Anemia   . Anxiety   . Arthritis   . Asthma   . Asthma   . Chest pain   . Chronic lower back pain   . Depression   . Fibroid   . GERD (gastroesophageal reflux disease)   . Hypertension   . Migraine   . Migraines   . Neck pain, chronic   . STD (sexually transmitted disease)    HSV1, in eye    PAST SURGICAL HISTORY: Past Surgical History:  Procedure Laterality Date  . COLPOSCOPY    . ESOPHAGEAL DILATION    . ESOPHAGEAL MANOMETRY N/A 04/01/2013   Procedure:  ESOPHAGEAL MANOMETRY (EM);  Surgeon: Milus Banister, MD;  Location: [facility];  Service: Endoscopy;  Laterality: N/A;  . ESOPHAGOGASTRODUODENOSCOPY (EGD) WITH ESOPHAGEAL DILATION N/A 02/28/2013   Procedure: ESOPHAGOGASTRODUODENOSCOPY (EGD) WITH ESOPHAGEAL DILATION;  Surgeon: Milus Banister, MD;  Location: [facility];  Service: Endoscopy;  Laterality: N/A;  . TUBAL LIGATION    . VAGINAL HYSTERECTOMY       FAMILY HISTORY: Family History  Problem Relation Age of Onset  . Heart disease Father        H/O CABG  . Hypertension Mother   . Hypertension Maternal Grandmother   . Thyroid disease Maternal Grandmother   . Diabetes Maternal Grandmother   . Hypertension Paternal Grandmother   . Diabetes Paternal Grandmother   . Cancer - Lung Paternal Grandmother   . Hypertension Maternal Aunt   . Thyroid disease Maternal Aunt    Rachyl's father died age 26. Patients' mother died at age 59. The patient has 3 brothers and 4 sisters. Patient denies anyone in her family having breast, ovarian, prostate, or pancreatic cancer. Jaselyn notes that her paternal grandmother was diagnosed with lung cancer. She also notes that her grandmother was prescribed blood thinners.    GYNECOLOGIC HISTORY:  No LMP recorded. Patient has had a hysterectomy. 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 Her husband, Retha Bither, is a meat cutter. Marykate daughter is 41, works at [employer], and attends school full time. Christina lost a son at the age of 14 to a house fire in 08/2017. Shelma does not attend a church, Glass blower/designer, or mosque.    ADVANCED DIRECTIVES: Jazzmyne's husband, Jakaria Lavergne, is automatically her healthcare power of attorney.     HEALTH MAINTENANCE: Social History   Tobacco Use  . Smoking status: Former Smoker     Packs/day: 0.10    Years: 10.00    Pack years: 1.00    Types: Cigars    Quit date: 12/06/2017    Years since quitting: 1.9  . Smokeless tobacco: Never Used  . Tobacco comment: smokes 2 Black & Milds per day 08/19/13  Vaping Use  . Vaping Use: Never used  Substance Use Topics  . Alcohol use: Not Currently  . Drug use: Not Currently    Colonoscopy: n/a  PAP: referral to GYN in process  Bone density: n/a   Allergies  Allergen Reactions  . Reglan [Metoclopramide] Shortness Of Breath  . Lisinopril Cough  . Losartan Cough  . Sulfa Antibiotics Nausea And Vomiting  . Amoxicillin Rash    Has patient had a PCN reaction causing immediate rash, facial/tongue/throat swelling, SOB or lightheadedness with hypotension: Yes Has patient had a PCN reaction causing severe rash involving mucus membranes  or skin necrosis: Broke out face & buttocks Has patient had a PCN reaction that required hospitalization: No Has patient had a PCN reaction occurring within the last 10 years: No If all of the above answers are "NO", then may proceed with Cephalosporin use  . Contrast Media [Iodinated Diagnostic Agents] Rash    Current Outpatient Medications  Medication Sig Dispense Refill  . albuterol (PROVENTIL) (2.5 MG/3ML) 0.083% nebulizer solution Take 3 mLs (2.5 mg total) by nebulization every 4 (four) hours as needed for wheezing or shortness of breath. 75 mL 0  . amLODipine (NORVASC) 10 MG tablet Take 1 tablet (10 mg total) by mouth daily. 30 tablet 0  . carbamazepine (TEGRETOL) 200 MG tablet Take 1 tablet (200 mg total) by mouth daily. 14 tablet 0  . cetirizine (ZYRTEC ALLERGY) 10 MG tablet Take 1 tablet (10 mg total) by mouth daily. 30 tablet 0  . diclofenac (VOLTAREN) 50 MG EC tablet Take 1 tablet (50 mg total) by mouth 2 (two) times daily. 20 tablet 0  . hydrALAZINE (APRESOLINE) 25 MG tablet Take 1 tablet (25 mg total) by mouth 2 (two) times daily. 60 tablet 0  . hydrochlorothiazide (HYDRODIURIL) 25  MG tablet Take 1 tablet (25 mg total) by mouth daily. 30 tablet 0  . Multiple Vitamins-Calcium (ONE-A-DAY WOMENS FORMULA PO) Take 1 tablet by mouth daily.    Marland Kitchen omeprazole (PRILOSEC) 20 MG capsule Take 1 capsule (20 mg total) by mouth daily. 30 capsule 3  . valACYclovir (VALTREX) 1000 MG tablet Take 1,000 mg by mouth daily.      No current facility-administered medications for this visit.     OBJECTIVE:   Vitals:   11/18/19 0935 11/18/19 0940  BP: (!) 180/96 (!) 165/86  Pulse: 86   Resp: 18   Temp: 98.7 F (37.1 C)   SpO2: 100%      Body mass index is 28.22 kg/m.   Wt Readings from Last 3 Encounters:  11/18/19 164 lb 6.4 oz (74.6 kg)  08/19/19 160 lb 12.8 oz (72.9 kg)  08/05/19 169 lb 3.2 oz (76.7 kg)      ECOG FS:1 - Symptomatic but completely ambulatory  GENERAL: Patient is a well appearing female in no acute distress HEENT:  Sclerae anicteric.mask in place Neck is supple.  NODES:  No cervical, supraclavicular, or axillary lymphadenopathy palpated.  BREAST EXAM:  Deferred. LUNGS:  Clear to auscultation bilaterally.  No wheezes or rhonchi. HEART:  Regular rate and rhythm. No murmur appreciated. ABDOMEN:  Soft, nontender.  Positive, normoactive bowel sounds. No organomegaly palpated. MSK:  No focal spinal tenderness to palpation. Full range of motion bilaterally in the upper extremities. EXTREMITIES:  No peripheral edema.   SKIN:  Clear with no obvious rashes or skin changes. No nail dyscrasia. NEURO:  Nonfocal. Well oriented.  Appropriate affect.    LAB RESULTS:  CMP     Component Value Date/Time   NA 141 11/18/2019 0908   K 4.7 11/18/2019 0908   CL 107 11/18/2019 0908   CO2 24 11/18/2019 0908   GLUCOSE 113 (H) 11/18/2019 0908   BUN 19 11/18/2019 0908   CREATININE 0.80 11/18/2019 0908   CREATININE 0.79 08/07/2018 0941   CREATININE 0.58 07/19/2013 1138   CALCIUM 9.9 11/18/2019 0908   PROT 7.5 11/18/2019 0908   ALBUMIN 3.8 11/18/2019 0908   AST 15 11/18/2019  0908   AST 22 08/07/2018 0941   ALT 19 11/18/2019 0908   ALT 19 08/07/2018 0941   ALKPHOS  70 11/18/2019 0908   BILITOT <0.2 (L) 11/18/2019 0908   BILITOT 0.3 08/07/2018 0941   GFRNONAA >60 11/18/2019 0908   GFRNONAA >60 08/07/2018 0941   GFRAA >60 11/18/2019 0908   GFRAA >60 08/07/2018 0941    No results found for: TOTALPROTELP, ALBUMINELP, A1GS, A2GS, BETS, BETA2SER, GAMS, MSPIKE, SPEI  No results found for: KPAFRELGTCHN, LAMBDASER, KAPLAMBRATIO  Lab Results  Component Value Date   WBC 11.3 (H) 11/18/2019   NEUTROABS 9.7 (H) 11/18/2019   HGB 11.3 (L) 11/18/2019   HCT 35.5 (L) 11/18/2019   MCV 75.7 (L) 11/18/2019   PLT 307 11/18/2019    @LASTCHEMISTRY @  No results found for: LABCA2  No components found for: ZOXWRU045  No results for input(s): INR in the last 168 hours.  No results found for: LABCA2  No results found for: WUJ811  No results found for: BJY782  No results found for: NFA213  No results found for: CA2729  No components found for: HGQUANT  No results found for: CEA1 / No results found for: CEA1   No results found for: AFPTUMOR  No results found for: CHROMOGRNA  No results found for: PSA1  Appointment on 11/18/2019  Component Date Value Ref Range Status  . Retic Ct Pct 11/18/2019 2.0  0.4 - 3.1 % Final  . RBC. 11/18/2019 4.72  3.87 - 5.11 MIL/uL Final  . Retic Count, Absolute 11/18/2019 93.0  19.0 - 186.0 K/uL Final  . Immature Retic Fract 11/18/2019 22.6* 2.3 - 15.9 % Final   Performed at [facility], [place] [address] [place], [place] [address]  . WBC Count 11/18/2019 11.3* 4.0 - 10.5 K/uL Final  . RBC 11/18/2019 4.69  3.87 - 5.11 MIL/uL Final  . Hemoglobin 11/18/2019 11.3* 12.0 - 15.0 g/dL Final  . HCT 11/18/2019 35.5* 36 - 46 % Final  . MCV 11/18/2019 75.7* 80.0 - 100.0 fL Final  . MCH 11/18/2019 24.1* 26.0 - 34.0 pg Final  . MCHC 11/18/2019 31.8  30.0 - 36.0 g/dL Final  . RDW 11/18/2019 15.9* 11.5 - 15.5 %  Final  . Platelet Count 11/18/2019 307  150 - 400 K/uL Final  . nRBC 11/18/2019 0.0  0.0 - 0.2 % Final  . Neutrophils Relative % 11/18/2019 85  % Final  . Neutro Abs 11/18/2019 9.7* 1.7 - 7.7 K/uL Final  . Lymphocytes Relative 11/18/2019 10  % Final  . Lymphs Abs 11/18/2019 1.1  0.7 - 4.0 K/uL Final  . Monocytes Relative 11/18/2019 4  % Final  . Monocytes Absolute 11/18/2019 0.4  0 - 1 K/uL Final  . Eosinophils Relative 11/18/2019 0  % Final  . Eosinophils Absolute 11/18/2019 0.0  0 - 0 K/uL Final  . Basophils Relative 11/18/2019 0  % Final  . Basophils Absolute 11/18/2019 0.0  0 - 0 K/uL Final  . Immature Granulocytes 11/18/2019 1  % Final  . Abs Immature Granulocytes 11/18/2019 0.07  0.00 - 0.07 K/uL Final   Performed at [facility], [place] [address] [place],  [address]  . Sodium 11/18/2019 141  135 - 145 mmol/L Final  . Potassium 11/18/2019 4.7  3.5 - 5.1 mmol/L Final  . Chloride 11/18/2019 107  98 - 111 mmol/L Final  . CO2 11/18/2019 24  22 - 32 mmol/L Final  . Glucose, Bld 11/18/2019 113* 70 - 99 mg/dL Final   Glucose reference range applies only to samples taken after fasting for at least 8 hours.  Marland Kitchen  BUN 11/18/2019 19  6 - 20 mg/dL Final  . Creatinine, Ser 11/18/2019 0.80  0.44 - 1.00 mg/dL Final  . Calcium 11/18/2019 9.9  8.9 - 10.3 mg/dL Final  . Total Protein 11/18/2019 7.5  6.5 - 8.1 g/dL Final  . Albumin 11/18/2019 3.8  3.5 - 5.0 g/dL Final  . AST 11/18/2019 15  15 - 41 U/L Final  . ALT 11/18/2019 19  0 - 44 U/L Final  . Alkaline Phosphatase 11/18/2019 70  38 - 126 U/L Final  . Total Bilirubin 11/18/2019 <0.2* 0.3 - 1.2 mg/dL Final  . GFR calc non Af Amer 11/18/2019 >60  >60 mL/min Final  . GFR calc Af Amer 11/18/2019 >60  >60 mL/min Final  . Anion gap 11/18/2019 10  5 - 15 Final   Performed at [facility], [place] [address], [place], [place] [address]    (this displays the last labs from the last 3 days)  No  results found for: TOTALPROTELP, ALBUMINELP, A1GS, A2GS, BETS, BETA2SER, GAMS, MSPIKE, SPEI (this displays SPEP labs)  No results found for: KPAFRELGTCHN, LAMBDASER, KAPLAMBRATIO (kappa/lambda light chains)  Lab Results  Component Value Date   HGBA 97.9 10/18/2018   HGBA2QUANT 2.1 10/18/2018   HGBFQUANT 0.0 10/18/2018   HGBSQUAN 0.0 10/18/2018   (Hemoglobinopathy evaluation)   No results found for: LDH  Lab Results  Component Value Date   IRON 90 01/21/2019   TIBC 169 (L) 01/21/2019   IRONPCTSAT 54 01/21/2019   (Iron and TIBC)  Lab Results  Component Value Date   FERRITIN 311 (H) 01/21/2019    Urinalysis    Component Value Date/Time   COLORURINE STRAW (A) 05/12/2018 0503   APPEARANCEUR CLEAR 05/12/2018 0503   LABSPEC 1.006 05/12/2018 0503   PHURINE 7.0 05/12/2018 0503   GLUCOSEU NEGATIVE 05/12/2018 0503   HGBUR NEGATIVE 05/12/2018 0503   BILIRUBINUR negative 04/26/2019 1215   BILIRUBINUR Negative 11/22/2018 1639   KETONESUR negative 04/26/2019 1215   KETONESUR NEGATIVE 05/12/2018 0503   PROTEINUR negative 04/26/2019 1215   PROTEINUR Negative 11/22/2018 1639   PROTEINUR NEGATIVE 05/12/2018 0503   UROBILINOGEN 0.2 04/26/2019 1215   UROBILINOGEN 0.2 02/19/2014 1213   NITRITE Negative 04/26/2019 1215   NITRITE Negative 11/22/2018 1639   NITRITE NEGATIVE 05/12/2018 0503   LEUKOCYTESUR Negative 04/26/2019 1215   STUDIES:  DG Chest 2 View  Result Date: 10/22/2019 CLINICAL DATA:  Cough and chest pain EXAM: CHEST - 2 VIEW COMPARISON:  December 11, 2017 FINDINGS: Lungs are clear. Heart size and pulmonary vascularity are normal. No adenopathy. No pneumothorax. No bone lesions. IMPRESSION: Lungs clear.  Cardiac silhouette within normal limits. Electronically Signed   By: Lowella Grip III M.D.   On: 10/22/2019 10:01   DG Hand Complete Left  Result Date: 11/04/2019 CLINICAL DATA:  Pain and swelling after injury EXAM: LEFT HAND - COMPLETE 3+ VIEW COMPARISON:  None.  FINDINGS: Frontal, oblique, and lateral views were obtained. There is soft tissue swelling dorsally. No fracture or dislocation. Joint spaces appear normal. No erosive change. IMPRESSION: Soft tissue swelling dorsally. No fracture or dislocation. No appreciable arthropathy. Electronically Signed   By: Lowella Grip III M.D.   On: 11/04/2019 17:33    ASSESSMENT: 44 y.o. [place], [place] woman with a long history of microcytic anemia, no MCV higher than 78 (even when hemoglobin in normal range), on oral iron supplementation with low- normal ferritin studies.  (1) status post Feraheme infusions 10/18/2018 and 10/29/2018.  (a) ferritin rose  from 30 on 09/17/2018 to 393 on 11/16/2018  (b) globin improved from 9.7 on 07/18/2018 to 11.3 on 01/21/2019  (2) thalassemia: MCV in mid 70's despite normal ferritin  (3) situational depression: venlafaxine started 01/21/2019   PLAN: Mikela is here today for f/u of her anemia.  Her hemoglobin is 11.3 today which is slightly down.  We are awaiting her ferritin and iron studies.  These are pending.  It is possible that she is iron deficient which could explain her increased symptoms.  Her WBC is increased and I wonder if she doesn't have a bronchitis, or something of the sorts brewing.  Her last prednisone taper was in early May.  I ordered a chest xray for this.    She is also out of her anti hypertensives which can explain some of her symptoms.  She is establishing with a new pcp today and they will take over her anti hypertensive medication management.    I recommended that she get a mammogram completed as this is overdue.  I will call toshi once we get her lab results and will let her know if she needs to return for IV iron.Marland Kitchen  She will return in 6 months for lab only and in in one year for labs and f/u.  She knows to call for any questions that may arise between now and her next appointment.  We are happy to see her sooner if needed.   Total  encounter time: 20 minutes*  Wilber Bihari, NP 11/18/19 10:09 AM Medical Oncology and Hematology [facility] [place], [place] [address] Tel. [phone]    Fax. [phone]  *Total Encounter Time as defined by the Centers for Medicare and Medicaid Services includes, in addition to the face-to-face time of a patient visit (documented in the note above) non-face-to-face time: obtaining and reviewing outside history, ordering and reviewing medications, tests or procedures, care coordination (communications with other health care professionals or caregivers) and documentation in the medical record.

## 2019-11-18 NOTE — Telephone Encounter (Signed)
Scheduled appts per 6/21 los. Gave pt a print out of AVS.  

## 2019-11-19 ENCOUNTER — Telehealth: Payer: Self-pay | Admitting: *Deleted

## 2019-11-19 NOTE — Telephone Encounter (Signed)
Notified of message below

## 2019-11-19 NOTE — Telephone Encounter (Signed)
-----   Message from Gardenia Phlegm, NP sent at 11/18/2019  4:49 PM EDT ----- Chest xray is normal.  Good news.  Please let patient know ----- Message ----- From: Interface, Rad Results In Sent: 11/18/2019   4:19 PM EDT To: Gardenia Phlegm, NP

## 2019-11-20 ENCOUNTER — Telehealth: Payer: Self-pay

## 2019-11-20 NOTE — Telephone Encounter (Signed)
Per Wilber Bihari, NP, contacted pt regarding iron studies to inform her that results are stable and she will not need an iron infusion. Pt verbalized thanks and understanding.

## 2019-12-09 DIAGNOSIS — J4540 Moderate persistent asthma, uncomplicated: Secondary | ICD-10-CM | POA: Insufficient documentation

## 2020-01-20 ENCOUNTER — Ambulatory Visit
Admission: EM | Admit: 2020-01-20 | Discharge: 2020-01-20 | Disposition: A | Discharge status: Home / Self Care | Payer: 59 | Attending: Emergency Medicine | Admitting: Emergency Medicine

## 2020-01-20 DIAGNOSIS — Z1152 Encounter for screening for COVID-19: Secondary | ICD-10-CM

## 2020-01-20 NOTE — ED Triage Notes (Signed)
Pt here for covid exposure x 2 days ago; denies sx

## 2020-01-22 LAB — NOVEL CORONAVIRUS, NAA: SARS-CoV-2, NAA: NOT DETECTED

## 2020-01-22 LAB — SARS-COV-2, NAA 2 DAY TAT

## 2020-04-20 ENCOUNTER — Ambulatory Visit (INDEPENDENT_AMBULATORY_CARE_PROVIDER_SITE_OTHER): Payer: 59

## 2020-04-20 ENCOUNTER — Other Ambulatory Visit: Payer: Self-pay

## 2020-04-20 ENCOUNTER — Ambulatory Visit
Admission: EM | Admit: 2020-04-20 | Discharge: 2020-04-20 | Disposition: A | Discharge status: Home / Self Care | Payer: 59 | Attending: Family Medicine | Admitting: Family Medicine

## 2020-04-20 DIAGNOSIS — M25561 Pain in right knee: Secondary | ICD-10-CM

## 2020-04-20 DIAGNOSIS — S83421A Sprain of lateral collateral ligament of right knee, initial encounter: Secondary | ICD-10-CM | POA: Diagnosis not present

## 2020-04-20 DIAGNOSIS — X501XXA Overexertion from prolonged static or awkward postures, initial encounter: Secondary | ICD-10-CM | POA: Diagnosis not present

## 2020-04-20 MED ORDER — HYDROCODONE-ACETAMINOPHEN 5-325 MG PO TABS: 2.0000 | ORAL_TABLET | ORAL | 0 refills | Status: DC | PRN

## 2020-04-20 NOTE — Discharge Instructions (Addendum)
Limit weightbearing Follow-up with orthopedic Take pain medicine as needed

## 2020-04-20 NOTE — ED Triage Notes (Addendum)
Pt is here with right knee pain that started last night after falling, pt has taken Advil and Tylenol to relieve discomfort.

## 2020-04-20 NOTE — ED Provider Notes (Signed)
[name]    CSN: [id number] Arrival date & time: 04/20/20  8413      History   Chief Complaint Chief Complaint  Patient presents with  . Knee Pain    HPI Suzanne [name] is a 44 y.o. female.   Patient fell yesterday now complains of pain on the right leg primarily the knee.  History is not very descriptive but pain is over the lateral aspect  HPI  Past Medical History:  Diagnosis Date  . Abnormal Pap smear of cervix   . Allergy   . Anemia   . Anxiety   . Arthritis   . Asthma   . Asthma   . Chest pain   . Chronic lower back pain   . Depression   . Fibroid   . GERD (gastroesophageal reflux disease)   . Hypertension   . Migraine   . Migraines   . Neck pain, chronic   . STD (sexually transmitted disease)    HSV1, in eye    Patient Active Problem List   Diagnosis Date Noted  . Iron deficiency anemia 10/01/2018  . Thalassemia 10/01/2018  . Galactorrhea 07/19/2018  . Malnutrition of moderate degree ([place]) 07/19/2018  . Abnormal TSH 07/19/2018  . Nummular keratitis of right eye, followed by [facility] 07/19/2018  . Aortic atherosclerosis ([place]) 07/19/2018  . Murmur, cardiac 03/26/2018  . Former smoker, stopped 2019 03/26/2018  . History of hysterectomy 11/25/2016  . Unintentional weight loss 08/17/2016  . B12 deficiency 08/12/2016  . Daily nausea 08/06/2016  . Microcytic anemia 08/06/2016  . Chronic diarrhea 08/06/2016  . Intestinal malabsorption 08/06/2016  . Lung nodule < 6cm on CT 08/06/2016  . Essential hypertension, on Norvasec, Hydralazine, and HCTZ; patient ACE/ARB intolerant 2/2 cough 07/02/2013  . Atypical chest pain 07/02/2013  . Shortness of breath 07/02/2013  . Other and unspecified ovarian cyst 04/01/2013  . Dysphagia 12/22/2011    Past Surgical History:  Procedure Laterality Date  . COLPOSCOPY    . ESOPHAGEAL DILATION    . ESOPHAGEAL MANOMETRY N/A 04/01/2013   Procedure: ESOPHAGEAL MANOMETRY (EM);  Surgeon: Milus Banister, MD;   Location: [facility];  Service: Endoscopy;  Laterality: N/A;  . ESOPHAGOGASTRODUODENOSCOPY (EGD) WITH ESOPHAGEAL DILATION N/A 02/28/2013   Procedure: ESOPHAGOGASTRODUODENOSCOPY (EGD) WITH ESOPHAGEAL DILATION;  Surgeon: Milus Banister, MD;  Location: [facility];  Service: Endoscopy;  Laterality: N/A;  . TUBAL LIGATION    . VAGINAL HYSTERECTOMY      OB History    Gravida  3   Para  2   Term      Preterm  2   AB  1   Living  1     SAB      TAB  1   Ectopic      Multiple      Live Births  2            Home Medications    Prior to Admission medications   Medication Sig Start Date End Date Taking? Authorizing Provider  albuterol (PROVENTIL) (2.5 MG/3ML) 0.083% nebulizer solution Take 3 mLs (2.5 mg total) by nebulization every 4 (four) hours as needed for wheezing or shortness of breath. 10/04/19   Hall-Potvin, [place], PA-C  amLODipine (NORVASC) 10 MG tablet Take 1 tablet (10 mg total) by mouth daily. 07/31/19   Inda Coke, PA  carbamazepine (TEGRETOL) 200 MG tablet Take 1 tablet (200 mg total) by mouth daily. 10/22/19   Garald Balding, PA-C  cetirizine (  ZYRTEC ALLERGY) 10 MG tablet Take 1 tablet (10 mg total) by mouth daily. 10/04/19   Hall-Potvin, [place], PA-C  diclofenac (VOLTAREN) 50 MG EC tablet Take 1 tablet (50 mg total) by mouth 2 (two) times daily. 11/04/19   Tasia Catchings, Amy V, PA-C  hydrALAZINE (APRESOLINE) 25 MG tablet Take 1 tablet (25 mg total) by mouth 2 (two) times daily. 07/31/19   Inda Coke, PA  hydrochlorothiazide (HYDRODIURIL) 25 MG tablet Take 1 tablet (25 mg total) by mouth daily. 07/31/19   Inda Coke, PA  Multiple Vitamins-Calcium (ONE-A-DAY WOMENS FORMULA PO) Take 1 tablet by mouth daily.    [provider]  omeprazole (PRILOSEC) 20 MG capsule Take 1 capsule (20 mg total) by mouth daily. 11/03/17   Briscoe Deutscher, DO  valACYclovir (VALTREX) 1000 MG tablet Take 1,000 mg by mouth daily.     [provider]  fluticasone (FLONASE)  50 MCG/ACT nasal spray Place 1 spray into both nostrils daily. 10/04/19 11/04/19  Hall-Potvin, [place], PA-C    Family History Family History  Problem Relation Age of Onset  . Heart disease Father        H/O CABG  . Hypertension Mother   . Hypertension Maternal Grandmother   . Thyroid disease Maternal Grandmother   . Diabetes Maternal Grandmother   . Hypertension Paternal Grandmother   . Diabetes Paternal Grandmother   . Cancer - Lung Paternal Grandmother   . Hypertension Maternal Aunt   . Thyroid disease Maternal Aunt     Social History Social History   Tobacco Use  . Smoking status: Former Smoker    Packs/day: 0.10    Years: 10.00    Pack years: 1.00    Types: Cigars    Quit date: 12/06/2017    Years since quitting: 2.3  . Smokeless tobacco: Never Used  . Tobacco comment: smokes 2 Black & Milds per day 08/19/13  Vaping Use  . Vaping Use: Never used  Substance Use Topics  . Alcohol use: Not Currently  . Drug use: Not Currently    Types: Marijuana     Allergies   Iodinated diagnostic agents, Reglan [metoclopramide], Amoxicillin, Gabapentin, Pregabalin, Lisinopril, Losartan, and Sulfa antibiotics   Review of Systems Review of Systems  Musculoskeletal: Positive for arthralgias.       Right knee pain     Physical Exam Triage Vital Signs ED Triage Vitals [04/20/20 1033]  Enc Vitals Group     BP 133/84     Pulse Rate 96     Resp 18     Temp 98.2 F (36.8 C)     Temp Source Oral     SpO2 98 %     Weight      Height      Head Circumference      Peak Flow      Pain Score 8     Pain Loc      Pain Edu?      Excl. in [place]?    No data found.  Updated Vital Signs BP 133/84 (BP Location: Left Arm)   Pulse 96   Temp 98.2 F (36.8 C) (Oral)   Resp 18   SpO2 98%   Visual Acuity Right Eye Distance:   Left Eye Distance:   Bilateral Distance:    Right Eye Near:   Left Eye Near:    Bilateral Near:     Physical Exam Vitals and nursing note reviewed.   Constitutional:      Appearance: Normal appearance.  Cardiovascular:     Rate and Rhythm: Normal rate.  Pulmonary:     Effort: Pulmonary effort is normal.  Musculoskeletal:     Comments: Right knee: There seems to be some soft tissue swelling on the lateral aspect.  Patient would not permit good exam but there is no tenderness over the patella or medial aspect.  When the lateral joint line is touched patient describes pain  Neurological:     General: No focal deficit present.     Mental Status: She is alert and oriented to person, place, and time.      UC Treatments / Results  Labs (all labs ordered are listed, but only abnormal results are displayed) Labs Reviewed - No data to display  EKG   Radiology No results found.  Procedures Procedures (including critical care time)  Medications Ordered in UC Medications - No data to display  Initial Impression / Assessment and Plan / UC Course  I have reviewed the triage vital signs and the nursing notes.  Pertinent labs & imaging results that were available during my care of the patient were reviewed by me and considered in my medical decision making (see chart for details).     Knee pain, suspect internal derangement.  Will refer to Ortho for further evaluation Final Clinical Impressions(s) / UC Diagnoses   Final diagnoses:  None   Discharge Instructions   None    ED Prescriptions    None     PDMP not reviewed this encounter.   Wardell Honour, MD 04/20/20 1136

## 2020-05-18 ENCOUNTER — Inpatient Hospital Stay: Payer: 59

## 2020-06-29 ENCOUNTER — Other Ambulatory Visit: Payer: Self-pay

## 2020-06-29 ENCOUNTER — Ambulatory Visit (INDEPENDENT_AMBULATORY_CARE_PROVIDER_SITE_OTHER): Payer: 59 | Admitting: Physician Assistant

## 2020-06-29 ENCOUNTER — Encounter: Payer: Self-pay | Admitting: Physician Assistant

## 2020-06-29 VITALS — BP 158/86 | HR 80 | Ht 64.0 in | Wt 181.0 lb

## 2020-06-29 DIAGNOSIS — R112 Nausea with vomiting, unspecified: Secondary | ICD-10-CM

## 2020-06-29 DIAGNOSIS — K219 Gastro-esophageal reflux disease without esophagitis: Secondary | ICD-10-CM | POA: Diagnosis not present

## 2020-06-29 DIAGNOSIS — R1314 Dysphagia, pharyngoesophageal phase: Secondary | ICD-10-CM | POA: Diagnosis not present

## 2020-06-29 MED ORDER — ONDANSETRON HCL 4 MG PO TABS
4.0000 mg | ORAL_TABLET | Freq: Four times a day (QID) | ORAL | 2 refills | Status: DC | PRN
Start: 2020-06-29 — End: 2020-09-28

## 2020-06-29 NOTE — Progress Notes (Signed)
Chief Complaint: Vomiting, GERD, dysphagia  HPI:    Suzanne [name] is a 44 year old African-American female with a past medical history of GERD and others listed below, known to Dr. Ardis Hughs, who presents clinic today for vomiting, GERD and dysphagia.    12/03/2014 EGD was normal.    09/20/2016 patient seen in clinic by Dr. Ardis Hughs for colonoscopy done for unintentional weight loss and loose stools.  She had one 4 mm polyp in the sigmoid colon and otherwise normal.  Pathology was benign and showed no clear etiology for loose stools or weight loss.  Was recommended she have a fecal elastase and a blood test for ANA and given a trial of Flagyl to 50 mg p.o. 3 times daily for 2 weeks.    Today, patient presents to clinic and is somewhat hard to follow with her history.  Apparently has been seen at an urgent care within the past month and diagnosed with a "stomach infection", she thinks this is equivalent to her H. pylori which was diagnosed 10 years ago and given "Tinidazole".  Also diagnosed with positive Covid test at that time.  Tells me that over the past month or more she has been having issues with vomiting and constant nausea.  Also describes that she feels like things are getting stuck in her throat on the way down and that pills have to "turn" in order to go down.  Tells me she has been vomiting almost daily, a combination of food she had the night before as well as yellow bile.  Also describes reflux symptoms regardless of omeprazole 40 mg twice daily.  Associated symptoms include some constipation since onset of symptoms with a bowel movement once or twice a week.    Denies fever, chills or weight loss.  Past Medical History:  Diagnosis Date  . Abnormal Pap smear of cervix   . Allergy   . Anemia   . Anxiety   . Arthritis   . Asthma   . Chest pain   . Chronic lower back pain   . Depression   . Fibroid   . GERD (gastroesophageal reflux disease)   . Hypertension   . Migraine   . Migraines   .  Neck pain, chronic   . Status post dilation of esophageal narrowing   . STD (sexually transmitted disease)    HSV1, in eye    Past Surgical History:  Procedure Laterality Date  . COLPOSCOPY    . ESOPHAGEAL DILATION    . ESOPHAGEAL MANOMETRY N/A 04/01/2013   Procedure: ESOPHAGEAL MANOMETRY (EM);  Surgeon: Milus Banister, MD;  Location: [facility];  Service: Endoscopy;  Laterality: N/A;  . ESOPHAGOGASTRODUODENOSCOPY (EGD) WITH ESOPHAGEAL DILATION N/A 02/28/2013   Procedure: ESOPHAGOGASTRODUODENOSCOPY (EGD) WITH ESOPHAGEAL DILATION;  Surgeon: Milus Banister, MD;  Location: [facility];  Service: Endoscopy;  Laterality: N/A;  . TUBAL LIGATION    . VAGINAL HYSTERECTOMY      Current Outpatient Medications  Medication Sig Dispense Refill  . albuterol (PROVENTIL) (2.5 MG/3ML) 0.083% nebulizer solution Take 3 mLs (2.5 mg total) by nebulization every 4 (four) hours as needed for wheezing or shortness of breath. 75 mL 0  . amLODipine (NORVASC) 10 MG tablet Take 1 tablet (10 mg total) by mouth daily. 30 tablet 0  . carbamazepine (TEGRETOL) 200 MG tablet Take 1 tablet (200 mg total) by mouth daily. 14 tablet 0  . cetirizine (ZYRTEC ALLERGY) 10 MG tablet Take 1 tablet (10 mg total) by mouth daily. [place]  tablet 0  . diclofenac (VOLTAREN) 50 MG EC tablet Take 1 tablet (50 mg total) by mouth 2 (two) times daily. 20 tablet 0  . hydrochlorothiazide (HYDRODIURIL) 25 MG tablet Take 1 tablet (25 mg total) by mouth daily. 30 tablet 0  . Multiple Vitamins-Calcium (ONE-A-DAY WOMENS FORMULA PO) Take 1 tablet by mouth daily.    [place] Kitchen omeprazole (PRILOSEC) 20 MG capsule Take 1 capsule (20 mg total) by mouth daily. 30 capsule 3   No current facility-administered medications for this visit.    Allergies as of 06/29/2020 - Review Complete 06/29/2020  Allergen Reaction Noted  . Iodinated diagnostic agents Rash and Anaphylaxis 11/14/2011  . Reglan [metoclopramide] Shortness Of Breath 11/14/2011  . Amoxicillin Rash  11/14/2011  . Gabapentin Swelling 12/09/2019  . Pregabalin Swelling 12/09/2019  . Lisinopril Cough 07/02/2013  . Losartan Cough 07/02/2013  . Sulfa antibiotics Nausea And Vomiting 02/19/2014    Family History  Problem Relation Age of Onset  . Heart disease Father        H/O CABG  . Hypertension Mother   . Hypertension Maternal Grandmother   . Thyroid disease Maternal Grandmother   . Diabetes Maternal Grandmother   . Hypertension Paternal Grandmother   . Diabetes Paternal Grandmother   . Cancer - Lung Paternal Grandmother   . Hypertension Maternal Aunt   . Thyroid disease Maternal Aunt   . Esophageal cancer Other        age unknow  . Colon cancer Neg Hx   . Stomach cancer Neg Hx     Social History   Socioeconomic History  . Marital status: Married    Spouse name: [name]  . Number of children: 2  . Years of education: College  . Highest education level: Not on file  Occupational History  . Occupation: food Public house manager: OTHER    Comment: Geographical information systems officer  Tobacco Use  . Smoking status: Former Smoker    Packs/day: 0.10    Years: 10.00    Pack years: 1.00    Types: Cigars    Quit date: 12/06/2017    Years since quitting: 2.5  . Smokeless tobacco: Never Used  . Tobacco comment: smokes 2 Black & Milds per day 08/19/13  Vaping Use  . Vaping Use: Never used  Substance and Sexual Activity  . Alcohol use: Not Currently  . Drug use: Not Currently    Types: Marijuana  . Sexual activity: Yes    Partners: Male    Birth control/protection: Surgical, None    Comment: hysterectomy  Other Topics Concern  . Not on file  Social History Narrative   Patient lives at home with family.   Patient goes to [employer].   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   Social Determinants of Health   Financial Resource Strain: Not on file  Food Insecurity: Not on file  Transportation Needs: Not on file   Physical Activity: Not on file  Stress: Not on file  Social Connections: Not on file  Intimate Partner Violence: Not on file    Review of Systems:    Constitutional: No weight loss, fever or chills Cardiovascular: No chest pain Respiratory: No SOB  Gastrointestinal: See HPI and otherwise negative Genitourinary: No dysuria or change in urinary frequency Neurological: No headache, dizziness or syncope Musculoskeletal: No new muscle or joint pain Hematologic: No bleeding Psychiatric: No history of depression  or anxiety   Physical Exam:  Vital signs: BP (!) 158/86   Pulse 80   Ht 5\' 4"  (1.626 m)   Wt 181 lb (82.1 kg)   BMI 31.07 kg/m   Constitutional:   Pleasant AA female appears to be in NAD, Well developed, Well nourished, alert and cooperative Head:  Normocephalic and atraumatic. Eyes:   PEERL, EOMI. No icterus. Conjunctiva pink. Ears:  Normal auditory acuity. Neck:  Supple Throat: Oral cavity and pharynx without inflammation, swelling or lesion.  Respiratory: Respirations even and unlabored. Lungs clear to auscultation bilaterally.   No wheezes, crackles, or rhonchi.  Cardiovascular: Normal S1, S2. No MRG. Regular rate and rhythm. No peripheral edema, cyanosis or pallor.  Gastrointestinal:  Soft, nondistended, mild epigastric ttp. No rebound or guarding. Normal bowel sounds. No appreciable masses or hepatomegaly. Rectal:  Not performed.  Msk:  Symmetrical without gross deformities. Without edema, no deformity or joint abnormality.  Neurologic:  Alert and  oriented x4;  grossly normal neurologically.  Skin:   Dry and intact without significant lesions or rashes. Psychiatric:Demonstrates good judgement and reason without abnormal affect or behaviors.  RELEVANT LABS AND IMAGING: CBC    Component Value Date/Time   WBC 11.3 (H) 11/18/2019 0908   WBC 6.8 10/22/2019 1101   RBC 4.72 11/18/2019 0908   RBC 4.69 11/18/2019 0908   HGB 11.3 (L) 11/18/2019 0908   HCT 35.5 (L)  11/18/2019 0908   PLT 307 11/18/2019 0908   MCV 75.7 (L) 11/18/2019 0908   MCH 24.1 (L) 11/18/2019 0908   MCHC 31.8 11/18/2019 0908   RDW 15.9 (H) 11/18/2019 0908   LYMPHSABS 1.1 11/18/2019 0908   MONOABS 0.4 11/18/2019 0908   EOSABS 0.0 11/18/2019 0908   BASOSABS 0.0 11/18/2019 0908    CMP     Component Value Date/Time   NA 141 11/18/2019 0908   K 4.7 11/18/2019 0908   CL 107 11/18/2019 0908   CO2 24 11/18/2019 0908   GLUCOSE 113 (H) 11/18/2019 0908   BUN 19 11/18/2019 0908   CREATININE 0.80 11/18/2019 0908   CREATININE 0.79 08/07/2018 0941   CREATININE 0.58 07/19/2013 1138   CALCIUM 9.9 11/18/2019 0908   PROT 7.5 11/18/2019 0908   ALBUMIN 3.8 11/18/2019 0908   AST 15 11/18/2019 0908   AST 22 08/07/2018 0941   ALT 19 11/18/2019 0908   ALT 19 08/07/2018 0941   ALKPHOS 70 11/18/2019 0908   BILITOT <0.2 (L) 11/18/2019 0908   BILITOT 0.3 08/07/2018 0941   GFRNONAA >60 11/18/2019 0908   GFRNONAA >60 08/07/2018 0941   GFRAA >60 11/18/2019 0908   GFRAA >60 08/07/2018 0941    Assessment: 1.  Nausea and vomiting: For the past month and a half with some reflux symptoms, history of H. pylori per patient; consider post Covid dyspepsia versus PUD versus other 2.  GERD: With above 3.  Dysphagia: To some pills and food; consider stricture versus other  Plan: 1.  Scheduled patient for diagnostic EGD in the [place] with Dr. Ardis Hughs.  Patient was given a detailed list of risks for the procedure and she agrees to proceed.  She had Covid recently. 2.  Prescribed Zofran 4 mg ODT every 4-6 hours as needed for nausea #30 with 2 refills. 3.  Encouraged the patient continue her Omeprazole 40 mg twice daily.  Discussed that this may need to be changed if there are signs of gastritis/inflammation 4.  Briefly discussed gastroparesis, patient has never had a diagnosis of diabetes  making this less likely. 5.  Patient to follow in clinic per recommendations from Dr. Ardis Hughs after time of  procedure.  Ellouise Newer, PA-C [facility] 06/29/2020, 3:23 PM  Cc: [facility]*

## 2020-06-29 NOTE — Patient Instructions (Addendum)
If you are age 45 or older, your body mass index should be between 23-30. Your Body mass index is 31.07 kg/m. If this is out of the aforementioned range listed, please consider follow up with your Primary Care Provider.  If you are age 83 or younger, your body mass index should be between 19-25. Your Body mass index is 31.07 kg/m. If this is out of the aformentioned range listed, please consider follow up with your Primary Care Provider.   We have sent the following medications to your pharmacy for you to pick up at your convenience: Zofran 4 mg   Continue Omeprazole 40 mg twice daily   You have been scheduled for an endoscopy. Please follow written instructions given to you at your visit today. If you use inhalers (even only as needed), please bring them with you on the day of your procedure.  Thank you for choosing me and [facility].  Ellouise Newer, PA-C

## 2020-06-30 NOTE — Progress Notes (Signed)
I agree with the above note, plan 

## 2020-07-06 ENCOUNTER — Other Ambulatory Visit: Payer: Self-pay | Admitting: Gastroenterology

## 2020-07-06 ENCOUNTER — Encounter: Payer: Self-pay | Admitting: Gastroenterology

## 2020-07-06 ENCOUNTER — Ambulatory Visit (AMBULATORY_SURGERY_CENTER): Payer: 59 | Admitting: Gastroenterology

## 2020-07-06 ENCOUNTER — Other Ambulatory Visit: Payer: Self-pay

## 2020-07-06 VITALS — BP 154/98 | HR 57 | Temp 98.7°F | Resp 12 | Ht 64.0 in | Wt 181.0 lb

## 2020-07-06 DIAGNOSIS — B9681 Helicobacter pylori [H. pylori] as the cause of diseases classified elsewhere: Secondary | ICD-10-CM

## 2020-07-06 DIAGNOSIS — R112 Nausea with vomiting, unspecified: Secondary | ICD-10-CM

## 2020-07-06 DIAGNOSIS — R12 Heartburn: Secondary | ICD-10-CM | POA: Diagnosis present

## 2020-07-06 DIAGNOSIS — K2970 Gastritis, unspecified, without bleeding: Secondary | ICD-10-CM

## 2020-07-06 DIAGNOSIS — K219 Gastro-esophageal reflux disease without esophagitis: Secondary | ICD-10-CM

## 2020-07-06 MED ORDER — SODIUM CHLORIDE 0.9 % IV SOLN: 500.0000 mL | Freq: Once | INTRAVENOUS | Status: DC

## 2020-07-06 NOTE — Progress Notes (Signed)
Called to room to assist during endoscopic procedure.  Patient ID and intended procedure confirmed with present staff. Received instructions for my participation in the procedure from the performing physician.  

## 2020-07-06 NOTE — Progress Notes (Signed)
PT taken to PACU. Monitors in place. VSS. Report given to RN. 

## 2020-07-06 NOTE — Patient Instructions (Signed)
Handout given for gastritis.  Try to avoid use of NSAIDS (Non-Steroidal anti-inflammatory drugs).  (These include, aspirin, aspirin-containing products, ibuprofen, advil, motrin, naproxen, aleve, goody powders, etc) Tylenol is ok to take as needed, see label for instructions.   YOU HAD AN ENDOSCOPIC PROCEDURE TODAY AT [facility]:   Refer to the procedure report that was given to you for any specific questions about what was found during the examination.  If the procedure report does not answer your questions, please call your gastroenterologist to clarify.  If you requested that your care partner not be given the details of your procedure findings, then the procedure report has been included in a sealed envelope for you to review at your convenience later.  YOU SHOULD EXPECT: Some feelings of bloating in the abdomen. Passage of more gas than usual.  Walking can help get rid of the air that was put into your GI tract during the procedure and reduce the bloating. If you had a lower endoscopy (such as a colonoscopy or flexible sigmoidoscopy) you may notice spotting of blood in your stool or on the toilet paper. If you underwent a bowel prep for your procedure, you may not have a normal bowel movement for a few days.  Please Note:  You might notice some irritation and congestion in your nose or some drainage.  This is from the oxygen used during your procedure.  There is no need for concern and it should clear up in a day or so.  SYMPTOMS TO REPORT IMMEDIATELY:   Following upper endoscopy (EGD)  Vomiting of blood or coffee ground material  New chest pain or pain under the shoulder blades  Painful or persistently difficult swallowing  New shortness of breath  Fever of 100F or higher  Black, tarry-looking stools  For urgent or emergent issues, a gastroenterologist can be reached at any hour by calling [phone]. Do not use MyChart messaging for urgent concerns.    DIET:   We do recommend a small meal at first, but then you may proceed to your regular diet.  Drink plenty of fluids but you should avoid alcoholic beverages for 24 hours.  ACTIVITY:  You should plan to take it easy for the rest of today and you should NOT DRIVE, Work or use heavy machinery until tomorrow (because of the sedation medicines used during the test).    FOLLOW UP: Our staff will call the number listed on your records Wednesday 2/9 around 715 AM- 8 AM  following your procedure to check on you and address any questions or concerns that you may have regarding the information given to you following your procedure. If we do not reach you, we will leave a message.  We will attempt to reach you two times.  During this call, we will ask if you have developed any symptoms of COVID 19. If you develop any symptoms (ie: fever, flu-like symptoms, shortness of breath, cough etc.) before then, please call [phone].  If you test positive for Covid 19 in the 2 weeks post procedure, please call and report this information to [place].    If any biopsies were taken you will be contacted by phone or by letter within the next 1-3 weeks.  Please call us at [phone] if you have not heard about the biopsies in 3 weeks.    SIGNATURES/CONFIDENTIALITY: You and/or your care partner have signed paperwork which will be entered into your electronic medical record.  These signatures attest to  the fact that that the information above on your After Visit Summary has been reviewed and is understood.  Full responsibility of the confidentiality of this discharge information lies with you and/or your care-partner.

## 2020-07-06 NOTE — Progress Notes (Signed)
CW vital sand AW IV.

## 2020-07-06 NOTE — Op Note (Signed)
[facility] Patient Name: Suzanne [name] Procedure Date: 07/06/2020 8:21 AM MRN: [id number] Endoscopist: Milus Banister , MD Age: 45 Referring MD:  Date of Birth: 1976-01-15 Gender: Female Account #: [id number] Procedure:                Upper GI endoscopy Indications:              Dysphagia, Heartburn, Nausea with vomiting Medicines:                Monitored Anesthesia Care Procedure:                Pre-Anesthesia Assessment:                           - Prior to the procedure, a History and Physical                            was performed, and patient medications and                            allergies were reviewed. The patient's tolerance of                            previous anesthesia was also reviewed. The risks                            and benefits of the procedure and the sedation                            options and risks were discussed with the patient.                            All questions were answered, and informed consent                            was obtained. Prior Anticoagulants: The patient has                            taken no previous anticoagulant or antiplatelet                            agents. ASA Grade Assessment: II - A patient with                            mild systemic disease. After reviewing the risks                            and benefits, the patient was deemed in                            satisfactory condition to undergo the procedure.                           After obtaining informed consent, the endoscope was  passed under direct vision. Throughout the                            procedure, the patient's blood pressure, pulse, and                            oxygen saturations were monitored continuously. The                            Endoscope was introduced through the mouth, and                            advanced to the second part of duodenum. The upper                            GI endoscopy  was accomplished without difficulty.                            The patient tolerated the procedure well. Scope In: Scope Out: Findings:                 Mild inflammation characterized by erythema,                            friability and granularity was found in the gastric                            antrum. Biopsies were taken with a cold forceps for                            histology.                           The exam was otherwise without abnormality. Complications:            No immediate complications. Estimated blood loss:                            None. Estimated Blood Loss:     Estimated blood loss: none. Impression:               - Gastritis. Biopsied.                           - The examination was otherwise normal. Recommendation:           - Patient has a contact number available for                            emergencies. The signs and symptoms of potential                            delayed complications were discussed with the                            patient. Return to normal activities tomorrow.  Written discharge instructions were provided to the                            patient.                           - Resume previous diet.                           - Continue present medications. Please take the                            zofran twice daily as needed for nausea for now.                           - Await pathology results. Milus Banister, MD 07/06/2020 8:39:30 AM This report has been signed electronically.

## 2020-07-08 ENCOUNTER — Telehealth: Payer: Self-pay

## 2020-07-08 NOTE — Telephone Encounter (Signed)
First post procedure follow up call, no answer 

## 2020-07-08 NOTE — Telephone Encounter (Signed)
  Follow up Call-  Call back number 07/06/2020  Post procedure Call Back phone  # [phone]  Permission to leave phone message Yes  Some recent data might be hidden     Patient questions:  Do you have a fever, pain , or abdominal swelling? No. Pain Score  0 *  Have you tolerated food without any problems? Yes.    Have you been able to return to your normal activities? Yes.    Do you have any questions about your discharge instructions: Diet   No. Medications  No. Follow up visit  No.  Do you have questions or concerns about your Care? No.  Actions: * If pain score is 4 or above: No action needed, pain <4.  1. Have you developed a fever since your procedure? no  2.   Have you had an respiratory symptoms (SOB or cough) since your procedure? no  3.   Have you tested positive for COVID 19 since your procedure no  4.   Have you had any family members/close contacts diagnosed with the COVID 19 since your procedure?  no   If yes to any of these questions please route to Joylene John, RN and Joella Prince, RN

## 2020-07-14 ENCOUNTER — Other Ambulatory Visit: Payer: Self-pay

## 2020-07-14 ENCOUNTER — Other Ambulatory Visit: Payer: Self-pay | Admitting: Gastroenterology

## 2020-07-14 MED ORDER — PYLERA 140-125-125 MG PO CAPS: 3.0000 | ORAL_CAPSULE | Freq: Three times a day (TID) | ORAL | 0 refills | Status: DC

## 2020-07-16 ENCOUNTER — Other Ambulatory Visit: Payer: Self-pay | Admitting: Gastroenterology

## 2020-07-16 ENCOUNTER — Telehealth: Payer: Self-pay

## 2020-07-16 ENCOUNTER — Other Ambulatory Visit: Payer: Self-pay

## 2020-07-16 MED ORDER — METRONIDAZOLE 500 MG PO TABS: 500.0000 mg | ORAL_TABLET | Freq: Four times a day (QID) | ORAL | 0 refills | Status: AC

## 2020-07-16 MED ORDER — TETRACYCLINE HCL 500 MG PO CAPS: 500.0000 mg | ORAL_CAPSULE | Freq: Four times a day (QID) | ORAL | 0 refills | Status: AC

## 2020-07-16 MED ORDER — TETRACYCLINE HCL 500 MG PO CAPS: 500.0000 mg | ORAL_CAPSULE | Freq: Four times a day (QID) | ORAL | 0 refills | Status: DC

## 2020-07-16 MED ORDER — BISMUTH 262 MG PO CHEW: 2.0000 | CHEWABLE_TABLET | Freq: Four times a day (QID) | ORAL | 0 refills | Status: AC

## 2020-07-16 NOTE — Telephone Encounter (Signed)
Tetracycline is backordered at pharmacy.  Should I try sending to another pharmacy or is there another antibiotic we can send in?

## 2020-07-16 NOTE — Telephone Encounter (Signed)
Patient would like to try [facility] on [address].  Patient aware that I will send to [facility] and if they do not have it, she asked that I try [facility] on [address].

## 2020-07-16 NOTE — Telephone Encounter (Signed)
Left message for patient to return call to verify another pharmacy of her choice to see if medication will be available.  Will continue efforts.

## 2020-07-16 NOTE — Telephone Encounter (Signed)
Patient states that Pylera is not cost effective as it will be over $1000.00. Patient advised alternative for Pylera will be sent to pharmacy as suggested by Dr Ardis Hughs.  Patient instructed to increase omeprazole 20mg  one capsule twice daily for 10 days. Patient agreed to plan and verbalized understanding.

## 2020-07-16 NOTE — Addendum Note (Signed)
Addended by: Stevan Born on: 07/16/2020 09:01 AM   Modules accepted: Orders

## 2020-07-16 NOTE — Telephone Encounter (Signed)
Please try other pharmacies.  thanks

## 2020-07-16 NOTE — Telephone Encounter (Signed)
   If Pylera is cost prohibitive: 10 days of Bismuth subsalicylate 262mg  pills, two pills QID; Metronidazoe 500mg  pill,one pill QID; Tetracycline 500mg  pill, one pill QID.  At the same time they need to be on twice daily PPI.

## 2020-07-16 NOTE — Telephone Encounter (Signed)
Tetracycline 500mg  was sent to another pharmacy per patient's request, but it is going to cost $633.00 for a 10 day supply.  Can you please advise next steps.

## 2020-07-17 DIAGNOSIS — Z8619 Personal history of other infectious and parasitic diseases: Secondary | ICD-10-CM | POA: Insufficient documentation

## 2020-07-17 NOTE — Telephone Encounter (Signed)
She has PCN allergy and cannot afford pylera type Abx even if generics used.  Options are limited.  Any chance we can get her a cheap or (better) free course of pylera through the drug company that makes it?  Or tetracyline course through the drug company that makes tetracyclie?

## 2020-07-17 NOTE — Telephone Encounter (Signed)
I attempted to obtain a prior authorization as requested by pharmacy as they stated insurance plan did not cover medication.  As it turned out pharmacy was running medication with wrong insurance coverage.  I called to pharmacy and updated patient's insurance information with LeAnn at [facility] and tetracycline will cost $20 for 10 day supply.  Patient was notified of the cost of medication and will pick up at pharmacy.  No further questions.

## 2020-07-27 NOTE — Telephone Encounter (Signed)
Patient states that she has been having extreme nausea over the past several days.  She states she has two days left of tetracycline. She was also recently started on vancomycin for an infection in her eye.  She is taking Zofran for nausea, but she states that is only making her sleepy.  She wants to know if taking both tetracycline and vancomycin could be causing nausea.    Please advise.  Thank you, Suzanne [name]

## 2020-07-27 NOTE — Telephone Encounter (Signed)
Patient calling to follow up on previous message. 

## 2020-07-28 MED ORDER — PROMETHAZINE HCL 25 MG PO TABS: 25.0000 mg | ORAL_TABLET | Freq: Two times a day (BID) | ORAL | 0 refills | Status: DC | PRN

## 2020-07-28 NOTE — Addendum Note (Signed)
Addended by: Stevan Born on: 07/28/2020 11:38 AM   Modules accepted: Orders

## 2020-07-28 NOTE — Telephone Encounter (Signed)
She certainly might be having a side effect of her antibiotics, H. pylori treatment in addition to the vancomycin started by another provider.  It is pretty essential that she continue her H. pylori antibiotics to completion.  Please offer her Phenergan 25 mg p.o. every 12 hours as needed nausea to take in addition to the Zofran.  This can also make her sleepy but we need to get her through the antibiotics.  Dispense 10 pills, 0 refills.  Thank you

## 2020-07-28 NOTE — Telephone Encounter (Signed)
Patient aware of Dr Ardis Hughs' recommendations regarding antibiotics and the possibility of taking both causing her nausea.  Patient advised to take Phenergan 25mg  one tablet every 12 hours as needed for nausea in addition to Zofran.  Patient instructed to complete antibiotics for H. Pylori treatment.  Rx sent to [facility] as requested by patient. Patient advised to contact office with any further complications.  Patient agreed to plan and verbalized understanding.  No further questions.

## 2020-09-28 ENCOUNTER — Other Ambulatory Visit (INDEPENDENT_AMBULATORY_CARE_PROVIDER_SITE_OTHER): Payer: Self-pay

## 2020-09-28 ENCOUNTER — Ambulatory Visit (INDEPENDENT_AMBULATORY_CARE_PROVIDER_SITE_OTHER): Payer: 59 | Admitting: Nurse Practitioner

## 2020-09-28 ENCOUNTER — Encounter: Payer: Self-pay | Admitting: Nurse Practitioner

## 2020-09-28 VITALS — BP 114/70 | HR 74 | Ht 64.0 in | Wt 179.4 lb

## 2020-09-28 DIAGNOSIS — Z8619 Personal history of other infectious and parasitic diseases: Secondary | ICD-10-CM

## 2020-09-28 DIAGNOSIS — R197 Diarrhea, unspecified: Secondary | ICD-10-CM

## 2020-09-28 DIAGNOSIS — R112 Nausea with vomiting, unspecified: Secondary | ICD-10-CM

## 2020-09-28 LAB — COMPREHENSIVE METABOLIC PANEL
ALT: 11 U/L (ref 0–35)
AST: 14 U/L (ref 0–37)
Albumin: 4.1 g/dL (ref 3.5–5.2)
Alkaline Phosphatase: 66 U/L (ref 39–117)
BUN: 14 mg/dL (ref 6–23)
CO2: 35 mEq/L — ABNORMAL HIGH (ref 19–32)
Calcium: 9.7 mg/dL (ref 8.4–10.5)
Chloride: 99 mEq/L (ref 96–112)
Creatinine, Ser: 0.76 mg/dL (ref 0.40–1.20)
GFR: 94.8 mL/min (ref >60.00)
Glucose, Bld: 90 mg/dL (ref 70–99)
Potassium: 3.6 mEq/L (ref 3.5–5.1)
Sodium: 140 mEq/L (ref 135–145)
Total Bilirubin: 0.3 mg/dL (ref 0.2–1.2)
Total Protein: 7.6 g/dL (ref 6.0–8.3)

## 2020-09-28 LAB — CBC WITH DIFFERENTIAL/PLATELET
Basophils Absolute: 0.1 K/uL (ref 0.0–0.1)
Basophils Relative: 1 % (ref 0.0–3.0)
Eosinophils Absolute: 0.3 K/uL (ref 0.0–0.7)
Eosinophils Relative: 4.2 % (ref 0.0–5.0)
HCT: 34.7 % — ABNORMAL LOW (ref 36.0–46.0)
Hemoglobin: 11.1 g/dL — ABNORMAL LOW (ref 12.0–15.0)
Lymphocytes Relative: 39.7 % (ref 12.0–46.0)
Lymphs Abs: 2.9 K/uL (ref 0.7–4.0)
MCHC: 31.9 g/dL (ref 30.0–36.0)
MCV: 73.5 fl — ABNORMAL LOW (ref 78.0–100.0)
Monocytes Absolute: 0.7 K/uL (ref 0.1–1.0)
Monocytes Relative: 9.2 % (ref 3.0–12.0)
Neutro Abs: 3.4 K/uL (ref 1.4–7.7)
Neutrophils Relative %: 45.9 % (ref 43.0–77.0)
Platelets: 226 K/uL (ref 150.0–400.0)
RBC: 4.73 Mil/uL (ref 3.87–5.11)
RDW: 14.9 % (ref 11.5–15.5)
WBC: 7.3 K/uL (ref 4.0–10.5)

## 2020-09-28 LAB — LIPASE: Lipase: 22 U/L (ref 11.0–59.0)

## 2020-09-28 MED ORDER — PROMETHAZINE HCL 25 MG PO TABS: 25.0000 mg | ORAL_TABLET | Freq: Two times a day (BID) | ORAL | 0 refills | Status: DC | PRN

## 2020-09-28 MED ORDER — ONDANSETRON HCL 4 MG PO TABS: 4.0000 mg | ORAL_TABLET | Freq: Four times a day (QID) | ORAL | 0 refills | Status: DC | PRN

## 2020-09-28 NOTE — Progress Notes (Signed)
09/28/2020 Suzanne [name] [id number] [birth year]   Chief Complaint: Nausea, vomiting and diarrhea   History of Present Illness: Suzanne [name]. Keegan is a 45 year old female with a history of nausea, vomiting, GERD and dysphagia.  She underwent an EGD by Dr. Ardis Hughs on 07/06/2020 which identified identified Helicobacter pylori gastritis.  She was prescribed Pylera 3 tabs p.o. 4 times daily and Omeprazole 20 mg p.o. twice daily twice daily for 10 days.  She stated she vomited a few doses of Pylera. She took Ondansetron and Phenergan as needed and she was able to complete the rest of her Pylera course.  She remains on Omeprazole 20 mg twice daily.  If she stops the Omeprazole she develops foamy burning acid reflux.  She is now having dysphagia.  Scribes having pills or solid food that stops in the mid esophagus and slowly passes down into the stomach which occurs daily.  She continues to have nausea.  She complains of having nausea with vomiting nonbloody yellow bilious type emesis in the morning.  She intermittently vomits digested food shortly after eating.  No specific food triggers. She takes Phenergan 25mg  po 2-4 times daily and Zofran 4 po once or twice daily.  No further vomiting for the past 4 weeks.  Her diarrhea improved while she took the Pylera, she reported passing solid stools for a few weeks.  However, her diarrhea recurred 2 weeks ago.  She describes passing 2-3 nonbloody loose stools daily.  She takes Imodium before dinner several days weekly.  No fever.  She underwent a colonoscopy 09/20/2016 which identified a 4 mm polyp which was removed from sigmoid colon, biopsy showed benign lymphoid aggregate and not a true polyp.  Evidence of colitis.  No noticeable weight loss.  She takes 2-3 Excedrin tablets daily for the past 2 to 3 years for headaches and she occasionally will take 4-5 Excedrin tablets if her headache is severe.  EGD 07/06/2020: Gastritis. Biopsied. - The examination was otherwise  normal. - CHRONIC ACTIVE GASTRITIS WITH HELICOBACTER PYLORI. - WARTHIN-STARRY IS POSITIVE FOR HELICOBACTER PYLORI. - NO INTESTINAL METAPLASIA, DYSPLASIA, OR MALIGNANCY.  Colonoscopy 09/20/2016: The examined portion of the ileum was normal. - One 4 mm polyp in the sigmoid colon, removed with a cold snare. Resected and retrieved. - The examination was otherwise normal on direct and retroflexion views. - Biopsies were taken with a cold forceps from the entire colon for evaluation of microscopic Colitis. Biopsy Result: 1. Surgical [P], random sites - BENIGN COLORECTAL MUCOSA. - ASSOCIATED BENIGN LYMPHOID AGGREGATES. - NO EVIDENCE OF SIGNIFICANT INFLAMMATION, DYSPLASIA OR MALIGNANCY. 2. Surgical [P], sigmoid, polyp - BENIGN POLYPOID COLORECTAL MUCOSA WITH ASSOCIATED BENIGN LYMPHOID AGGREGATE(S) AND FOCAL GLANDULAR DILATATION. - NO DYSPLASIA OR MALIGNANCY IDENTIFIED. - SEE COMMENT.  Past Medical History:  Diagnosis Date  . Abnormal Pap smear of cervix   . Allergy   . Anemia   . Anxiety   . Arthritis   . Asthma   . Chest pain   . Chronic lower back pain   . Depression   . Fibroid   . GERD (gastroesophageal reflux disease)   . Hypertension   . Migraine   . Migraines   . Neck pain, chronic   . Status post dilation of esophageal narrowing   . STD (sexually transmitted disease)    HSV1, in eye   Past Surgical History:  Procedure Laterality Date  . COLPOSCOPY    . ESOPHAGEAL DILATION    . ESOPHAGEAL MANOMETRY N/A 04/01/2013  Procedure: ESOPHAGEAL MANOMETRY (EM);  Surgeon: Milus Banister, MD;  Location: [facility];  Service: Endoscopy;  Laterality: N/A;  . ESOPHAGOGASTRODUODENOSCOPY (EGD) WITH ESOPHAGEAL DILATION N/A 02/28/2013   Procedure: ESOPHAGOGASTRODUODENOSCOPY (EGD) WITH ESOPHAGEAL DILATION;  Surgeon: Milus Banister, MD;  Location: [facility];  Service: Endoscopy;  Laterality: N/A;  . TUBAL LIGATION    . VAGINAL HYSTERECTOMY     Current Outpatient Medications on  File Prior to Visit  Medication Sig Dispense Refill  . albuterol (PROVENTIL) (2.5 MG/3ML) 0.083% nebulizer solution Take 3 mLs (2.5 mg total) by nebulization every 4 (four) hours as needed for wheezing or shortness of breath. 75 mL 0  . amLODipine (NORVASC) 10 MG tablet Take 1 tablet (10 mg total) by mouth daily. 30 tablet 0  . carbamazepine (TEGRETOL) 200 MG tablet Take 1 tablet (200 mg total) by mouth daily. 14 tablet 0  . cetirizine (ZYRTEC ALLERGY) 10 MG tablet Take 1 tablet (10 mg total) by mouth daily. 30 tablet 0  . diclofenac (VOLTAREN) 50 MG EC tablet Take 1 tablet (50 mg total) by mouth 2 (two) times daily. 20 tablet 0  . hydrochlorothiazide (HYDRODIURIL) 25 MG tablet Take 1 tablet (25 mg total) by mouth daily. 30 tablet 0  . Multiple Vitamins-Calcium (ONE-A-DAY WOMENS FORMULA PO) Take 1 tablet by mouth daily.    Marland Kitchen omeprazole (PRILOSEC) 20 MG capsule Take 20 mg by mouth daily.    . [DISCONTINUED] fluticasone (FLONASE) 50 MCG/ACT nasal spray Place 1 spray into both nostrils daily. 16 g 0   No current facility-administered medications on file prior to visit.   Allergies  Allergen Reactions  . Iodinated Diagnostic Agents Rash and Anaphylaxis  . Reglan [Metoclopramide] Shortness Of Breath  . Amoxicillin Rash    Has patient had a PCN reaction causing immediate rash, facial/tongue/throat swelling, SOB or lightheadedness with hypotension: Yes Has patient had a PCN reaction causing severe rash involving mucus membranes or skin necrosis: Broke out face & buttocks Has patient had a PCN reaction that required hospitalization: No Has patient had a PCN reaction occurring within the last 10 years: No If all of the above answers are "NO", then may proceed with Cephalosporin use  . Gabapentin Swelling  . Pregabalin Swelling    Paranoid and anxiety  . Lisinopril Cough  . Losartan Cough  . Sulfa Antibiotics Nausea And Vomiting     Current Medications, Allergies, Past Medical History, Past  Surgical History, Family History and Social History were reviewed in [employer] record.   Review of Systems:   Constitutional: Negative for fever, sweats, chills or weight loss.  Respiratory: Negative for shortness of breath.   Cardiovascular: Negative for chest pain, palpitations and leg swelling.  Gastrointestinal: See HPI.  Musculoskeletal: Negative for back pain or muscle aches.  Neurological: Negative for dizziness, headaches or paresthesias.    Physical Exam: BP 114/70   Pulse 74   Ht 5\' 4"  (1.626 m)   Wt 179 lb 6.4 oz (81.4 kg)   BMI 30.79 kg/m  General: 45 year old female in no acute distress. Head: Normocephalic and atraumatic. Eyes: No scleral icterus. Conjunctiva pink . Ears: Normal auditory acuity. Mouth: Dentition intact. No ulcers or lesions.  No oral thrush. Lungs: Clear throughout to auscultation. Heart: Regular rate and rhythm, no murmur. Abdomen: Soft, nontender and nondistended. No masses or hepatomegaly. Normal bowel sounds x 4 quadrants.  Rectal: Deferred.  Musculoskeletal: Symmetrical with no gross deformities. Extremities: No edema. Neurological: Alert oriented x 4. No focal  deficits.  Psychological: Alert and cooperative. Normal mood and affect  Assessment and Recommendations:  1.  GERD and dysphagia. S/P EGD 07/06/2020 identified H. Pylori gastritis treated with Pylera x and PPI bid x 10 days ( a few doses were expelled with vomiting episodes).  H. pylori bacteria may not be eradicated. On daily Excedrin 2 to 5 tabs daily for the past year.  -Stop Excedrin -Continue Omeprazole 20 mg 1 p.o. twice daily for now -Unable to perform post treatment H. Pylrori stool antigen or breath test as she remains on dependent on PPI bid (significant reflux symptoms if she skips doses) -Consider barium swallow with tablet to rule out esophageal dysmotility as the patient stated she had the same dysphagia symptoms prior to her EGD  2.  Nausea and  vomiting -Right upper quadrant abdominal sonogram to evaluate the gallbladder -CBC, CMP and lipase level -Phenergan 25 mg 1 p.o. every 12 hours as needed.  Ondansetron 4 mg 1 p.o. every 6 hours as needed.  3.  Diarrhea -GI pathogen panel to include C. difficile PCR  4. Chronic headaches -Stop Excedrin as noted above -I advised the patient to follow-up with her PCP for headache evaluation and non-NSAID treatment    Patient to call our office if symptoms worsen Further follow-up to be determined after the above evaluation completed

## 2020-09-28 NOTE — Patient Instructions (Addendum)
If you are age 45 or younger, your body mass index should be between 19-25. Your Body mass index is 30.79 kg/m. If this is out of the aformentioned range listed, please consider follow up with your Primary Care Provider.   LABS:  Lab work has been ordered for you today. Our lab is located in the basement. Press "B" on the elevator. The lab is located at the first door on the left as you exit the elevator.  HEALTHCARE LAWS AND MY CHART RESULTS: Due to recent changes in healthcare laws, you may see the results of your imaging and laboratory studies on MyChart before your provider has had a chance to review them.   We understand that in some cases there may be results that are confusing or concerning to you. Not all laboratory results come back in the same time frame and the provider may be waiting for multiple results in order to interpret others.  Please give [place] 48 hours in order for your provider to thoroughly review all the results before contacting the office for clarification of your results.   Phenergan and Zofran have been refilled. Please take a probiotic of your choice daily. Stop taking Excedrin and follow up with your primary care provider regarding headaches.  IMAGING:  . You will be contacted by [facility] (Your caller ID will indicate phone # [phone]) in the next 2 days to schedule your Ultrasound. If you have not heard from them within 2 business days, please call [facility] at [phone] to follow up on the status of your appointment.     Please call our office if your symptoms worsen.  It was great seeing you today! Thank you for entrusting me with your care and choosing [facility].  Noralyn Pick, CRNP

## 2020-09-29 NOTE — Progress Notes (Signed)
I agree with the above note, plan 

## 2020-10-02 ENCOUNTER — Other Ambulatory Visit: Payer: Self-pay

## 2020-10-02 ENCOUNTER — Other Ambulatory Visit (INDEPENDENT_AMBULATORY_CARE_PROVIDER_SITE_OTHER): Payer: Self-pay

## 2020-10-02 DIAGNOSIS — Z8619 Personal history of other infectious and parasitic diseases: Secondary | ICD-10-CM

## 2020-10-02 DIAGNOSIS — R197 Diarrhea, unspecified: Secondary | ICD-10-CM

## 2020-10-02 DIAGNOSIS — D649 Anemia, unspecified: Secondary | ICD-10-CM

## 2020-10-02 DIAGNOSIS — R112 Nausea with vomiting, unspecified: Secondary | ICD-10-CM

## 2020-10-02 LAB — VITAMIN B12: Vitamin B-12: 874 pg/mL (ref 211–911)

## 2020-10-03 LAB — IRON,TIBC AND FERRITIN PANEL
%SAT: 35 % (calc) (ref 16–45)
Ferritin: 180 ng/mL (ref 16–232)
Iron: 81 ug/dL (ref 40–190)
TIBC: 234 mcg/dL (calc) — ABNORMAL LOW (ref 250–450)

## 2020-10-05 ENCOUNTER — Ambulatory Visit (HOSPITAL_COMMUNITY)
Admission: RE | Admit: 2020-10-05 | Discharge: 2020-10-05 | Disposition: A | Discharge status: Home / Self Care | Payer: Self-pay | Source: Ambulatory Visit | Attending: Nurse Practitioner | Admitting: Nurse Practitioner

## 2020-10-05 ENCOUNTER — Other Ambulatory Visit: Payer: Self-pay

## 2020-10-05 DIAGNOSIS — R197 Diarrhea, unspecified: Secondary | ICD-10-CM

## 2020-10-05 DIAGNOSIS — Z8619 Personal history of other infectious and parasitic diseases: Secondary | ICD-10-CM

## 2020-10-05 DIAGNOSIS — R112 Nausea with vomiting, unspecified: Secondary | ICD-10-CM

## 2020-10-06 LAB — GI PROFILE, STOOL, PCR

## 2020-10-12 ENCOUNTER — Other Ambulatory Visit: Payer: Self-pay | Admitting: Physician Assistant

## 2020-10-12 ENCOUNTER — Other Ambulatory Visit: Payer: Self-pay | Admitting: Gastroenterology

## 2020-10-12 ENCOUNTER — Encounter: Payer: Self-pay | Admitting: Emergency Medicine

## 2020-10-12 ENCOUNTER — Other Ambulatory Visit: Payer: Self-pay

## 2020-10-12 ENCOUNTER — Ambulatory Visit
Admission: EM | Admit: 2020-10-12 | Discharge: 2020-10-12 | Disposition: A | Discharge status: Home / Self Care | Payer: Self-pay | Attending: Internal Medicine | Admitting: Internal Medicine

## 2020-10-12 DIAGNOSIS — R1032 Left lower quadrant pain: Secondary | ICD-10-CM | POA: Insufficient documentation

## 2020-10-12 LAB — POCT URINALYSIS DIP (MANUAL ENTRY)
Bilirubin, UA: NEGATIVE
Glucose, UA: NEGATIVE mg/dL
Ketones, POC UA: NEGATIVE mg/dL
Leukocytes, UA: NEGATIVE
Nitrite, UA: NEGATIVE
Protein Ur, POC: NEGATIVE mg/dL
Spec Grav, UA: 1.025 (ref 1.010–1.025)
Urobilinogen, UA: 0.2 E.U./dL
pH, UA: 7 (ref 5.0–8.0)

## 2020-10-12 LAB — POCT URINE PREGNANCY: Preg Test, Ur: NEGATIVE

## 2020-10-12 NOTE — ED Provider Notes (Addendum)
[name]    CSN: [id number] Arrival date & time: 10/12/20  1204      History   Chief Complaint Chief Complaint  Patient presents with  . Urinary Tract Infection  . SEXUALLY TRANSMITTED DISEASE    HPI [name] MCFATE is a 45 y.o. female.   S[name] complains of left lower quadrant pain and left lower back pain that started several days ago.  Reports increased vaginal discharge and odor. She reports she has tested positive for BV several times in the last few months.  Reports h/o ovarian cysts in the past. Denies fever, chills, n/v/d, denies dysuria, or icreased urinary frequency.      Past Medical History:  Diagnosis Date  . Abnormal Pap smear of cervix   . Allergy   . Anemia   . Anxiety   . Arthritis   . Asthma   . Chest pain   . Chronic lower back pain   . Depression   . Fibroid   . GERD (gastroesophageal reflux disease)   . Hypertension   . Migraine   . Migraines   . Neck pain, chronic   . Status post dilation of esophageal narrowing   . STD (sexually transmitted disease)    HSV1, in eye    Patient Active Problem List   Diagnosis Date Noted  . Iron deficiency anemia 10/01/2018  . Thalassemia 10/01/2018  . Galactorrhea 07/19/2018  . Malnutrition of moderate degree ([place]) 07/19/2018  . Abnormal TSH 07/19/2018  . Nummular keratitis of right eye, followed by [facility] 07/19/2018  . Aortic atherosclerosis ([place]) 07/19/2018  . Murmur, cardiac 03/26/2018  . Former smoker, stopped 2019 03/26/2018  . History of hysterectomy 11/25/2016  . Unintentional weight loss 08/17/2016  . B12 deficiency 08/12/2016  . Daily nausea 08/06/2016  . Microcytic anemia 08/06/2016  . Chronic diarrhea 08/06/2016  . Intestinal malabsorption 08/06/2016  . Lung nodule < 6cm on CT 08/06/2016  . Essential hypertension, on Norvasec, Hydralazine, and HCTZ; patient ACE/ARB intolerant 2/2 cough 07/02/2013  . Atypical chest pain 07/02/2013  . Shortness of breath 07/02/2013  . Other  and unspecified ovarian cyst 04/01/2013  . Dysphagia 12/22/2011    Past Surgical History:  Procedure Laterality Date  . COLPOSCOPY    . ESOPHAGEAL DILATION    . ESOPHAGEAL MANOMETRY N/A 04/01/2013   Procedure: ESOPHAGEAL MANOMETRY (EM);  Surgeon: Milus Banister, MD;  Location: [facility];  Service: Endoscopy;  Laterality: N/A;  . ESOPHAGOGASTRODUODENOSCOPY (EGD) WITH ESOPHAGEAL DILATION N/A 02/28/2013   Procedure: ESOPHAGOGASTRODUODENOSCOPY (EGD) WITH ESOPHAGEAL DILATION;  Surgeon: Milus Banister, MD;  Location: [facility];  Service: Endoscopy;  Laterality: N/A;  . TUBAL LIGATION    . VAGINAL HYSTERECTOMY      OB History    Gravida  3   Para  2   Term      Preterm  2   AB  1   Living  1     SAB      IAB  1   Ectopic      Multiple      Live Births  2            Home Medications    Prior to Admission medications   Medication Sig Start Date End Date Taking? Authorizing Provider  albuterol (PROVENTIL) (2.5 MG/3ML) 0.083% nebulizer solution Take 3 mLs (2.5 mg total) by nebulization every 4 (four) hours as needed for wheezing or shortness of breath. 10/04/19   Hall-Potvin, [place], PA-C  amLODipine (  NORVASC) 10 MG tablet Take 1 tablet (10 mg total) by mouth daily. 07/31/19   Inda Coke, PA  carbamazepine (TEGRETOL) 200 MG tablet Take 1 tablet (200 mg total) by mouth daily. 10/22/19   Garald Balding, PA-C  cetirizine (ZYRTEC ALLERGY) 10 MG tablet Take 1 tablet (10 mg total) by mouth daily. 10/04/19   Hall-Potvin, [place], PA-C  diclofenac (VOLTAREN) 50 MG EC tablet Take 1 tablet (50 mg total) by mouth 2 (two) times daily. 11/04/19   Tasia Catchings, Amy V, PA-C  hydrochlorothiazide (HYDRODIURIL) 25 MG tablet Take 1 tablet (25 mg total) by mouth daily. 07/31/19   Inda Coke, PA  Multiple Vitamins-Calcium (ONE-A-DAY WOMENS FORMULA PO) Take 1 tablet by mouth daily.    [provider]  omeprazole (PRILOSEC) 20 MG capsule Take 20 mg by mouth daily.    [provider]  ondansetron (ZOFRAN) 4 MG tablet Take 1 tablet (4 mg total) by mouth every 6 (six) hours as needed for nausea or vomiting. 09/28/20   Noralyn Pick, NP  promethazine (PHENERGAN) 25 MG tablet Take 1 tablet (25 mg total) by mouth 2 (two) times daily as needed for nausea or vomiting. 09/28/20   Noralyn Pick, NP  fluticasone (FLONASE) 50 MCG/ACT nasal spray Place 1 spray into both nostrils daily. 10/04/19 11/04/19  Hall-Potvin, [place], PA-C    Family History Family History  Problem Relation Age of Onset  . Heart disease Father        H/O CABG  . Hypertension Mother   . Hypertension Maternal Grandmother   . Thyroid disease Maternal Grandmother   . Diabetes Maternal Grandmother   . Hypertension Paternal Grandmother   . Diabetes Paternal Grandmother   . Cancer - Lung Paternal Grandmother   . Hypertension Maternal Aunt   . Thyroid disease Maternal Aunt   . Esophageal cancer Other        age unknow  . Colon cancer Neg Hx   . Stomach cancer Neg Hx     Social History Social History   Tobacco Use  . Smoking status: Former Smoker    Packs/day: 0.10    Years: 10.00    Pack years: 1.00    Types: Cigars    Quit date: 12/06/2017    Years since quitting: 2.8  . Smokeless tobacco: Never Used  . Tobacco comment: smokes 2 Black & Milds per day 08/19/13  Vaping Use  . Vaping Use: Never used  Substance Use Topics  . Alcohol use: Not Currently  . Drug use: Not Currently    Types: Marijuana     Allergies   Iodinated diagnostic agents, Reglan [metoclopramide], Amoxicillin, Gabapentin, Pregabalin, Lisinopril, Losartan, and Sulfa antibiotics   Review of Systems Review of Systems  Constitutional: Negative for chills and fever.  HENT: Negative for ear pain and sore throat.   Eyes: Negative for pain and visual disturbance.  Respiratory: Negative for cough and shortness of breath.   Cardiovascular: Negative for chest pain and palpitations.  Gastrointestinal:  Negative for abdominal pain and vomiting.  Genitourinary: Negative for dysuria and hematuria.  Musculoskeletal: Negative for arthralgias and back pain.  Skin: Negative for color change and rash.  Neurological: Negative for seizures and syncope.  All other systems reviewed and are negative.    Physical Exam Triage Vital Signs ED Triage Vitals  Enc Vitals Group     BP 10/12/20 1313 134/85     Pulse Rate 10/12/20 1313 70     Resp 10/12/20 1313 20  Temp 10/12/20 1313 98.2 F (36.8 C)     Temp Source 10/12/20 1313 Oral     SpO2 10/12/20 1313 98 %     Weight --      Height --      Head Circumference --      Peak Flow --      Pain Score 10/12/20 1310 10     Pain Loc --      Pain Edu? --      Excl. in [place]? --    No data found.  Updated Vital Signs BP 134/85 (BP Location: Left Arm)   Pulse 70   Temp 98.2 F (36.8 C) (Oral)   Resp 20   SpO2 98%   Visual Acuity Right Eye Distance:   Left Eye Distance:   Bilateral Distance:    Right Eye Near:   Left Eye Near:    Bilateral Near:     Physical Exam Vitals and nursing note reviewed.  Constitutional:      General: She is not in acute distress.    Appearance: She is well-developed.  HENT:     Head: Normocephalic and atraumatic.  Eyes:     Conjunctiva/sclera: Conjunctivae normal.  Cardiovascular:     Rate and Rhythm: Normal rate and regular rhythm.     Heart sounds: No murmur heard.   Pulmonary:     Effort: Pulmonary effort is normal. No respiratory distress.     Breath sounds: Normal breath sounds.  Abdominal:     General: Bowel sounds are normal.     Palpations: Abdomen is soft.     Tenderness: There is abdominal tenderness in the left lower quadrant.     Comments: Suzanne [name] uncomfortable lying flat, tenderness to LLQ with light palpation.   Musculoskeletal:     Cervical back: Neck supple.  Skin:    General: Skin is warm and dry.  Neurological:     Mental Status: She is alert.      UC Treatments / Results   Labs (all labs ordered are listed, but only abnormal results are displayed) Labs Reviewed  POCT URINALYSIS DIP (MANUAL ENTRY) - Abnormal; Notable for the following components:      Result Value   Blood, UA trace-intact (*)    All other components within normal limits  POCT URINE PREGNANCY  CERVICOVAGINAL ANCILLARY ONLY    EKG   Radiology No results found.  Procedures Procedures (including critical care time)  Medications Ordered in UC Medications - No data to display  Initial Impression / Assessment and Plan / UC Course  I have reviewed the triage vital signs and the nursing notes.  Pertinent labs & imaging results that were available during my care of the patient were reviewed by me and considered in my medical decision making (see chart for details).     [name] with severe left lower quadrant pain.  [name] uncomfortable on exam with tenderness to palpation with light touch.  Recommend further evaluation in the ED for abdominal US/imaging Final Clinical Impressions(s) / UC Diagnoses   Final diagnoses:  LLQ pain     Discharge Instructions     Advised further evaluation of abdominal pain in ED      ED Prescriptions    None     PDMP not reviewed this encounter.   Konrad Felix, PA-C 10/12/20 1708    Konrad Felix, PA-C 10/12/20 1728    Konrad Felix, PA-C 10/15/20 1426

## 2020-10-12 NOTE — ED Notes (Signed)
Patient is being discharged from the Urgent Care and sent to the Emergency Department via pov . Per Janett Billow, pa, patient is in need of higher level of care due to abdominal pain complaints. Patient is aware and verbalizes understanding of plan of care.  Vitals:   10/12/20 1313  BP: 134/85  Pulse: 70  Resp: 20  Temp: 98.2 F (36.8 C)  SpO2: 98%

## 2020-10-12 NOTE — ED Triage Notes (Signed)
Patient has had lower abdominal pain and lower back pain .  Has a vaginal discharge and odor.  Patient took an otc test for uti and was positive.  Patient wants std check, had an experience where condom broke

## 2020-10-12 NOTE — Discharge Instructions (Addendum)
Advised further evaluation of abdominal pain in ED

## 2020-10-13 LAB — CERVICOVAGINAL ANCILLARY ONLY
Bacterial Vaginitis (gardnerella): POSITIVE — AB
Candida Glabrata: NEGATIVE
Candida Vaginitis: NEGATIVE
Chlamydia: NEGATIVE
Comment: NEGATIVE
Comment: NEGATIVE
Comment: NEGATIVE
Comment: NEGATIVE
Comment: NEGATIVE
Comment: NORMAL
Neisseria Gonorrhea: NEGATIVE
Trichomonas: NEGATIVE

## 2020-10-15 ENCOUNTER — Telehealth: Payer: Self-pay

## 2020-10-15 NOTE — Telephone Encounter (Signed)
Patient called states she is having the same symptoms. Requesting a RF on antibiotics to be sent to [facility] on [address].

## 2020-10-15 NOTE — Telephone Encounter (Signed)
Patient called states she is having the same symptoms. Requesting a RF on antibiotics to be sent to [facility] on [address].  Return call to patient regarding phone message from [place].  Patient states she had been constipated for 3 to 4 days.  She took metamucil to help with constipation.  Last night she had diarrhea two times.  Each time she was having diarrhea she felt nauseated.  She took promethazine and felt better.  She has not been able to have a BM today.  Patient is requesting antibiotics to be sent to pharmacy.  She states these are same symptoms she has had before when she has H pylori.  Please advise.

## 2020-10-16 ENCOUNTER — Telehealth (HOSPITAL_COMMUNITY): Payer: Self-pay | Admitting: Emergency Medicine

## 2020-10-16 MED ORDER — DOXYCYCLINE HYCLATE 100 MG PO TABS: 100.0000 mg | ORAL_TABLET | Freq: Two times a day (BID) | ORAL | 0 refills | Status: DC

## 2020-10-16 MED ORDER — OMEPRAZOLE 20 MG PO CPDR: 20.0000 mg | DELAYED_RELEASE_CAPSULE | Freq: Two times a day (BID) | ORAL | 0 refills | Status: DC

## 2020-10-16 MED ORDER — BISMUTH SUBSALICYLATE 262 MG PO CHEW: 524.0000 mg | CHEWABLE_TABLET | Freq: Four times a day (QID) | ORAL | 0 refills | Status: AC

## 2020-10-16 MED ORDER — DOXYCYCLINE HYCLATE 100 MG PO TABS: 100.0000 mg | ORAL_TABLET | Freq: Two times a day (BID) | ORAL | 0 refills | Status: AC

## 2020-10-16 MED ORDER — METRONIDAZOLE 250 MG PO TABS: 250.0000 mg | ORAL_TABLET | Freq: Three times a day (TID) | ORAL | 0 refills | Status: DC

## 2020-10-16 MED ORDER — METRONIDAZOLE 250 MG PO TABS: 250.0000 mg | ORAL_TABLET | Freq: Four times a day (QID) | ORAL | 0 refills | Status: AC

## 2020-10-16 NOTE — Telephone Encounter (Signed)
Patient aware that the following medications have been sent to [place]:  1- Flagyl 250mg  one tablet three times daily x 10 days 2- doxycycline 100mg  one tablet twice daily x 10 days 3-pepto-bismol 262mg  two tablets four times daily x 10 days 4-omeprazole 20mg  one capsule twice daily x 10 days  Patient advised to completely stop NSAIDS per Dr Ardis Hughs' recommendations. Patient advised to contact our office with any further issues. Patient agreed to plan and verbalized understanding.  No further questions.

## 2020-10-16 NOTE — Addendum Note (Signed)
Addended by: Stevan Born on: 10/16/2020 02:47 PM   Modules accepted: Orders

## 2020-10-16 NOTE — Addendum Note (Signed)
Addended by: Stevan Born on: 10/16/2020 02:49 PM   Modules accepted: Orders

## 2020-10-16 NOTE — Telephone Encounter (Signed)
Addendum:  Flagyl 250mg  one tablet four times daily for 10 days.

## 2020-10-16 NOTE — Telephone Encounter (Signed)
Yes, she explained to colleen that she vomited some of her pylera treatment and so definitely a chance she still has h pylori. Ok ot refill pylera 10 days (or the equivalent generic meds).    Also she needs to completely stop NSAIDs, she takes several a day I believe. That is certainly not helping her problems

## 2020-11-16 ENCOUNTER — Inpatient Hospital Stay: Payer: Self-pay | Admitting: Oncology

## 2020-11-16 ENCOUNTER — Inpatient Hospital Stay: Payer: Self-pay

## 2020-11-16 ENCOUNTER — Other Ambulatory Visit: Payer: Self-pay | Admitting: Oncology

## 2020-11-16 ENCOUNTER — Telehealth: Payer: Self-pay | Admitting: Oncology

## 2020-11-16 NOTE — Telephone Encounter (Signed)
Cancelled appt per 6/17 sch msg. Called pt, no answer. Left msg for pt to call back if interested in r/s.

## 2020-11-17 ENCOUNTER — Other Ambulatory Visit: Payer: Self-pay | Admitting: Oncology

## 2020-11-17 NOTE — Progress Notes (Signed)
Karie Mainland  Johnmatthew Solorio, Virgie Dad, MD Hi Dr. Jana Hakim,   Tressa Busman pt had called in to cancel the appointment. I did reach out, but she didn't answer so I left a voicemail for her to call back to r/s the appt.   Scheduling Message  Entered by Guy Sandifer on 11/13/2020 at  3:22 PM  Priority: High  <No visit type provided>  Department: [facility]  Provider: [id number]  Scheduling Notes:  Cancel appts on 6/20   Thanks,  Bethany         Previous Messages    ----- Message -----  From: Chauncey Cruel, MD  Sent: 11/16/2020   9:45 AM EDT  To: Woodward Ku-- I note this pt cancelled her appt; youmention a message 6/17-- where can I find that message?   Thanks!   GM

## 2021-07-17 ENCOUNTER — Other Ambulatory Visit: Payer: Self-pay

## 2021-07-17 ENCOUNTER — Ambulatory Visit
Admission: EM | Admit: 2021-07-17 | Discharge: 2021-07-17 | Disposition: A | Discharge status: Home / Self Care | Payer: Self-pay | Attending: Physician Assistant | Admitting: Physician Assistant

## 2021-07-17 ENCOUNTER — Encounter: Payer: Self-pay | Admitting: Emergency Medicine

## 2021-07-17 DIAGNOSIS — N898 Other specified noninflammatory disorders of vagina: Secondary | ICD-10-CM

## 2021-07-17 LAB — POCT URINALYSIS DIP (MANUAL ENTRY)
Bilirubin, UA: NEGATIVE
Glucose, UA: NEGATIVE mg/dL
Leukocytes, UA: NEGATIVE
Nitrite, UA: NEGATIVE
Spec Grav, UA: 1.02 (ref 1.010–1.025)
Urobilinogen, UA: 0.2 E.U./dL
pH, UA: 6 (ref 5.0–8.0)

## 2021-07-17 LAB — POCT URINE PREGNANCY: Preg Test, Ur: NEGATIVE

## 2021-07-17 NOTE — ED Provider Notes (Signed)
[facility]    CSN: [id number] Arrival date & time: 07/17/21  1103      History   Chief Complaint Chief Complaint  Patient presents with   Dysuria         HPI [name] is a 46 y.o. female.   Patient here today for evaluation of vaginal discharge and dysuria.  She reports that her urine has an odor to it.  She also notes she has had some cramping to her lower abdomen.  She denies any fever.  She has had some nausea as a result of known H. pylori but no other or new nausea or vomiting.  The history is provided by the patient.  Dysuria Associated symptoms: vaginal discharge   Associated symptoms: no abdominal pain, no fever, no nausea and no vomiting    Past Medical History:  Diagnosis Date   Abnormal Pap smear of cervix    Allergy    Anemia    Anxiety    Arthritis    Asthma    Chest pain    Chronic lower back pain    Depression    Fibroid    GERD (gastroesophageal reflux disease)    Hypertension    Migraine    Migraines    Neck pain, chronic    Status post dilation of esophageal narrowing    STD (sexually transmitted disease)    HSV1, in eye    Patient Active Problem List   Diagnosis Date Noted   Iron deficiency anemia 10/01/2018   Thalassemia 10/01/2018   Galactorrhea 07/19/2018   Malnutrition of moderate degree ([place]) 07/19/2018   Abnormal TSH 07/19/2018   Nummular keratitis of right eye, 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/2018   Aortic atherosclerosis ([place]) 07/19/2018   Murmur, cardiac 03/26/2018   Former smoker, stopped 2019 03/26/2018   History of hysterectomy 11/25/2016   Unintentional weight loss 08/17/2016   B12 deficiency 08/12/2016   Daily nausea 08/06/2016   Microcytic anemia 08/06/2016   Chronic diarrhea 08/06/2016   Intestinal malabsorption 08/06/2016   Lung nodule < 6cm on CT 08/06/2016   Essential hypertension, on Norvasec, Hydralazine, and HCTZ; patient ACE/ARB intolerant 2/2 cough 07/02/2013   Atypical chest pain 07/02/2013    Shortness of breath 07/02/2013   Other and unspecified ovarian cyst 04/01/2013   Dysphagia 12/22/2011    Past Surgical History:  Procedure Laterality Date   COLPOSCOPY     ESOPHAGEAL DILATION     ESOPHAGEAL MANOMETRY N/A 04/01/2013   Procedure: ESOPHAGEAL MANOMETRY (EM);  Surgeon: Milus Banister, MD;  [facility];  Service: Endoscopy;  Laterality: N/A;   ESOPHAGOGASTRODUODENOSCOPY (EGD) WITH ESOPHAGEAL DILATION N/A 02/28/2013   Procedure: ESOPHAGOGASTRODUODENOSCOPY (EGD) WITH ESOPHAGEAL DILATION;  Surgeon: Milus Banister, MD;  Location[facility];  Service: Endoscopy;  Laterality: N/A;   TUBAL LIGATION     VAGINAL HYSTERECTOMY      OB History     Gravida  3   Para  2   Term      Preterm  2   AB  1   Living  1      SAB      IAB  1   Ectopic      Multiple      Live Births  2            Home Medications    Prior to Admission medications   Medication Sig Start Date End Date Taking? Authorizing Provider  albuterol (PROVENTIL) (2.5 MG/3ML) 0.083% nebulizer  solution Take 3 mLs (2.5 mg total) by nebulization every 4 (four) hours as needed for wheezing or shortness of breath. 10/04/19   Hall-Potvin, [place], PA-C  amLODipine (NORVASC) 10 MG tablet Take 1 tablet (10 mg total) by mouth daily. 07/31/19   Inda Coke, PA  carbamazepine (TEGRETOL) 200 MG tablet Take 1 tablet (200 mg total) by mouth daily. 10/22/19   Garald Balding, PA-C  cetirizine (ZYRTEC ALLERGY) 10 MG tablet Take 1 tablet (10 mg total) by mouth daily. 10/04/19   Hall-Potvin, [place], PA-C  diclofenac (VOLTAREN) 50 MG EC tablet Take 1 tablet (50 mg total) by mouth 2 (two) times daily. 11/04/19   Tasia Catchings, Amy V, PA-C  hydrochlorothiazide (HYDRODIURIL) 25 MG tablet Take 1 tablet (25 mg total) by mouth daily. 07/31/19   Inda Coke, PA  Multiple Vitamins-Calcium (ONE-A-DAY WOMENS FORMULA PO) Take 1 tablet by mouth daily.    [provider]  omeprazole (PRILOSEC) 20 MG capsule Take 1  capsule (20 mg total) by mouth 2 (two) times daily before a meal for 10 days. 10/16/20 10/26/20  Milus Banister, MD  ondansetron (ZOFRAN) 4 MG tablet Take 1 tablet (4 mg total) by mouth every 6 (six) hours as needed for nausea or vomiting. 09/28/20   Noralyn Pick, NP  promethazine (PHENERGAN) 25 MG tablet Take 1 tablet (25 mg total) by mouth 2 (two) times daily as needed for nausea or vomiting. 09/28/20   Noralyn Pick, NP  fluticasone (FLONASE) 50 MCG/ACT nasal spray Place 1 spray into both nostrils daily. 10/04/19 11/04/19  Hall-Potvin, [place], PA-C    Family History Family History  Problem Relation Age of Onset   Heart disease Father        H/O CABG   Hypertension Mother    Hypertension Maternal Grandmother    Thyroid disease Maternal Grandmother    Diabetes Maternal Grandmother    Hypertension Paternal Grandmother    Diabetes Paternal Grandmother    Cancer - Lung Paternal Grandmother    Hypertension Maternal Aunt    Thyroid disease Maternal Aunt    Esophageal cancer Other        age unknow   Colon cancer Neg Hx    Stomach cancer Neg Hx     Social History Social History   Tobacco Use   Smoking status: Former    Packs/day: 0.10    Years: 10.00    Pack years: 1.00    Types: Cigars, Cigarettes    Quit date: 12/06/2017    Years since quitting: 3.6   Smokeless tobacco: Never   Tobacco comments:    smokes 2 Black & Milds per day 08/19/13  Vaping Use   Vaping Use: Never used  Substance Use Topics   Alcohol use: Not Currently   Drug use: Not Currently    Types: Marijuana     Allergies   Iodinated contrast media, Reglan [metoclopramide], Amoxicillin, Gabapentin, Pregabalin, Lisinopril, Losartan, and Sulfa antibiotics   Review of Systems Review of Systems  Constitutional:  Negative for chills and fever.  Eyes:  Negative for discharge and redness.  Gastrointestinal:  Negative for abdominal pain, nausea and vomiting.  Genitourinary:  Positive for  dysuria and vaginal discharge.    Physical Exam Triage Vital Signs ED Triage Vitals [07/17/21 1302]  Enc Vitals Group     BP (!) 154/104     Pulse Rate 74     Resp 18     Temp 98.1 F (36.7 C)     Temp  Source Oral     SpO2 100 %     Weight      Height      Head Circumference      Peak Flow      Pain Score 4     Pain Loc      Pain Edu?      Excl. in [place]?    No data found.  Updated Vital Signs BP (!) 154/104 (BP Location: Left Arm)    Pulse 74    Temp 98.1 F (36.7 C) (Oral)    Resp 18    SpO2 100%      Physical Exam Vitals and nursing note reviewed.  Constitutional:      General: She is not in acute distress.    Appearance: Normal appearance. She is not ill-appearing.  HENT:     Head: Normocephalic and atraumatic.  Eyes:     Conjunctiva/sclera: Conjunctivae normal.  Cardiovascular:     Rate and Rhythm: Normal rate.  Pulmonary:     Effort: Pulmonary effort is normal.  Neurological:     Mental Status: She is alert.  Psychiatric:        Mood and Affect: Mood normal.        Behavior: Behavior normal.        Thought Content: Thought content normal.     UC Treatments / Results  Labs (all labs ordered are listed, but only abnormal results are displayed) Labs Reviewed  POCT URINALYSIS DIP (MANUAL ENTRY) - Abnormal; Notable for the following components:      Result Value   Ketones, POC UA trace (5) (*)    Blood, UA trace-intact (*)    Protein Ur, POC trace (*)    All other components within normal limits  POCT URINE PREGNANCY  CERVICOVAGINAL ANCILLARY ONLY    EKG   Radiology No results found.  Procedures Procedures (including critical care time)  Medications Ordered in UC Medications - No data to display  Initial Impression / Assessment and Plan / UC Course  I have reviewed the triage vital signs and the nursing notes.  Pertinent labs & imaging results that were available during my care of the patient were reviewed by me and considered in my  medical decision making (see chart for details).    Reassured patient that urine looked clear.  Will order urine culture to definitively rule out UTI.  STD screening ordered.  Will await results for further recommendation but encouraged follow-up with any further concerns or worsening symptoms.  Final Clinical Impressions(s) / UC Diagnoses   Final diagnoses:  Vaginal discharge   Discharge Instructions   None    ED Prescriptions   None    PDMP not reviewed this encounter.   Francene Finders, PA-C 07/17/21 1327

## 2021-07-17 NOTE — ED Triage Notes (Signed)
Pt here for vaginal discharge and dysuria with odor per pt; pt sts some cramping

## 2021-07-20 LAB — CERVICOVAGINAL ANCILLARY ONLY
Bacterial Vaginitis (gardnerella): POSITIVE — AB
Candida Glabrata: NEGATIVE
Candida Vaginitis: NEGATIVE
Chlamydia: NEGATIVE
Comment: NEGATIVE
Comment: NEGATIVE
Comment: NEGATIVE
Comment: NEGATIVE
Comment: NEGATIVE
Comment: NORMAL
Neisseria Gonorrhea: NEGATIVE
Trichomonas: NEGATIVE

## 2021-07-21 ENCOUNTER — Telehealth (HOSPITAL_COMMUNITY): Payer: Self-pay | Admitting: Emergency Medicine

## 2021-07-21 MED ORDER — FLUCONAZOLE 150 MG PO TABS: 150.0000 mg | ORAL_TABLET | Freq: Once | ORAL | 0 refills | Status: AC

## 2021-07-21 MED ORDER — METRONIDAZOLE 500 MG PO TABS: 500.0000 mg | ORAL_TABLET | Freq: Two times a day (BID) | ORAL | 0 refills | Status: DC

## 2021-09-15 ENCOUNTER — Ambulatory Visit
Admission: EM | Admit: 2021-09-15 | Discharge: 2021-09-15 | Disposition: A | Discharge status: Home / Self Care | Payer: Self-pay | Attending: Family Medicine | Admitting: Family Medicine

## 2021-09-15 DIAGNOSIS — N760 Acute vaginitis: Secondary | ICD-10-CM

## 2021-09-15 DIAGNOSIS — I10 Essential (primary) hypertension: Secondary | ICD-10-CM

## 2021-09-15 DIAGNOSIS — M25551 Pain in right hip: Secondary | ICD-10-CM

## 2021-09-15 DIAGNOSIS — B9689 Other specified bacterial agents as the cause of diseases classified elsewhere: Secondary | ICD-10-CM

## 2021-09-15 DIAGNOSIS — B0052 Herpesviral keratitis: Secondary | ICD-10-CM

## 2021-09-15 LAB — POCT URINALYSIS DIP (MANUAL ENTRY)
Bilirubin, UA: NEGATIVE
Glucose, UA: NEGATIVE mg/dL
Ketones, POC UA: NEGATIVE mg/dL
Leukocytes, UA: NEGATIVE
Nitrite, UA: NEGATIVE
Protein Ur, POC: 30 mg/dL — AB
Spec Grav, UA: >=1.03 — AB (ref 1.010–1.025)
Urobilinogen, UA: 0.2 E.U./dL
pH, UA: 6.5 (ref 5.0–8.0)

## 2021-09-15 MED ORDER — CLINDAMYCIN HCL 150 MG PO CAPS: 150.0000 mg | ORAL_CAPSULE | Freq: Four times a day (QID) | ORAL | 0 refills | Status: AC

## 2021-09-15 MED ORDER — ACYCLOVIR 400 MG PO TABS: 400.0000 mg | ORAL_TABLET | Freq: Three times a day (TID) | ORAL | 0 refills | Status: AC

## 2021-09-15 MED ORDER — METRONIDAZOLE 500 MG PO TABS: 500.0000 mg | ORAL_TABLET | Freq: Every day | ORAL | 0 refills | Status: AC

## 2021-09-15 MED ORDER — FLUCONAZOLE 150 MG PO TABS: 150.0000 mg | ORAL_TABLET | ORAL | 0 refills | Status: DC | PRN

## 2021-09-15 MED ORDER — KETOROLAC TROMETHAMINE 10 MG PO TABS: 10.0000 mg | ORAL_TABLET | Freq: Three times a day (TID) | ORAL | 0 refills | Status: DC | PRN

## 2021-09-15 NOTE — ED Provider Notes (Signed)
?[facility] ? ? ? ?CSN: [id number] ?Arrival date & time: 09/15/21  1704 ? ? ?  ? ?History   ?Chief Complaint ?Chief Complaint  ?Patient presents with  ? Abdominal Pain  ? Eye Problem  ? ? ?HPI ?Suzanne [name] is a 46 y.o. female.  ? ?HPI ?Patient here today for evaluation of multiple concerns. ? ?Hypertension ?Patient is hypertensive on arrival however reports she checks her blood pressure routinely at home and has been compliant with her medication.  She endorses that she has not taken her blood pressure medication today. ? ?Vaginitis ?Patient reports that she has a recurrent history of BV and is currently having vaginal odor and irritation.  She reports that she has been treated recurrently with metronidazole however reports that her OB/GYN treated her with metronidazole and clindamycin the last time and this achieved the greatest amount of relief from her having a recurrent flare.  She was seen here over a month ago and treated with metronidazole and reports that symptoms returned within 2 weeks. ? ?HSV Keratitis ?Patient reports a clustery,blistery localized rash on lower right eye. She endorses the lesions feel like pins and needles sensation. She has had HSV previously in her right eye, but denies any inner eye pain or new visual changes. ? ?Right hip and groin pain (not abdominal)  ?Patient works as a Scientist, water quality and reports groin pain immediately below her lower abdomen on the right side.  This pain has been present for greater than a week.  She reports the pain occasionally radiates into her right hip.  Denies any prior injuries related to her hip.  She has not taken any medication for symptoms. ?Past Medical History:  ?Diagnosis Date  ? Abnormal Pap smear of cervix   ? Allergy   ? Anemia   ? Anxiety   ? Arthritis   ? Asthma   ? Chest pain   ? Chronic lower back pain   ? Depression   ? Fibroid   ? GERD (gastroesophageal reflux disease)   ? Hypertension   ? Migraine   ? Migraines   ? Neck pain, chronic    ? Status post dilation of esophageal narrowing   ? STD (sexually transmitted disease)   ? HSV1, in eye  ? ? ?Patient Active Problem List  ? Diagnosis Date Noted  ? Iron deficiency anemia 10/01/2018  ? Thalassemia 10/01/2018  ? Galactorrhea 07/19/2018  ? Malnutrition of moderate degree ([place]) 07/19/2018  ? Abnormal TSH 07/19/2018  ? Nummular keratitis of right eye, 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/2018  ? Aortic atherosclerosis ([place]) 07/19/2018  ? Murmur, cardiac 03/26/2018  ? Former smoker, stopped 2019 03/26/2018  ? History of hysterectomy 11/25/2016  ? Unintentional weight loss 08/17/2016  ? B12 deficiency 08/12/2016  ? Daily nausea 08/06/2016  ? Microcytic anemia 08/06/2016  ? Chronic diarrhea 08/06/2016  ? Intestinal malabsorption 08/06/2016  ? Lung nodule < 6cm on CT 08/06/2016  ? Essential hypertension, on Norvasec, Hydralazine, and HCTZ; patient ACE/ARB intolerant 2/2 cough 07/02/2013  ? Atypical chest pain 07/02/2013  ? Shortness of breath 07/02/2013  ? Other and unspecified ovarian cyst 04/01/2013  ? Dysphagia 12/22/2011  ? ? ?Past Surgical History:  ?Procedure Laterality Date  ? COLPOSCOPY    ? ESOPHAGEAL DILATION    ? ESOPHAGEAL MANOMETRY N/A 04/01/2013  ? Procedure: ESOPHAGEAL MANOMETRY (EM);  Surgeon: Milus Banister, MD;  Location: [facility];  Service: Endoscopy;  Laterality: N/A;  ? ESOPHAGOGASTRODUODENOSCOPY (EGD) WITH ESOPHAGEAL DILATION N/A 02/28/2013  ?  Procedure: ESOPHAGOGASTRODUODENOSCOPY (EGD) WITH ESOPHAGEAL DILATION;  Surgeon: Milus Banister, MD;  Location: [facility];  Service: Endoscopy;  Laterality: N/A;  ? TUBAL LIGATION    ? VAGINAL HYSTERECTOMY    ? ? ?OB History   ? ? Gravida  ?3  ? Para  ?2  ? Term  ?   ? Preterm  ?2  ? AB  ?1  ? Living  ?1  ?  ? ? SAB  ?   ? IAB  ?1  ? Ectopic  ?   ? Multiple  ?   ? Live Births  ?2  ?   ?  ?  ? ? ? ?Home Medications   ? ?Prior to Admission medications   ?Medication Sig Start Date End Date Taking? Authorizing Provider  ?acyclovir (ZOVIRAX) 400 MG tablet  Take 1 tablet (400 mg total) by mouth 3 (three) times daily for 10 days. 09/15/21 09/25/21 Yes Scot Jun, FNP  ?clindamycin (CLEOCIN) 150 MG capsule Take 1 capsule (150 mg total) by mouth every 6 (six) hours for 5 days. 09/15/21 09/20/21 Yes Scot Jun, FNP  ?fluconazole (DIFLUCAN) 150 MG tablet Take 1 tablet (150 mg total) by mouth every three (3) days as needed. 09/15/21  Yes Scot Jun, FNP  ?ketorolac (TORADOL) 10 MG tablet Take 1 tablet (10 mg total) by mouth every 8 (eight) hours as needed. 09/15/21  Yes Scot Jun, FNP  ?metroNIDAZOLE (FLAGYL) 500 MG tablet Take 1 tablet (500 mg total) by mouth daily for 21 days. 09/15/21 10/06/21 Yes Scot Jun, FNP  ?albuterol (PROVENTIL) (2.5 MG/3ML) 0.083% nebulizer solution Take 3 mLs (2.5 mg total) by nebulization every 4 (four) hours as needed for wheezing or shortness of breath. 10/04/19   Hall-Potvin, [place], PA-C  ?amLODipine (NORVASC) 10 MG tablet Take 1 tablet (10 mg total) by mouth daily. 07/31/19   Inda Coke, PA  ?carbamazepine (TEGRETOL) 200 MG tablet Take 1 tablet (200 mg total) by mouth daily. 10/22/19   Garald Balding, PA-C  ?cetirizine (ZYRTEC ALLERGY) 10 MG tablet Take 1 tablet (10 mg total) by mouth daily. 10/04/19   Hall-Potvin, [place], PA-C  ?diclofenac (VOLTAREN) 50 MG EC tablet Take 1 tablet (50 mg total) by mouth 2 (two) times daily. 11/04/19   Tasia Catchings, Amy V, PA-C  ?hydrochlorothiazide (HYDRODIURIL) 25 MG tablet Take 1 tablet (25 mg total) by mouth daily. 07/31/19   Inda Coke, [place]  ?Multiple Vitamins-Calcium (ONE-A-DAY WOMENS FORMULA PO) Take 1 tablet by mouth daily.    [provider]  ?omeprazole (PRILOSEC) 20 MG capsule Take 1 capsule (20 mg total) by mouth 2 (two) times daily before a meal for 10 days. 10/16/20 10/26/20  Milus Banister, MD  ?ondansetron (ZOFRAN) 4 MG tablet Take 1 tablet (4 mg total) by mouth every 6 (six) hours as needed for nausea or vomiting. 09/28/20   Noralyn Pick, NP   ?promethazine (PHENERGAN) 25 MG tablet Take 1 tablet (25 mg total) by mouth 2 (two) times daily as needed for nausea or vomiting. 09/28/20   Noralyn Pick, NP  ?fluticasone (FLONASE) 50 MCG/ACT nasal spray Place 1 spray into both nostrils daily. 10/04/19 11/04/19  Hall-Potvin, [place], PA-C  ? ? ?Family History ?Family History  ?Problem Relation Age of Onset  ? Heart disease Father   ?     H/O CABG  ? Hypertension Mother   ? Hypertension Maternal Grandmother   ? Thyroid disease Maternal Grandmother   ? Diabetes Maternal Grandmother   ?  Hypertension Paternal Grandmother   ? Diabetes Paternal Grandmother   ? Cancer - Lung Paternal Grandmother   ? Hypertension Maternal Aunt   ? Thyroid disease Maternal Aunt   ? Esophageal cancer Other   ?     age unknow  ? Colon cancer Neg Hx   ? Stomach cancer Neg Hx   ? ? ?Social History ?Social History  ? ?Tobacco Use  ? Smoking status: Former  ?  Packs/day: 0.10  ?  Years: 10.00  ?  Pack years: 1.00  ?  Types: Cigars, Cigarettes  ?  Quit date: 12/06/2017  ?  Years since quitting: 3.7  ? Smokeless tobacco: Never  ? Tobacco comments:  ?  smokes 2 Black & Milds per day 08/19/13  ?Vaping Use  ? Vaping Use: Never used  ?Substance Use Topics  ? Alcohol use: Not Currently  ? Drug use: Not Currently  ?  Types: Marijuana  ? ? ? ?Allergies   ?Iodinated contrast media, Reglan [metoclopramide], Amoxicillin, Gabapentin, Pregabalin, Lisinopril, Losartan, and Sulfa antibiotics ? ? ?Review of Systems ?Review of Systems ?Pertinent negatives listed in HPI  ?Physical Exam ?Triage Vital Signs ?ED Triage Vitals  ?Enc Vitals Group  ?   BP 09/15/21 1744 (!) 192/110  ?   Pulse Rate 09/15/21 1744 71  ?   Resp 09/15/21 1744 17  ?   Temp 09/15/21 1744 98.2 ?F (36.8 ?C)  ?   Temp Source 09/15/21 1744 Oral  ?   SpO2 09/15/21 1744 99 %  ?   Weight --   ?   Height --   ?   Head Circumference --   ?   Peak Flow --   ?   Pain Score 09/15/21 1743 8  ?   Pain Loc --   ?   Pain Edu? --   ?   Excl. in [place]? --    ? ?No data found. ? ?Updated Vital Signs ?BP (!) 192/110 (BP Location: Left Arm)   Pulse 71   Temp 98.2 ?F (36.8 ?C) (Oral)   Resp 17   SpO2 99%  ? ?Visual Acuity ?Right Eye Distance:   ?Left Eye Distance:

## 2021-09-15 NOTE — ED Triage Notes (Signed)
Pt presents with c/o abdominal pain and states she feels a pins and needles pain in her R eye.  ?

## 2021-09-15 NOTE — Discharge Instructions (Signed)
Complete clindamycin then start metronidazole for recurrent BV.  I have also prescribed you Diflucan a total of 5 tablets she may take 1 every 3 days as needed for vaginal itching and irritation.  Would recommend to follow-up with your gynecologist if BV continues to recur.  I prescribed you Toradol for hip pain.  Acyclovir 4 HSV on your face.  Continue to monitor your blood pressure at home as it is very elevated here in clinic today.  Take blood pressure medication as soon as you get home. ?

## 2021-12-20 ENCOUNTER — Encounter: Payer: Self-pay | Admitting: Oncology

## 2021-12-20 ENCOUNTER — Ambulatory Visit
Admission: EM | Admit: 2021-12-20 | Discharge: 2021-12-20 | Disposition: A | Discharge status: Home / Self Care | Payer: Managed Care, Other (non HMO) | Attending: Internal Medicine | Admitting: Internal Medicine

## 2021-12-20 DIAGNOSIS — M25552 Pain in left hip: Secondary | ICD-10-CM | POA: Insufficient documentation

## 2021-12-20 DIAGNOSIS — N898 Other specified noninflammatory disorders of vagina: Secondary | ICD-10-CM | POA: Insufficient documentation

## 2021-12-20 LAB — POCT URINALYSIS DIP (MANUAL ENTRY)
Bilirubin, UA: NEGATIVE
Glucose, UA: NEGATIVE mg/dL
Ketones, POC UA: NEGATIVE mg/dL
Leukocytes, UA: NEGATIVE
Nitrite, UA: NEGATIVE
Protein Ur, POC: NEGATIVE mg/dL
Spec Grav, UA: 1.02 (ref 1.010–1.025)
Urobilinogen, UA: 0.2 E.U./dL
pH, UA: 6 (ref 5.0–8.0)

## 2021-12-20 MED ORDER — CLINDAMYCIN HCL 150 MG PO CAPS: 300.0000 mg | ORAL_CAPSULE | Freq: Two times a day (BID) | ORAL | 0 refills | Status: AC

## 2021-12-20 NOTE — ED Triage Notes (Signed)
Patient presents to Urgent Care with complaints of L flank pain and vaginal discharge  and irritation since 4 days ago. Patient reports she is not currently sexually active .

## 2021-12-20 NOTE — ED Provider Notes (Signed)
[name]    CSN: [id number] Arrival date & time: 12/20/21  1528      History   Chief Complaint Chief Complaint  Patient presents with   Back Pain    HPI Suzanne [name] is a 46 y.o. female.   Patient presents with 2 different chief complaints today.  She reports a white vaginal discharge and vaginal irritation that started about 4 days ago.  Denies dysuria, urinary frequency, back pain, fever, abdominal pain, abnormal vaginal bleeding.  Patient reports that she has had a hysterectomy so there is no chance of pregnancy.  Patient also denies that she is sexually active so there is no concern for STD.  Patient reports history of bacterial vaginosis.  She is followed by gynecology for this.  She states that she sometimes has to take Flagyl and clindamycin together.  Patient reports left-sided hip/groin pain that started about 2 days ago.  Patient has been seen previously for this hip and groin pain and was given anti-inflammatory medications with some improvement pain.  Denies any obvious injury.  Patient states that pain shoots down leg.  Patient has not taken any medications for pain. Denies any numbness or tingling.   Patient also has significantly elevated blood pressure reading.  She takes amlodipine 10 mg and hydrochlorothiazide 25 mg daily.  She has taken her medications as prescribed.  She states that she checks her blood pressure at home every so often and it is typically 626 systolic.  It has been a few months since she has seen her PCP.  Denies headache, chest pain, blurred vision, dizziness, shortness of breath, nausea, vomiting.   Back Pain   Past Medical History:  Diagnosis Date   Abnormal Pap smear of cervix    Allergy    Anemia    Anxiety    Arthritis    Asthma    Chest pain    Chronic lower back pain    Depression    Fibroid    GERD (gastroesophageal reflux disease)    Hypertension    Migraine    Migraines    Neck pain, chronic    Status post  dilation of esophageal narrowing    STD (sexually transmitted disease)    HSV1, in eye    Patient Active Problem List   Diagnosis Date Noted   Iron deficiency anemia 10/01/2018   Thalassemia 10/01/2018   Galactorrhea 07/19/2018   Malnutrition of moderate degree ([place]) 07/19/2018   Abnormal TSH 07/19/2018   Nummular keratitis of right eye, 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/2018   Aortic atherosclerosis ([place]) 07/19/2018   Murmur, cardiac 03/26/2018   Former smoker, stopped 2019 03/26/2018   History of hysterectomy 11/25/2016   Unintentional weight loss 08/17/2016   B12 deficiency 08/12/2016   Daily nausea 08/06/2016   Microcytic anemia 08/06/2016   Chronic diarrhea 08/06/2016   Intestinal malabsorption 08/06/2016   Lung nodule < 6cm on CT 08/06/2016   Essential hypertension, on Norvasec, Hydralazine, and HCTZ; patient ACE/ARB intolerant 2/2 cough 07/02/2013   Atypical chest pain 07/02/2013   Shortness of breath 07/02/2013   Other and unspecified ovarian cyst 04/01/2013   Dysphagia 12/22/2011    Past Surgical History:  Procedure Laterality Date   COLPOSCOPY     ESOPHAGEAL DILATION     ESOPHAGEAL MANOMETRY N/A 04/01/2013   Procedure: ESOPHAGEAL MANOMETRY (EM);  Surgeon: Milus Banister, MD;  Location: [facility];  Service: Endoscopy;  Laterality: N/A;   ESOPHAGOGASTRODUODENOSCOPY (EGD) WITH ESOPHAGEAL DILATION N/A 02/28/2013  Procedure: ESOPHAGOGASTRODUODENOSCOPY (EGD) WITH ESOPHAGEAL DILATION;  Surgeon: Milus Banister, MD;  Location: [facility];  Service: Endoscopy;  Laterality: N/A;   TUBAL LIGATION     VAGINAL HYSTERECTOMY      OB History     Gravida  3   Para  2   Term      Preterm  2   AB  1   Living  1      SAB      IAB  1   Ectopic      Multiple      Live Births  2            Home Medications    Prior to Admission medications   Medication Sig Start Date End Date Taking? Authorizing Provider  amLODipine (NORVASC) 10 MG tablet Take 1 tablet (10  mg total) by mouth daily. 07/31/19  Yes Inda Coke, PA  cetirizine (ZYRTEC ALLERGY) 10 MG tablet Take 1 tablet (10 mg total) by mouth daily. 10/04/19  Yes Hall-Potvin, [place], PA-C  clindamycin (CLEOCIN) 150 MG capsule Take 2 capsules (300 mg total) by mouth every 12 (twelve) hours for 7 days. 12/20/21 12/27/21 Yes , Michele Rockers, FNP  hydrochlorothiazide (HYDRODIURIL) 25 MG tablet Take 1 tablet (25 mg total) by mouth daily. 07/31/19  Yes Inda Coke, PA  Multiple Vitamins-Calcium (ONE-A-DAY WOMENS FORMULA PO) Take 1 tablet by mouth daily.   Yes [provider]  albuterol (PROVENTIL) (2.5 MG/3ML) 0.083% nebulizer solution Take 3 mLs (2.5 mg total) by nebulization every 4 (four) hours as needed for wheezing or shortness of breath. 10/04/19   Hall-Potvin, [place], PA-C  carbamazepine (TEGRETOL) 200 MG tablet Take 1 tablet (200 mg total) by mouth daily. 10/22/19   Garald Balding, PA-C  diclofenac (VOLTAREN) 50 MG EC tablet Take 1 tablet (50 mg total) by mouth 2 (two) times daily. Patient not taking: Reported on 12/20/2021 11/04/19   Ok Edwards, PA-C  fluconazole (DIFLUCAN) 150 MG tablet Take 1 tablet (150 mg total) by mouth every three (3) days as needed. 09/15/21   Scot Jun, FNP  ketorolac (TORADOL) 10 MG tablet Take 1 tablet (10 mg total) by mouth every 8 (eight) hours as needed. 09/15/21   Scot Jun, FNP  omeprazole (PRILOSEC) 20 MG capsule Take 1 capsule (20 mg total) by mouth 2 (two) times daily before a meal for 10 days. 10/16/20 10/26/20  Milus Banister, MD  ondansetron (ZOFRAN) 4 MG tablet Take 1 tablet (4 mg total) by mouth every 6 (six) hours as needed for nausea or vomiting. 09/28/20   Noralyn Pick, NP  promethazine (PHENERGAN) 25 MG tablet Take 1 tablet (25 mg total) by mouth 2 (two) times daily as needed for nausea or vomiting. 09/28/20   Noralyn Pick, NP  fluticasone (FLONASE) 50 MCG/ACT nasal spray Place 1 spray into both nostrils daily. 10/04/19  11/04/19  Hall-Potvin, [place], PA-C    Family History Family History  Problem Relation Age of Onset   Heart disease Father        H/O CABG   Hypertension Mother    Hypertension Maternal Grandmother    Thyroid disease Maternal Grandmother    Diabetes Maternal Grandmother    Hypertension Paternal Grandmother    Diabetes Paternal Grandmother    Cancer - Lung Paternal Grandmother    Hypertension Maternal Aunt    Thyroid disease Maternal Aunt    Esophageal cancer Other        age  unknow   Colon cancer Neg Hx    Stomach cancer Neg Hx     Social History Social History   Tobacco Use   Smoking status: Former    Packs/day: 0.10    Years: 10.00    Total pack years: 1.00    Types: Cigars, Cigarettes    Quit date: 12/06/2017    Years since quitting: 4.0   Smokeless tobacco: Never   Tobacco comments:    smokes 2 Black & Milds per day 08/19/13  Vaping Use   Vaping Use: Never used  Substance Use Topics   Alcohol use: Not Currently   Drug use: Not Currently    Types: Marijuana     Allergies   Iodinated contrast media, Reglan [metoclopramide], Amoxicillin, Gabapentin, Pregabalin, Lisinopril, Losartan, and Sulfa antibiotics   Review of Systems Review of Systems Per HPI  Physical Exam Triage Vital Signs ED Triage Vitals  Enc Vitals Group     BP 12/20/21 1611 (!) 191/119     Pulse Rate 12/20/21 1611 65     Resp 12/20/21 1611 18     Temp 12/20/21 1611 98.2 F (36.8 C)     Temp src --      SpO2 12/20/21 1611 98 %     Weight --      Height --      Head Circumference --      Peak Flow --      Pain Score 12/20/21 1609 8     Pain Loc --      Pain Edu? --      Excl. in [place]? --    No data found.  Updated Vital Signs BP (!) 191/119   Pulse 65   Temp 98.2 F (36.8 C)   Resp 18   SpO2 98%   Visual Acuity Right Eye Distance:   Left Eye Distance:   Bilateral Distance:    Right Eye Near:   Left Eye Near:    Bilateral Near:     Physical Exam Constitutional:       General: She is not in acute distress.    Appearance: Normal appearance. She is not toxic-appearing or diaphoretic.  HENT:     Head: Normocephalic and atraumatic.  Eyes:     Extraocular Movements: Extraocular movements intact.     Conjunctiva/sclera: Conjunctivae normal.  Cardiovascular:     Rate and Rhythm: Normal rate and regular rhythm.     Pulses: Normal pulses.     Heart sounds: Normal heart sounds.  Pulmonary:     Effort: Pulmonary effort is normal. No respiratory distress.     Breath sounds: Normal breath sounds.  Abdominal:     General: Bowel sounds are normal. There is no distension.     Palpations: Abdomen is soft.     Tenderness: There is no abdominal tenderness.  Musculoskeletal:     Comments: Tenderness to palpation to left lateral hip.  No tenderness to groin.  There is some mild tenderness that radiates up left side and into left lower lumbar region.  No direct spinal tenderness, crepitus, step-off.  She is able to stand.  Has full range of motion of hip.  Neurovascular intact.  Neurological:     General: No focal deficit present.     Mental Status: She is alert and oriented to person, place, and time. Mental status is at baseline.     Cranial Nerves: Cranial nerves 2-12 are intact.     Sensory: Sensation is intact.  Motor: Motor function is intact.     Coordination: Coordination is intact.     Gait: Gait is intact.  Psychiatric:        Mood and Affect: Mood normal.        Behavior: Behavior normal.        Thought Content: Thought content normal.        Judgment: Judgment normal.      UC Treatments / Results  Labs (all labs ordered are listed, but only abnormal results are displayed) Labs Reviewed  POCT URINALYSIS DIP (MANUAL ENTRY) - Abnormal; Notable for the following components:      Result Value   Blood, UA small (*)    All other components within normal limits  CERVICOVAGINAL ANCILLARY ONLY    EKG   Radiology No results  found.  Procedures Procedures (including critical care time)  Medications Ordered in UC Medications - No data to display  Initial Impression / Assessment and Plan / UC Course  I have reviewed the triage vital signs and the nursing notes.  Pertinent labs & imaging results that were available during my care of the patient were reviewed by me and considered in my medical decision making (see chart for details).     Suspect vaginal discharge is related to patient's recurrent bacterial vaginosis.  Cervicovaginal swab pending.  Will treat with clindamycin given patient states symptoms are typically refractory to Flagyl.  Will await results for any further treatment.  Cervicovaginal swab to test for BV and yeast given patient has no concern for STDs.  Low concern that left hip/groin pain and vaginal discharge is related given that hip pain appears to be musculoskeletal in nature on exam.  Low concern for PID as well given duration of symptoms and no other associated clinical signs and symptoms of PID.  Therefore, will defer vaginal exam.  Offered patient muscle relaxer for left hip musculoskeletal pain but she stated that she has some at home so encouraged patient to take these.  Encouraged ice application as well.  Do not think imaging is necessary given no obvious injury.  Hip pain appears to be possible chronic musculoskeletal pain given that she was seen in April for it as well.  There is a small possibility it could be related to bacterial vaginosis although low suspicion.  Will avoid NSAIDs given patient has significantly elevated blood pressure reading.  Discussed supportive care for pain with patient.  Advised patient of strict return and ER precautions.  Also encouraged gynecology follow-up given return of bacterial vaginosis.    Given that patient's current blood pressure is not very far off from baseline blood pressure and that pain can be contributing to blood pressure, do not think emergent  evaluation at the hospital is necessary.  Will attempt to get pain under control with muscle relaxer and patient was encouraged to monitor blood pressure very closely at home.  Patient advised to follow-up with PCP if it remains elevated.  Patient advised to go to the ER if she develops signs of hypertensive urgency.  No current signs of hypertensive urgency on exam and neuro exam is normal as well as no signs of endorgan damage.  Patient verbalized understanding and was agreeable with plan. Final Clinical Impressions(s) / UC Diagnoses   Final diagnoses:  Left hip pain  Vaginal discharge     Discharge Instructions      Vaginal swab is pending.  You are being treated with clindamycin for bacterial vaginosis with antibiotic.  Recommend  that you take muscle relaxer for your left hip pain.  You may want to avoid ibuprofen, Advil, Aleve given elevated of blood pressure reading.  Also recommend ice application to affected area of pain.  We will call if results are positive for vaginal swab.  Monitor your blood pressure at home.  If it remains elevated, please follow-up with primary care doctor.  Recommend that you go to the hospital if it is elevated with associated headache, dizziness, blurred vision, chest pain, shortness of breath.    ED Prescriptions     Medication Sig Dispense Auth. Provider   clindamycin (CLEOCIN) 150 MG capsule Take 2 capsules (300 mg total) by mouth every 12 (twelve) hours for 7 days. 28 capsule [place], Michele Rockers, [place]      PDMP not reviewed this encounter.   Teodora Medici, [place] 12/20/21 [phone]

## 2021-12-20 NOTE — Discharge Instructions (Addendum)
Vaginal swab is pending.  You are being treated with clindamycin for bacterial vaginosis with antibiotic.  Recommend that you take muscle relaxer for your left hip pain.  You may want to avoid ibuprofen, Advil, Aleve given elevated of blood pressure reading.  Also recommend ice application to affected area of pain.  We will call if results are positive for vaginal swab.  Monitor your blood pressure at home.  If it remains elevated, please follow-up with primary care doctor.  Recommend that you go to the hospital if it is elevated with associated headache, dizziness, blurred vision, chest pain, shortness of breath.

## 2021-12-21 LAB — CERVICOVAGINAL ANCILLARY ONLY
Bacterial Vaginitis (gardnerella): NEGATIVE
Candida Glabrata: NEGATIVE
Candida Vaginitis: NEGATIVE
Comment: NEGATIVE
Comment: NEGATIVE
Comment: NEGATIVE

## 2021-12-22 ENCOUNTER — Other Ambulatory Visit: Payer: Self-pay | Admitting: *Deleted

## 2021-12-22 ENCOUNTER — Other Ambulatory Visit: Payer: Self-pay | Admitting: Physician Assistant

## 2021-12-22 ENCOUNTER — Other Ambulatory Visit: Payer: Self-pay | Admitting: Family Medicine

## 2021-12-22 DIAGNOSIS — I10 Essential (primary) hypertension: Secondary | ICD-10-CM

## 2022-02-10 ENCOUNTER — Other Ambulatory Visit: Payer: Self-pay

## 2022-02-10 ENCOUNTER — Emergency Department (HOSPITAL_COMMUNITY): Payer: Managed Care, Other (non HMO)

## 2022-02-10 ENCOUNTER — Emergency Department (HOSPITAL_COMMUNITY)
Admission: EM | Admit: 2022-02-10 | Discharge: 2022-02-10 | Disposition: A | Discharge status: Home / Self Care | Payer: Managed Care, Other (non HMO) | Attending: Emergency Medicine | Admitting: Emergency Medicine

## 2022-02-10 ENCOUNTER — Encounter (HOSPITAL_COMMUNITY): Payer: Self-pay | Admitting: Emergency Medicine

## 2022-02-10 DIAGNOSIS — N898 Other specified noninflammatory disorders of vagina: Secondary | ICD-10-CM | POA: Diagnosis not present

## 2022-02-10 DIAGNOSIS — Z20822 Contact with and (suspected) exposure to covid-19: Secondary | ICD-10-CM | POA: Insufficient documentation

## 2022-02-10 DIAGNOSIS — R0789 Other chest pain: Secondary | ICD-10-CM | POA: Diagnosis not present

## 2022-02-10 DIAGNOSIS — J069 Acute upper respiratory infection, unspecified: Secondary | ICD-10-CM | POA: Diagnosis not present

## 2022-02-10 DIAGNOSIS — R059 Cough, unspecified: Secondary | ICD-10-CM | POA: Diagnosis present

## 2022-02-10 LAB — URINALYSIS, ROUTINE W REFLEX MICROSCOPIC
Bilirubin Urine: NEGATIVE
Glucose, UA: NEGATIVE mg/dL
Hgb urine dipstick: NEGATIVE
Ketones, ur: NEGATIVE mg/dL
Leukocytes,Ua: NEGATIVE
Nitrite: NEGATIVE
Protein, ur: NEGATIVE mg/dL
Specific Gravity, Urine: 1.01 (ref 1.005–1.030)
pH: 6 (ref 5.0–8.0)

## 2022-02-10 LAB — TROPONIN I (HIGH SENSITIVITY)
Troponin I (High Sensitivity): 4 ng/L (ref <18)
Troponin I (High Sensitivity): 4 ng/L (ref <18)

## 2022-02-10 LAB — WET PREP, GENITAL
Clue Cells Wet Prep HPF POC: NONE SEEN
Sperm: NONE SEEN
Trich, Wet Prep: NONE SEEN
WBC, Wet Prep HPF POC: <10 (ref <10)
Yeast Wet Prep HPF POC: NONE SEEN

## 2022-02-10 LAB — CBC
HCT: 36.6 % (ref 36.0–46.0)
Hemoglobin: 11.4 g/dL — ABNORMAL LOW (ref 12.0–15.0)
MCH: 23.7 pg — ABNORMAL LOW (ref 26.0–34.0)
MCHC: 31.1 g/dL (ref 30.0–36.0)
MCV: 75.9 fL — ABNORMAL LOW (ref 80.0–100.0)
Platelets: 233 10*3/uL (ref 150–400)
RBC: 4.82 MIL/uL (ref 3.87–5.11)
RDW: 15.7 % — ABNORMAL HIGH (ref 11.5–15.5)
WBC: 6.9 10*3/uL (ref 4.0–10.5)
nRBC: 0 % (ref 0.0–0.2)

## 2022-02-10 LAB — BASIC METABOLIC PANEL
Anion gap: 5 (ref 5–15)
BUN: 10 mg/dL (ref 6–20)
CO2: 29 mmol/L (ref 22–32)
Calcium: 9.1 mg/dL (ref 8.9–10.3)
Chloride: 107 mmol/L (ref 98–111)
Creatinine, Ser: 0.59 mg/dL (ref 0.44–1.00)
GFR, Estimated: >60 mL/min (ref >60)
Glucose, Bld: 81 mg/dL (ref 70–99)
Potassium: 3.7 mmol/L (ref 3.5–5.1)
Sodium: 141 mmol/L (ref 135–145)

## 2022-02-10 LAB — SARS CORONAVIRUS 2 BY RT PCR: SARS Coronavirus 2 by RT PCR: NEGATIVE

## 2022-02-10 MED ORDER — IPRATROPIUM-ALBUTEROL 0.5-2.5 (3) MG/3ML IN SOLN
3.0000 mL | Freq: Once | RESPIRATORY_TRACT | Status: AC
  Administered 2022-02-10: 3 mL via RESPIRATORY_TRACT
  Filled 2022-02-10: qty 3

## 2022-02-10 MED ORDER — ALBUTEROL SULFATE (2.5 MG/3ML) 0.083% IN NEBU: 2.5000 mg | INHALATION_SOLUTION | Freq: Four times a day (QID) | RESPIRATORY_TRACT | 12 refills | Status: DC | PRN

## 2022-02-10 NOTE — ED Triage Notes (Signed)
Pt here from home with c/o chest pain cough and congestion , since Monday . Pt also wants checked for std due to a condom breaking during sex on a business trip out of town on Monday also

## 2022-02-10 NOTE — ED Provider Notes (Signed)
[place] DEPT Provider Note   CSN: [id number] Arrival date & time: 02/10/22  0856     History  Chief Complaint  Patient presents with   Chest Pain    Suzanne [name] is a 46 y.o. female who presents emergency department for evaluation of chest pain and flulike symptoms that started about 4 days ago.  Patient states that she was in [place] over the weekend walking around town, and her symptoms started shortly after she returned.  She describes the chest pain as tightness.  Symptoms include sore throat, nonproductive cough, congestion sore throat.  She has been using her albuterol nebulizer at home without significant relief.  She denies wheezing.  Additionally, patient states that she had sex over the weekend and the condom broke.  Since then she has had some vaginal discomfort and is concerned about possible infection.  She had a hysterectomy.  She denies dysuria, hematuria, abdominal pain, nausea, vomiting and diarrhea.   Chest Pain Associated symptoms: cough   Associated symptoms: no abdominal pain, no fever, no headache, no nausea, no shortness of breath and no vomiting        Home Medications Prior to Admission medications   Medication Sig Start Date End Date Taking? Authorizing Provider  albuterol (PROVENTIL) (2.5 MG/3ML) 0.083% nebulizer solution Take 3 mLs (2.5 mg total) by nebulization every 6 (six) hours as needed for wheezing or shortness of breath. 02/10/22  Yes Kathe Becton R, PA-C  albuterol (PROVENTIL) (2.5 MG/3ML) 0.083% nebulizer solution Take 3 mLs (2.5 mg total) by nebulization every 4 (four) hours as needed for wheezing or shortness of breath. 10/04/19   Hall-Potvin, [place], PA-C  amLODipine (NORVASC) 10 MG tablet Take 1 tablet (10 mg total) by mouth daily. 07/31/19   Inda Coke, PA  carbamazepine (TEGRETOL) 200 MG tablet Take 1 tablet (200 mg total) by mouth daily. 10/22/19   Garald Balding, PA-C  cetirizine (ZYRTEC ALLERGY) 10  MG tablet Take 1 tablet (10 mg total) by mouth daily. 10/04/19   Hall-Potvin, [place], PA-C  diclofenac (VOLTAREN) 50 MG EC tablet Take 1 tablet (50 mg total) by mouth 2 (two) times daily. Patient not taking: Reported on 12/20/2021 11/04/19   Ok Edwards, PA-C  fluconazole (DIFLUCAN) 150 MG tablet Take 1 tablet (150 mg total) by mouth every three (3) days as needed. 09/15/21   Scot Jun, FNP  hydrochlorothiazide (HYDRODIURIL) 25 MG tablet Take 1 tablet (25 mg total) by mouth daily. 07/31/19   Inda Coke, PA  ketorolac (TORADOL) 10 MG tablet Take 1 tablet (10 mg total) by mouth every 8 (eight) hours as needed. 09/15/21   Scot Jun, FNP  Multiple Vitamins-Calcium (ONE-A-DAY WOMENS FORMULA PO) Take 1 tablet by mouth daily.    [provider]  omeprazole (PRILOSEC) 20 MG capsule Take 1 capsule (20 mg total) by mouth 2 (two) times daily before a meal for 10 days. 10/16/20 10/26/20  Milus Banister, MD  ondansetron (ZOFRAN) 4 MG tablet Take 1 tablet (4 mg total) by mouth every 6 (six) hours as needed for nausea or vomiting. 09/28/20   Noralyn Pick, NP  promethazine (PHENERGAN) 25 MG tablet Take 1 tablet (25 mg total) by mouth 2 (two) times daily as needed for nausea or vomiting. 09/28/20   Noralyn Pick, NP  fluticasone (FLONASE) 50 MCG/ACT nasal spray Place 1 spray into both nostrils daily. 10/04/19 11/04/19  Hall-Potvin, [place], PA-C      Allergies  Iodinated contrast media, Reglan [metoclopramide], Amoxicillin, Gabapentin, Pregabalin, Lisinopril, Losartan, and Sulfa antibiotics    Review of Systems   Review of Systems  Constitutional:  Negative for fever.  HENT:  Positive for congestion. Negative for dental problem.   Respiratory:  Positive for cough and chest tightness. Negative for shortness of breath.   Cardiovascular:  Negative for chest pain.  Gastrointestinal:  Negative for abdominal pain, diarrhea, nausea and vomiting.  Genitourinary:  Positive for  vaginal discharge and vaginal pain. Negative for dysuria and hematuria.  Neurological:  Negative for headaches.    Physical Exam Updated Vital Signs BP (!) 181/97   Pulse 74   Temp 97.9 F (36.6 C)   Resp 16   Ht '5\' 4"'$  (1.626 m)   Wt 82.6 kg   SpO2 99%   BMI 31.24 kg/m  Physical Exam Vitals and nursing note reviewed.  Constitutional:      General: She is not in acute distress.    Appearance: She is ill-appearing.  HENT:     Head: Atraumatic.     Right Ear: Tympanic membrane normal.     Left Ear: Tympanic membrane normal.     Nose: Congestion present.     Right Turbinates: Swollen.     Left Turbinates: Swollen.     Mouth/Throat:     Pharynx: Uvula midline. Posterior oropharyngeal erythema present. No oropharyngeal exudate.     Tonsils: No tonsillar exudate.  Eyes:     Conjunctiva/sclera: Conjunctivae normal.  Cardiovascular:     Rate and Rhythm: Normal rate and regular rhythm.     Pulses: Normal pulses.     Heart sounds: No murmur heard. Pulmonary:     Effort: Pulmonary effort is normal. No respiratory distress.     Breath sounds: Normal breath sounds.  Abdominal:     General: Abdomen is flat. There is no distension.     Palpations: Abdomen is soft.     Tenderness: There is no abdominal tenderness.  Genitourinary:    Comments: Vaginal canal is nonerythematous and without lesions.  Negative CMT.  Moderate amounts of yellow discharge. Musculoskeletal:        General: Normal range of motion.     Cervical back: Normal range of motion.  Skin:    General: Skin is warm and dry.     Capillary Refill: Capillary refill takes less than 2 seconds.  Neurological:     General: No focal deficit present.     Mental Status: She is alert.  Psychiatric:        Mood and Affect: Mood normal.     ED Results / Procedures / Treatments   Labs (all labs ordered are listed, but only abnormal results are displayed) Labs Reviewed  CBC - Abnormal; Notable for the following  components:      Result Value   Hemoglobin 11.4 (*)    MCV 75.9 (*)    MCH 23.7 (*)    RDW 15.7 (*)    All other components within normal limits  SARS CORONAVIRUS 2 BY RT PCR  WET PREP, GENITAL  BASIC METABOLIC PANEL  URINALYSIS, ROUTINE W REFLEX MICROSCOPIC  GC/CHLAMYDIA PROBE AMP ([place]) NOT AT [facility]  TROPONIN I (HIGH SENSITIVITY)  TROPONIN I (HIGH SENSITIVITY)    EKG None  Radiology DG Chest 2 View  Result Date: 02/10/2022 CLINICAL DATA:  Cough and congestion for 3 days EXAM: CHEST - 2 VIEW COMPARISON:  November 18, 2019 chest radiograph FINDINGS: No pleural effusion. No pneumothorax. Unchanged  cardiac and mediastinal contours. No focal airspace opacity. No acute osseous abnormality. Gaseous distended stomach. Visualized upper abdomen is otherwise unremarkable. IMPRESSION: No active cardiopulmonary disease. Electronically Signed   By: Marin Roberts M.D.   On: 02/10/2022 09:29    Procedures Procedures    Medications Ordered in ED Medications  ipratropium-albuterol (DUONEB) 0.5-2.5 (3) MG/3ML nebulizer solution 3 mL (3 mLs Nebulization Given 02/10/22 1211)    ED Course/ Medical Decision Making/ A&P                           Medical Decision Making Amount and/or Complexity of Data Reviewed Labs: ordered. Radiology: ordered.  Risk Prescription drug management.   Social determinants of health:  Social History   Socioeconomic History   Marital status: Married    Spouse name: Kendrick   Number of children: 2   Years of education: College   Highest education level: Not on file  Occupational History   Occupation: food Public house manager: OTHER    Comment: [employer]  Tobacco Use   Smoking status: Former    Packs/day: 0.10    Years: 10.00    Total pack years: 1.00    Types: Cigars, Cigarettes    Quit date: 12/06/2017    Years since quitting: 4.1   Smokeless tobacco: Never   Tobacco comments:    smokes 2 Black & Milds per day 08/19/13  Vaping Use    Vaping Use: Never used  Substance and Sexual Activity   Alcohol use: Not Currently   Drug use: Not Currently    Types: Marijuana   Sexual activity: Yes    Partners: Male    Birth control/protection: Surgical, None    Comment: hysterectomy  Other Topics Concern   Not on file  Social History Narrative   Patient lives at home with family.   Patient goes to [employer].   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   Social Determinants of Health   Financial Resource Strain: Not on file  Food Insecurity: Not on file  Transportation Needs: Not on file  Physical Activity: Not on file  Stress: Not on file  Social Connections: Not on file  Intimate Partner Violence: Not on file     Initial impression:  This patient presents to the ED for concern of chest tightness with flulike symptoms and additional concern for vaginal infection after condom breakage during sex, this involves an extensive number of treatment options, and is a complaint that carries with it a high risk of complications and morbidity.   Differentials include ACS, pneumonia, viral infection, asthma exacerbation.   Comorbidities affecting care:  Asthma  Additional history obtained: None  Lab Tests  I Ordered, reviewed, and interpreted labs and EKG.  The pertinent results include:  BMP: Normal CBC: Normal Urinalysis: Normal Delta troponin normal Wet prep negative Pending GC chlamydia Negative COVID  Imaging Studies ordered:  I ordered imaging studies including  Chest x-ray without acute findings I independently visualized and interpreted imaging and I agree with the radiologist interpretation.   EKG: Sinus rhythm   Medicines ordered and prescription drug management:  I ordered medication including: DuoNeb Reevaluation of the patient after these medicines showed that the patient stayed the same I have reviewed the patients home medicines  and have made adjustments as needed    ED Course/Re-evaluation: Presents  in no acute distress and is nontoxic-appearing.  Vitals without significant abnormality.  On exam, she sounds congested.  Lungs CTA bilaterally without wheezing, rales or rhonchi.  Given history of asthma, I did order DuoNeb treatment, however her symptoms were relatively unchanged.  Cardiac labs ordered in triage were overall reassuring with normal delta troponin, normal EKG and negative chest x-ray.  COVID test was negative.  I did a pelvic exam which which was about cervical motion tenderness, concern for PID is low.  Wet prep negative.  GC chlamydia sent out and patient will receive call if symptoms are abnormal.  Overall, her symptoms are consistent with a viral upper respiratory infection.  Advised Mucinex and Flonase and symptomatic management.  Patient expresses understanding and is amenable to this plan.  She request refill of her nebulizer solution.  Disposition:  After consideration of the diagnostic results, physical exam, history and the patients response to treatment feel that the patent would benefit from discharge.   Viral URI: Plan and management as described above. Discharged home in good condition.  Final diagnoses:  Viral URI    Rx / DC Orders ED Discharge Orders          Ordered    albuterol (PROVENTIL) (2.5 MG/3ML) 0.083% nebulizer solution  Every 6 hours PRN        02/10/22 1317              Tonye Pearson, PA-C 02/10/22 1443    Audley Hose, MD 02/10/22 1550

## 2022-02-10 NOTE — Discharge Instructions (Addendum)
Your work-up today was very reassuring.  Your labs and your heart all appear normal.  Your chest x-ray was normal as well.  You are negative for COVID.  Your wet prep was negative for yeast, bacteria and trichomonas.  Your STD panel will return tomorrow or the next day and you will receive a call if anything is abnormal.  I have refilled your nebulizer solution.  You can use this in conjunction with over-the-counter medications such as Mucinex decongestions and Flonase for your symptoms.

## 2022-02-11 LAB — GC/CHLAMYDIA PROBE AMP (~~LOC~~) NOT AT ARMC
Chlamydia: NEGATIVE
Comment: NEGATIVE
Comment: NORMAL
Neisseria Gonorrhea: NEGATIVE

## 2022-04-11 ENCOUNTER — Ambulatory Visit
Admission: EM | Admit: 2022-04-11 | Discharge: 2022-04-11 | Disposition: A | Discharge status: Home / Self Care | Payer: Managed Care, Other (non HMO) | Attending: Physician Assistant | Admitting: Physician Assistant

## 2022-04-11 DIAGNOSIS — N760 Acute vaginitis: Secondary | ICD-10-CM | POA: Insufficient documentation

## 2022-04-11 DIAGNOSIS — B9689 Other specified bacterial agents as the cause of diseases classified elsewhere: Secondary | ICD-10-CM | POA: Diagnosis not present

## 2022-04-11 LAB — POCT URINALYSIS DIP (MANUAL ENTRY)
Glucose, UA: NEGATIVE mg/dL
Leukocytes, UA: NEGATIVE
Nitrite, UA: NEGATIVE
Protein Ur, POC: 100 mg/dL — AB
Spec Grav, UA: >=1.03 — AB (ref 1.010–1.025)
Urobilinogen, UA: 1 E.U./dL
pH, UA: 6 (ref 5.0–8.0)

## 2022-04-11 MED ORDER — METRONIDAZOLE 500 MG PO TABS: 500.0000 mg | ORAL_TABLET | Freq: Two times a day (BID) | ORAL | 0 refills | Status: DC

## 2022-04-11 NOTE — Discharge Instructions (Signed)
Take Flagyl as prescribed Will call with lab results and change treatment plan if indicated Advised follow up with gynecology

## 2022-04-11 NOTE — ED Triage Notes (Signed)
Pt c/o pelvic cramping, red painful vagina, urinary frequency, urgency, discharge, onset ~ 2 days ago.

## 2022-04-11 NOTE — ED Provider Notes (Signed)
[facility]    CSN: [id number] Arrival date & time: 04/11/22  1514      History   Chief Complaint Chief Complaint  Patient presents with   Vaginal Discharge    HPI Suzanne [name] is a 46 y.o. female.   Pt complains of increased vaginal discharge, foul odor, and vaginal irritation that started several days ago.  She also complains of increased urination.  She reports today's sx feel similar to previous BV.  She reports she is taking a probiotic, using mild soaps, and has discontinued baths, but is still experiencing recurrence of BV.  Denies pelvic pain, fever, chills.     Past Medical History:  Diagnosis Date   Abnormal Pap smear of cervix    Allergy    Anemia    Anxiety    Arthritis    Asthma    Chest pain    Chronic lower back pain    Depression    Fibroid    GERD (gastroesophageal reflux disease)    Hypertension    Migraine    Migraines    Neck pain, chronic    Status post dilation of esophageal narrowing    STD (sexually transmitted disease)    HSV1, in eye    Patient Active Problem List   Diagnosis Date Noted   Iron deficiency anemia 10/01/2018   Thalassemia 10/01/2018   Galactorrhea 07/19/2018   Malnutrition of moderate degree ([place]) 07/19/2018   Abnormal TSH 07/19/2018   Nummular keratitis of right eye, 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/2018   Aortic atherosclerosis ([place]) 07/19/2018   Murmur, cardiac 03/26/2018   Former smoker, stopped 2019 03/26/2018   History of hysterectomy 11/25/2016   Unintentional weight loss 08/17/2016   B12 deficiency 08/12/2016   Daily nausea 08/06/2016   Microcytic anemia 08/06/2016   Chronic diarrhea 08/06/2016   Intestinal malabsorption 08/06/2016   Lung nodule < 6cm on CT 08/06/2016   Essential hypertension, on Norvasec, Hydralazine, and HCTZ; patient ACE/ARB intolerant 2/2 cough 07/02/2013   Atypical chest pain 07/02/2013   Shortness of breath 07/02/2013   Other and unspecified ovarian cyst 04/01/2013    Dysphagia 12/22/2011    Past Surgical History:  Procedure Laterality Date   COLPOSCOPY     ESOPHAGEAL DILATION     ESOPHAGEAL MANOMETRY N/A 04/01/2013   Procedure: ESOPHAGEAL MANOMETRY (EM);  Surgeon: Milus Banister, MD;  Location: [facility];  Service: Endoscopy;  Laterality: N/A;   ESOPHAGOGASTRODUODENOSCOPY (EGD) WITH ESOPHAGEAL DILATION N/A 02/28/2013   Procedure: ESOPHAGOGASTRODUODENOSCOPY (EGD) WITH ESOPHAGEAL DILATION;  Surgeon: Milus Banister, MD;  Location: [facility];  Service: Endoscopy;  Laterality: N/A;   TUBAL LIGATION     VAGINAL HYSTERECTOMY      OB History     Gravida  3   Para  2   Term      Preterm  2   AB  1   Living  1      SAB      IAB  1   Ectopic      Multiple      Live Births  2            Home Medications    Prior to Admission medications   Medication Sig Start Date End Date Taking? Authorizing Provider  metroNIDAZOLE (FLAGYL) 500 MG tablet Take 1 tablet (500 mg total) by mouth 2 (two) times daily. 04/11/22  Yes Ward, Lenise Arena, PA-C  albuterol (PROVENTIL) (2.5 MG/3ML) 0.083% nebulizer solution Take 3 mLs (2.5  mg total) by nebulization every 4 (four) hours as needed for wheezing or shortness of breath. 10/04/19   Hall-Potvin, [place], PA-C  albuterol (PROVENTIL) (2.5 MG/3ML) 0.083% nebulizer solution Take 3 mLs (2.5 mg total) by nebulization every 6 (six) hours as needed for wheezing or shortness of breath. 02/10/22   Tonye Pearson, PA-C  amLODipine (NORVASC) 10 MG tablet Take 1 tablet (10 mg total) by mouth daily. 07/31/19   Inda Coke, PA  carbamazepine (TEGRETOL) 200 MG tablet Take 1 tablet (200 mg total) by mouth daily. 10/22/19   Garald Balding, PA-C  cetirizine (ZYRTEC ALLERGY) 10 MG tablet Take 1 tablet (10 mg total) by mouth daily. 10/04/19   Hall-Potvin, [place], PA-C  diclofenac (VOLTAREN) 50 MG EC tablet Take 1 tablet (50 mg total) by mouth 2 (two) times daily. Patient not taking: Reported on 12/20/2021 11/04/19   Ok Edwards, PA-C  fluconazole (DIFLUCAN) 150 MG tablet Take 1 tablet (150 mg total) by mouth every three (3) days as needed. 09/15/21   Scot Jun, FNP  hydrochlorothiazide (HYDRODIURIL) 25 MG tablet Take 1 tablet (25 mg total) by mouth daily. 07/31/19   Inda Coke, PA  ketorolac (TORADOL) 10 MG tablet Take 1 tablet (10 mg total) by mouth every 8 (eight) hours as needed. 09/15/21   Scot Jun, FNP  Multiple Vitamins-Calcium (ONE-A-DAY WOMENS FORMULA PO) Take 1 tablet by mouth daily.    [provider]  omeprazole (PRILOSEC) 20 MG capsule Take 1 capsule (20 mg total) by mouth 2 (two) times daily before a meal for 10 days. 10/16/20 10/26/20  Milus Banister, MD  ondansetron (ZOFRAN) 4 MG tablet Take 1 tablet (4 mg total) by mouth every 6 (six) hours as needed for nausea or vomiting. 09/28/20   Noralyn Pick, NP  promethazine (PHENERGAN) 25 MG tablet Take 1 tablet (25 mg total) by mouth 2 (two) times daily as needed for nausea or vomiting. 09/28/20   Noralyn Pick, NP  fluticasone (FLONASE) 50 MCG/ACT nasal spray Place 1 spray into both nostrils daily. 10/04/19 11/04/19  Hall-Potvin, [place], PA-C    Family History Family History  Problem Relation Age of Onset   Heart disease Father        H/O CABG   Hypertension Mother    Hypertension Maternal Grandmother    Thyroid disease Maternal Grandmother    Diabetes Maternal Grandmother    Hypertension Paternal Grandmother    Diabetes Paternal Grandmother    Cancer - Lung Paternal Grandmother    Hypertension Maternal Aunt    Thyroid disease Maternal Aunt    Esophageal cancer Other        age unknow   Colon cancer Neg Hx    Stomach cancer Neg Hx     Social History Social History   Tobacco Use   Smoking status: Former    Packs/day: 0.10    Years: 10.00    Total pack years: 1.00    Types: Cigars, Cigarettes    Quit date: 12/06/2017    Years since quitting: 4.3   Smokeless tobacco: Never   Tobacco  comments:    smokes 2 Black & Milds per day 08/19/13  Vaping Use   Vaping Use: Never used  Substance Use Topics   Alcohol use: Not Currently   Drug use: Not Currently    Types: Marijuana     Allergies   Iodinated contrast media, Reglan [metoclopramide], Amoxicillin, Gabapentin, Pregabalin, Lisinopril, Losartan, and Sulfa antibiotics   Review  of Systems Review of Systems  Constitutional:  Negative for chills and fever.  HENT:  Negative for ear pain and sore throat.   Eyes:  Negative for pain and visual disturbance.  Respiratory:  Negative for cough and shortness of breath.   Cardiovascular:  Negative for chest pain and palpitations.  Gastrointestinal:  Negative for abdominal pain and vomiting.  Genitourinary:  Positive for frequency, urgency and vaginal discharge. Negative for hematuria.  Musculoskeletal:  Negative for arthralgias and back pain.  Skin:  Negative for color change and rash.  Neurological:  Negative for seizures and syncope.  All other systems reviewed and are negative.    Physical Exam Triage Vital Signs ED Triage Vitals [04/11/22 1632]  Enc Vitals Group     BP (!) 177/81     Pulse Rate 71     Resp 16     Temp 98.2 F (36.8 C)     Temp Source Oral     SpO2 99 %     Weight      Height      Head Circumference      Peak Flow      Pain Score 7     Pain Loc      Pain Edu?      Excl. in [place]?    No data found.  Updated Vital Signs BP (!) 177/81 (BP Location: Left Arm)   Pulse 71   Temp 98.2 F (36.8 C) (Oral)   Resp 16   SpO2 99%   Visual Acuity Right Eye Distance:   Left Eye Distance:   Bilateral Distance:    Right Eye Near:   Left Eye Near:    Bilateral Near:     Physical Exam Vitals and nursing note reviewed.  Constitutional:      General: She is not in acute distress.    Appearance: She is well-developed.  HENT:     Head: Normocephalic and atraumatic.  Eyes:     Conjunctiva/sclera: Conjunctivae normal.  Cardiovascular:     Rate  and Rhythm: Normal rate and regular rhythm.     Heart sounds: No murmur heard. Pulmonary:     Effort: Pulmonary effort is normal. No respiratory distress.     Breath sounds: Normal breath sounds.  Abdominal:     Palpations: Abdomen is soft.     Tenderness: There is no abdominal tenderness.  Musculoskeletal:        General: No swelling.     Cervical back: Neck supple.  Skin:    General: Skin is warm and dry.     Capillary Refill: Capillary refill takes less than 2 seconds.  Neurological:     Mental Status: She is alert.  Psychiatric:        Mood and Affect: Mood normal.      UC Treatments / Results  Labs (all labs ordered are listed, but only abnormal results are displayed) Labs Reviewed  POCT URINALYSIS DIP (MANUAL ENTRY) - Abnormal; Notable for the following components:      Result Value   Color, UA orange (*)    Bilirubin, UA small (*)    Ketones, POC UA trace (5) (*)    Spec Grav, UA >=1.030 (*)    Blood, UA trace-intact (*)    Protein Ur, POC =100 (*)    All other components within normal limits  CERVICOVAGINAL ANCILLARY ONLY    EKG   Radiology No results found.  Procedures Procedures (including critical care time)  Medications Ordered  in UC Medications - No data to display  Initial Impression / Assessment and Plan / UC Course  I have reviewed the triage vital signs and the nursing notes.  Pertinent labs & imaging results that were available during my care of the patient were reviewed by me and considered in my medical decision making (see chart for details).     Will treat for BV given sx.  Cervicovaginal self swab in clinic today.  Will change treatment plan if indicated based on results.  Advised follow up with gyn.  Final Clinical Impressions(s) / UC Diagnoses   Final diagnoses:  Bacterial vaginitis     Discharge Instructions      Take Flagyl as prescribed Will call with lab results and change treatment plan if indicated Advised follow up  with gynecology       ED Prescriptions     Medication Sig Dispense Auth. Provider   metroNIDAZOLE (FLAGYL) 500 MG tablet Take 1 tablet (500 mg total) by mouth 2 (two) times daily. 14 tablet Ward, Lenise Arena, PA-C      PDMP not reviewed this encounter.   Ward, Lenise Arena, PA-C 04/11/22 1704

## 2022-04-12 LAB — CERVICOVAGINAL ANCILLARY ONLY
Bacterial Vaginitis (gardnerella): POSITIVE — AB
Candida Glabrata: NEGATIVE
Candida Vaginitis: NEGATIVE
Chlamydia: NEGATIVE
Comment: NEGATIVE
Comment: NEGATIVE
Comment: NEGATIVE
Comment: NEGATIVE
Comment: NEGATIVE
Comment: NORMAL
Neisseria Gonorrhea: NEGATIVE
Trichomonas: NEGATIVE

## 2022-04-12 LAB — URINE CULTURE: Culture: NO GROWTH

## 2022-06-05 ENCOUNTER — Encounter (HOSPITAL_COMMUNITY): Payer: Self-pay

## 2022-06-05 ENCOUNTER — Emergency Department (HOSPITAL_COMMUNITY)
Admission: EM | Admit: 2022-06-05 | Discharge: 2022-06-06 | Disposition: A | Discharge status: Home / Self Care | Payer: Managed Care, Other (non HMO) | Attending: Emergency Medicine | Admitting: Emergency Medicine

## 2022-06-05 ENCOUNTER — Emergency Department (HOSPITAL_COMMUNITY): Payer: Managed Care, Other (non HMO)

## 2022-06-05 ENCOUNTER — Other Ambulatory Visit: Payer: Self-pay

## 2022-06-05 DIAGNOSIS — R1084 Generalized abdominal pain: Secondary | ICD-10-CM | POA: Insufficient documentation

## 2022-06-05 DIAGNOSIS — R112 Nausea with vomiting, unspecified: Secondary | ICD-10-CM | POA: Diagnosis present

## 2022-06-05 LAB — COMPREHENSIVE METABOLIC PANEL
ALT: 19 U/L (ref 0–44)
AST: 21 U/L (ref 15–41)
Albumin: 3.9 g/dL (ref 3.5–5.0)
Alkaline Phosphatase: 77 U/L (ref 38–126)
Anion gap: 10 (ref 5–15)
BUN: 24 mg/dL — ABNORMAL HIGH (ref 6–20)
CO2: 26 mmol/L (ref 22–32)
Calcium: 9.5 mg/dL (ref 8.9–10.3)
Chloride: 103 mmol/L (ref 98–111)
Creatinine, Ser: 0.76 mg/dL (ref 0.44–1.00)
GFR, Estimated: >60 mL/min (ref >60)
Glucose, Bld: 116 mg/dL — ABNORMAL HIGH (ref 70–99)
Potassium: 3.9 mmol/L (ref 3.5–5.1)
Sodium: 139 mmol/L (ref 135–145)
Total Bilirubin: 0.5 mg/dL (ref 0.3–1.2)
Total Protein: 8.7 g/dL — ABNORMAL HIGH (ref 6.5–8.1)

## 2022-06-05 LAB — CBC WITH DIFFERENTIAL/PLATELET
Abs Immature Granulocytes: 0.01 10*3/uL (ref 0.00–0.07)
Basophils Absolute: 0 10*3/uL (ref 0.0–0.1)
Basophils Relative: 0 %
Eosinophils Absolute: 0.1 10*3/uL (ref 0.0–0.5)
Eosinophils Relative: 1 %
HCT: 38.7 % (ref 36.0–46.0)
Hemoglobin: 12 g/dL (ref 12.0–15.0)
Immature Granulocytes: 0 %
Lymphocytes Relative: 8 %
Lymphs Abs: 0.8 10*3/uL (ref 0.7–4.0)
MCH: 22.8 pg — ABNORMAL LOW (ref 26.0–34.0)
MCHC: 31 g/dL (ref 30.0–36.0)
MCV: 73.6 fL — ABNORMAL LOW (ref 80.0–100.0)
Monocytes Absolute: 0.4 10*3/uL (ref 0.1–1.0)
Monocytes Relative: 4 %
Neutro Abs: 9.8 10*3/uL — ABNORMAL HIGH (ref 1.7–7.7)
Neutrophils Relative %: 87 %
Platelets: 230 10*3/uL (ref 150–400)
RBC: 5.26 MIL/uL — ABNORMAL HIGH (ref 3.87–5.11)
RDW: 16.2 % — ABNORMAL HIGH (ref 11.5–15.5)
WBC: 11.1 10*3/uL — ABNORMAL HIGH (ref 4.0–10.5)
nRBC: 0 % (ref 0.0–0.2)

## 2022-06-05 LAB — LIPASE, BLOOD: Lipase: 31 U/L (ref 11–51)

## 2022-06-05 MED ORDER — ALUM & MAG HYDROXIDE-SIMETH 200-200-20 MG/5ML PO SUSP
30.0000 mL | Freq: Once | ORAL | Status: AC
  Administered 2022-06-06: 30 mL via ORAL
  Filled 2022-06-05: qty 30

## 2022-06-05 MED ORDER — FAMOTIDINE IN NACL 20-0.9 MG/50ML-% IV SOLN
20.0000 mg | Freq: Once | INTRAVENOUS | Status: AC
  Administered 2022-06-06: 20 mg via INTRAVENOUS
  Filled 2022-06-05: qty 50

## 2022-06-05 MED ORDER — ONDANSETRON HCL 4 MG/2ML IJ SOLN: 4.0000 mg | Freq: Once | INTRAMUSCULAR | Status: DC

## 2022-06-05 MED ORDER — ONDANSETRON 4 MG PO TBDP
ORAL_TABLET | ORAL | Status: AC
  Administered 2022-06-05: 4 mg via ORAL
  Filled 2022-06-05: qty 1

## 2022-06-05 MED ORDER — MORPHINE SULFATE (PF) 4 MG/ML IV SOLN
4.0000 mg | Freq: Once | INTRAVENOUS | Status: AC
  Administered 2022-06-05: 4 mg via INTRAVENOUS
  Filled 2022-06-05: qty 1

## 2022-06-05 MED ORDER — ONDANSETRON 4 MG PO TBDP
4.0000 mg | ORAL_TABLET | Freq: Once | ORAL | Status: AC
  Administered 2022-06-05: 4 mg via ORAL

## 2022-06-05 MED ORDER — HYDROMORPHONE HCL 1 MG/ML IJ SOLN
1.0000 mg | Freq: Once | INTRAMUSCULAR | Status: AC
  Administered 2022-06-06: 1 mg via INTRAVENOUS
  Filled 2022-06-05: qty 1

## 2022-06-05 NOTE — ED Provider Notes (Signed)
[facility] DEPT Provider Note   CSN: [id number] Arrival date & time: 06/05/22  2013     History  Chief Complaint  Patient presents with   Vomiting    Suzanne [name] is a 47 y.o. female who presents to the ED with concerns for vomiting onset PTA. Has associated generalized abdominal pain, nausea, diarrhea. Denies history of similar symptoms or being admitted for similar symptoms.  Denies burning sensation. Pain is worse in the epigastric region. Denies chest pain, shortness of breath, cough, urinary symptoms, vaginal discharge.  Patient is that she was unable to keep down her antihypertensives due to her nausea and vomiting.  Denies marijuana use or illicit drug use.  The history is provided by the patient. No language interpreter was used.       Home Medications Prior to Admission medications   Medication Sig Start Date End Date Taking? Authorizing Provider  sucralfate (CARAFATE) 1 GM/10ML suspension Take 10 mLs (1 g total) by mouth 4 (four) times daily -  with meals and at bedtime. 06/06/22  Yes Katricia Prehn A, PA-C  albuterol (PROVENTIL) (2.5 MG/3ML) 0.083% nebulizer solution Take 3 mLs (2.5 mg total) by nebulization every 4 (four) hours as needed for wheezing or shortness of breath. 10/04/19   Hall-Potvin, [place], PA-C  albuterol (PROVENTIL) (2.5 MG/3ML) 0.083% nebulizer solution Take 3 mLs (2.5 mg total) by nebulization every 6 (six) hours as needed for wheezing or shortness of breath. 02/10/22   Tonye Pearson, PA-C  amLODipine (NORVASC) 10 MG tablet Take 1 tablet (10 mg total) by mouth daily. 07/31/19   Inda Coke, PA  carbamazepine (TEGRETOL) 200 MG tablet Take 1 tablet (200 mg total) by mouth daily. 10/22/19   Garald Balding, PA-C  cetirizine (ZYRTEC ALLERGY) 10 MG tablet Take 1 tablet (10 mg total) by mouth daily. 10/04/19   Hall-Potvin, [place], PA-C  diclofenac (VOLTAREN) 50 MG EC tablet Take 1 tablet (50 mg total) by mouth 2 (two) times  daily. Patient not taking: Reported on 12/20/2021 11/04/19   Ok Edwards, PA-C  fluconazole (DIFLUCAN) 150 MG tablet Take 1 tablet (150 mg total) by mouth every three (3) days as needed. 09/15/21   Scot Jun, FNP  hydrochlorothiazide (HYDRODIURIL) 25 MG tablet Take 1 tablet (25 mg total) by mouth daily. 07/31/19   Inda Coke, PA  ketorolac (TORADOL) 10 MG tablet Take 1 tablet (10 mg total) by mouth every 8 (eight) hours as needed. 09/15/21   Scot Jun, FNP  metroNIDAZOLE (FLAGYL) 500 MG tablet Take 1 tablet (500 mg total) by mouth 2 (two) times daily. 04/11/22   Ward, Lenise Arena, PA-C  Multiple Vitamins-Calcium (ONE-A-DAY WOMENS FORMULA PO) Take 1 tablet by mouth daily.    [provider]  omeprazole (PRILOSEC) 20 MG capsule Take 1 capsule (20 mg total) by mouth 2 (two) times daily before a meal for 10 days. 06/06/22 06/16/22  Avyaan Summer A, PA-C  ondansetron (ZOFRAN) 4 MG tablet Take 1 tablet (4 mg total) by mouth every 6 (six) hours as needed for nausea or vomiting. 06/06/22   Quana Chamberlain A, PA-C  promethazine (PHENERGAN) 25 MG tablet Take 1 tablet (25 mg total) by mouth 2 (two) times daily as needed for nausea or vomiting. 09/28/20   Noralyn Pick, NP  fluticasone (FLONASE) 50 MCG/ACT nasal spray Place 1 spray into both nostrils daily. 10/04/19 11/04/19  Hall-Potvin, [place], PA-C      Allergies    Iodinated contrast media,  Reglan [metoclopramide], Amoxicillin, Gabapentin, Pregabalin, Lisinopril, Losartan, and Sulfa antibiotics    Review of Systems   Review of Systems  Constitutional:  Negative for fever.  HENT:  Positive for rhinorrhea and sore throat. Negative for congestion and trouble swallowing.   Respiratory:  Positive for cough (x green sputum).   Gastrointestinal:  Positive for abdominal pain, nausea and vomiting.  Genitourinary:  Positive for dysuria. Negative for hematuria.  All other systems reviewed and are negative.   Physical Exam Updated Vital  Signs BP (!) 199/102 (BP Location: Right Arm)   Pulse 88   Temp (!) 97.4 F (36.3 C) (Oral)   Resp (!) 22   Ht '5\' 4"'$  (1.626 m)   Wt 83.9 kg   SpO2 100%   BMI 31.76 kg/m  Physical Exam Vitals and nursing note reviewed.  Constitutional:      General: She is not in acute distress.    Appearance: She is not diaphoretic.     Comments: Uncomfortable appearing, writhing in the bed.  HENT:     Head: Normocephalic and atraumatic.     Mouth/Throat:     Pharynx: No oropharyngeal exudate.  Eyes:     General: No scleral icterus.    Conjunctiva/sclera: Conjunctivae normal.  Cardiovascular:     Rate and Rhythm: Normal rate and regular rhythm.     Pulses: Normal pulses.     Heart sounds: Normal heart sounds.  Pulmonary:     Effort: Pulmonary effort is normal. No respiratory distress.     Breath sounds: Normal breath sounds. No wheezing.  Abdominal:     General: Bowel sounds are normal.     Palpations: Abdomen is soft. There is no mass.     Tenderness: There is abdominal tenderness. There is no guarding or rebound.     Comments: Generalized abdominal tenderness to palpation.  Musculoskeletal:        General: Normal range of motion.     Cervical back: Normal range of motion and neck supple.  Skin:    General: Skin is warm and dry.  Neurological:     Mental Status: She is alert.  Psychiatric:        Behavior: Behavior normal.     ED Results / Procedures / Treatments   Labs (all labs ordered are listed, but only abnormal results are displayed) Labs Reviewed  CBC WITH DIFFERENTIAL/PLATELET - Abnormal; Notable for the following components:      Result Value   WBC 11.1 (*)    RBC 5.26 (*)    MCV 73.6 (*)    MCH 22.8 (*)    RDW 16.2 (*)    Neutro Abs 9.8 (*)    All other components within normal limits  COMPREHENSIVE METABOLIC PANEL - Abnormal; Notable for the following components:   Glucose, Bld 116 (*)    BUN 24 (*)    Total Protein 8.7 (*)    All other components within  normal limits  URINALYSIS, ROUTINE W REFLEX MICROSCOPIC - Abnormal; Notable for the following components:   APPearance HAZY (*)    Ketones, ur 5 (*)    Protein, ur 30 (*)    Bacteria, UA RARE (*)    All other components within normal limits  RAPID URINE DRUG SCREEN, HOSP PERFORMED - Abnormal; Notable for the following components:   Opiates POSITIVE (*)    Tetrahydrocannabinol POSITIVE (*)    All other components within normal limits  LIPASE, BLOOD  TROPONIN I (HIGH SENSITIVITY)  TROPONIN I (  HIGH SENSITIVITY)    EKG EKG Interpretation  Date/Time:  Monday June 06 2022 01:02:21 EST Ventricular Rate:  80 PR Interval:  163 QRS Duration: 89 QT Interval:  382 QTC Calculation: 441 R Axis:   83 Text Interpretation: Sinus rhythm Left ventricular hypertrophy Borderline T abnormalities, inferior leads No significant change was found Confirmed by Shanon Rosser [phone]) on 06/06/2022 1:09:52 AM  Radiology CT ABDOMEN PELVIS WO CONTRAST  Result Date: 06/05/2022 CLINICAL DATA:  Vomiting green bile for 3 hours.  Abdominal pain EXAM: CT ABDOMEN AND PELVIS WITHOUT CONTRAST TECHNIQUE: Multidetector CT imaging of the abdomen and pelvis was performed following the standard protocol without IV contrast. RADIATION DOSE REDUCTION: This exam was performed according to the departmental dose-optimization program which includes automated exposure control, adjustment of the mA and/or kV according to patient size and/or use of iterative reconstruction technique. COMPARISON:  05/12/2018 FINDINGS: Lower chest: No acute abnormality. Hepatobiliary: No suspicious focal liver abnormality is seen. No gallstones, gallbladder wall thickening, or biliary dilatation. Pancreas: Unremarkable. No pancreatic ductal dilatation or surrounding inflammatory changes. Spleen: Normal in size without focal abnormality. Adrenals/Urinary Tract: Adrenal glands are unremarkable. Kidneys are normal, without renal calculi, suspicious focal lesion,  or hydronephrosis. Bladder is unremarkable. Stomach/Bowel: Stomach is within normal limits. No evidence of bowel wall thickening, distention, or inflammatory changes. The appendix is normal. Vascular/Lymphatic: No significant vascular findings are present. No enlarged abdominal or pelvic lymph nodes. Reproductive: Status post hysterectomy. No adnexal masses. Other: No free intraperitoneal fluid or air. Musculoskeletal: No acute or significant osseous findings. IMPRESSION: No acute abnormality in the abdomen or pelvis. Aortic Atherosclerosis (ICD10-I70.0). Electronically Signed   By: Placido Sou M.D.   On: 06/05/2022 21:34    Procedures Procedures    Medications Ordered in ED Medications  morphine (PF) 4 MG/ML injection 4 mg (4 mg Intravenous Given 06/05/22 2201)  ondansetron (ZOFRAN-ODT) disintegrating tablet 4 mg (4 mg Oral Given by Other 06/05/22 2100)  HYDROmorphone (DILAUDID) injection 1 mg (1 mg Intravenous Given 06/06/22 0016)  alum & mag hydroxide-simeth (MAALOX/MYLANTA) 200-200-20 MG/5ML suspension 30 mL (30 mLs Oral Given 06/06/22 0019)  famotidine (PEPCID) IVPB 20 mg premix (0 mg Intravenous Stopped 06/06/22 0053)  prochlorperazine (COMPAZINE) injection 10 mg (10 mg Intravenous Given 06/06/22 0145)  diphenhydrAMINE (BENADRYL) injection 25 mg (25 mg Intravenous Given 06/06/22 0144)  haloperidol lactate (HALDOL) injection 2.5 mg (2.5 mg Intravenous Given 06/06/22 0400)    ED Course/ Medical Decision Making/ A&P Clinical Course as of 06/06/22 0607  Mon Jun 06, 2022  0101 Pt re-evaluated and noted improvement of pain with treatment regimen in the ED. Pt still with nausea and vomiting. [SB]  0251 Re-evaluated and noted to still have nausea at this time.  Will order additional medications. [SB]  [id number] Re-evaluated patient notes improvement of symptoms.  Discussed with patient discharge treatment plan.  Answered all available questions.  Patient appears safe for discharge at this time. [SB]     Clinical Course User Index [SB] Neill Jurewicz A, PA-C                           Medical Decision Making Risk OTC drugs. Prescription drug management.   Patient presents to the emergency department with concerns for vomiting onset prior to arrival.  Also has generalized abdominal pain, nausea, diarrhea.  No history of similar symptoms.  Notes THC use.  Patient afebrile.  On exam patient with diffuse abdominal tenderness to palpation.  No  acute cardiovascular respiratory exam findings.  Differential diagnosis includes GERD, pancreatitis, cholecystitis, ACS, cannabinoid hyperemesis syndrome.   Labs:  I ordered, and personally interpreted labs.  The pertinent results include:  Lipase unremarkable. CBC with slightly elevated leukocytosis at 11.1. Urinalysis unremarkable. CMP unremarkable. Initial troponin of 4, delta troponin of 5. UDS notable for opiates and THC.  Imaging: I ordered imaging studies including CT abdomen pelvis I independently visualized and interpreted imaging which showed no acute findings I agree with the radiologist interpretation  Medications:  I ordered medication including Zofran, morphine, Dilaudid, GI cocktail, Pepcid, Benadryl, Compazine, Haldol for symptom management.  Reevaluation of the patient after these medicines and interventions, I reevaluated the patient and found that they have improved I have reviewed the patients home medicines and have made adjustments as needed  Disposition: Presentation suspicious for nausea and vomiting in the setting of likely cannabinoid hyperemesis syndrome.  Doubt at this time concerns for pancreatitis, cholecystitis, ACS. After consideration of the diagnostic results and the patients response to treatment, I feel that the patient would benefit from Discharge home.  Prescription provided for Zofran, Prilosec, Carafate.  Instructed patient to follow-up with her primary care provider supportive care measures and strict return  precautions discussed with patient at bedside. Pt acknowledges and verbalizes understanding. Pt appears safe for discharge. Follow up as indicated in discharge paperwork.   This chart was dictated using voice recognition software, Dragon. Despite the best efforts of this provider to proofread and correct errors, errors may still occur which can change documentation meaning.  Final Clinical Impression(s) / ED Diagnoses Final diagnoses:  Nausea and vomiting in adult    Rx / DC Orders ED Discharge Orders          Ordered    sucralfate (CARAFATE) 1 GM/10ML suspension  3 times daily with meals & bedtime        06/06/22 0440    ondansetron (ZOFRAN) 4 MG tablet  Every 6 hours PRN        06/06/22 0440    omeprazole (PRILOSEC) 20 MG capsule  2 times daily before meals        06/06/22 0440              Erendira Crabtree A, PA-C 06/06/22 0607    Molpus, Jenny Reichmann, MD 06/06/22 [phone]

## 2022-06-05 NOTE — ED Triage Notes (Signed)
Pt presents with c/o vomiting for 3 hours. Pt also reports that what she is throwing up is green in color and that she has abdominal pain as well.

## 2022-06-05 NOTE — ED Provider Triage Note (Signed)
Emergency Medicine Provider Triage Evaluation Note  Suzanne [name] , a 47 y.o. female  was evaluated in triage with daughter and niece. Pt c/o severe abdominal pain with nausea, vomiting, and diarrhea that started about 4 hours prior to arrival. She denies a history of these symptoms. Describes pain as all over but worse epigastric. Only prior abdominal surgery is hysterectomy. She denies chest pain, shortness of breath, fever, cough, back pain, urinary symptoms, or vaginal discharge. Several episodes of vomiting (green) and diarrhea over the last few hours. No known sick contacts.   Review of Systems  Positive: See HPI Negative: See HPI  Physical Exam  BP (!) 199/102 (BP Location: Right Arm)   Pulse 88   Temp (!) 97.4 F (36.3 C) (Oral)   Resp (!) 22   Ht '5\' 4"'$  (1.626 m)   Wt 83.9 kg   SpO2 100%   BMI 31.76 kg/m  Gen:   Awake, moderate distress secondary to nausea and pain Resp:  Normal effort LCTA MSK:   Moves extremities without difficulty  Other:  Moderate diffuse abdominal tenderness, severe epigastric tenderness, positive guarding, actively vomiting  Medical Decision Making  Medically screening exam initiated at 8:47 PM.  Appropriate orders placed.  Suzanne [name] was informed that the remainder of the evaluation will be completed by another provider, this initial triage assessment does not replace that evaluation, and the importance of remaining in the ED until their evaluation is complete.     Suzanne [name] 06/05/22 2052

## 2022-06-06 LAB — URINALYSIS, ROUTINE W REFLEX MICROSCOPIC
Bilirubin Urine: NEGATIVE
Glucose, UA: NEGATIVE mg/dL
Hgb urine dipstick: NEGATIVE
Ketones, ur: 5 mg/dL — AB
Leukocytes,Ua: NEGATIVE
Nitrite: NEGATIVE
Protein, ur: 30 mg/dL — AB
Specific Gravity, Urine: 1.023 (ref 1.005–1.030)
pH: 7 (ref 5.0–8.0)

## 2022-06-06 LAB — TROPONIN I (HIGH SENSITIVITY)
Troponin I (High Sensitivity): 4 ng/L (ref <18)
Troponin I (High Sensitivity): 5 ng/L (ref <18)

## 2022-06-06 LAB — RAPID URINE DRUG SCREEN, HOSP PERFORMED
Amphetamines: NOT DETECTED
Barbiturates: NOT DETECTED
Benzodiazepines: NOT DETECTED
Cocaine: NOT DETECTED
Opiates: POSITIVE — AB
Tetrahydrocannabinol: POSITIVE — AB

## 2022-06-06 MED ORDER — PROCHLORPERAZINE EDISYLATE 10 MG/2ML IJ SOLN
10.0000 mg | Freq: Once | INTRAMUSCULAR | Status: AC
  Administered 2022-06-06: 10 mg via INTRAVENOUS
  Filled 2022-06-06: qty 2

## 2022-06-06 MED ORDER — OMEPRAZOLE 20 MG PO CPDR: 20.0000 mg | DELAYED_RELEASE_CAPSULE | Freq: Two times a day (BID) | ORAL | 0 refills | Status: DC

## 2022-06-06 MED ORDER — SUCRALFATE 1 GM/10ML PO SUSP: 1.0000 g | Freq: Three times a day (TID) | ORAL | 0 refills | Status: DC

## 2022-06-06 MED ORDER — DIPHENHYDRAMINE HCL 50 MG/ML IJ SOLN
25.0000 mg | Freq: Once | INTRAMUSCULAR | Status: AC
  Administered 2022-06-06: 25 mg via INTRAVENOUS
  Filled 2022-06-06: qty 1

## 2022-06-06 MED ORDER — ONDANSETRON HCL 4 MG PO TABS: 4.0000 mg | ORAL_TABLET | Freq: Four times a day (QID) | ORAL | 0 refills | Status: DC | PRN

## 2022-06-06 MED ORDER — HALOPERIDOL LACTATE 5 MG/ML IJ SOLN
2.5000 mg | Freq: Once | INTRAMUSCULAR | Status: AC
  Administered 2022-06-06: 2.5 mg via INTRAVENOUS
  Filled 2022-06-06: qty 1

## 2022-06-06 NOTE — Discharge Instructions (Addendum)
It was a pleasure taking care of you today!  Your labs didn't have any emergent concerning findings. Your CT scan didn't show any concerning findings today. You will be sent a prescription for zofran, take if you are experiencing nausea or vomiting.  Once you take this medication you will need to wait at least 30 minutes before you attempt small sips and small bites.  He also be sent a prescription for Protonix and Carafate, take as directed.  Follow-up with your primary care provider regarding today's ED visit.  Return to the emergency department if you experience increasing/worsening symptoms.

## 2022-06-22 DIAGNOSIS — G43E09 Chronic migraine with aura, not intractable, without status migrainosus: Secondary | ICD-10-CM | POA: Insufficient documentation

## 2022-07-08 DIAGNOSIS — Z8619 Personal history of other infectious and parasitic diseases: Secondary | ICD-10-CM | POA: Insufficient documentation

## 2022-09-17 ENCOUNTER — Ambulatory Visit
Admission: EM | Admit: 2022-09-17 | Discharge: 2022-09-17 | Disposition: A | Discharge status: Home / Self Care | Payer: Managed Care, Other (non HMO) | Attending: Internal Medicine | Admitting: Internal Medicine

## 2022-09-17 ENCOUNTER — Encounter: Payer: Self-pay | Admitting: Emergency Medicine

## 2022-09-17 DIAGNOSIS — R300 Dysuria: Secondary | ICD-10-CM | POA: Diagnosis present

## 2022-09-17 DIAGNOSIS — Z113 Encounter for screening for infections with a predominantly sexual mode of transmission: Secondary | ICD-10-CM | POA: Diagnosis present

## 2022-09-17 DIAGNOSIS — N898 Other specified noninflammatory disorders of vagina: Secondary | ICD-10-CM | POA: Diagnosis present

## 2022-09-17 LAB — POCT URINALYSIS DIP (MANUAL ENTRY)
Bilirubin, UA: NEGATIVE
Glucose, UA: NEGATIVE mg/dL
Ketones, POC UA: NEGATIVE mg/dL
Nitrite, UA: NEGATIVE
Protein Ur, POC: 30 mg/dL — AB
Spec Grav, UA: >=1.03 — AB (ref 1.010–1.025)
Urobilinogen, UA: 0.2 E.U./dL
pH, UA: 6.5 (ref 5.0–8.0)

## 2022-09-17 MED ORDER — FLUCONAZOLE 150 MG PO TABS: 150.0000 mg | ORAL_TABLET | ORAL | 0 refills | Status: DC

## 2022-09-17 MED ORDER — NITROFURANTOIN MONOHYD MACRO 100 MG PO CAPS: 100.0000 mg | ORAL_CAPSULE | Freq: Two times a day (BID) | ORAL | 0 refills | Status: DC

## 2022-09-17 NOTE — ED Provider Notes (Signed)
[name]    CSN: [id number] Arrival date & time: 09/17/22  4098      History   Chief Complaint Chief Complaint  Patient presents with   Vaginal Burning    HPI Suzanne [name] is a 47 y.o. female.   Patient presents with vaginal burning, dysuria, white vaginal discharge that started about 2 days ago.  Patient reports that she has some mild left lower abdominal discomfort as well.  Denies back pain, fever, pelvic pain, urinary frequency.  Denies exposure to STD.  Reports unprotected intercourse approximately 2 months ago.  Patient has a history of recurrent BV and vaginal yeast but states that this feels different and she is concerned for STD.  Patient no longer has menstrual cycles.  Blood pressure is elevated but patient reports that she has not taken her blood pressure medication today.  Patient not reporting any chest pain, shortness of breath, nausea, vomiting, headache, dizziness.     Past Medical History:  Diagnosis Date   Abnormal Pap smear of cervix    Allergy    Anemia    Anxiety    Arthritis    Asthma    Chest pain    Chronic lower back pain    Depression    Fibroid    GERD (gastroesophageal reflux disease)    Hypertension    Migraine    Migraines    Neck pain, chronic    Status post dilation of esophageal narrowing    STD (sexually transmitted disease)    HSV1, in eye    Patient Active Problem List   Diagnosis Date Noted   Iron deficiency anemia 10/01/2018   Thalassemia 10/01/2018   Galactorrhea 07/19/2018   Malnutrition of moderate degree 07/19/2018   Abnormal TSH 07/19/2018   Nummular keratitis of right eye, 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/2018   Aortic atherosclerosis 07/19/2018   Murmur, cardiac 03/26/2018   Former smoker, stopped 2019 03/26/2018   History of hysterectomy 11/25/2016   Unintentional weight loss 08/17/2016   B12 deficiency 08/12/2016   Daily nausea 08/06/2016   Microcytic anemia 08/06/2016   Chronic diarrhea 08/06/2016    Intestinal malabsorption 08/06/2016   Lung nodule < 6cm on CT 08/06/2016   Essential hypertension, on Norvasec, Hydralazine, and HCTZ; patient ACE/ARB intolerant 2/2 cough 07/02/2013   Atypical chest pain 07/02/2013   Shortness of breath 07/02/2013   Other and unspecified ovarian cyst 04/01/2013   Dysphagia 12/22/2011    Past Surgical History:  Procedure Laterality Date   COLPOSCOPY     ESOPHAGEAL DILATION     ESOPHAGEAL MANOMETRY N/A 04/01/2013   Procedure: ESOPHAGEAL MANOMETRY (EM);  Surgeon: Rachael Fee, MD;  Location: [facility];  Service: Endoscopy;  Laterality: N/A;   ESOPHAGOGASTRODUODENOSCOPY (EGD) WITH ESOPHAGEAL DILATION N/A 02/28/2013   Procedure: ESOPHAGOGASTRODUODENOSCOPY (EGD) WITH ESOPHAGEAL DILATION;  Surgeon: Rachael Fee, MD;  Location: [facility];  Service: Endoscopy;  Laterality: N/A;   TUBAL LIGATION     VAGINAL HYSTERECTOMY      OB History     Gravida  3   Para  2   Term      Preterm  2   AB  1   Living  1      SAB      IAB  1   Ectopic      Multiple      Live Births  2            Home Medications    Prior to  Admission medications   Medication Sig Start Date End Date Taking? Authorizing Provider  albuterol (PROVENTIL) (2.5 MG/3ML) 0.083% nebulizer solution Take 3 mLs (2.5 mg total) by nebulization every 4 (four) hours as needed for wheezing or shortness of breath. 10/04/19  Yes Hall-Potvin, [place], PA-C  albuterol (PROVENTIL) (2.5 MG/3ML) 0.083% nebulizer solution Take 3 mLs (2.5 mg total) by nebulization every 6 (six) hours as needed for wheezing or shortness of breath. 02/10/22  Yes Raynald Blend R, PA-C  amLODipine (NORVASC) 10 MG tablet Take 1 tablet (10 mg total) by mouth daily. 07/31/19  Yes Jarold Motto, PA  carbamazepine (TEGRETOL) 200 MG tablet Take 1 tablet (200 mg total) by mouth daily. 10/22/19  Yes Mare Ferrari, PA-C  cetirizine (ZYRTEC ALLERGY) 10 MG tablet Take 1 tablet (10 mg total) by mouth daily.  10/04/19  Yes Hall-Potvin, [place], PA-C  diclofenac (VOLTAREN) 50 MG EC tablet Take 1 tablet (50 mg total) by mouth 2 (two) times daily. 11/04/19  Yes Yu, Amy V, PA-C  fluconazole (DIFLUCAN) 150 MG tablet Take 1 tablet (150 mg total) by mouth every 3 (three) days. 09/17/22  Yes , Rolly Salter E, FNP  hydrochlorothiazide (HYDRODIURIL) 25 MG tablet Take 1 tablet (25 mg total) by mouth daily. 07/31/19  Yes Jarold Motto, PA  ketorolac (TORADOL) 10 MG tablet Take 1 tablet (10 mg total) by mouth every 8 (eight) hours as needed. 09/15/21  Yes Bing Neighbors, NP  metroNIDAZOLE (FLAGYL) 500 MG tablet Take 1 tablet (500 mg total) by mouth 2 (two) times daily. 04/11/22  Yes Ward, Tylene Fantasia, PA-C  Multiple Vitamins-Calcium (ONE-A-DAY WOMENS FORMULA PO) Take 1 tablet by mouth daily.   Yes [provider]  nitrofurantoin, macrocrystal-monohydrate, (MACROBID) 100 MG capsule Take 1 capsule (100 mg total) by mouth 2 (two) times daily. 09/17/22  Yes , Rolly Salter E, FNP  ondansetron (ZOFRAN) 4 MG tablet Take 1 tablet (4 mg total) by mouth every 6 (six) hours as needed for nausea or vomiting. 06/06/22  Yes Blue, Soijett A, PA-C  promethazine (PHENERGAN) 25 MG tablet Take 1 tablet (25 mg total) by mouth 2 (two) times daily as needed for nausea or vomiting. 09/28/20  Yes Arnaldo Natal, NP  sucralfate (CARAFATE) 1 GM/10ML suspension Take 10 mLs (1 g total) by mouth 4 (four) times daily -  with meals and at bedtime. 06/06/22  Yes Blue, Soijett A, PA-C  omeprazole (PRILOSEC) 20 MG capsule Take 1 capsule (20 mg total) by mouth 2 (two) times daily before a meal for 10 days. 06/06/22 06/16/22  Blue, Soijett A, PA-C  fluticasone (FLONASE) 50 MCG/ACT nasal spray Place 1 spray into both nostrils daily. 10/04/19 11/04/19  Hall-Potvin, [place], PA-C    Family History Family History  Problem Relation Age of Onset   Heart disease Father        H/O CABG   Hypertension Mother    Hypertension Maternal Grandmother    Thyroid  disease Maternal Grandmother    Diabetes Maternal Grandmother    Hypertension Paternal Grandmother    Diabetes Paternal Grandmother    Cancer - Lung Paternal Grandmother    Hypertension Maternal Aunt    Thyroid disease Maternal Aunt    Esophageal cancer Other        age unknow   Colon cancer Neg Hx    Stomach cancer Neg Hx     Social History Social History   Tobacco Use   Smoking status: Former    Packs/day: 0.10    Years:  10.00    Additional pack years: 0.00    Total pack years: 1.00    Types: Cigars, Cigarettes    Quit date: 12/06/2017    Years since quitting: 4.7   Smokeless tobacco: Never   Tobacco comments:    smokes 2 Black & Milds per day 08/19/13  Vaping Use   Vaping Use: Never used  Substance Use Topics   Alcohol use: Not Currently   Drug use: Not Currently    Types: Marijuana     Allergies   Iodinated contrast media, Reglan [metoclopramide], Amoxicillin, Gabapentin, Pregabalin, Lisinopril, Losartan, and Sulfa antibiotics   Review of Systems Review of Systems Per HPI  Physical Exam Triage Vital Signs ED Triage Vitals  Enc Vitals Group     BP 09/17/22 0957 (!) 168/89     Pulse Rate 09/17/22 0957 70     Resp 09/17/22 0957 16     Temp 09/17/22 0957 97.7 F (36.5 C)     Temp Source 09/17/22 0957 Oral     SpO2 09/17/22 0957 100 %     Weight 09/17/22 0958 182 lb (82.6 kg)     Height 09/17/22 0958  (1.6 m)     Head Circumference --      Peak Flow --      Pain Score 09/17/22 0958 8     Pain Loc --      Pain Edu? --      Excl. in GC? --    No data found.  Updated Vital Signs BP (!) 168/89 (BP Location: Left Arm)   Pulse 70   Temp 97.7 F (36.5 C) (Oral)   Resp 16   Ht  (1.6 m)   Wt 182 lb (82.6 kg)   SpO2 100%   BMI 32.24 kg/m   Visual Acuity Right Eye Distance:   Left Eye Distance:   Bilateral Distance:    Right Eye Near:   Left Eye Near:    Bilateral Near:     Physical Exam Constitutional:      General: She is not in  acute distress.    Appearance: Normal appearance. She is not toxic-appearing or diaphoretic.  HENT:     Head: Normocephalic and atraumatic.  Eyes:     Extraocular Movements: Extraocular movements intact.     Conjunctiva/sclera: Conjunctivae normal.  Pulmonary:     Effort: Pulmonary effort is normal.  Genitourinary:    Comments: Deferred with shared decision making. Self swab performed.  Neurological:     General: No focal deficit present.     Mental Status: She is alert and oriented to person, place, and time. Mental status is at baseline.  Psychiatric:        Mood and Affect: Mood normal.        Behavior: Behavior normal.        Thought Content: Thought content normal.        Judgment: Judgment normal.      UC Treatments / Results  Labs (all labs ordered are listed, but only abnormal results are displayed) Labs Reviewed  POCT URINALYSIS DIP (MANUAL ENTRY) - Abnormal; Notable for the following components:      Result Value   Clarity, UA cloudy (*)    Spec Grav, UA >=1.030 (*)    Blood, UA moderate (*)    Protein Ur, POC =30 (*)    Leukocytes, UA Small (1+) (*)    All other components within normal limits  URINE CULTURE  CERVICOVAGINAL ANCILLARY  ONLY    EKG   Radiology No results found.  Procedures Procedures (including critical care time)  Medications Ordered in UC Medications - No data to display  Initial Impression / Assessment and Plan / UC Course  I have reviewed the triage vital signs and the nursing notes.  Pertinent labs & imaging results that were available during my care of the patient were reviewed by me and considered in my medical decision making (see chart for details).     UA showing a small amount of leukocytes and with associated dysuria, could be UTI.  Although, given patient's vaginal discharge, I am more suspicious of vaginitis.  Although, will opt to treat with Macrobid today given UA findings and send urine culture and cervicovaginal swab  to confirm.  I am also suspicions of possible vaginal yeast infection given patient description of vaginal discharge so will opt to treat with Diflucan as patient has taken to this before along with other daily medications and tolerated well.  Will await urine culture and vaginal swab for any further treatment.  Patient advised to refrain from sexual activity until test results and treatment are complete.  Blood pressure elevated but patient reports that she has not taken her blood pressure medication today.  Asymptomatic regarding blood pressure so patient encouraged to take blood pressure medication as prescribed as soon as possible.  Patient verbalized understanding and was agreeable with  plan. Final Clinical Impressions(s) / UC Diagnoses   Final diagnoses:  Dysuria  Vaginal discharge  Screening examination for venereal disease     Discharge Instructions      I have prescribed an antibiotic to treat urinary tract infection and Diflucan given suspicion of yeast infection.  Your vaginal swab and urine culture are pending.  Will call with the result and change treatment if necessary.  Please refrain from sexual activity until test results and treatment are complete.    ED Prescriptions     Medication Sig Dispense Auth. Provider   nitrofurantoin, macrocrystal-monohydrate, (MACROBID) 100 MG capsule Take 1 capsule (100 mg total) by mouth 2 (two) times daily. 10 capsule Ervin Knack E, [place]   fluconazole (DIFLUCAN) 150 MG tablet Take 1 tablet (150 mg total) by mouth every 3 (three) days. 2 tablet [place], Acie Fredrickson, [place]      PDMP not reviewed this encounter.   Gustavus Bryant, [place] 09/17/22 1037

## 2022-09-17 NOTE — Discharge Instructions (Signed)
I have prescribed an antibiotic to treat urinary tract infection and Diflucan given suspicion of yeast infection.  Your vaginal swab and urine culture are pending.  Will call with the result and change treatment if necessary.  Please refrain from sexual activity until test results and treatment are complete.

## 2022-09-17 NOTE — ED Triage Notes (Signed)
Patient c/o vaginal burning x 2 days, white clumpy vaginal discharge, dysuria, pressure.  Patient has taken AZO.  Concern for STI.

## 2022-09-19 LAB — CERVICOVAGINAL ANCILLARY ONLY
Bacterial Vaginitis (gardnerella): NEGATIVE
Candida Glabrata: NEGATIVE
Candida Vaginitis: NEGATIVE
Chlamydia: NEGATIVE
Comment: NEGATIVE
Comment: NEGATIVE
Comment: NEGATIVE
Comment: NEGATIVE
Comment: NEGATIVE
Comment: NORMAL
Neisseria Gonorrhea: NEGATIVE
Trichomonas: NEGATIVE

## 2022-09-20 LAB — URINE CULTURE: Culture: 80000 — AB

## 2022-09-26 ENCOUNTER — Ambulatory Visit: Payer: Self-pay

## 2022-09-26 NOTE — Telephone Encounter (Signed)
     Chief Complaint: Pt. Seen at [facility] 09/17/22, finished antibiotics and still having symptoms. No availability at PCP.  Symptoms: Frequency, pain Frequency: Last week. Pertinent Negatives: Patient denies  Disposition: [] ED /[x] Urgent Care (no appt availability in office) / [] Appointment(In office/virtual)/ []  [facility]/ [] Home Care/ [] Refused Recommended Disposition /[]  Mobile Bus/ []  Follow-up with PCP Additional Notes: Attempted transfer to UC at [facility] per pt. request, pt. Disconnected call  Reason for Disposition  [1] Taking antibiotic > 72 hours (3 days) for UTI AND [2] painful urination or frequency is SAME (unchanged, not better)  Answer Assessment - Initial Assessment Questions 1. MAIN SYMPTOM: "What is the main symptom you are concerned about?" (e.g., painful urination, urine frequency)     Frequency, pressure 2. BETTER-SAME-WORSE: "Are you getting better, staying the same, or getting worse compared to how you felt at your last visit to the doctor (most recent medical visit)?"     Same 3. PAIN: "How bad is the pain?"  (e.g., Scale 1-10; mild, moderate, or severe)   - MILD (1-3): complains slightly about urination hurting   - MODERATE (4-7): interferes with normal activities     - SEVERE (8-10): excruciating, unwilling or unable to urinate because of the pain      8 4. FEVER: "Do you have a fever?" If Yes, ask: "What is it, how was it measured, and when did it start?"     No 5. OTHER SYMPTOMS: "Do you have any other symptoms?" (e.g., blood in the urine, flank pain, vaginal discharge)     Pain 6. DIAGNOSIS: "When was the UTI diagnosed?" "By whom?" "Was it a kidney infection, bladder infection or both?"     Yes 7. ANTIBIOTIC: "What antibiotic(s) are you taking?" "How many times per day?"     Macrobid 8. ANTIBIOTIC - START DATE: "When did you start taking the antibiotic?"     09/17/22  Protocols used: Urinary Tract Infection on Antibiotic Follow-up Call  - [facility]

## 2022-12-19 ENCOUNTER — Inpatient Hospital Stay (HOSPITAL_COMMUNITY)
Admission: EM | Admit: 2022-12-19 | Discharge: 2022-12-24 | DRG: 391 | Disposition: A | Discharge status: Home Health | Payer: Managed Care, Other (non HMO) | Attending: Internal Medicine | Admitting: Internal Medicine

## 2022-12-19 ENCOUNTER — Encounter (HOSPITAL_COMMUNITY): Payer: Self-pay

## 2022-12-19 ENCOUNTER — Emergency Department (HOSPITAL_COMMUNITY): Payer: Managed Care, Other (non HMO)

## 2022-12-19 DIAGNOSIS — K2950 Unspecified chronic gastritis without bleeding: Secondary | ICD-10-CM | POA: Diagnosis present

## 2022-12-19 DIAGNOSIS — Y92009 Unspecified place in unspecified non-institutional (private) residence as the place of occurrence of the external cause: Secondary | ICD-10-CM

## 2022-12-19 DIAGNOSIS — F32A Depression, unspecified: Secondary | ICD-10-CM | POA: Diagnosis present

## 2022-12-19 DIAGNOSIS — T50995A Adverse effect of other drugs, medicaments and biological substances, initial encounter: Secondary | ICD-10-CM | POA: Diagnosis present

## 2022-12-19 DIAGNOSIS — U071 COVID-19: Secondary | ICD-10-CM | POA: Diagnosis present

## 2022-12-19 DIAGNOSIS — K222 Esophageal obstruction: Secondary | ICD-10-CM | POA: Diagnosis present

## 2022-12-19 DIAGNOSIS — R1310 Dysphagia, unspecified: Secondary | ICD-10-CM | POA: Diagnosis not present

## 2022-12-19 DIAGNOSIS — Z8249 Family history of ischemic heart disease and other diseases of the circulatory system: Secondary | ICD-10-CM

## 2022-12-19 DIAGNOSIS — R1110 Vomiting, unspecified: Secondary | ICD-10-CM

## 2022-12-19 DIAGNOSIS — Z8616 Personal history of COVID-19: Secondary | ICD-10-CM

## 2022-12-19 DIAGNOSIS — F419 Anxiety disorder, unspecified: Secondary | ICD-10-CM | POA: Diagnosis present

## 2022-12-19 DIAGNOSIS — Z833 Family history of diabetes mellitus: Secondary | ICD-10-CM

## 2022-12-19 DIAGNOSIS — G43909 Migraine, unspecified, not intractable, without status migrainosus: Secondary | ICD-10-CM | POA: Diagnosis present

## 2022-12-19 DIAGNOSIS — R1013 Epigastric pain: Secondary | ICD-10-CM

## 2022-12-19 DIAGNOSIS — I10 Essential (primary) hypertension: Secondary | ICD-10-CM | POA: Diagnosis present

## 2022-12-19 DIAGNOSIS — Z882 Allergy status to sulfonamides status: Secondary | ICD-10-CM

## 2022-12-19 DIAGNOSIS — Z90710 Acquired absence of both cervix and uterus: Secondary | ICD-10-CM

## 2022-12-19 DIAGNOSIS — R531 Weakness: Secondary | ICD-10-CM

## 2022-12-19 DIAGNOSIS — Z87891 Personal history of nicotine dependence: Secondary | ICD-10-CM

## 2022-12-19 DIAGNOSIS — Z91041 Radiographic dye allergy status: Secondary | ICD-10-CM

## 2022-12-19 DIAGNOSIS — R109 Unspecified abdominal pain: Secondary | ICD-10-CM | POA: Diagnosis not present

## 2022-12-19 DIAGNOSIS — Z79899 Other long term (current) drug therapy: Secondary | ICD-10-CM

## 2022-12-19 DIAGNOSIS — K521 Toxic gastroenteritis and colitis: Secondary | ICD-10-CM | POA: Diagnosis present

## 2022-12-19 DIAGNOSIS — Z5941 Food insecurity: Secondary | ICD-10-CM

## 2022-12-19 DIAGNOSIS — D509 Iron deficiency anemia, unspecified: Secondary | ICD-10-CM | POA: Diagnosis present

## 2022-12-19 DIAGNOSIS — Z800 Family history of malignant neoplasm of digestive organs: Secondary | ICD-10-CM

## 2022-12-19 DIAGNOSIS — E860 Dehydration: Secondary | ICD-10-CM | POA: Diagnosis present

## 2022-12-19 DIAGNOSIS — J45909 Unspecified asthma, uncomplicated: Secondary | ICD-10-CM | POA: Diagnosis present

## 2022-12-19 DIAGNOSIS — Z888 Allergy status to other drugs, medicaments and biological substances status: Secondary | ICD-10-CM

## 2022-12-19 DIAGNOSIS — R110 Nausea: Secondary | ICD-10-CM | POA: Diagnosis not present

## 2022-12-19 DIAGNOSIS — K219 Gastro-esophageal reflux disease without esophagitis: Secondary | ICD-10-CM | POA: Diagnosis present

## 2022-12-19 DIAGNOSIS — K220 Achalasia of cardia: Secondary | ICD-10-CM | POA: Diagnosis present

## 2022-12-19 DIAGNOSIS — Z5986 Financial insecurity: Secondary | ICD-10-CM

## 2022-12-19 DIAGNOSIS — I160 Hypertensive urgency: Secondary | ICD-10-CM | POA: Diagnosis present

## 2022-12-19 LAB — URINALYSIS, ROUTINE W REFLEX MICROSCOPIC
Bilirubin Urine: NEGATIVE
Glucose, UA: NEGATIVE mg/dL
Hgb urine dipstick: NEGATIVE
Ketones, ur: NEGATIVE mg/dL
Leukocytes,Ua: NEGATIVE
Nitrite: NEGATIVE
Protein, ur: NEGATIVE mg/dL
Specific Gravity, Urine: 1.01 (ref 1.005–1.030)
pH: 7 (ref 5.0–8.0)

## 2022-12-19 LAB — COMPREHENSIVE METABOLIC PANEL
ALT: 15 U/L (ref 0–44)
AST: 17 U/L (ref 15–41)
Albumin: 3.9 g/dL (ref 3.5–5.0)
Alkaline Phosphatase: 68 U/L (ref 38–126)
Anion gap: 9 (ref 5–15)
BUN: 16 mg/dL (ref 6–20)
CO2: 28 mmol/L (ref 22–32)
Calcium: 9 mg/dL (ref 8.9–10.3)
Chloride: 100 mmol/L (ref 98–111)
Creatinine, Ser: 0.92 mg/dL (ref 0.44–1.00)
GFR, Estimated: >60 mL/min (ref >60)
Glucose, Bld: 98 mg/dL (ref 70–99)
Potassium: 3.7 mmol/L (ref 3.5–5.1)
Sodium: 137 mmol/L (ref 135–145)
Total Bilirubin: 0.6 mg/dL (ref 0.3–1.2)
Total Protein: 7.9 g/dL (ref 6.5–8.1)

## 2022-12-19 LAB — CBC WITH DIFFERENTIAL/PLATELET
Abs Immature Granulocytes: 0.01 10*3/uL (ref 0.00–0.07)
Basophils Absolute: 0 10*3/uL (ref 0.0–0.1)
Basophils Relative: 1 %
Eosinophils Absolute: 0.2 10*3/uL (ref 0.0–0.5)
Eosinophils Relative: 4 %
HCT: 39.2 % (ref 36.0–46.0)
Hemoglobin: 12 g/dL (ref 12.0–15.0)
Immature Granulocytes: 0 %
Lymphocytes Relative: 40 %
Lymphs Abs: 2.1 10*3/uL (ref 0.7–4.0)
MCH: 23 pg — ABNORMAL LOW (ref 26.0–34.0)
MCHC: 30.6 g/dL (ref 30.0–36.0)
MCV: 75.1 fL — ABNORMAL LOW (ref 80.0–100.0)
Monocytes Absolute: 0.6 10*3/uL (ref 0.1–1.0)
Monocytes Relative: 11 %
Neutro Abs: 2.4 10*3/uL (ref 1.7–7.7)
Neutrophils Relative %: 44 %
Platelets: 220 10*3/uL (ref 150–400)
RBC: 5.22 MIL/uL — ABNORMAL HIGH (ref 3.87–5.11)
RDW: 15.1 % (ref 11.5–15.5)
WBC: 5.4 10*3/uL (ref 4.0–10.5)
nRBC: 0 % (ref 0.0–0.2)

## 2022-12-19 LAB — LIPASE, BLOOD: Lipase: 34 U/L (ref 11–51)

## 2022-12-19 LAB — TROPONIN I (HIGH SENSITIVITY)
Troponin I (High Sensitivity): 4 ng/L (ref <18)
Troponin I (High Sensitivity): 5 ng/L (ref <18)

## 2022-12-19 LAB — CK: Total CK: 54 U/L (ref 38–234)

## 2022-12-19 MED ORDER — PROCHLORPERAZINE EDISYLATE 10 MG/2ML IJ SOLN
10.0000 mg | Freq: Once | INTRAMUSCULAR | Status: AC
  Administered 2022-12-19: 10 mg via INTRAVENOUS
  Filled 2022-12-19: qty 2

## 2022-12-19 MED ORDER — LACTATED RINGERS IV BOLUS
1000.0000 mL | Freq: Once | INTRAVENOUS | Status: AC
  Administered 2022-12-19: 1000 mL via INTRAVENOUS

## 2022-12-19 MED ORDER — MORPHINE SULFATE (PF) 2 MG/ML IV SOLN
2.0000 mg | INTRAVENOUS | Status: DC | PRN
  Administered 2022-12-19 – 2022-12-23 (×7): 2 mg via INTRAVENOUS
  Filled 2022-12-19 (×10): qty 1

## 2022-12-19 MED ORDER — LIDOCAINE VISCOUS HCL 2 % MT SOLN
15.0000 mL | Freq: Once | OROMUCOSAL | Status: AC
  Administered 2022-12-19: 15 mL via ORAL
  Filled 2022-12-19: qty 15

## 2022-12-19 MED ORDER — ALUM & MAG HYDROXIDE-SIMETH 200-200-20 MG/5ML PO SUSP
30.0000 mL | Freq: Once | ORAL | Status: AC
  Administered 2022-12-19: 30 mL via ORAL
  Filled 2022-12-19: qty 30

## 2022-12-19 MED ORDER — ALBUTEROL SULFATE (2.5 MG/3ML) 0.083% IN NEBU: 2.5000 mg | INHALATION_SOLUTION | Freq: Four times a day (QID) | RESPIRATORY_TRACT | Status: DC | PRN

## 2022-12-19 MED ORDER — ACETAMINOPHEN 650 MG RE SUPP: 650.0000 mg | Freq: Four times a day (QID) | RECTAL | Status: DC | PRN

## 2022-12-19 MED ORDER — ONDANSETRON HCL 4 MG/2ML IJ SOLN
4.0000 mg | Freq: Four times a day (QID) | INTRAMUSCULAR | Status: DC | PRN
  Administered 2022-12-20 – 2022-12-21 (×4): 4 mg via INTRAVENOUS
  Filled 2022-12-19 (×4): qty 2

## 2022-12-19 MED ORDER — HYDRALAZINE HCL 20 MG/ML IJ SOLN
10.0000 mg | INTRAMUSCULAR | Status: DC | PRN
  Administered 2022-12-20 – 2022-12-21 (×3): 10 mg via INTRAVENOUS
  Filled 2022-12-19 (×3): qty 1

## 2022-12-19 MED ORDER — LACTATED RINGERS IV SOLN: INTRAVENOUS | Status: DC

## 2022-12-19 MED ORDER — HYDRALAZINE HCL 20 MG/ML IJ SOLN
10.0000 mg | INTRAMUSCULAR | Status: DC | PRN
  Administered 2022-12-19: 10 mg via INTRAVENOUS
  Filled 2022-12-19: qty 1

## 2022-12-19 MED ORDER — PANTOPRAZOLE SODIUM 40 MG IV SOLR
40.0000 mg | Freq: Once | INTRAVENOUS | Status: AC
  Administered 2022-12-19: 40 mg via INTRAVENOUS
  Filled 2022-12-19: qty 10

## 2022-12-19 MED ORDER — ACETAMINOPHEN 500 MG PO TABS
1000.0000 mg | ORAL_TABLET | Freq: Four times a day (QID) | ORAL | Status: DC | PRN
  Administered 2022-12-20 – 2022-12-23 (×3): 1000 mg via ORAL
  Filled 2022-12-19 (×3): qty 2

## 2022-12-19 MED ORDER — PANTOPRAZOLE SODIUM 40 MG IV SOLR
40.0000 mg | Freq: Two times a day (BID) | INTRAVENOUS | Status: DC
  Administered 2022-12-20 (×3): 40 mg via INTRAVENOUS
  Filled 2022-12-19: qty 10

## 2022-12-19 MED ORDER — ENOXAPARIN SODIUM 40 MG/0.4ML IJ SOSY
40.0000 mg | PREFILLED_SYRINGE | INTRAMUSCULAR | Status: DC
  Administered 2022-12-20 – 2022-12-23 (×4): 40 mg via SUBCUTANEOUS
  Filled 2022-12-19 (×4): qty 0.4

## 2022-12-19 MED ORDER — KETOROLAC TROMETHAMINE 15 MG/ML IJ SOLN
15.0000 mg | Freq: Once | INTRAMUSCULAR | Status: AC
  Administered 2022-12-19: 15 mg via INTRAVENOUS
  Filled 2022-12-19: qty 1

## 2022-12-19 NOTE — ED Triage Notes (Signed)
Pt arrived via POV, c/o SOB and sternal chest pain. States she was dx with COVID Tuesday. Sx worsening today.

## 2022-12-19 NOTE — ED Provider Notes (Signed)
[place] EMERGENCY DEPARTMENT AT [facility] Provider Note   CSN: [id number] Arrival date & time: 12/19/22  1141     History {Add pertinent medical, surgical, social history, OB history to HPI:1} Chief Complaint  Patient presents with   Chest Pain   Shortness of Breath    Suzanne [name] is a 47 y.o. female.   Chest Pain Associated symptoms: shortness of breath   Shortness of Breath Associated symptoms: chest pain        Home Medications Prior to Admission medications   Medication Sig Start Date End Date Taking? Authorizing Provider  albuterol (PROVENTIL) (2.5 MG/3ML) 0.083% nebulizer solution Take 3 mLs (2.5 mg total) by nebulization every 4 (four) hours as needed for wheezing or shortness of breath. 10/04/19   Hall-Potvin, [place], PA-C  albuterol (PROVENTIL) (2.5 MG/3ML) 0.083% nebulizer solution Take 3 mLs (2.5 mg total) by nebulization every 6 (six) hours as needed for wheezing or shortness of breath. 02/10/22   Janell Quiet, PA-C  amLODipine (NORVASC) 10 MG tablet Take 1 tablet (10 mg total) by mouth daily. 07/31/19   Jarold Motto, PA  carbamazepine (TEGRETOL) 200 MG tablet Take 1 tablet (200 mg total) by mouth daily. 10/22/19   Mare Ferrari, PA-C  cetirizine (ZYRTEC ALLERGY) 10 MG tablet Take 1 tablet (10 mg total) by mouth daily. 10/04/19   Hall-Potvin, [place], PA-C  diclofenac (VOLTAREN) 50 MG EC tablet Take 1 tablet (50 mg total) by mouth 2 (two) times daily. 11/04/19   Cathie Hoops, Amy V, PA-C  fluconazole (DIFLUCAN) 150 MG tablet Take 1 tablet (150 mg total) by mouth every 3 (three) days. 09/17/22   Gustavus Bryant, FNP  hydrochlorothiazide (HYDRODIURIL) 25 MG tablet Take 1 tablet (25 mg total) by mouth daily. 07/31/19   Jarold Motto, PA  ketorolac (TORADOL) 10 MG tablet Take 1 tablet (10 mg total) by mouth every 8 (eight) hours as needed. 09/15/21   Bing Neighbors, NP  metroNIDAZOLE (FLAGYL) 500 MG tablet Take 1 tablet (500 mg total) by mouth 2 (two)  times daily. 04/11/22   Ward, Tylene Fantasia, PA-C  Multiple Vitamins-Calcium (ONE-A-DAY WOMENS FORMULA PO) Take 1 tablet by mouth daily.    [provider]  nitrofurantoin, macrocrystal-monohydrate, (MACROBID) 100 MG capsule Take 1 capsule (100 mg total) by mouth 2 (two) times daily. 09/17/22   Gustavus Bryant, FNP  omeprazole (PRILOSEC) 20 MG capsule Take 1 capsule (20 mg total) by mouth 2 (two) times daily before a meal for 10 days. 06/06/22 06/16/22  Blue, Soijett A, PA-C  ondansetron (ZOFRAN) 4 MG tablet Take 1 tablet (4 mg total) by mouth every 6 (six) hours as needed for nausea or vomiting. 06/06/22   Blue, Soijett A, PA-C  promethazine (PHENERGAN) 25 MG tablet Take 1 tablet (25 mg total) by mouth 2 (two) times daily as needed for nausea or vomiting. 09/28/20   Arnaldo Natal, NP  sucralfate (CARAFATE) 1 GM/10ML suspension Take 10 mLs (1 g total) by mouth 4 (four) times daily -  with meals and at bedtime. 06/06/22   Blue, Soijett A, PA-C  fluticasone (FLONASE) 50 MCG/ACT nasal spray Place 1 spray into both nostrils daily. 10/04/19 11/04/19  Hall-Potvin, [place], PA-C      Allergies    Iodinated contrast media, Reglan [metoclopramide], Amoxicillin, Gabapentin, Pregabalin, Lisinopril, Losartan, and Sulfa antibiotics    Review of Systems   Review of Systems  Respiratory:  Positive for shortness of breath.   Cardiovascular:  Positive for  chest pain.    Physical Exam Updated Vital Signs BP (!) 169/106 (BP Location: Left Arm)   Pulse 86   Temp 98.1 F (36.7 C) (Oral)   Resp 19   SpO2 100%  Physical Exam  ED Results / Procedures / Treatments   Labs (all labs ordered are listed, but only abnormal results are displayed) Labs Reviewed  CBC WITH DIFFERENTIAL/PLATELET - Abnormal; Notable for the following components:      Result Value   RBC 5.22 (*)    MCV 75.1 (*)    MCH 23.0 (*)    All other components within normal limits  COMPREHENSIVE METABOLIC PANEL  URINALYSIS, ROUTINE W  REFLEX MICROSCOPIC  LIPASE, BLOOD  TROPONIN I (HIGH SENSITIVITY)  TROPONIN I (HIGH SENSITIVITY)    EKG EKG Interpretation Date/Time:  Monday December 19 2022 11:53:26 EDT Ventricular Rate:  81 PR Interval:  126 QRS Duration:  81 QT Interval:  372 QTC Calculation: 432 R Axis:   73  Text Interpretation: Sinus rhythm Borderline T wave abnormalities no significant change since Jun 06 2022 Confirmed by Pricilla Loveless [id number]) on 12/19/2022 12:13:02 PM  Radiology DG Chest 2 View  Result Date: 12/19/2022 CLINICAL DATA:  CP/SOB EXAM: CHEST - 2 VIEW COMPARISON:  CXR 02/10/22 FINDINGS: No pleural effusion. No pneumothorax. Normal cardiac and mediastinal contours. No focal airspace opacity. No radiographically apparent displaced rib fractures. Visualized upper abdomen is unremarkable. Vertebral body heights are maintained. IMPRESSION: No focal airspace opacity Electronically Signed   By: Lorenza Cambridge M.D.   On: 12/19/2022 13:05    Procedures Procedures  {Document cardiac monitor, telemetry assessment procedure when appropriate:1}  Medications Ordered in ED Medications  lactated ringers bolus 1,000 mL (1,000 mLs Intravenous New Bag/Given 12/19/22 1424)  ketorolac (TORADOL) 15 MG/ML injection 15 mg (15 mg Intravenous Given 12/19/22 1421)  pantoprazole (PROTONIX) injection 40 mg (40 mg Intravenous Given 12/19/22 1422)  alum & mag hydroxide-simeth (MAALOX/MYLANTA) 200-200-20 MG/5ML suspension 30 mL (30 mLs Oral Given 12/19/22 1421)    And  lidocaine (XYLOCAINE) 2 % viscous mouth solution 15 mL (15 mLs Oral Given 12/19/22 1423)  prochlorperazine (COMPAZINE) injection 10 mg (10 mg Intravenous Given 12/19/22 1422)    ED Course/ Medical Decision Making/ A&P   {   Click here for ABCD2, HEART and other calculatorsREFRESH Note before signing :1}                          Medical Decision Making Amount and/or Complexity of Data Reviewed Labs: ordered. Radiology: ordered.  Risk OTC drugs. Prescription  drug management.   ***  {Document critical care time when appropriate:1} {Document review of labs and clinical decision tools ie heart score, Chads2Vasc2 etc:1}  {Document your independent review of radiology images, and any outside records:1} {Document your discussion with family members, caretakers, and with consultants:1} {Document social determinants of health affecting pt's care:1} {Document your decision making why or why not admission, treatments were needed:1} Final Clinical Impression(s) / ED Diagnoses Final diagnoses:  None    Rx / DC Orders ED Discharge Orders     None

## 2022-12-19 NOTE — ED Notes (Signed)
ED TO INPATIENT HANDOFF REPORT  Name/Age/Gender Suzanne [name] 47 y.o. female  Code Status    Code Status Orders  (From admission, onward)           Start     Ordered   12/19/22 2149  Full code  Continuous       Question:  By:  Answer:  Consent: discussion documented in EHR   12/19/22 2149           Code Status History     This patient has a current code status but no historical code status.       Home/SNF/Other Home  Chief Complaint Abdominal pain with vomiting [R10.9, R11.10]  Level of Care/Admitting Diagnosis ED Disposition     ED Disposition  Admit   Condition  --   Comment  Hospital Area: [facility] [[id number]]  Level of Care: Med-Surg [16]  May place patient in observation at [facility] or [facility] if equivalent level of care is available:: No  Covid Evaluation: Confirmed COVID Positive  Diagnosis: Abdominal pain with vomiting [[id number]]  Admitting Physician: Charlsie Quest [[id number]]  Attending Physician: Charlsie Quest [[id number]]          Medical History Past Medical History:  Diagnosis Date   Abnormal Pap smear of cervix    Allergy    Anemia    Anxiety    Arthritis    Asthma    Chest pain    Chronic lower back pain    Depression    Fibroid    GERD (gastroesophageal reflux disease)    Hypertension    Migraine    Migraines    Neck pain, chronic    Status post dilation of esophageal narrowing    STD (sexually transmitted disease)    HSV1, in eye    Allergies Allergies  Allergen Reactions   Iodinated Contrast Media Rash and Anaphylaxis   Reglan [Metoclopramide] Shortness Of Breath   Amoxicillin Rash    Has patient had a PCN reaction causing immediate rash, facial/tongue/throat swelling, SOB or lightheadedness with hypotension: Yes Has patient had a PCN reaction causing severe rash involving mucus membranes or skin necrosis: Broke out face & buttocks Has patient had a PCN reaction that required  hospitalization: No Has patient had a PCN reaction occurring within the last 10 years: No If all of the above answers are "NO", then may proceed with Cephalosporin use   Gabapentin Swelling   Pregabalin Swelling    Paranoid and anxiety   Lisinopril Cough   Losartan Cough   Sulfa Antibiotics Nausea And Vomiting    IV Location/Drains/Wounds Patient Lines/Drains/Airways Status     Active Line/Drains/Airways     Name Placement date Placement time Site Days   Peripheral IV 12/19/22 20 G Right Antecubital 12/19/22  1322  Antecubital  less than 1            Labs/Imaging Results for orders placed or performed during the hospital encounter of 12/19/22 (from the past 48 hour(s))  CBC with Differential     Status: Abnormal   Collection Time: 12/19/22 12:42 PM  Result Value Ref Range   WBC 5.4 4.0 - 10.5 K/uL   RBC 5.22 (H) 3.87 - 5.11 MIL/uL   Hemoglobin 12.0 12.0 - 15.0 g/dL   HCT 38.7 56.4 - 33.2 %   MCV 75.1 (L) 80.0 - 100.0 fL   MCH 23.0 (L) 26.0 - 34.0 pg   MCHC 30.6 30.0 - 36.0 g/dL  RDW 15.1 11.5 - 15.5 %   Platelets 220 150 - 400 K/uL   nRBC 0.0 0.0 - 0.2 %   Neutrophils Relative % 44 %   Neutro Abs 2.4 1.7 - 7.7 K/uL   Lymphocytes Relative 40 %   Lymphs Abs 2.1 0.7 - 4.0 K/uL   Monocytes Relative 11 %   Monocytes Absolute 0.6 0.1 - 1.0 K/uL   Eosinophils Relative 4 %   Eosinophils Absolute 0.2 0.0 - 0.5 K/uL   Basophils Relative 1 %   Basophils Absolute 0.0 0.0 - 0.1 K/uL   Immature Granulocytes 0 %   Abs Immature Granulocytes 0.01 0.00 - 0.07 K/uL    Comment: Performed at [facility], [address] [place], [place] [address]  Comprehensive metabolic panel     Status: None   Collection Time: 12/19/22 12:42 PM  Result Value Ref Range   Sodium 137 135 - 145 mmol/L   Potassium 3.7 3.5 - 5.1 mmol/L   Chloride 100 98 - 111 mmol/L   CO2 28 22 - 32 mmol/L   Glucose, Bld 98 70 - 99 mg/dL    Comment: Glucose reference range applies only to  samples taken after fasting for at least 8 hours.   BUN 16 6 - 20 mg/dL   Creatinine, Ser 2.13 0.44 - 1.00 mg/dL   Calcium 9.0 8.9 - 08.6 mg/dL   Total Protein 7.9 6.5 - 8.1 g/dL   Albumin 3.9 3.5 - 5.0 g/dL   AST 17 15 - 41 U/L   ALT 15 0 - 44 U/L   Alkaline Phosphatase 68 38 - 126 U/L   Total Bilirubin 0.6 0.3 - 1.2 mg/dL   GFR, Estimated >57 >84 mL/min    Comment: (NOTE) Calculated using the CKD-EPI Creatinine Equation (2021)    Anion gap 9 5 - 15    Comment: Performed at [facility], [address] [place], [place] [address]  Troponin I (High Sensitivity)     Status: None   Collection Time: 12/19/22 12:42 PM  Result Value Ref Range   Troponin I (High Sensitivity) 4 <18 ng/L    Comment: (NOTE) Elevated high sensitivity troponin I (hsTnI) values and significant  changes across serial measurements may suggest ACS but many other  chronic and acute conditions are known to elevate hsTnI results.  Refer to the "Links" section for chest pain algorithms and additional  guidance. Performed at [facility], [address] [place], [place] [address]   Lipase, blood     Status: None   Collection Time: 12/19/22  1:22 PM  Result Value Ref Range   Lipase 34 11 - 51 U/L    Comment: Performed at [facility], [address], [place], [place] [address]  Urinalysis, Routine w reflex microscopic -Urine, Clean Catch     Status: None   Collection Time: 12/19/22  2:38 PM  Result Value Ref Range   Color, Urine YELLOW YELLOW   APPearance CLEAR CLEAR   Specific Gravity, Urine 1.010 1.005 - 1.030   pH 7.0 5.0 - 8.0   Glucose, UA NEGATIVE NEGATIVE mg/dL   Hgb urine dipstick NEGATIVE NEGATIVE   Bilirubin Urine NEGATIVE NEGATIVE   Ketones, ur NEGATIVE NEGATIVE mg/dL   Protein, ur NEGATIVE NEGATIVE mg/dL   Nitrite NEGATIVE NEGATIVE   Leukocytes,Ua NEGATIVE NEGATIVE    Comment: Performed at [facility], [address], [place], [place] [address]  Troponin I (High Sensitivity)  Status: None   Collection Time: 12/19/22  2:38 PM  Result Value Ref Range   Troponin I (High Sensitivity) 5 <18 ng/L    Comment: (NOTE) Elevated high sensitivity troponin I (hsTnI) values and significant  changes across serial measurements may suggest ACS but many other  chronic and acute conditions are known to elevate hsTnI results.  Refer to the "Links" section for chest pain algorithms and additional  guidance. Performed at [facility], [address], [place], [place] [address]   CK     Status: None   Collection Time: 12/19/22  8:58 PM  Result Value Ref Range   Total CK 54 38 - 234 U/L    Comment: Performed at [facility], [address] [place], [place] [address]   DG Chest 2 View  Result Date: 12/19/2022 CLINICAL DATA:  CP/SOB EXAM: CHEST - 2 VIEW COMPARISON:  CXR 02/10/22 FINDINGS: No pleural effusion. No pneumothorax. Normal cardiac and mediastinal contours. No focal airspace opacity. No radiographically apparent displaced rib fractures. Visualized upper abdomen is unremarkable. Vertebral body heights are maintained. IMPRESSION: No focal airspace opacity Electronically Signed   By: Lorenza Cambridge M.D.   On: 12/19/2022 13:05    Pending Labs Unresulted Labs (From admission, onward)     Start     Ordered   12/20/22 0500  HIV Antibody (routine testing w rflx)  (HIV Antibody (Routine testing w reflex) panel)  Tomorrow morning,   R        12/19/22 2149   12/20/22 0500  CBC  Tomorrow morning,   R        12/19/22 2149   12/20/22 0500  Comprehensive metabolic panel  Tomorrow morning,   R        12/19/22 2149            Vitals/Pain Today's Vitals   12/19/22 2115 12/19/22 2130 12/19/22 2200 12/19/22 2215  BP: (!) 178/94 (!) 172/84 (!) 181/100 (!) 182/106  Pulse: (!) 55 (!) 59 (!) 59 66  Resp: 20 19 15 15   Temp:      TempSrc:      SpO2: 96% 98% 100% 100%    Isolation  Precautions No active isolations  Medications Medications  ondansetron (ZOFRAN) injection 4 mg (has no administration in time range)  enoxaparin (LOVENOX) injection 40 mg (has no administration in time range)  lactated ringers infusion ( Intravenous New Bag/Given 12/19/22 2229)  acetaminophen (TYLENOL) tablet 1,000 mg (has no administration in time range)    Or  acetaminophen (TYLENOL) suppository 650 mg (has no administration in time range)  albuterol (PROVENTIL) (2.5 MG/3ML) 0.083% nebulizer solution 2.5 mg (has no administration in time range)  pantoprazole (PROTONIX) injection 40 mg (has no administration in time range)  hydrALAZINE (APRESOLINE) injection 10 mg (10 mg Intravenous Given 12/19/22 2227)  lactated ringers bolus 1,000 mL (1,000 mLs Intravenous New Bag/Given 12/19/22 1424)  ketorolac (TORADOL) 15 MG/ML injection 15 mg (15 mg Intravenous Given 12/19/22 1421)  pantoprazole (PROTONIX) injection 40 mg (40 mg Intravenous Given 12/19/22 1422)  alum & mag hydroxide-simeth (MAALOX/MYLANTA) 200-200-20 MG/5ML suspension 30 mL (30 mLs Oral Given 12/19/22 1421)    And  lidocaine (XYLOCAINE) 2 % viscous mouth solution 15 mL (15 mLs Oral Given 12/19/22 1423)  prochlorperazine (COMPAZINE) injection 10 mg (10 mg Intravenous Given 12/19/22 1422)  lactated ringers bolus 1,000 mL (1,000 mLs Intravenous New Bag/Given 12/19/22 1722)    Mobility walks

## 2022-12-19 NOTE — H&P (Signed)
History and Physical    [name]:[id number] DOB: 12-Dec-1975 DOA: 12/19/2022  PCP: Patient, No Pcp Per  Patient coming from: Home  I have personally briefly reviewed patient's old medical records in [facility]  Chief Complaint: Abdominal pain, nausea, vomiting  HPI: Suzanne [name] is a 47 y.o. female with medical history significant for asthma, HTN, iron deficiency anemia, GERD, depression/anxiety who presents to the ED for evaluation of generalized weakness and nausea.  Initially had symptoms of fevers, hot flashes, nausea, and cough beginning last Monday.  Patient reportedly tested positive for COVID-19 on Tuesday 12/13/2022.  Next day she had worsening nausea and vomiting and had not been able to maintain adequate oral intake.    She had continued fevers which began to subside after Friday.  She was started on a course of Paxlovid on Friday 7/19.  She has been generally weak and fatigued which has been progressively worsening.  She has been having persistent epigastric pain which she says radiates straight to her back.    She has been having loose stools.  She says anything she tries to eat runs right through her.  She had some shortness of breath last week which improved with her home albuterol nebulizer treatment.  She denies any recent tobacco or alcohol use.  ED Course  Labs/Imaging on admission: I have personally reviewed following labs and imaging studies.  Initial vitals showed BP 161/108, pulse 66, RR 20, temp 98.1 F, SpO2 96% on room air.  Labs show WBC 5.4, hemoglobin 12.0, platelets 220,000, sodium 137, potassium 3.7, bicarb 28, BUN 16, creatinine 0.92, serum glucose 98, LFTs within normal limits, troponin negative x 2, lipase 34.  UA negative.  2 view chest x-ray negative for focal consolidation, edema, effusion.  Patient was given 2 L LR, IV Toradol 15 mg, IV Protonix 40 mg, GI cocktail.  Patient was noted to be generally weak and unable to ambulate on  her own or stand for orthostatic vitals.  He was significant fatigue and generalized weakness the hospitalist service was consulted to admit for further evaluation and management.  Review of Systems: All systems reviewed and are negative except as documented in history of present illness above.   Past Medical History:  Diagnosis Date   Abnormal Pap smear of cervix    Allergy    Anemia    Anxiety    Arthritis    Asthma    Chest pain    Chronic lower back pain    Depression    Fibroid    GERD (gastroesophageal reflux disease)    Hypertension    Migraine    Migraines    Neck pain, chronic    Status post dilation of esophageal narrowing    STD (sexually transmitted disease)    HSV1, in eye    Past Surgical History:  Procedure Laterality Date   COLPOSCOPY     ESOPHAGEAL DILATION     ESOPHAGEAL MANOMETRY N/A 04/01/2013   Procedure: ESOPHAGEAL MANOMETRY (EM);  Surgeon: Rachael Fee, MD;  Location: [facility];  Service: Endoscopy;  Laterality: N/A;   ESOPHAGOGASTRODUODENOSCOPY (EGD) WITH ESOPHAGEAL DILATION N/A 02/28/2013   Procedure: ESOPHAGOGASTRODUODENOSCOPY (EGD) WITH ESOPHAGEAL DILATION;  Surgeon: Rachael Fee, MD;  Location: [facility];  Service: Endoscopy;  Laterality: N/A;   TUBAL LIGATION     VAGINAL HYSTERECTOMY      Social History:  reports that she quit smoking about 5 years ago. Her smoking use included cigars. She started smoking  about 15 years ago. She has a 1 pack-year smoking history. She has never used smokeless tobacco. She reports that she does not currently use alcohol. She reports that she does not currently use drugs after having used the following drugs: Marijuana.  Allergies  Allergen Reactions   Iodinated Contrast Media Rash and Anaphylaxis   Reglan [Metoclopramide] Shortness Of Breath   Amoxicillin Rash    Has patient had a PCN reaction causing immediate rash, facial/tongue/throat swelling, SOB or lightheadedness with hypotension: Yes Has  patient had a PCN reaction causing severe rash involving mucus membranes or skin necrosis: Broke out face & buttocks Has patient had a PCN reaction that required hospitalization: No Has patient had a PCN reaction occurring within the last 10 years: No If all of the above answers are "NO", then may proceed with Cephalosporin use   Gabapentin Swelling   Pregabalin Swelling    Paranoid and anxiety   Lisinopril Cough   Losartan Cough   Sulfa Antibiotics Nausea And Vomiting    Family History  Problem Relation Age of Onset   Heart disease Father        H/O CABG   Hypertension Mother    Hypertension Maternal Grandmother    Thyroid disease Maternal Grandmother    Diabetes Maternal Grandmother    Hypertension Paternal Grandmother    Diabetes Paternal Grandmother    Cancer - Lung Paternal Grandmother    Hypertension Maternal Aunt    Thyroid disease Maternal Aunt    Esophageal cancer Other        age unknow   Colon cancer Neg Hx    Stomach cancer Neg Hx      Prior to Admission medications   Medication Sig Start Date End Date Taking? Authorizing Provider  albuterol (PROVENTIL) (2.5 MG/3ML) 0.083% nebulizer solution Take 3 mLs (2.5 mg total) by nebulization every 4 (four) hours as needed for wheezing or shortness of breath. 10/04/19   Hall-Potvin, [place], PA-C  albuterol (PROVENTIL) (2.5 MG/3ML) 0.083% nebulizer solution Take 3 mLs (2.5 mg total) by nebulization every 6 (six) hours as needed for wheezing or shortness of breath. 02/10/22   Janell Quiet, PA-C  amLODipine (NORVASC) 10 MG tablet Take 1 tablet (10 mg total) by mouth daily. 07/31/19   Jarold Motto, PA  carbamazepine (TEGRETOL) 200 MG tablet Take 1 tablet (200 mg total) by mouth daily. 10/22/19   Mare Ferrari, PA-C  cetirizine (ZYRTEC ALLERGY) 10 MG tablet Take 1 tablet (10 mg total) by mouth daily. 10/04/19   Hall-Potvin, [place], PA-C  diclofenac (VOLTAREN) 50 MG EC tablet Take 1 tablet (50 mg total) by mouth 2 (two) times  daily. 11/04/19   Cathie Hoops, Amy V, PA-C  fluconazole (DIFLUCAN) 150 MG tablet Take 1 tablet (150 mg total) by mouth every 3 (three) days. 09/17/22   Gustavus Bryant, FNP  hydrochlorothiazide (HYDRODIURIL) 25 MG tablet Take 1 tablet (25 mg total) by mouth daily. 07/31/19   Jarold Motto, PA  ketorolac (TORADOL) 10 MG tablet Take 1 tablet (10 mg total) by mouth every 8 (eight) hours as needed. 09/15/21   Bing Neighbors, NP  metroNIDAZOLE (FLAGYL) 500 MG tablet Take 1 tablet (500 mg total) by mouth 2 (two) times daily. 04/11/22   Ward, Tylene Fantasia, PA-C  Multiple Vitamins-Calcium (ONE-A-DAY WOMENS FORMULA PO) Take 1 tablet by mouth daily.    [provider]  nitrofurantoin, macrocrystal-monohydrate, (MACROBID) 100 MG capsule Take 1 capsule (100 mg total) by mouth 2 (two) times daily. 09/17/22  Mound, [place] E, FNP  omeprazole (PRILOSEC) 20 MG capsule Take 1 capsule (20 mg total) by mouth 2 (two) times daily before a meal for 10 days. 06/06/22 06/16/22  Blue, Soijett A, PA-C  ondansetron (ZOFRAN) 4 MG tablet Take 1 tablet (4 mg total) by mouth every 6 (six) hours as needed for nausea or vomiting. 06/06/22   Blue, Soijett A, PA-C  promethazine (PHENERGAN) 25 MG tablet Take 1 tablet (25 mg total) by mouth 2 (two) times daily as needed for nausea or vomiting. 09/28/20   Arnaldo Natal, NP  sucralfate (CARAFATE) 1 GM/10ML suspension Take 10 mLs (1 g total) by mouth 4 (four) times daily -  with meals and at bedtime. 06/06/22   Blue, Soijett A, PA-C  fluticasone (FLONASE) 50 MCG/ACT nasal spray Place 1 spray into both nostrils daily. 10/04/19 11/04/19  Hall-Potvin, [place], PA-C    Physical Exam: Vitals:   12/19/22 2000 12/19/22 2057 12/19/22 2115 12/19/22 2130  BP: (!) 156/112  (!) 178/94 (!) 172/84  Pulse: (!) 58  (!) 55 (!) 59  Resp: 10  20 19   Temp:  98.3 F (36.8 C)    TempSrc:  Oral    SpO2: 98%  96% 98%   Constitutional: Resting in bed, appears fatigued, calm Eyes: EOMI, lids and conjunctivae  normal ENMT: Mucous membranes are dry. Posterior pharynx clear of any exudate or lesions.Normal dentition.  Neck: normal, supple, no masses. Respiratory: clear to auscultation bilaterally, no wheezing, no crackles. Normal respiratory effort. No accessory muscle use.  Cardiovascular: Regular rate and rhythm, systolic murmur present. No extremity edema. 2+ pedal pulses. Abdomen: Epigastric tenderness to light palpation Musculoskeletal: no clubbing / cyanosis. No joint deformity upper and lower extremities. Good ROM, no contractures. Normal muscle tone.  Skin: no rashes, lesions, ulcers. No induration Neurologic:  Sensation intact. Strength 5/5 in all 4.  Psychiatric: Alert and oriented x 3. Normal mood.   EKG: Personally reviewed. Sinus rhythm, rate 81, no acute ischemic changes.  Assessment/Plan Principal Problem:   Abdominal pain with vomiting Active Problems:   Essential hypertension   COVID-19 virus infection   Asthma   REATA PETROV is a 47 y.o. female with medical history significant for asthma, HTN, iron deficiency anemia, GERD, depression/anxiety with recent COVID-19 viral infection who was admitted for abdominal pain associated with nausea and vomiting.  Assessment and Plan: Abdominal pain with nausea and vomiting: COVID-19 viral infection with persistent nausea, vomiting, and epigastric pain.  Lipase is negative.  LFTs within normal limits.  Troponin negative x 2.  No leukocytosis noted.  UA negative.  Suspect viral gastroenteritis and/or gastritis.  History of marijuana use, check UDS. -Continue IV fluid hydration overnight -Continue antiemetics as needed -Continue IV Protonix BID -Keep n.p.o. tonight, advance diet as tolerated  Generalized weakness: Deconditioned due to COVID-19 viral infection and dehydration from GI losses and poor oral intake.  CK is normal.  No focal weakness. -PT eval in a.m.  COVID-19 viral infection: Reported positive test on 7/16.  Has been on  Paxlovid.  Potentially contributing to gastroenteritis symptoms.  No significant respiratory symptoms.  CXR negative for pneumonia/infiltrates.  Hypertension: IV hydralazine ordered as needed.  Resume home meds once tolerating oral meds.  Asthma: Stable without wheezing on admission.  Continue albuterol as needed.   DVT prophylaxis: enoxaparin (LOVENOX) injection 40 mg Start: 12/20/22 2200 Code Status: Full code Family Communication: Discussed with patient, she has discussed with family Disposition Plan: From home and likely discharge to home  pending clinical progress Consults called: None Severity of Illness: The appropriate patient status for this patient is OBSERVATION. Observation status is judged to be reasonable and necessary in order to provide the required intensity of service to ensure the patient's safety. The patient's presenting symptoms, physical exam findings, and initial radiographic and laboratory data in the context of their medical condition is felt to place them at decreased risk for further clinical deterioration. Furthermore, it is anticipated that the patient will be medically stable for discharge from the hospital within 2 midnights of admission.   Darreld Mclean MD [facility]  If 7PM-7AM, please contact night-coverage [email or web address]  12/19/2022, 9:54 PM

## 2022-12-19 NOTE — Hospital Course (Signed)
Suzanne [name] is a 47 y.o. female with medical history significant for asthma, HTN, iron deficiency anemia, GERD, depression/anxiety with recent COVID-19 viral infection who was admitted for abdominal pain associated with nausea and vomiting.

## 2022-12-19 NOTE — ED Provider Notes (Incomplete)
[place] EMERGENCY DEPARTMENT AT [facility] Provider Note   CSN: [id number] Arrival date & time: 12/19/22  1141     History Chief Complaint  Patient presents with  . Chest Pain  . Shortness of Breath    Suzanne [name] is a 47 y.o. female with history of hypertension, asthma, esophageal stricture presents emerged from today for evaluation of prolonged COVID symptoms.  On Tuesday, patient was diagnosed with COVID.  She had headache, fever Tmax 102, rhinorrhea, nasal ingestion, productive cough.  The next day on Wednesday she started having nausea, vomiting, and felt fatigued.  She continued to feel like this.  Started taking Paxlovid on Friday.  Symptoms for her headache, rhinorrhea, excision, cough, and fever improved.  She has not had a fever since Friday.  She reports that she started to have some epigastric pain starting on Thursday after the nausea and vomiting.  She also reports she had 2 episodes of soft stool that is not black or bloody.  She reports lightheadedness but denies any room spinning sensation.  Denies any syncope.  She denies any dysuria or hematuria.  She is still able to urinate multiple times a day.  She reports that she is only been eating soup as she has a decrease in appetite.  She reports that if she gets up and tries to move around she feels nauseous and will vomit.  She denies any visual changes or any focal weakness.  She denies any trouble walking or trouble talking.  She does complain of some chest pain that she has.  Central.  Nonradiating.  She also endorses some shortness of breath however this sounds more like fatigue with ambulation.  She reports that she is feels like she is unable to do much for herself given that she feels very rundown.  She has tried Zofran at home with her by her primary care doctor without much relief of symptoms.   Chest Pain Associated symptoms: abdominal pain, cough, fatigue, fever (No longer febrile for the past few  days), headache, nausea, shortness of breath and vomiting   Associated symptoms: no dizziness and no palpitations   Shortness of Breath Associated symptoms: abdominal pain, chest pain, cough, fever (No longer febrile for the past few days), headaches and vomiting   Associated symptoms: no sore throat and no wheezing        Home Medications Prior to Admission medications   Medication Sig Start Date End Date Taking? Authorizing Provider  albuterol (PROVENTIL) (2.5 MG/3ML) 0.083% nebulizer solution Take 3 mLs (2.5 mg total) by nebulization every 4 (four) hours as needed for wheezing or shortness of breath. 10/04/19   Hall-Potvin, [place], PA-C  albuterol (PROVENTIL) (2.5 MG/3ML) 0.083% nebulizer solution Take 3 mLs (2.5 mg total) by nebulization every 6 (six) hours as needed for wheezing or shortness of breath. 02/10/22   Janell Quiet, PA-C  amLODipine (NORVASC) 10 MG tablet Take 1 tablet (10 mg total) by mouth daily. 07/31/19   Jarold Motto, PA  carbamazepine (TEGRETOL) 200 MG tablet Take 1 tablet (200 mg total) by mouth daily. 10/22/19   Mare Ferrari, PA-C  cetirizine (ZYRTEC ALLERGY) 10 MG tablet Take 1 tablet (10 mg total) by mouth daily. 10/04/19   Hall-Potvin, [place], PA-C  diclofenac (VOLTAREN) 50 MG EC tablet Take 1 tablet (50 mg total) by mouth 2 (two) times daily. 11/04/19   Cathie Hoops, Amy V, PA-C  fluconazole (DIFLUCAN) 150 MG tablet Take 1 tablet (150 mg total) by mouth every  3 (three) days. 09/17/22   Gustavus Bryant, FNP  hydrochlorothiazide (HYDRODIURIL) 25 MG tablet Take 1 tablet (25 mg total) by mouth daily. 07/31/19   Jarold Motto, PA  ketorolac (TORADOL) 10 MG tablet Take 1 tablet (10 mg total) by mouth every 8 (eight) hours as needed. 09/15/21   Bing Neighbors, NP  metroNIDAZOLE (FLAGYL) 500 MG tablet Take 1 tablet (500 mg total) by mouth 2 (two) times daily. 04/11/22   Ward, Tylene Fantasia, PA-C  Multiple Vitamins-Calcium (ONE-A-DAY WOMENS FORMULA PO) Take 1 tablet by mouth daily.     [provider]  nitrofurantoin, macrocrystal-monohydrate, (MACROBID) 100 MG capsule Take 1 capsule (100 mg total) by mouth 2 (two) times daily. 09/17/22   Gustavus Bryant, FNP  omeprazole (PRILOSEC) 20 MG capsule Take 1 capsule (20 mg total) by mouth 2 (two) times daily before a meal for 10 days. 06/06/22 06/16/22  Blue, Soijett A, PA-C  ondansetron (ZOFRAN) 4 MG tablet Take 1 tablet (4 mg total) by mouth every 6 (six) hours as needed for nausea or vomiting. 06/06/22   Blue, Soijett A, PA-C  promethazine (PHENERGAN) 25 MG tablet Take 1 tablet (25 mg total) by mouth 2 (two) times daily as needed for nausea or vomiting. 09/28/20   Arnaldo Natal, NP  sucralfate (CARAFATE) 1 GM/10ML suspension Take 10 mLs (1 g total) by mouth 4 (four) times daily -  with meals and at bedtime. 06/06/22   Blue, Soijett A, PA-C  fluticasone (FLONASE) 50 MCG/ACT nasal spray Place 1 spray into both nostrils daily. 10/04/19 11/04/19  Hall-Potvin, [place], PA-C      Allergies    Iodinated contrast media, Reglan [metoclopramide], Amoxicillin, Gabapentin, Pregabalin, Lisinopril, Losartan, and Sulfa antibiotics    Review of Systems   Review of Systems  Constitutional:  Positive for fatigue and fever (No longer febrile for the past few days).  HENT:  Positive for congestion and rhinorrhea. Negative for sore throat.   Respiratory:  Positive for cough and shortness of breath. Negative for wheezing.   Cardiovascular:  Positive for chest pain. Negative for palpitations and leg swelling.  Gastrointestinal:  Positive for abdominal pain, diarrhea, nausea and vomiting. Negative for blood in stool and constipation.  Genitourinary:  Negative for dysuria and hematuria.  Musculoskeletal:  Positive for myalgias.  Neurological:  Positive for light-headedness and headaches. Negative for dizziness.    Physical Exam Updated Vital Signs BP (!) 169/106 (BP Location: Left Arm)   Pulse 86   Temp 98.1 F (36.7 C) (Oral)   Resp 19    SpO2 100%  Physical Exam Vitals and nursing note reviewed.  Constitutional:      Comments: Uncomfortable, but nontoxic-appearing  Eyes:     General: No scleral icterus.    Extraocular Movements: Extraocular movements intact.     Pupils: Pupils are equal, round, and reactive to light.  Cardiovascular:     Rate and Rhythm: Normal rate.     Pulses:          Radial pulses are 2+ on the right side and 2+ on the left side.       Dorsalis pedis pulses are 2+ on the right side.       Posterior tibial pulses are 2+ on the right side and 2+ on the left side.  Pulmonary:     Effort: Pulmonary effort is normal. No respiratory distress.     Breath sounds: No decreased breath sounds, wheezing or rhonchi.  Chest:  Chest wall: No tenderness.  Abdominal:     Palpations: Abdomen is soft.     Tenderness: There is abdominal tenderness. There is no guarding or rebound.     Comments: Some epigastric tenderness palpation.  Soft.  Normal active bowel sounds.  No guarding or rebound.  No CVA tenderness to percussion.  Musculoskeletal:     Right lower leg: No tenderness. No edema.     Left lower leg: No tenderness. No edema.  Skin:    General: Skin is warm and dry.  Neurological:     General: No focal deficit present.     Mental Status: She is alert.     GCS: GCS eye subscore is 4. GCS verbal subscore is 5. GCS motor subscore is 6.     Cranial Nerves: No cranial nerve deficit, dysarthria or facial asymmetry.     Motor: Weakness present.     Comments: Generalized weakness, but no focal weakness.  Sensation reportedly intact symmetric in upper and lower bilateral extremities per patient.     ED Results / Procedures / Treatments   Labs (all labs ordered are listed, but only abnormal results are displayed) Labs Reviewed  CBC WITH DIFFERENTIAL/PLATELET - Abnormal; Notable for the following components:      Result Value   RBC 5.22 (*)    MCV 75.1 (*)    MCH 23.0 (*)    All other components  within normal limits  COMPREHENSIVE METABOLIC PANEL  URINALYSIS, ROUTINE W REFLEX MICROSCOPIC  LIPASE, BLOOD  TROPONIN I (HIGH SENSITIVITY)  TROPONIN I (HIGH SENSITIVITY)    EKG EKG Interpretation Date/Time:  Monday December 19 2022 11:53:26 EDT Ventricular Rate:  81 PR Interval:  126 QRS Duration:  81 QT Interval:  372 QTC Calculation: 432 R Axis:   73  Text Interpretation: Sinus rhythm Borderline T wave abnormalities no significant change since Jun 06 2022 Confirmed by Pricilla Loveless [phone]) on 12/19/2022 12:13:02 PM  Radiology DG Chest 2 View  Result Date: 12/19/2022 CLINICAL DATA:  CP/SOB EXAM: CHEST - 2 VIEW COMPARISON:  CXR 02/10/22 FINDINGS: No pleural effusion. No pneumothorax. Normal cardiac and mediastinal contours. No focal airspace opacity. No radiographically apparent displaced rib fractures. Visualized upper abdomen is unremarkable. Vertebral body heights are maintained. IMPRESSION: No focal airspace opacity Electronically Signed   By: Lorenza Cambridge M.D.   On: 12/19/2022 13:05    Procedures Procedures   Medications Ordered in ED Medications  lactated ringers bolus 1,000 mL (1,000 mLs Intravenous New Bag/Given 12/19/22 1424)  ketorolac (TORADOL) 15 MG/ML injection 15 mg (15 mg Intravenous Given 12/19/22 1421)  pantoprazole (PROTONIX) injection 40 mg (40 mg Intravenous Given 12/19/22 1422)  alum & mag hydroxide-simeth (MAALOX/MYLANTA) 200-200-20 MG/5ML suspension 30 mL (30 mLs Oral Given 12/19/22 1421)    And  lidocaine (XYLOCAINE) 2 % viscous mouth solution 15 mL (15 mLs Oral Given 12/19/22 1423)  prochlorperazine (COMPAZINE) injection 10 mg (10 mg Intravenous Given 12/19/22 1422)    ED Course/ Medical Decision Making/ A&P                           Medical Decision Making Amount and/or Complexity of Data Reviewed Labs: ordered. Radiology: ordered.  Risk OTC drugs. Prescription drug management. Decision regarding hospitalization.   47 y.o. female presents to the  ER for evaluation of ***. Differential diagnosis includes but is not limited to ***. Vital signs ***. Physical exam as noted above.   I independently reviewed  and interpreted the patient's labs. ***.  I discussed this case with my attending physician who cosigned this note including patient's presenting symptoms, physical exam, and planned diagnostics and interventions. Attending physician stated agreement with plan or made changes to plan which were implemented.   Attending physician assessed patient at bedside.  Portions of this report may have been transcribed using voice recognition software. Every effort was made to ensure accuracy; however, inadvertent computerized transcription errors may be present.   Final Clinical Impression(s) / ED Diagnoses Final diagnoses:  COVID  Generalized weakness  Nausea  Epigastric pain    Rx / DC Orders ED Discharge Orders     None

## 2022-12-20 ENCOUNTER — Telehealth: Payer: Self-pay

## 2022-12-20 ENCOUNTER — Other Ambulatory Visit: Payer: Self-pay

## 2022-12-20 DIAGNOSIS — Z800 Family history of malignant neoplasm of digestive organs: Secondary | ICD-10-CM | POA: Diagnosis not present

## 2022-12-20 DIAGNOSIS — Z888 Allergy status to other drugs, medicaments and biological substances status: Secondary | ICD-10-CM | POA: Diagnosis not present

## 2022-12-20 DIAGNOSIS — U071 COVID-19: Secondary | ICD-10-CM | POA: Diagnosis present

## 2022-12-20 DIAGNOSIS — I10 Essential (primary) hypertension: Secondary | ICD-10-CM | POA: Diagnosis present

## 2022-12-20 DIAGNOSIS — K220 Achalasia of cardia: Secondary | ICD-10-CM | POA: Diagnosis present

## 2022-12-20 DIAGNOSIS — R109 Unspecified abdominal pain: Secondary | ICD-10-CM | POA: Diagnosis not present

## 2022-12-20 DIAGNOSIS — Z8616 Personal history of COVID-19: Secondary | ICD-10-CM | POA: Diagnosis not present

## 2022-12-20 DIAGNOSIS — G43909 Migraine, unspecified, not intractable, without status migrainosus: Secondary | ICD-10-CM | POA: Diagnosis present

## 2022-12-20 DIAGNOSIS — Z87891 Personal history of nicotine dependence: Secondary | ICD-10-CM | POA: Diagnosis not present

## 2022-12-20 DIAGNOSIS — Y92009 Unspecified place in unspecified non-institutional (private) residence as the place of occurrence of the external cause: Secondary | ICD-10-CM | POA: Diagnosis not present

## 2022-12-20 DIAGNOSIS — F32A Depression, unspecified: Secondary | ICD-10-CM | POA: Diagnosis present

## 2022-12-20 DIAGNOSIS — E860 Dehydration: Secondary | ICD-10-CM | POA: Diagnosis present

## 2022-12-20 DIAGNOSIS — K521 Toxic gastroenteritis and colitis: Secondary | ICD-10-CM | POA: Diagnosis present

## 2022-12-20 DIAGNOSIS — K222 Esophageal obstruction: Secondary | ICD-10-CM | POA: Diagnosis present

## 2022-12-20 DIAGNOSIS — R1310 Dysphagia, unspecified: Secondary | ICD-10-CM | POA: Diagnosis present

## 2022-12-20 DIAGNOSIS — R1319 Other dysphagia: Secondary | ICD-10-CM | POA: Diagnosis not present

## 2022-12-20 DIAGNOSIS — K219 Gastro-esophageal reflux disease without esophagitis: Secondary | ICD-10-CM | POA: Diagnosis present

## 2022-12-20 DIAGNOSIS — Z91041 Radiographic dye allergy status: Secondary | ICD-10-CM | POA: Diagnosis not present

## 2022-12-20 DIAGNOSIS — R110 Nausea: Secondary | ICD-10-CM | POA: Diagnosis present

## 2022-12-20 DIAGNOSIS — K224 Dyskinesia of esophagus: Secondary | ICD-10-CM

## 2022-12-20 DIAGNOSIS — D509 Iron deficiency anemia, unspecified: Secondary | ICD-10-CM | POA: Diagnosis present

## 2022-12-20 DIAGNOSIS — Z8249 Family history of ischemic heart disease and other diseases of the circulatory system: Secondary | ICD-10-CM | POA: Diagnosis not present

## 2022-12-20 DIAGNOSIS — J45909 Unspecified asthma, uncomplicated: Secondary | ICD-10-CM | POA: Diagnosis present

## 2022-12-20 DIAGNOSIS — R1110 Vomiting, unspecified: Secondary | ICD-10-CM | POA: Diagnosis not present

## 2022-12-20 DIAGNOSIS — Z90710 Acquired absence of both cervix and uterus: Secondary | ICD-10-CM | POA: Diagnosis not present

## 2022-12-20 DIAGNOSIS — Z79899 Other long term (current) drug therapy: Secondary | ICD-10-CM | POA: Diagnosis not present

## 2022-12-20 DIAGNOSIS — Z882 Allergy status to sulfonamides status: Secondary | ICD-10-CM | POA: Diagnosis not present

## 2022-12-20 DIAGNOSIS — F419 Anxiety disorder, unspecified: Secondary | ICD-10-CM | POA: Diagnosis present

## 2022-12-20 DIAGNOSIS — Z833 Family history of diabetes mellitus: Secondary | ICD-10-CM | POA: Diagnosis not present

## 2022-12-20 DIAGNOSIS — I160 Hypertensive urgency: Secondary | ICD-10-CM | POA: Diagnosis present

## 2022-12-20 LAB — COMPREHENSIVE METABOLIC PANEL
ALT: 15 U/L (ref 0–44)
AST: 17 U/L (ref 15–41)
Albumin: 3.5 g/dL (ref 3.5–5.0)
Alkaline Phosphatase: 58 U/L (ref 38–126)
Anion gap: 7 (ref 5–15)
BUN: 10 mg/dL (ref 6–20)
CO2: 25 mmol/L (ref 22–32)
Calcium: 8.9 mg/dL (ref 8.9–10.3)
Chloride: 104 mmol/L (ref 98–111)
Creatinine, Ser: 0.73 mg/dL (ref 0.44–1.00)
GFR, Estimated: >60 mL/min (ref >60)
Glucose, Bld: 96 mg/dL (ref 70–99)
Potassium: 3.7 mmol/L (ref 3.5–5.1)
Sodium: 136 mmol/L (ref 135–145)
Total Bilirubin: 0.7 mg/dL (ref 0.3–1.2)
Total Protein: 6.8 g/dL (ref 6.5–8.1)

## 2022-12-20 LAB — CBC
HCT: 35.1 % — ABNORMAL LOW (ref 36.0–46.0)
Hemoglobin: 11 g/dL — ABNORMAL LOW (ref 12.0–15.0)
MCH: 23 pg — ABNORMAL LOW (ref 26.0–34.0)
MCHC: 31.3 g/dL (ref 30.0–36.0)
MCV: 73.4 fL — ABNORMAL LOW (ref 80.0–100.0)
Platelets: 182 10*3/uL (ref 150–400)
RBC: 4.78 MIL/uL (ref 3.87–5.11)
RDW: 14.4 % (ref 11.5–15.5)
WBC: 7.5 10*3/uL (ref 4.0–10.5)
nRBC: 0 % (ref 0.0–0.2)

## 2022-12-20 LAB — RAPID URINE DRUG SCREEN, HOSP PERFORMED
Amphetamines: NOT DETECTED
Barbiturates: NOT DETECTED
Benzodiazepines: NOT DETECTED
Cocaine: NOT DETECTED
Opiates: NOT DETECTED
Tetrahydrocannabinol: POSITIVE — AB

## 2022-12-20 LAB — IRON AND TIBC
Iron: 71 ug/dL (ref 28–170)
Saturation Ratios: 26 % (ref 10.4–31.8)
TIBC: 272 ug/dL (ref 250–450)
UIBC: 201 ug/dL

## 2022-12-20 LAB — FERRITIN: Ferritin: 87 ng/mL (ref 11–307)

## 2022-12-20 LAB — HIV ANTIBODY (ROUTINE TESTING W REFLEX): HIV Screen 4th Generation wRfx: NONREACTIVE

## 2022-12-20 LAB — SARS CORONAVIRUS 2 BY RT PCR: SARS Coronavirus 2 by RT PCR: NEGATIVE

## 2022-12-20 MED ORDER — LORATADINE 10 MG PO TABS
10.0000 mg | ORAL_TABLET | Freq: Every day | ORAL | Status: DC
  Administered 2022-12-21 – 2022-12-24 (×4): 10 mg via ORAL
  Filled 2022-12-20 (×5): qty 1

## 2022-12-20 MED ORDER — AMLODIPINE BESYLATE 10 MG PO TABS
10.0000 mg | ORAL_TABLET | Freq: Every day | ORAL | Status: DC
  Filled 2022-12-20: qty 1

## 2022-12-20 MED ORDER — SUMATRIPTAN 20 MG/ACT NA SOLN
20.0000 mg | NASAL | Status: DC | PRN
  Administered 2022-12-20: 20 mg via NASAL
  Filled 2022-12-20 (×2): qty 1

## 2022-12-20 MED ORDER — CLONIDINE HCL 0.2 MG/24HR TD PTWK
0.2000 mg | MEDICATED_PATCH | TRANSDERMAL | Status: DC
  Administered 2022-12-20: 0.2 mg via TRANSDERMAL
  Filled 2022-12-20: qty 1

## 2022-12-20 MED ORDER — HYDROCHLOROTHIAZIDE 25 MG PO TABS
25.0000 mg | ORAL_TABLET | Freq: Every day | ORAL | Status: DC
  Filled 2022-12-20: qty 1

## 2022-12-20 MED ORDER — IRBESARTAN 150 MG PO TABS
75.0000 mg | ORAL_TABLET | Freq: Every day | ORAL | Status: DC
  Filled 2022-12-20: qty 1

## 2022-12-20 MED ORDER — LACTATED RINGERS IV SOLN: INTRAVENOUS | Status: DC

## 2022-12-20 MED ORDER — VALACYCLOVIR HCL 500 MG PO TABS
1000.0000 mg | ORAL_TABLET | Freq: Every day | ORAL | Status: DC
  Filled 2022-12-20: qty 2

## 2022-12-20 NOTE — Consult Note (Addendum)
Consultation Note   Referring Provider:  [facility] PCP: Suzanne [name], No Pcp Per Primary Gastroenterologist: Wendall Papa, MD            Reason for consultation: Dysphagia  DOA: 12/19/2022         Hospital Day: 2   ASSESSMENT & PLAN   Suzanne [name] is a 47 y.o. year old female with a past medical history of H.pylori gastritis, chronic dysphagia/history of mild hypertensive lower esophageal sphincter , GERD, HTN, asthma, shingles, headaches, anxiety, depression, bacterial vaginosis, . Admitted 7/22 with COVID  Worsening of chronic dysphagia / history of mild hypertensive lower esophageal sphincter  / remote history of GE junction narrowing status post dilation in 2013 and 2014.   -No esophageal abnormalities on her last 2 upper endoscopies in 2016 and 2018 -Would probably benefit from repeat EGD with dilation.  If not helpful then may need repeat manometry as her last one was nearly 10 years ago -Soft diet okay  GERD, not well-controlled despite twice daily PPI -Continue twice daily PPI -Reviewed antireflux measures  COVID, asymptomatic now  Microcytosis, ? anemia. Hgb normal on admission at 12 but down to 11 after volume repletion.  -obtain iron studies  Colon cancer screening She is up-to-date on colonoscopy, last one in 2018  See PMH for additional medical history   Pertinent GI History:  Previously evaluated by [place] in office for N/V weight loss, GERD, chronic dysphagia and diarrhea.  Workup notable for H.pylori and a mild hypertensive lower esophageal sphincter    Attending Physician Note   I have taken a history, reviewed the chart and examined the Suzanne [name]. I performed a substantive portion of this encounter, including complete performance of at least one of the key components, in conjunction with the APP. I agree with the APP's note, impression and recommendations with my edits. My additional impressions and recommendations are as  follows.   Chronic dysphagia with history of an esophageal stricture, hypertensive LES and GERD.  Full liquid to soft diet as tolerated. Outpatient EGD with possible dilation after recovery from Covid-19. Consider repeat esophageal manometry and barium esophagram as outpatient pending EGD findings. Pantoprazole 40 mg bid now and at discharge. Carafate suspension qid now however would discontinue at discharge.   Covid-19 with diarrhea side effect from Paxlovid, leading to dehydration and orthostasis. Resolving cough.   Claudette Head, MD [facility] See [facility], [facility], for our on call provider     HISTORY OF PRESENT ILLNESS   Suzanne [name] admitted with COVID. Tested positive on Tues 7/16, started Paxlovid on 7/19,  came to ED on 7/22. Paxlovid caused her to have diarrhea. On admission she was volume depleted with orthostasis. Also had hypertensive urgency on admission   [name] has chronic dysphagia.  She has undergone several upper endoscopies with 2 or 3 dilations of a narrowed GE junction.  Manometry in 2014 showed that she had a mild hypertensive lower esophageal sphincter.  Her last EGD in February 2022 did not show any esophageal abnormalities, she was not empirically dilated..  She does feel like previous dilations have helped her chronic dysphagia but never resolved the problem.  Over the last few months she has developed worsening dysphagia to pills and solid food.  She sometimes "  chokes" on liquids.  Though she has lost several pounds since getting COVID, she has otherwise been able to maintain her weight by eating small bites very slowly.  Despite her efforts she still has frequent episodes of dysphagia.  She has to raise her arms and practice other maneuvers to try and get the lodged food to pass.  She often ends up regurgitating the food .  Additionally she has frequent heartburn despite twice daily Prilosec before meals.  She has distal chest pain/epigastric pain when food lodges in her  esophagus.  Otherwise no dysphagia.  She has not had any recent antibiotics.  She was treated a while back with metronidazole for bacterial vaginosis, no other recent antibiotics.  Diclofenac on her home med list but she says she is not taking it.   Admission workup notable for : --Normal WBC --Normal hemoglobin of 12, now down to 11 after IV fluids --MCV 75 --Normal c-Met,  --Normal CK -- Unremarkable UA  GI Evaluations:   July 2013 EGD for dysphagia -Benign smooth narrowing of the GE junction status post dilation  October 2014 EGD for dilation -Mild narrowing at the GE junction, status post balloon dilation  Dec 2014 esophageal manometry hypertensive LES Normal peristalsis  July 2016 EGD for dysphagia Normal  Colonoscopy 09/20/2016: The examined portion of the ileum was normal. - One 4 mm polyp in the sigmoid colon, removed with a cold snare. Resected and retrieved. - The examination was otherwise normal on direct and retroflexion views. - Biopsies were taken with a cold forceps from the entire colon for evaluation of microscopic Colitis.  1. Surgical [P], random sites - BENIGN COLORECTAL MUCOSA. - ASSOCIATED BENIGN LYMPHOID AGGREGATES. - NO EVIDENCE OF SIGNIFICANT INFLAMMATION, DYSPLASIA OR MALIGNANCY. 2. Surgical [P], sigmoid, polyp - BENIGN POLYPOID COLORECTAL MUCOSA WITH ASSOCIATED BENIGN LYMPHOID AGGREGATE(S) AND FOCAL GLANDULAR DILATATION. - NO DYSPLASIA OR MALIGNANCY IDENTIFIED. - SEE COMMENT.  EGD Feb 2022 for nausea / vomiting, heartburn and dysphagia -Gastritis Surgical [P], gastric antrum and gastric body - CHRONIC ACTIVE GASTRITIS WITH HELICOBACTER PYLORI. - WARTHIN-STARRY IS POSITIVE FOR HELICOBACTER PYLORI. - NO INTESTINAL METAPLASIA, DYSPLASIA, OR MALIGNANCY   Labs and Imaging: Recent Labs    12/19/22 1242 12/20/22 0712  WBC 5.4 7.5  HGB 12.0 11.0*  HCT 39.2 35.1*  PLT 220 182   Recent Labs    12/19/22 1242 12/20/22 0712  NA 137 136  K  3.7 3.7  CL 100 104  CO2 28 25  GLUCOSE 98 96  BUN 16 10  CREATININE 0.92 0.73  CALCIUM 9.0 8.9   Recent Labs    12/20/22 0712  PROT 6.8  ALBUMIN 3.5  AST 17  ALT 15  ALKPHOS 58  BILITOT 0.7   No results for input(s): "HEPBSAG", "HCVAB", "HEPAIGM", "HEPBIGM" in the last 72 hours. No results for input(s): "LABPROT", "INR" in the last 72 hours.    Past Medical History:  Diagnosis Date   Abnormal Pap smear of cervix    Allergy    Anemia    Anxiety    Arthritis    Asthma    Chest pain    Chronic lower back pain    Depression    Fibroid    GERD (gastroesophageal reflux disease)    Hypertension    Migraine    Migraines    Neck pain, chronic    Status post dilation of esophageal narrowing    STD (sexually transmitted disease)    HSV1, in eye  Past Surgical History:  Procedure Laterality Date   COLPOSCOPY     ESOPHAGEAL DILATION     ESOPHAGEAL MANOMETRY N/A 04/01/2013   Procedure: ESOPHAGEAL MANOMETRY (EM);  Surgeon: Rachael Fee, MD;  Location: [facility];  Service: Endoscopy;  Laterality: N/A;   ESOPHAGOGASTRODUODENOSCOPY (EGD) WITH ESOPHAGEAL DILATION N/A 02/28/2013   Procedure: ESOPHAGOGASTRODUODENOSCOPY (EGD) WITH ESOPHAGEAL DILATION;  Surgeon: Rachael Fee, MD;  Location: [facility];  Service: Endoscopy;  Laterality: N/A;   TUBAL LIGATION     VAGINAL HYSTERECTOMY      Family History  Problem Relation Age of Onset   Heart disease Father        H/O CABG   Hypertension Mother    Hypertension Maternal Grandmother    Thyroid disease Maternal Grandmother    Diabetes Maternal Grandmother    Hypertension Paternal Grandmother    Diabetes Paternal Grandmother    Cancer - Lung Paternal Grandmother    Hypertension Maternal Aunt    Thyroid disease Maternal Aunt    Esophageal cancer Other        age unknow   Colon cancer Neg Hx    Stomach cancer Neg Hx     Prior to Admission medications   Medication Sig Start Date End Date Taking? Authorizing  Provider  amLODipine (NORVASC) 10 MG tablet Take 1 tablet (10 mg total) by mouth daily. 07/31/19  Yes Jarold Motto, PA  estradiol (CLIMARA - DOSED IN MG/24 HR) 0.025 mg/24hr patch Place 0.025 mg onto the skin once a week.   Yes [provider]  nirmatrelvir & ritonavir (PAXLOVID) 20 x 150 MG & 10 x 100MG  TBPK Take by mouth. 12/16/22 12/21/22 Yes [provider]  ondansetron (ZOFRAN-ODT) 8 MG disintegrating tablet Take 8 mg by mouth every 8 (eight) hours as needed for vomiting or nausea. 12/16/22 12/23/22 Yes [provider]  valACYclovir (VALTREX) 1000 MG tablet Take 1,000 mg by mouth daily. 07/08/22  Yes [provider]  valsartan (DIOVAN) 160 MG tablet Take 160 mg by mouth daily.   Yes [provider]  Vitamin D, Ergocalciferol, (DRISDOL) 1.25 MG (50000 UNIT) CAPS capsule Take 50,000 Units by mouth every 7 (seven) days. 08/06/22  Yes [provider]  albuterol (PROVENTIL) (2.5 MG/3ML) 0.083% nebulizer solution Take 3 mLs (2.5 mg total) by nebulization every 4 (four) hours as needed for wheezing or shortness of breath. Suzanne [name] not taking: Reported on 12/19/2022 10/04/19   Hall-Potvin, [place], PA-C  albuterol (PROVENTIL) (2.5 MG/3ML) 0.083% nebulizer solution Take 3 mLs (2.5 mg total) by nebulization every 6 (six) hours as needed for wheezing or shortness of breath. Suzanne [name] not taking: Reported on 12/19/2022 02/10/22   Janell Quiet, PA-C  carbamazepine (TEGRETOL) 200 MG tablet Take 1 tablet (200 mg total) by mouth daily. Suzanne [name] not taking: Reported on 12/19/2022 10/22/19   Mare Ferrari, PA-C  cetirizine (ZYRTEC ALLERGY) 10 MG tablet Take 1 tablet (10 mg total) by mouth daily. Suzanne [name] not taking: Reported on 12/19/2022 10/04/19   Hall-Potvin, [place], PA-C  diclofenac (VOLTAREN) 50 MG EC tablet Take 1 tablet (50 mg total) by mouth 2 (two) times daily. Suzanne [name] not taking: Reported on 12/19/2022 11/04/19   Belinda Fisher, PA-C  fluconazole (DIFLUCAN) 150 MG  tablet Take 1 tablet (150 mg total) by mouth every 3 (three) days. Suzanne [name] not taking: Reported on 12/19/2022 09/17/22   Gustavus Bryant, FNP  hydrochlorothiazide (HYDRODIURIL) 25 MG tablet Take 1 tablet (25 mg total) by mouth daily. Suzanne [name] not taking: Reported  on 12/19/2022 07/31/19   Jarold Motto, PA  ketorolac (TORADOL) 10 MG tablet Take 1 tablet (10 mg total) by mouth every 8 (eight) hours as needed. Suzanne [name] not taking: Reported on 12/19/2022 09/15/21   Bing Neighbors, NP  metroNIDAZOLE (FLAGYL) 500 MG tablet Take 1 tablet (500 mg total) by mouth 2 (two) times daily. Suzanne [name] not taking: Reported on 12/19/2022 04/11/22   Ward, Tylene Fantasia, PA-C  Multiple Vitamins-Calcium (ONE-A-DAY WOMENS FORMULA PO) Take 1 tablet by mouth daily.    [provider]  nitrofurantoin, macrocrystal-monohydrate, (MACROBID) 100 MG capsule Take 1 capsule (100 mg total) by mouth 2 (two) times daily. Suzanne [name] not taking: Reported on 12/19/2022 09/17/22   Gustavus Bryant, FNP  omeprazole (PRILOSEC) 20 MG capsule Take 1 capsule (20 mg total) by mouth 2 (two) times daily before a meal for 10 days. 06/06/22 06/16/22  Blue, Soijett A, PA-C  ondansetron (ZOFRAN) 4 MG tablet Take 1 tablet (4 mg total) by mouth every 6 (six) hours as needed for nausea or vomiting. Suzanne [name] not taking: Reported on 12/19/2022 06/06/22   Blue, Soijett A, PA-C  promethazine (PHENERGAN) 25 MG tablet Take 1 tablet (25 mg total) by mouth 2 (two) times daily as needed for nausea or vomiting. Suzanne [name] not taking: Reported on 12/19/2022 09/28/20   Arnaldo Natal, NP  sucralfate (CARAFATE) 1 GM/10ML suspension Take 10 mLs (1 g total) by mouth 4 (four) times daily -  with meals and at bedtime. Suzanne [name] not taking: Reported on 12/19/2022 06/06/22   Blue, Soijett A, PA-C  fluticasone (FLONASE) 50 MCG/ACT nasal spray Place 1 spray into both nostrils daily. 10/04/19 11/04/19  Hall-Potvin, [place], PA-C    Current Facility-Administered Medications  Medication  Dose Route Frequency Provider Last Rate Last Admin   acetaminophen (TYLENOL) tablet 1,000 mg  1,000 mg Oral Q6H PRN Charlsie Quest, MD       Or   acetaminophen (TYLENOL) suppository 650 mg  650 mg Rectal Q6H PRN Charlsie Quest, MD       albuterol (PROVENTIL) (2.5 MG/3ML) 0.083% nebulizer solution 2.5 mg  2.5 mg Nebulization Q6H PRN Charlsie Quest, MD       enoxaparin (LOVENOX) injection 40 mg  40 mg Subcutaneous Q24H Patel, Vishal R, MD       hydrALAZINE (APRESOLINE) injection 10 mg  10 mg Intravenous Q2H PRN Charlsie Quest, MD       lactated ringers infusion   Intravenous Continuous Rhetta Mura, MD       morphine (PF) 2 MG/ML injection 2 mg  2 mg Intravenous Q3H PRN Darreld Mclean R, MD   2 mg at 12/19/22 2319   ondansetron (ZOFRAN) injection 4 mg  4 mg Intravenous Q6H PRN Darreld Mclean R, MD   4 mg at 12/20/22 0114   pantoprazole (PROTONIX) injection 40 mg  40 mg Intravenous Q12H Darreld Mclean R, MD   40 mg at 12/20/22 0132    Allergies as of 12/19/2022 - Review Complete 12/19/2022  Allergen Reaction Noted   Iodinated contrast media Rash and Anaphylaxis 11/14/2011   Reglan [metoclopramide] Shortness Of Breath 11/14/2011   Amoxicillin Rash 11/14/2011   Gabapentin Swelling 12/09/2019   Pregabalin Swelling 12/09/2019   Lisinopril Cough 07/02/2013   Losartan Cough 07/02/2013   Sulfa antibiotics Nausea And Vomiting 02/19/2014    Social History   Socioeconomic History   Marital status: Married    Spouse name: Kendrick   Number of children: 2   Years of education: Automotive engineer  Highest education level: Not on file  Occupational History   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    Comment: [employer]  Tobacco Use   Smoking status: Former    Current packs/day: 0.00    Average packs/day: 0.1 packs/day for 10.0 years (1.0 ttl pk-yrs)    Types: Cigars, Cigarettes    Start date: 12/07/2007    Quit date: 12/06/2017    Years since quitting: 5.0   Smokeless  tobacco: Never   Tobacco comments:    smokes 2 Black & Milds per day 08/19/13  Vaping Use   Vaping status: Never Used  Substance and Sexual Activity   Alcohol use: Not Currently   Drug use: Not Currently    Types: Marijuana   Sexual activity: Yes    Partners: Male    Birth control/protection: Surgical, None    Comment: hysterectomy  Other Topics Concern   Not on file  Social History Narrative   Suzanne [name] lives at home with family.   Suzanne [name] goes to [employer].   Caffeine Use 5-6 cups daily   She works as a Conservation officer, nature in [employer]   Suzanne [name] has 2 adopted children.   Social Determinants of Health   Financial Resource Strain: High Risk (12/16/2022)   Received from [facility]   Overall Financial Resource Strain (CARDIA)    Difficulty of Paying Living Expenses: Hard  Food Insecurity: No Food Insecurity (12/20/2022)   Hunger Vital Sign    Worried About Running Out of Food in the Last Year: Never true    Ran Out of Food in the Last Year: Never true  Recent Concern: Food Insecurity - Food Insecurity Present (12/16/2022)   Received from [facility]   Hunger Vital Sign    Worried About Running Out of Food in the Last Year: Sometimes true    Ran Out of Food in the Last Year: Sometimes true  Transportation Needs: No Transportation Needs (12/20/2022)   PRAPARE - Administrator, Civil Service (Medical): No    Lack of Transportation (Non-Medical): No  Physical Activity: Unknown (12/16/2022)   [facility]   Exercise Vital Sign    Days of Exercise per Week: 0 days    Minutes of Exercise per Session: Not on file  Stress: Stress Concern Present (12/16/2022)   Received from [facility] - Occupational Stress Questionnaire    Feeling of Stress : Very much  Social Connections: Somewhat Isolated (12/16/2022)   Received from [facility]   Social Network    How would you rate your social network (family, work, friends)?:  Restricted participation with some degree of social isolation  Intimate Partner Violence: Not At Risk (12/20/2022)   Humiliation, Afraid, Rape, and Kick questionnaire    Fear of Current or Ex-Partner: No    Emotionally Abused: No    Physically Abused: No    Sexually Abused: No     Code Status   Code Status: Full Code  Review of Systems: All systems reviewed and negative except where noted in HPI.  Physical Exam: Vital signs in last 24 hours: Temp:  [97.9 F (36.6 C)-98.4 F (36.9 C)] 97.9 F (36.6 C) (07/23 0913) Pulse Rate:  [55-88] 60 (07/23 0913) Resp:  [10-28] 18 (07/23 0913) BP: (133-219)/(72-126) 150/81 (07/23 0913) SpO2:  [95 %-100 %] 100 % (07/23 0913) Weight:  [77 kg] 77 kg (07/23 0035) Last BM Date : 12/19/22  General:  Pleasant female in NAD Psych:  Cooperative. Normal mood and affect Eyes: Pupils equal Ears:  Normal auditory acuity Nose: No deformity, discharge or lesions Mouth: no evidence for candida Neck:  Supple, no masses felt Lungs:  Clear to auscultation.  Heart:  Regular rate, regular rhythm.  Abdomen:  Soft, nondistended, nontender, active bowel sounds, no masses felt Rectal :  Deferred Msk: Symmetrical without gross deformities.  Neurologic:  Alert, oriented, grossly normal neurologically Extremities : No edema Skin:  Intact without significant lesions.    Intake/Output from previous day: No intake/output data recorded. Intake/Output this shift:  No intake/output data recorded.  Principal Problem:   Abdominal pain with vomiting Active Problems:   Essential hypertension   COVID-19 virus infection   Asthma    Willette Cluster, NP-C @  12/20/2022, 9:39 AM

## 2022-12-20 NOTE — Plan of Care (Signed)

## 2022-12-20 NOTE — Evaluation (Signed)
Physical Therapy Evaluation Patient Details Name: Suzanne [name] MRN: [id number] DOB: [birth year] Today's Date: 12/20/2022  History of Present Illness  47 yo female admitted with COVID?, abd pain, N/V. Hx of anemia, asthma, esophageal stricture, LBP, chronic pain, migraines, HSV, chronic dysphagia  Clinical Impression  On eval, pt required Min A for mobility. Arrived to observe patient's mother assisting her on/off bsc. With some encouragement, pt agreed to participate for PT eval. Limited mobility due to not feeling well. Pt reported 8/10 abd pain during session. Assisted pt back to bed end of session. Will plan to follow and progress activity as tolerated. At this time, recommendation is for HHPT f/u and RW (depending on continued progress).         Assistance Recommended at Discharge Intermittent Supervision/Assistance  If plan is discharge home, recommend the following:  Can travel by private vehicle  A little help with walking and/or transfers;A little help with bathing/dressing/bathroom;Assistance with cooking/housework;Assist for transportation;Help with stairs or ramp for entrance        Equipment Recommendations Rolling walker (2 wheels) (continuing to assess)  Recommendations for Other Services       Functional Status Assessment Patient has had a recent decline in their functional status and demonstrates the ability to make significant improvements in function in a reasonable and predictable amount of time.     Precautions / Restrictions Precautions Precautions: Fall Precaution Comments: dizziness Restrictions Weight Bearing Restrictions: No      Mobility  Bed Mobility Overal bed mobility: Needs Assistance Bed Mobility: Supine to Sit, Sit to Supine     Supine to sit: Supervision, HOB elevated Sit to supine: Supervision, HOB elevated   General bed mobility comments: Supv for safety, lines. Increased time. Reliance on bedrail.    Transfers Overall transfer  level: Needs assistance Equipment used: Rolling walker (2 wheels) Transfers: Sit to/from Stand Sit to Stand: Min guard, From elevated surface           General transfer comment: Min guard for safety. Increased time. Cues for safety.    Ambulation/Gait Ambulation/Gait assistance: Min guard Gait Distance (Feet): 3 Feet Assistive device: Rolling walker (2 wheels)         General Gait Details: Utilized RW. Pt took a few side steps first. Then she took 3 steps forwards, 3 steps backwards. Deferred any further ambulation at this time due pt not feeling well/up for it. Increased time.   Stairs            Wheelchair Mobility     Tilt Bed    Modified Rankin (Stroke Patients Only)       Balance Overall balance assessment: Needs assistance         Standing balance support: Bilateral upper extremity supported, Reliant on assistive device for balance, During functional activity Standing balance-Leahy Scale: Poor                               Pertinent Vitals/Pain Pain Assessment Pain Assessment: 0-10 Pain Score: 8  Pain Location: abdomen Pain Descriptors / Indicators: Aching Pain Intervention(s): Limited activity within patient's tolerance, Monitored during session, Premedicated before session, Repositioned    Home Living Family/patient expects to be discharged to:: Private residence Living Arrangements: Children Available Help at Discharge: Family Type of Home: House Home Access: Stairs to enter     Alternate Level Stairs-Number of Steps: 1 flight Home Layout: Two level Home Equipment: None  Prior Function Prior Level of Function : Independent/Modified Independent                     Hand Dominance        Extremity/Trunk Assessment   Upper Extremity Assessment Upper Extremity Assessment: Overall WFL for tasks assessed    Lower Extremity Assessment Lower Extremity Assessment: Generalized weakness    Cervical / Trunk  Assessment Cervical / Trunk Assessment: Normal  Communication   Communication: No difficulties  Cognition Arousal/Alertness: Awake/alert Behavior During Therapy: Flat affect, WFL for tasks assessed/performed Overall Cognitive Status: Within Functional Limits for tasks assessed                                          General Comments      Exercises     Assessment/Plan    PT Assessment Patient needs continued PT services  PT Problem List Decreased strength;Decreased mobility;Decreased activity tolerance;Decreased balance;Decreased knowledge of use of DME;Pain       PT Treatment Interventions DME instruction;Therapeutic activities;Gait training;Therapeutic exercise;Functional mobility training;Balance training;Patient/family education    PT Goals (Current goals can be found in the Care Plan section)  Acute Rehab PT Goals Patient Stated Goal: less pain PT Goal Formulation: With patient Time For Goal Achievement: 01/03/23 Potential to Achieve Goals: Good    Frequency Min 1X/week     Co-evaluation               AM-PAC PT "6 Clicks" Mobility  Outcome Measure Help needed turning from your back to your side while in a flat bed without using bedrails?: A Little Help needed moving from lying on your back to sitting on the side of a flat bed without using bedrails?: A Little Help needed moving to and from a bed to a chair (including a wheelchair)?: A Little Help needed standing up from a chair using your arms (e.g., wheelchair or bedside chair)?: A Little Help needed to walk in hospital room?: A Little Help needed climbing 3-5 steps with a railing? : A Lot 6 Click Score: 17    End of Session   Activity Tolerance: Patient limited by fatigue;Patient limited by pain Patient left: in bed;with call bell/phone within reach;with family/visitor present   PT Visit Diagnosis: Muscle weakness (generalized) (M62.81);Difficulty in walking, not elsewhere classified  (R26.2);Pain Pain - part of body:  (abdomen)    Time: 8295-6213 PT Time Calculation (min) (ACUTE ONLY): 17 min   Charges:   PT Evaluation $PT Eval Low Complexity: 1 Low   PT General Charges $$ ACUTE PT VISIT: 1 Visit           Faye Ramsay, PT Acute Rehabilitation  Office: [phone]

## 2022-12-20 NOTE — Progress Notes (Addendum)
PO medications taken to patient. Pt refused, stating that she is feeling full and experiencing some pain/discomfort. She reports intermittent nausea and denies emesis.Pt reports being unable to take any PO medication at this time. Additionally, pt c/o pain Left upper arm near [facility]. The area is erythematous, edematous, non pitting and painful on palpation. She denies IV access or sticks in the area of concern. Hospitalist updated. See new orders.

## 2022-12-20 NOTE — Progress Notes (Signed)
Transition of Care [facility]) - Inpatient Brief Assessment   Patient Details  Name: Suzanne [name] MRN: [id number] Date of Birth: Feb 18, 1976  Transition of Care [facility]) CM/SW Contact:    Coralyn Helling, LCSW Phone Number: 12/20/2022, 11:22 AM   Clinical Narrative: Patient denies food insecurities. Reports recent issue due to having COVID. Patient reports having a PCP at [facility]. No issues with transportation or obtaining meds. Patient denies any needs at time of screening.    Transition of Care Asessment: Insurance and Status: Insurance coverage has been reviewed Patient has primary care physician: Yes ([facility] per patient) Home environment has been reviewed: From home with Children Prior level of function:: independent Prior/Current Home Services: No current home services Social Determinants of Health Reivew: SDOH reviewed no interventions necessary Readmission risk has been reviewed: (P) Yes Transition of care needs: (P) no transition of care needs at this time

## 2022-12-20 NOTE — Progress Notes (Signed)
BP follow post Hydralazine. Patient is requesting home medications to be restarted to assist with BP management. Hospitalist contacted. See new orders.  12/20/22 1444  Vitals  BP (!) 170/93  MAP (mmHg) 115  BP Location Left Arm  BP Method Automatic  Patient Position (if appropriate) Lying  Pulse Rate 78  MEWS COLOR  MEWS Score Color Green  MEWS Score  MEWS Temp 0  MEWS Systolic 0  MEWS Pulse 0  MEWS RR 0  MEWS LOC 0  MEWS Score 0

## 2022-12-20 NOTE — Telephone Encounter (Signed)
-----   Message from Willette Cluster sent at 12/20/2022  1:34 PM EDT ----- Waynetta Sandy, this patient is in the hospital with COVID.  She needs an EGD for evaluation and treatment of dysphagia but we will most likely wait until she is out of the hospital and over COVID.  Could you get her an outpatient EGD appointment with Dr. Russella Dar.  Previously Dr. Christella Hartigan patient but Dr. Russella Dar will assume care now.  No need to call her for couple days with the appointment and information as she is currently hospitalized.  Thanks

## 2022-12-20 NOTE — Progress Notes (Addendum)
HOSPITALIST ROUNDING NOTE ARDATH LEPAK ZOX:[id number]  DOB: 06-01-1975  DOA: 12/19/2022  PCP: Patient, No Pcp Per  12/20/2022,7:30 AM   LOS: 0 days      Code Status: Full   From: Home  current Dispo: Likely home     47 year old black prior history of bacterial vaginosis status post treatment Cannabis use associated with hyperemesis HTN IDA Reflux--- has had scope in 2014/16 suggestive of dysmotility and had dilatation at the time by Dr. Christella Hartigan and repeat scope 2022 had gastritis Developed cough 12/12/2022 tested +7/16 for COVID-intermittent but continued fevers until 7/19 started Paxlovid on that date-developed epigastric pain, loose stools, shortness of breath as well lightheadedness Came to emergency room Rx Toradol GI cocktail Compazine Protonix IV fluid Was orthostatic unable to stand for 3 minutes CK 54 troponin negative, Other lab work predominantly negative   admitted primarily for abdominal pain nausea vomiting in setting of COVID UDS 4 marijuana use is positive for cannabis   Plan  Nausea vomiting?  Hyperemesis from cannabis?  Gastritis + esophageal stenosis GI requested to kindly see-may benefit from endoscopy and esophageal dilatation if we can accomplish this Right now can use full liquid diet until we can coordinate endoscopy etc. Her UDS is positive for cannabis although she says she has not used in over 2 months? Looks like she is on Voltaren tablets which probably should not be used in the near future if this is gastritis and we have held it Reinforce home Prilosec on discharge Continue Protonix 40 twice daily, Zofran 4 every 4 as needed--- depending on scope may need to add back Carafate that she has used in the past  Asymptomatic COVID diagnosed on 7/15 COVID testing surprisingly is negative here-deferred to infection prevention-would keep precautions until they tell us we can come off of it as she is relatively asymptomatic Given she is asymptomatic I have held the  Paxlovid  Volume depletion leading to orthostasis Recheck orthostatics Cut back fluids to 50 cc/H Diet to be graduated as per GI eventually  Hypertensive urgency on admission Patient is not taking PTA HCTZ 25 valsartan 160 amlodipine 10 I will resume amlodipine now hold HCTZ/valsartan  Seizure?  Other Is on Tegretol?  Has not taken this previously-removed from med list  On estradiol patch for contraception Note absorption of this is typically erratic-defer to PCP/GYN follow-up   Discussed with detail with mother at the bedside this morning  DVT prophylaxis: Lovenox  Status is: Observation The patient will require care spanning > 2 midnights and should be moved to inpatient because:   Might need to go to Endo for endoscopy    Subjective: Weak some mild chest discomfort Tells me that she had a scope previously with dilatation and feels about the same for the past 2 weeks with discomfort in her chest Also feels like food gets "stuck" in all areas of her upper chest she is only been able to keep down liquids over the past 1 to 2 weeks We had a discussion about cannabis and not using it because of hyperemesis  Objective + exam Vitals:   12/19/22 2345 12/20/22 0030 12/20/22 0035 12/20/22 0426  BP: (!) 192/94 (!) 179/111  133/88  Pulse:  77  64  Resp: (!) 28 17  17   Temp:  98.3 F (36.8 C)  98.4 F (36.9 C)  TempSrc:  Oral  Oral  SpO2:  95%  99%  Weight:   77 kg   Height:   5' 4.17" (1.63  m)    Filed Weights   12/20/22 0035  Weight: 77 kg    Examination:  EOMI NCAT no focal deficit no icterus no pallor looks younger than stated age nose ring Chest is clear no wheeze rales rhonchi Abdomen slightly tender in epigastrium cannot appreciate HSM She has some left breast tenderness (mother in room as chaperone when I examined close parent No lower extremity edema ROM intact  Data Reviewed: reviewed   CBC    Component Value Date/Time   WBC 5.4 12/19/2022 1242   RBC  5.22 (H) 12/19/2022 1242   HGB 12.0 12/19/2022 1242   HGB 11.3 (L) 11/18/2019 0908   HCT 39.2 12/19/2022 1242   PLT 220 12/19/2022 1242   PLT 307 11/18/2019 0908   MCV 75.1 (L) 12/19/2022 1242   MCH 23.0 (L) 12/19/2022 1242   MCHC 30.6 12/19/2022 1242   RDW 15.1 12/19/2022 1242   LYMPHSABS 2.1 12/19/2022 1242   MONOABS 0.6 12/19/2022 1242   EOSABS 0.2 12/19/2022 1242   BASOSABS 0.0 12/19/2022 1242      Latest Ref Rng & Units 12/19/2022   12:42 PM 06/05/2022   10:40 PM 02/10/2022    9:20 AM  CMP  Glucose 70 - 99 mg/dL 98  062  81   BUN 6 - 20 mg/dL 16  24  10    Creatinine 0.44 - 1.00 mg/dL 3.76  2.83  1.51   Sodium 135 - 145 mmol/L 137  139  141   Potassium 3.5 - 5.1 mmol/L 3.7  3.9  3.7   Chloride 98 - 111 mmol/L 100  103  107   CO2 22 - 32 mmol/L 28  26  29    Calcium 8.9 - 10.3 mg/dL 9.0  9.5  9.1   Total Protein 6.5 - 8.1 g/dL 7.9  8.7    Total Bilirubin 0.3 - 1.2 mg/dL 0.6  0.5    Alkaline Phos 38 - 126 U/L 68  77    AST 15 - 41 U/L 17  21    ALT 0 - 44 U/L 15  19       Scheduled Meds:  enoxaparin (LOVENOX) injection  40 mg Subcutaneous Q24H   pantoprazole (PROTONIX) IV  40 mg Intravenous Q12H   Continuous Infusions:  lactated ringers 125 mL/hr at 12/20/22 7616    Time 33  Rhetta Mura, MD  [facility]

## 2022-12-21 ENCOUNTER — Inpatient Hospital Stay (HOSPITAL_COMMUNITY): Payer: Managed Care, Other (non HMO)

## 2022-12-21 DIAGNOSIS — R109 Unspecified abdominal pain: Secondary | ICD-10-CM | POA: Diagnosis not present

## 2022-12-21 DIAGNOSIS — R1110 Vomiting, unspecified: Secondary | ICD-10-CM | POA: Diagnosis not present

## 2022-12-21 DIAGNOSIS — K219 Gastro-esophageal reflux disease without esophagitis: Secondary | ICD-10-CM | POA: Diagnosis not present

## 2022-12-21 DIAGNOSIS — R1319 Other dysphagia: Secondary | ICD-10-CM | POA: Diagnosis not present

## 2022-12-21 DIAGNOSIS — K224 Dyskinesia of esophagus: Secondary | ICD-10-CM | POA: Diagnosis not present

## 2022-12-21 LAB — COMPREHENSIVE METABOLIC PANEL
ALT: 12 U/L (ref 0–44)
AST: 14 U/L — ABNORMAL LOW (ref 15–41)
Albumin: 3.4 g/dL — ABNORMAL LOW (ref 3.5–5.0)
Alkaline Phosphatase: 54 U/L (ref 38–126)
Anion gap: 11 (ref 5–15)
BUN: 15 mg/dL (ref 6–20)
CO2: 24 mmol/L (ref 22–32)
Calcium: 8.8 mg/dL — ABNORMAL LOW (ref 8.9–10.3)
Chloride: 102 mmol/L (ref 98–111)
Creatinine, Ser: 0.78 mg/dL (ref 0.44–1.00)
GFR, Estimated: >60 mL/min (ref >60)
Glucose, Bld: 83 mg/dL (ref 70–99)
Potassium: 3.4 mmol/L — ABNORMAL LOW (ref 3.5–5.1)
Sodium: 137 mmol/L (ref 135–145)
Total Bilirubin: 0.5 mg/dL (ref 0.3–1.2)
Total Protein: 6.9 g/dL (ref 6.5–8.1)

## 2022-12-21 LAB — GLUCOSE, CAPILLARY: Glucose-Capillary: 94 mg/dL (ref 70–99)

## 2022-12-21 MED ORDER — HYDRALAZINE HCL 20 MG/ML IJ SOLN
10.0000 mg | INTRAMUSCULAR | Status: DC | PRN
  Filled 2022-12-21 (×2): qty 1

## 2022-12-21 MED ORDER — PROCHLORPERAZINE EDISYLATE 10 MG/2ML IJ SOLN
10.0000 mg | Freq: Four times a day (QID) | INTRAMUSCULAR | Status: DC | PRN
  Administered 2022-12-23: 10 mg via INTRAVENOUS
  Filled 2022-12-21: qty 2

## 2022-12-21 MED ORDER — POTASSIUM CHLORIDE 20 MEQ PO PACK
40.0000 meq | PACK | Freq: Once | ORAL | Status: AC
  Administered 2022-12-21: 40 meq via ORAL
  Filled 2022-12-21: qty 2

## 2022-12-21 MED ORDER — AMLODIPINE BESYLATE 5 MG PO TABS
5.0000 mg | ORAL_TABLET | Freq: Every day | ORAL | Status: DC
  Administered 2022-12-21: 5 mg via ORAL
  Filled 2022-12-21: qty 1

## 2022-12-21 MED ORDER — DIPHENHYDRAMINE HCL 50 MG/ML IJ SOLN
25.0000 mg | Freq: Once | INTRAMUSCULAR | Status: AC
  Administered 2022-12-21: 25 mg via INTRAVENOUS
  Filled 2022-12-21: qty 1

## 2022-12-21 MED ORDER — PROCHLORPERAZINE EDISYLATE 10 MG/2ML IJ SOLN
5.0000 mg | Freq: Once | INTRAMUSCULAR | Status: AC
  Administered 2022-12-21: 5 mg via INTRAVENOUS
  Filled 2022-12-21: qty 2

## 2022-12-21 MED ORDER — POTASSIUM CHLORIDE 10 MEQ/100ML IV SOLN
10.0000 meq | INTRAVENOUS | Status: AC
  Filled 2022-12-21 (×3): qty 100

## 2022-12-21 MED ORDER — PANTOPRAZOLE SODIUM 40 MG IV SOLR
40.0000 mg | Freq: Two times a day (BID) | INTRAVENOUS | Status: DC
  Administered 2022-12-21 – 2022-12-24 (×7): 40 mg via INTRAVENOUS
  Filled 2022-12-21 (×7): qty 10

## 2022-12-21 MED ORDER — PROCHLORPERAZINE EDISYLATE 10 MG/2ML IJ SOLN
10.0000 mg | Freq: Once | INTRAMUSCULAR | Status: AC
  Administered 2022-12-21: 10 mg via INTRAVENOUS
  Filled 2022-12-21: qty 2

## 2022-12-21 MED ORDER — ACETAMINOPHEN 10 MG/ML IV SOLN
1000.0000 mg | Freq: Once | INTRAVENOUS | Status: AC
  Administered 2022-12-21 – 2022-12-22 (×2): 1000 mg via INTRAVENOUS
  Filled 2022-12-21: qty 100

## 2022-12-21 MED ORDER — ACETAMINOPHEN 10 MG/ML IV SOLN
1000.0000 mg | Freq: Once | INTRAVENOUS | Status: AC
  Administered 2022-12-21: 1000 mg via INTRAVENOUS
  Filled 2022-12-21: qty 100

## 2022-12-21 NOTE — Progress Notes (Signed)
PROGRESS NOTE    MARTY SADLOWSKI  GNF:[id number] DOB: 11-25-1975 DOA: 12/19/2022 PCP: Patient, No Pcp Per   Brief Narrative: 47 year old with past medical history significant for cannabis use associated hyperemesis, hypertension, IDA, reflux, previous endoscopy 2014/2016 suggestive of dysmotility and had dilation at that time by Dr. Christella Hartigan, repeat scope 2022 had gastritis, presents for evaluation of generalized weakness and nausea.  She initially had symptoms of fever, hot flashes, nausea and cough she tested positive for COVID on 7/16.  She was started on a course of Paxlovid on 7/19.  She has been feeling progressively weak and fatigue.  She has been having persistent epigastric pain radiates to her back.  She reports loose stool.  Evaluation in the ED chest x-ray was negative for consolidation of edema,    Assessment & Plan:   Principal Problem:   Abdominal pain with vomiting Active Problems:   Essential hypertension   COVID-19 virus infection   Asthma  1-Abdominal pain, nausea vomiting Dysphagia:  GI following.  She vomited last night.  Report epigastric pain. Started to eat some, very minimal.  On IV PPI BID>  Will check RUQ US>  LFT and lipase Nl.   COVID-19 viral infection: Denies respiratory symptoms.  Continue with support care.  Headaches: suspect related to covid, but will proceed with MRI brain.  Will give IV benadryl and IV compazine and IV tylenol./   Generalized weakness: Related to covid.  Out of bed.   Hypertension: Holding cozaar and hydrochlorothiazide, due to nausea and vomiting.  Started on Clonidine Patch.  Resume Norvasc.   Asthma: PRN nebulizer.          Estimated body mass index is 28.98 kg/m as calculated from the following:   Height as of this encounter: 5' 4.17" (1.63 m).   Weight as of this encounter: 77 kg.   DVT prophylaxis: Lovenox Code Status: Full code Family Communication: Care discussed with patient Disposition Plan:   Status is: Inpatient Remains inpatient appropriate because: management of covid symptoms, poor oral intake.     Consultants:  GI  Procedures:  [place]  Antimicrobials:    Subjective: She is still having epigastric abdominal pain report nausea. Still with poor oral intake, not eating much.  She is still having occipital and frontal headaches,  neck was supple, no focal deficit.   Objective: Vitals:   12/21/22 0149 12/21/22 0259 12/21/22 0528 12/21/22 0544  BP: (!) 184/97 (!) 146/71 132/83   Pulse: 77 81 (!) 53 75  Resp:  16 17   Temp:   98.4 F (36.9 C)   TempSrc:   Oral   SpO2:  100% (!) 81% 100%  Weight:      Height:        Intake/Output Summary (Last 24 hours) at 12/21/2022 0833 Last data filed at 12/20/2022 1536 Gross per 24 hour  Intake 280 ml  Output --  Net 280 ml   Filed Weights   12/20/22 0035  Weight: 77 kg    Examination:  General exam: Appears calm and comfortable  Respiratory system: Clear to auscultation. Respiratory effort normal. Cardiovascular system: S1 & S2 heard, RRR. No JVD, murmurs, rubs, gallops or clicks. No pedal edema. Gastrointestinal system: Abdomen is nondistended, soft and nontender. No organomegaly or masses felt. Normal bowel sounds heard. Central nervous system: Alert and oriented. No focal neurological deficits. Extremities: Symmetric 5 x 5 power.    Data Reviewed: I have personally reviewed following labs and imaging studies  CBC: Recent Labs  Lab 12/19/22 1242 12/20/22 0712  WBC 5.4 7.5  NEUTROABS 2.4  --   HGB 12.0 11.0*  HCT 39.2 35.1*  MCV 75.1* 73.4*  PLT 220 182   Basic Metabolic Panel: Recent Labs  Lab 12/19/22 1242 12/20/22 0712  NA 137 136  K 3.7 3.7  CL 100 104  CO2 28 25  GLUCOSE 98 96  BUN 16 10  CREATININE 0.92 0.73  CALCIUM 9.0 8.9   GFR: Estimated Creatinine Clearance: 87.7 mL/min (by C-G formula based on SCr of 0.73 mg/dL). Liver Function Tests: Recent Labs  Lab 12/19/22 1242  12/20/22 0712  AST 17 17  ALT 15 15  ALKPHOS 68 58  BILITOT 0.6 0.7  PROT 7.9 6.8  ALBUMIN 3.9 3.5   Recent Labs  Lab 12/19/22 1322  LIPASE 34   No results for input(s): "AMMONIA" in the last 168 hours. Coagulation Profile: No results for input(s): "INR", "PROTIME" in the last 168 hours. Cardiac Enzymes: Recent Labs  Lab 12/19/22 2058  CKTOTAL 54   BNP (last 3 results) No results for input(s): "PROBNP" in the last 8760 hours. HbA1C: No results for input(s): "HGBA1C" in the last 72 hours. CBG: No results for input(s): "GLUCAP" in the last 168 hours. Lipid Profile: No results for input(s): "CHOL", "HDL", "LDLCALC", "TRIG", "CHOLHDL", "LDLDIRECT" in the last 72 hours. Thyroid Function Tests: No results for input(s): "TSH", "T4TOTAL", "FREET4", "T3FREE", "THYROIDAB" in the last 72 hours. Anemia Panel: Recent Labs    12/20/22 0711  FERRITIN 87  TIBC 272  IRON 71   Sepsis Labs: No results for input(s): "PROCALCITON", "LATICACIDVEN" in the last 168 hours.  Recent Results (from the past 240 hour(s))  SARS Coronavirus 2 by RT PCR (hospital order, performed in [facility] hospital lab) *cepheid single result test* Anterior Nasal Swab     Status: None   Collection Time: 12/20/22  1:19 AM   Specimen: Anterior Nasal Swab  Result Value Ref Range Status   SARS Coronavirus 2 by RT PCR NEGATIVE NEGATIVE Final    Comment: (NOTE) SARS-CoV-2 target nucleic acids are NOT DETECTED.  The SARS-CoV-2 RNA is generally detectable in upper and lower respiratory specimens during the acute phase of infection. The lowest concentration of SARS-CoV-2 viral copies this assay can detect is 250 copies / mL. A negative result does not preclude SARS-CoV-2 infection and should not be used as the sole basis for treatment or other patient management decisions.  A negative result may occur with improper specimen collection / handling, submission of specimen other than nasopharyngeal swab, presence  of viral mutation(s) within the areas targeted by this assay, and inadequate number of viral copies (<250 copies / mL). A negative result must be combined with clinical observations, patient history, and epidemiological information.  Fact Sheet for Patients:   RoadLapTop.co.za  Fact Sheet for Healthcare Providers: http://kim-miller.com/  This test is not yet approved or  cleared by the [place] FDA and has been authorized for detection and/or diagnosis of SARS-CoV-2 by FDA under an Emergency Use Authorization (EUA).  This EUA will remain in effect (meaning this test can be used) for the duration of the COVID-19 declaration under Section 564(b)(1) of the Act, 21 U.S.C. section 360bbb-3(b)(1), unless the authorization is terminated or revoked sooner.  Performed at [facility], [address] [place], [place] [address]          Radiology Studies: DG Chest 2 View  Result Date: 12/19/2022 CLINICAL DATA:  CP/SOB EXAM: CHEST -  2 VIEW COMPARISON:  CXR 02/10/22 FINDINGS: No pleural effusion. No pneumothorax. Normal cardiac and mediastinal contours. No focal airspace opacity. No radiographically apparent displaced rib fractures. Visualized upper abdomen is unremarkable. Vertebral body heights are maintained. IMPRESSION: No focal airspace opacity Electronically Signed   By: Lorenza Cambridge M.D.   On: 12/19/2022 13:05        Scheduled Meds:  cloNIDine  0.2 mg Transdermal Weekly   enoxaparin (LOVENOX) injection  40 mg Subcutaneous Q24H   loratadine  10 mg Oral Daily   pantoprazole (PROTONIX) IV  40 mg Intravenous Q12H   Continuous Infusions:  lactated ringers 50 mL/hr at 12/21/22 0145     LOS: 1 day    Time spent: 35 minutes    Vonna Brabson A Radiah Lubinski, MD [facility]   If 7PM-7AM, please contact night-coverage [email or web address]  12/21/2022, 8:33 AM

## 2022-12-21 NOTE — Progress Notes (Signed)
Potassium chloride IV initiated at 75 cc/hr. Pt complained of burning at IV site. Rate decreased to 50 cc/hr. Pt states she is unable to tolerate at this time. IV assessed no erythema, or edema. IV is patent with blood return. Hospitalist provider contacted to assist. Order entered for PO potassium. If pt is able to tolerate may discontinue IV potassium.

## 2022-12-21 NOTE — Discharge Instructions (Signed)
[facility] will contact you with a date / time of upper endoscopy to evaluate swallowing problems.  If you have not heard from them within a few days after your hospital discharge then please call their office at [phone] and speak with Arlyss Queen, LPN

## 2022-12-21 NOTE — Progress Notes (Addendum)
Daily Progress Note  DOA: 12/19/2022 Hospital Day: 3 Chief Complaint: COVID, dysphagia   Assessment and Plan:    Brief Narrative:  Suzanne [name] is a 47 y.o. year old female with a history of  H.pylori gastritis, chronic dysphagia/history of mild hypertensive lower esophageal sphincter , GERD, HTN, asthma, shingles, headaches, anxiety, depression, bacterial vaginosis. Admitted 7/22 with COVID. We were asked to see for dysphagia  Worsening of chronic dysphagia / history of mild hypertensive lower esophageal sphincter  / remote history of GE junction narrowing status post dilation in 2013 and 2014.   -No esophageal abnormalities on her last 2 upper endoscopies in 2016 and 2018 -Would probably benefit from repeat EGD with dilation as outpatient after resolution of COVID. Our office will be contacting patient with date for EGD.   -If esophageal dilation not helpful consider repeat manometry and barium swallow  GERD, not well-controlled despite twice daily PPI -Continue twice daily PPI -Reviewed antireflux measures   COVID, asymptomatic now   Microcytosis, ? anemia. Hgb normal on admission at 12 but down to 11 after volume repletion.  -No evidence for iron deficiency on iron studies   Colon cancer screening She is up-to-date on colonoscopy, last one in 2018   See PMH for additional medical history   Attending Physician Note   I have taken an interval history, reviewed the chart and examined the patient. I performed a substantive portion of this encounter, including complete performance of at least one of the key components, in conjunction with the APP. I agree with the APP's note, impression and recommendations.  GI signing off. We will arrange an outpatient EGD to be scheduled in the near future.   Claudette Head, MD [facility] See AMION, [facility], for our on call provider     Subjective / New Events:   Vomited after dose of Imitrex last evening.  This am tolerated a  fruit cup with swallowing issues.  Other than headache she has no COVID symptoms   Objective:   Recent Labs    12/19/22 1242 12/20/22 0712  WBC 5.4 7.5  HGB 12.0 11.0*  HCT 39.2 35.1*  PLT 220 182   BMET Recent Labs    12/19/22 1242 12/20/22 0712  NA 137 136  K 3.7 3.7  CL 100 104  CO2 28 25  GLUCOSE 98 96  BUN 16 10  CREATININE 0.92 0.73  CALCIUM 9.0 8.9   LFT Recent Labs    12/20/22 0712  PROT 6.8  ALBUMIN 3.5  AST 17  ALT 15  ALKPHOS 58  BILITOT 0.7   PT/INR No results for input(s): "LABPROT", "INR" in the last 72 hours.   Imaging:  DG Chest 2 View CLINICAL DATA:  CP/SOB  EXAM: CHEST - 2 VIEW  COMPARISON:  CXR 02/10/22  FINDINGS: No pleural effusion. No pneumothorax. Normal cardiac and mediastinal contours. No focal airspace opacity. No radiographically apparent displaced rib fractures. Visualized upper abdomen is unremarkable. Vertebral body heights are maintained.  IMPRESSION: No focal airspace opacity  Electronically Signed   By: Lorenza Cambridge M.D.   On: 12/19/2022 13:05     Scheduled inpatient medications:   cloNIDine  0.2 mg Transdermal Weekly   enoxaparin (LOVENOX) injection  40 mg Subcutaneous Q24H   loratadine  10 mg Oral Daily   pantoprazole (PROTONIX) IV  40 mg Intravenous Q12H   Continuous inpatient infusions:   lactated ringers 50 mL/hr at 12/21/22 0145   PRN inpatient medications: acetaminophen **OR**  acetaminophen, albuterol, hydrALAZINE, morphine injection, ondansetron (ZOFRAN) IV, SUMAtriptan  Vital signs in last 24 hours: Temp:  [98.4 F (36.9 C)-99 F (37.2 C)] 99 F (37.2 C) (07/24 1049) Pulse Rate:  [53-86] 78 (07/24 1049) Resp:  [16-20] 18 (07/24 1049) BP: (132-184)/(71-102) 159/88 (07/24 1049) SpO2:  [81 %-100 %] 100 % (07/24 1049) Last BM Date : 12/19/22  Intake/Output Summary (Last 24 hours) at 12/21/2022 1054 Last data filed at 12/20/2022 1536 Gross per 24 hour  Intake 280 ml  Output --  Net 280 ml     Intake/Output from previous day: 07/23 0701 - 07/24 0700 In: 280 [I.V.:280] Out: -  Intake/Output this shift: No intake/output data recorded.   Physical Exam:  General: Alert female in NAD Heart:  Regular rate and rhythm.  Pulmonary: Normal respiratory effort Abdomen: Soft, nondistended, nontender. Normal bowel sounds. Extremities: No lower extremity edema  Neurologic: Alert and oriented Psych: Pleasant. Cooperative. Insight appears normal.    Principal Problem:   Abdominal pain with vomiting Active Problems:   Essential hypertension   COVID-19 virus infection   Asthma     LOS: 1 day   Willette Cluster ,NP 12/21/2022, 10:54 AM

## 2022-12-22 DIAGNOSIS — R109 Unspecified abdominal pain: Secondary | ICD-10-CM | POA: Diagnosis not present

## 2022-12-22 DIAGNOSIS — R1110 Vomiting, unspecified: Secondary | ICD-10-CM | POA: Diagnosis not present

## 2022-12-22 LAB — BASIC METABOLIC PANEL
Anion gap: 9 (ref 5–15)
BUN: 20 mg/dL (ref 6–20)
CO2: 24 mmol/L (ref 22–32)
Calcium: 8.5 mg/dL — ABNORMAL LOW (ref 8.9–10.3)
Chloride: 104 mmol/L (ref 98–111)
Creatinine, Ser: 0.93 mg/dL (ref 0.44–1.00)
GFR, Estimated: >60 mL/min (ref >60)
Glucose, Bld: 98 mg/dL (ref 70–99)
Potassium: 3.6 mmol/L (ref 3.5–5.1)
Sodium: 137 mmol/L (ref 135–145)

## 2022-12-22 MED ORDER — RIZATRIPTAN BENZOATE 10 MG PO TABS
10.0000 mg | ORAL_TABLET | ORAL | Status: DC | PRN
  Administered 2022-12-23: 10 mg via ORAL

## 2022-12-22 MED ORDER — SUCRALFATE 1 GM/10ML PO SUSP
1.0000 g | Freq: Three times a day (TID) | ORAL | Status: DC
  Administered 2022-12-22 – 2022-12-24 (×8): 1 g via ORAL
  Filled 2022-12-22 (×10): qty 10

## 2022-12-22 MED ORDER — BISACODYL 10 MG RE SUPP
10.0000 mg | Freq: Every day | RECTAL | Status: DC | PRN
  Administered 2022-12-23: 10 mg via RECTAL
  Filled 2022-12-22: qty 1

## 2022-12-22 MED ORDER — SENNOSIDES-DOCUSATE SODIUM 8.6-50 MG PO TABS
1.0000 | ORAL_TABLET | Freq: Two times a day (BID) | ORAL | Status: DC
  Filled 2022-12-22: qty 1

## 2022-12-22 NOTE — Progress Notes (Signed)
Patient unable to drink full dose of oral potassium this evening, drank roughly half of ordered dose (approximately 20 mEq). Route was previously changed d/t complaints of burning with IV administration. Notified NP, no new orders for potassium replacement at this time. Will reassess in am after morning labs.

## 2022-12-22 NOTE — Plan of Care (Signed)

## 2022-12-22 NOTE — Progress Notes (Signed)
Patient's IV infiltrated this evening during medication administration. Unable to administer Protonix and Ofirmev d/t loss of access. Patient is a hard stick, order placed for IV team to obtain new access. NP notified of medication delay.

## 2022-12-22 NOTE — Progress Notes (Signed)
PROGRESS NOTE    Suzanne [name]  GNF:[id number] DOB: Mar 17, 1976 DOA: 12/19/2022 PCP: Patient, No Pcp Per   Brief Narrative: 47 year old with past medical history significant for cannabis use associated hyperemesis, hypertension, IDA, reflux, previous endoscopy 2014/2016 suggestive of dysmotility and had dilation at that time by Dr. Christella Hartigan, repeat scope 2022 had gastritis, presents for evaluation of generalized weakness and nausea.  She initially had symptoms of fever, hot flashes, nausea and cough she tested positive for COVID on 7/16.  She was started on a course of Paxlovid on 7/19.  She has been feeling progressively weak and fatigue.  She has been having persistent epigastric pain radiates to her back.  She reports loose stool.  Evaluation in the ED chest x-ray was negative for consolidation of edema.     Assessment & Plan:   Principal Problem:   Abdominal pain with vomiting Active Problems:   Essential hypertension   COVID-19 virus infection   Asthma  1-Abdominal pain, nausea vomiting Dysphagia:  GI following.  She vomited last night.  Report epigastric pain. Started to eat some, very minimal.  On IV PPI BID>  RUQ US> Normal.  LFT and lipase Nl.  Eating a little more. Report burning  pain after eating. Plan to start Carafate.   COVID-19 viral infection: Denies respiratory symptoms.  Continue with support care.   Headaches: suspect related to covid, but will proceed with MRI brain.  Received IV benadryl and IV compazine and IV tylenol./  Feels better. Was not having headaches this am.  I have order Rizatriptan, she can bring med from home. She report nausea from nasal imitrex.  If HR increases will consider resuming propanolol.   Generalized weakness: Related to covid.  Out of bed.   Hypertension: Holding cozaar and hydrochlorothiazide, due to nausea and vomiting.  Started on Clonidine Patch.    Asthma: PRN nebulizer.          Estimated body mass index  is 28.98 kg/m as calculated from the following:   Height as of this encounter: 5' 4.17" (1.63 m).   Weight as of this encounter: 77 kg.   DVT prophylaxis: Lovenox Code Status: Full code Family Communication: Care discussed with patient Disposition Plan:  Status is: Inpatient Remains inpatient appropriate because: management of covid symptoms, poor oral intake.     Consultants:  GI  Procedures:  [place]  Antimicrobials:    Subjective: She is feeling better from headaches, eating some. Report burning pain after eating.   Objective: Vitals:   12/21/22 1307 12/21/22 1617 12/21/22 2029 12/22/22 0644  BP: (!) 154/90 (!) 142/86 (!) 140/84 118/76  Pulse: 74 71 69 (!) 50  Resp: 18 20 18 16   Temp: 98.7 F (37.1 C)  98.2 F (36.8 C) 98 F (36.7 C)  TempSrc: Oral  Oral Oral  SpO2: 100% 100% 100% 100%  Weight:      Height:        Intake/Output Summary (Last 24 hours) at 12/22/2022 1045 Last data filed at 12/21/2022 2029 Gross per 24 hour  Intake 1416.28 ml  Output --  Net 1416.28 ml   Filed Weights   12/20/22 0035  Weight: 77 kg    Examination:  General exam: NAD Respiratory system: CTA Cardiovascular system: S 1, S 2 RRR Gastrointestinal system: BS present, soft, nt Central nervous system: Non focal.  Extremities: Symmetric 5 x 5 power.    Data Reviewed: I have personally reviewed following labs and imaging studies  CBC: Recent Labs  Lab 12/19/22 1242 12/20/22 0712  WBC 5.4 7.5  NEUTROABS 2.4  --   HGB 12.0 11.0*  HCT 39.2 35.1*  MCV 75.1* 73.4*  PLT 220 182   Basic Metabolic Panel: Recent Labs  Lab 12/19/22 1242 12/20/22 0712 12/21/22 1512 12/22/22 0822  NA 137 136 137 137  K 3.7 3.7 3.4* 3.6  CL 100 104 102 104  CO2 28 25 24 24   GLUCOSE 98 96 83 98  BUN 16 10 15 20   CREATININE 0.92 0.73 0.78 0.93  CALCIUM 9.0 8.9 8.8* 8.5*   GFR: Estimated Creatinine Clearance: 75.4 mL/min (by C-G formula based on SCr of 0.93 mg/dL). Liver Function  Tests: Recent Labs  Lab 12/19/22 1242 12/20/22 0712 12/21/22 1512  AST 17 17 14*  ALT 15 15 12   ALKPHOS 68 58 54  BILITOT 0.6 0.7 0.5  PROT 7.9 6.8 6.9  ALBUMIN 3.9 3.5 3.4*   Recent Labs  Lab 12/19/22 1322  LIPASE 34   No results for input(s): "AMMONIA" in the last 168 hours. Coagulation Profile: No results for input(s): "INR", "PROTIME" in the last 168 hours. Cardiac Enzymes: Recent Labs  Lab 12/19/22 2058  CKTOTAL 54   BNP (last 3 results) No results for input(s): "PROBNP" in the last 8760 hours. HbA1C: No results for input(s): "HGBA1C" in the last 72 hours. CBG: Recent Labs  Lab 12/21/22 1316  GLUCAP 94   Lipid Profile: No results for input(s): "CHOL", "HDL", "LDLCALC", "TRIG", "CHOLHDL", "LDLDIRECT" in the last 72 hours. Thyroid Function Tests: No results for input(s): "TSH", "T4TOTAL", "FREET4", "T3FREE", "THYROIDAB" in the last 72 hours. Anemia Panel: Recent Labs    12/20/22 0711  FERRITIN 87  TIBC 272  IRON 71   Sepsis Labs: No results for input(s): "PROCALCITON", "LATICACIDVEN" in the last 168 hours.  Recent Results (from the past 240 hour(s))  SARS Coronavirus 2 by RT PCR (hospital order, performed in [facility] hospital lab) *cepheid single result test* Anterior Nasal Swab     Status: None   Collection Time: 12/20/22  1:19 AM   Specimen: Anterior Nasal Swab  Result Value Ref Range Status   SARS Coronavirus 2 by RT PCR NEGATIVE NEGATIVE Final    Comment: (NOTE) SARS-CoV-2 target nucleic acids are NOT DETECTED.  The SARS-CoV-2 RNA is generally detectable in upper and lower respiratory specimens during the acute phase of infection. The lowest concentration of SARS-CoV-2 viral copies this assay can detect is 250 copies / mL. A negative result does not preclude SARS-CoV-2 infection and should not be used as the sole basis for treatment or other patient management decisions.  A negative result may occur with improper specimen collection /  handling, submission of specimen other than nasopharyngeal swab, presence of viral mutation(s) within the areas targeted by this assay, and inadequate number of viral copies (<250 copies / mL). A negative result must be combined with clinical observations, patient history, and epidemiological information.  Fact Sheet for Patients:   RoadLapTop.co.za  Fact Sheet for Healthcare Providers: http://kim-miller.com/  This test is not yet approved or  cleared by the [place] FDA and has been authorized for detection and/or diagnosis of SARS-CoV-2 by FDA under an Emergency Use Authorization (EUA).  This EUA will remain in effect (meaning this test can be used) for the duration of the COVID-19 declaration under Section 564(b)(1) of the Act, 21 U.S.C. section 360bbb-3(b)(1), unless the authorization is terminated or revoked sooner.  Performed at [facility], [address]  [place], [place] [address]          Radiology Studies: US Abdomen Limited RUQ (LIVER/GB)  Result Date: 12/21/2022 CLINICAL DATA:  Abdominal pain EXAM: ULTRASOUND ABDOMEN LIMITED RIGHT UPPER QUADRANT COMPARISON:  CT from 06/05/2022 FINDINGS: Gallbladder: Within normal limits. Common bile duct: Diameter: 3.4 mm Liver: No focal lesion identified. Within normal limits in parenchymal echogenicity. Portal vein is patent on color Doppler imaging with normal direction of blood flow towards the liver. Other: None. IMPRESSION: No acute abnormality noted. Electronically Signed   By: Alcide Clever M.D.   On: 12/21/2022 19:56   MR BRAIN WO CONTRAST  Result Date: 12/21/2022 CLINICAL DATA:  Headache, new onset. EXAM: MRI HEAD WITHOUT CONTRAST TECHNIQUE: Multiplanar, multiecho pulse sequences of the brain and surrounding structures were obtained without intravenous contrast. COMPARISON:  None Available. FINDINGS: Brain: No acute infarct or hemorrhage. No mass or midline  shift. No hydrocephalus or extra-axial collection. Punctate focus of abnormal susceptibility in the left thalamus, likely chronic microhemorrhage. Vascular: Normal flow voids. Skull and upper cervical spine: Normal marrow signal. Sinuses/Orbits: Mucous retention cysts in the bilateral maxillary sinuses. Orbits are unremarkable. Other: None. IMPRESSION: No acute intracranial abnormality or mass. Electronically Signed   By: Orvan Falconer M.D.   On: 12/21/2022 14:35        Scheduled Meds:  cloNIDine  0.2 mg Transdermal Weekly   enoxaparin (LOVENOX) injection  40 mg Subcutaneous Q24H   loratadine  10 mg Oral Daily   pantoprazole (PROTONIX) IV  40 mg Intravenous Q12H   senna-docusate  1 tablet Oral BID   sucralfate  1 g Oral TID WC & HS   Continuous Infusions:  lactated ringers 50 mL/hr at 12/22/22 0645     LOS: 2 days    Time spent: 35 minutes    Jeanne Diefendorf A Abuk Selleck, MD [facility]   If 7PM-7AM, please contact night-coverage [email or web address]  12/22/2022, 10:45 AM

## 2022-12-22 NOTE — TOC Progression Note (Addendum)
Transition of Care [facility]) - Progression Note    Patient Details  Name: Suzanne [name] MRN: [id number] Date of Birth: [birth year]  Transition of Care [facility]) CM/SW Contact  Adrian Prows, RN Phone Number: 12/22/2022, 11:30 AM  Clinical Narrative:    Orders received for HHPT; pt agrees to recc; she does not have an agency preference; pt verified d/c address: [address] [place], [place] [address]; contacted Tresa Endo at [facility]; out of network; awaiting responses from [facility], [facility], [facility], and [facility].  -1157Lora Havens at [facility]- no; request sent to Marylene Land at [facility]; awaiting response  -1159Morrie Sheldon at [employer]- no -1200- Cory at [facility]- no -1250- Angela at [facility] - no -1252- Amy at [facility] contacted; she says agency can provide PT/OT services; pt notified; contact info placed in follow up provider section of d/c instructions.        Expected Discharge Plan and Services                                               Social Determinants of Health (SDOH) Interventions SDOH Screenings   Food Insecurity: No Food Insecurity (12/20/2022)  Recent Concern: Food Insecurity - Food Insecurity Present (12/16/2022)   Received from [facility]  Housing: Low Risk  (12/20/2022)  Transportation Needs: No Transportation Needs (12/20/2022)  Utilities: Not At Risk (12/20/2022)  Financial Resource Strain: High Risk (12/16/2022)   Received from [facility]  Physical Activity: Unknown (12/16/2022)   Received from [facility]  Social Connections: Somewhat Isolated (12/16/2022)   Received from [facility]  Stress: Stress Concern Present (12/16/2022)   Received from [facility]  Tobacco Use: Medium Risk (12/19/2022)    Readmission Risk Interventions     No data to display

## 2022-12-22 NOTE — Progress Notes (Signed)
Physical Therapy Treatment Patient Details Name: Suzanne [name] MRN: [id number] DOB: [birth year] Today's Date: 12/22/2022   History of Present Illness 47 yo female admitted with COVID?, abd pain, N/V. Hx of anemia, asthma, esophageal stricture, LBP, chronic pain, migraines, HSV, chronic dysphagia    PT Comments  The patient ambulated x 60' in room, did not use RW. Instructed and performed standing exercises for LE.   Continue PT for  progressive mobility.      Assistance Recommended at Discharge Intermittent Supervision/Assistance  If plan is discharge home, recommend the following:  Can travel by private vehicle    A little help with walking and/or transfers;A little help with bathing/dressing/bathroom;Assistance with cooking/housework;Assist for transportation;Help with stairs or ramp for entrance      Equipment Recommendations  Rolling walker (2 wheels) (continuing to assess)    Recommendations for Other Services       Precautions / Restrictions Precautions Precautions: Fall Precaution Comments: dizziness     Mobility  Bed Mobility               General bed mobility comments: in recliner    Transfers Overall transfer level: Needs assistance Equipment used: None, 1 person hand held assist Transfers: Sit to/from Stand Sit to Stand: Supervision                Ambulation/Gait Ambulation/Gait assistance: Min guard Gait Distance (Feet): 60 Feet Assistive device: 1 person hand held assist Gait Pattern/deviations: Step-through pattern Gait velocity: decr     General Gait Details: noted feet in motion during stnace, tends to roll to outside of feet   Stairs             Wheelchair Mobility     Tilt Bed    Modified Rankin (Stroke Patients Only)       Balance Overall balance assessment: Needs assistance Sitting-balance support: No upper extremity supported, Feet supported Sitting balance-Leahy Scale: Good     Standing balance  support: During functional activity, No upper extremity supported Standing balance-Leahy Scale: Fair                              Cognition Arousal/Alertness: Awake/alert Behavior During Therapy: Flat affect, WFL for tasks assessed/performed Overall Cognitive Status: Within Functional Limits for tasks assessed                                          Exercises General Exercises - Lower Extremity Long Arc Quad: AROM, Both, 10 reps Hip ABduction/ADduction: Both, AROM, 5 reps, Standing Hip Flexion/Marching: Both, 10 reps, Standing    General Comments        Pertinent Vitals/Pain Pain Assessment Pain Assessment: Faces Faces Pain Scale: Hurts even more Pain Location: legs Pain Descriptors / Indicators: Burning Pain Intervention(s): Monitored during session    Home Living                          Prior Function            PT Goals (current goals can now be found in the care plan section) Progress towards PT goals: Progressing toward goals    Frequency    Min 1X/week      PT Plan Current plan remains appropriate    Co-evaluation  AM-PAC PT "6 Clicks" Mobility   Outcome Measure  Help needed turning from your back to your side while in a flat bed without using bedrails?: None Help needed moving from lying on your back to sitting on the side of a flat bed without using bedrails?: None Help needed moving to and from a bed to a chair (including a wheelchair)?: A Little Help needed standing up from a chair using your arms (e.g., wheelchair or bedside chair)?: A Little Help needed to walk in hospital room?: A Little Help needed climbing 3-5 steps with a railing? : A Little 6 Click Score: 20    End of Session   Activity Tolerance: Patient tolerated treatment well Patient left: in chair;with call bell/phone within reach;with family/visitor present   PT Visit Diagnosis: Muscle weakness (generalized)  (M62.81);Difficulty in walking, not elsewhere classified (R26.2);Pain     Time: 1445-1500 PT Time Calculation (min) (ACUTE ONLY): 15 min  Charges:    $Gait Training: 8-22 mins PT General Charges $$ ACUTE PT VISIT: 1 Visit                     Blanchard Kelch PT Acute Rehabilitation Services Office [phone] Weekend pager-[phone]    Rada Hay 12/22/2022, 3:08 PM

## 2022-12-23 DIAGNOSIS — R1110 Vomiting, unspecified: Secondary | ICD-10-CM | POA: Diagnosis not present

## 2022-12-23 DIAGNOSIS — R109 Unspecified abdominal pain: Secondary | ICD-10-CM | POA: Diagnosis not present

## 2022-12-23 MED ORDER — PROCHLORPERAZINE EDISYLATE 10 MG/2ML IJ SOLN: 5.0000 mg | Freq: Once | INTRAMUSCULAR | Status: DC

## 2022-12-23 MED ORDER — HYDRALAZINE HCL 20 MG/ML IJ SOLN: 10.0000 mg | INTRAMUSCULAR | Status: DC | PRN

## 2022-12-23 MED ORDER — AMLODIPINE BESYLATE 5 MG PO TABS
5.0000 mg | ORAL_TABLET | Freq: Every day | ORAL | Status: DC
  Administered 2022-12-23: 5 mg via ORAL
  Filled 2022-12-23: qty 1

## 2022-12-23 MED ORDER — DIPHENHYDRAMINE HCL 50 MG/ML IJ SOLN
25.0000 mg | Freq: Once | INTRAMUSCULAR | Status: AC
  Administered 2022-12-23: 25 mg via INTRAVENOUS
  Filled 2022-12-23: qty 1

## 2022-12-23 MED ORDER — MORPHINE SULFATE (PF) 2 MG/ML IV SOLN: 1.0000 mg | INTRAVENOUS | Status: DC | PRN

## 2022-12-23 MED ORDER — VALPROATE SODIUM 100 MG/ML IV SOLN
500.0000 mg | Freq: Once | INTRAVENOUS | Status: DC
  Filled 2022-12-23: qty 5

## 2022-12-23 MED ORDER — SUMATRIPTAN SUCCINATE 25 MG PO TABS
25.0000 mg | ORAL_TABLET | Freq: Once | ORAL | Status: DC
  Filled 2022-12-23: qty 1

## 2022-12-23 NOTE — Progress Notes (Signed)
RN spoke with patient and she is requesting to speak with either case management or social worker prior to discharge. She states that she has recently became unemployed and needs assistance with the cost of her prescription medications.

## 2022-12-23 NOTE — Plan of Care (Signed)

## 2022-12-23 NOTE — Progress Notes (Signed)
PROGRESS NOTE    SHONTA SURATT  ZOX:[id number] DOB: 1976-01-05 DOA: 12/19/2022 PCP: Patient, No Pcp Per   Brief Narrative: 47 year old with past medical history significant for cannabis use associated hyperemesis, hypertension, IDA, reflux, previous endoscopy 2014/2016 suggestive of dysmotility and had dilation at that time by Dr. Christella Hartigan, repeat scope 2022 had gastritis, presents for evaluation of generalized weakness and nausea.  She initially had symptoms of fever, hot flashes, nausea and cough she tested positive for COVID on 7/16.  She was started on a course of Paxlovid on 7/19.  She has been feeling progressively weak and fatigue.  She has been having persistent epigastric pain radiates to her back.  She reports loose stool.  Evaluation in the ED chest x-ray was negative for consolidation of edema.     Assessment & Plan:   Principal Problem:   Abdominal pain with vomiting Active Problems:   Essential hypertension   COVID-19 virus infection   Asthma  1-Abdominal pain, nausea vomiting Dysphagia:  GI following.  She vomited last night.  Report epigastric pain. Started to eat some, very minimal.  On IV PPI BID>  RUQ US> Normal.  LFT and lipase Nl.  Report some improvement with Carafate.   COVID-19 viral infection: Denies respiratory symptoms.  Continue with support care.   Headaches: Suspect related to migraine exacerbated by covid,.  MRI: Negative Received IV benadryl and IV compazine and IV tylenol./  I have order Rizatriptan, she can bring med from home. She report nausea from nasal imitrex.  Unable to resume propanolol HR in the 60.  Having headaches again today. Discussed with neurology: repeat IV benadryl, Compazine, IV valproic acid.  Family will bring home meds: Rizatriptan.   Generalized weakness: Related to covid.  Out of bed.   Hypertension: Holding cozaar and hydrochlorothiazide, due to nausea and vomiting.  Started on Clonidine Patch.  Resume  Norvasc.  PRN Hydralazine. I asked nurse to recheck BP  Asthma: PRN nebulizer.          Estimated body mass index is 28.98 kg/m as calculated from the following:   Height as of this encounter: 5' 4.17" (1.63 m).   Weight as of this encounter: 77 kg.   DVT prophylaxis: Lovenox Code Status: Full code Family Communication: Care discussed with patient Disposition Plan:  Status is: Inpatient Remains inpatient appropriate because: management of covid symptoms, poor oral intake.     Consultants:  GI  Procedures:  [place]  Antimicrobials:    Subjective: She is having headaches today again.  Report benadryl and compazine helps.   Objective: Vitals:   12/22/22 1542 12/22/22 2109 12/23/22 0624 12/23/22 1256  BP: (!) 153/87 (!) 154/97 (!) 163/91 (!) 178/85  Pulse: (!) 57 77 68 (!) 54  Resp: 16 16 16    Temp: 98.2 F (36.8 C) 98.2 F (36.8 C) 98.4 F (36.9 C) (!) 97.4 F (36.3 C)  TempSrc: Oral Oral  Oral  SpO2: 100% 100% 100% 100%  Weight:      Height:        Intake/Output Summary (Last 24 hours) at 12/23/2022 1540 Last data filed at 12/23/2022 0930 Gross per 24 hour  Intake 240 ml  Output --  Net 240 ml   Filed Weights   12/20/22 0035  Weight: 77 kg    Examination:  General exam: NAD Respiratory system: CTA Cardiovascular system: S 1, S 2 RRR Gastrointestinal system: BS present, soft, nt Central nervous system: alert Extremities: no edema   Data Reviewed: I  have personally reviewed following labs and imaging studies  CBC: Recent Labs  Lab 12/19/22 1242 12/20/22 0712  WBC 5.4 7.5  NEUTROABS 2.4  --   HGB 12.0 11.0*  HCT 39.2 35.1*  MCV 75.1* 73.4*  PLT 220 182   Basic Metabolic Panel: Recent Labs  Lab 12/19/22 1242 12/20/22 0712 12/21/22 1512 12/22/22 0822  NA 137 136 137 137  K 3.7 3.7 3.4* 3.6  CL 100 104 102 104  CO2 28 25 24 24   GLUCOSE 98 96 83 98  BUN 16 10 15 20   CREATININE 0.92 0.73 0.78 0.93  CALCIUM 9.0 8.9 8.8* 8.5*    GFR: Estimated Creatinine Clearance: 75.4 mL/min (by C-G formula based on SCr of 0.93 mg/dL). Liver Function Tests: Recent Labs  Lab 12/19/22 1242 12/20/22 0712 12/21/22 1512  AST 17 17 14*  ALT 15 15 12   ALKPHOS 68 58 54  BILITOT 0.6 0.7 0.5  PROT 7.9 6.8 6.9  ALBUMIN 3.9 3.5 3.4*   Recent Labs  Lab 12/19/22 1322  LIPASE 34   No results for input(s): "AMMONIA" in the last 168 hours. Coagulation Profile: No results for input(s): "INR", "PROTIME" in the last 168 hours. Cardiac Enzymes: Recent Labs  Lab 12/19/22 2058  CKTOTAL 54   BNP (last 3 results) No results for input(s): "PROBNP" in the last 8760 hours. HbA1C: No results for input(s): "HGBA1C" in the last 72 hours. CBG: Recent Labs  Lab 12/21/22 1316  GLUCAP 94   Lipid Profile: No results for input(s): "CHOL", "HDL", "LDLCALC", "TRIG", "CHOLHDL", "LDLDIRECT" in the last 72 hours. Thyroid Function Tests: No results for input(s): "TSH", "T4TOTAL", "FREET4", "T3FREE", "THYROIDAB" in the last 72 hours. Anemia Panel: No results for input(s): "VITAMINB12", "FOLATE", "FERRITIN", "TIBC", "IRON", "RETICCTPCT" in the last 72 hours.  Sepsis Labs: No results for input(s): "PROCALCITON", "LATICACIDVEN" in the last 168 hours.  Recent Results (from the past 240 hour(s))  SARS Coronavirus 2 by RT PCR (hospital order, performed in [facility] hospital lab) *cepheid single result test* Anterior Nasal Swab     Status: None   Collection Time: 12/20/22  1:19 AM   Specimen: Anterior Nasal Swab  Result Value Ref Range Status   SARS Coronavirus 2 by RT PCR NEGATIVE NEGATIVE Final    Comment: (NOTE) SARS-CoV-2 target nucleic acids are NOT DETECTED.  The SARS-CoV-2 RNA is generally detectable in upper and lower respiratory specimens during the acute phase of infection. The lowest concentration of SARS-CoV-2 viral copies this assay can detect is 250 copies / mL. A negative result does not preclude SARS-CoV-2 infection and  should not be used as the sole basis for treatment or other patient management decisions.  A negative result may occur with improper specimen collection / handling, submission of specimen other than nasopharyngeal swab, presence of viral mutation(s) within the areas targeted by this assay, and inadequate number of viral copies (<250 copies / mL). A negative result must be combined with clinical observations, patient history, and epidemiological information.  Fact Sheet for Patients:   RoadLapTop.co.za  Fact Sheet for Healthcare Providers: http://kim-miller.com/  This test is not yet approved or  cleared by the [place] FDA and has been authorized for detection and/or diagnosis of SARS-CoV-2 by FDA under an Emergency Use Authorization (EUA).  This EUA will remain in effect (meaning this test can be used) for the duration of the COVID-19 declaration under Section 564(b)(1) of the Act, 21 U.S.C. section 360bbb-3(b)(1), unless the authorization is terminated or revoked sooner.  Performed at [facility], [address], [place], [place] [address]          Radiology Studies: US Abdomen Limited RUQ (LIVER/GB)  Result Date: 12/21/2022 CLINICAL DATA:  Abdominal pain EXAM: ULTRASOUND ABDOMEN LIMITED RIGHT UPPER QUADRANT COMPARISON:  CT from 06/05/2022 FINDINGS: Gallbladder: Within normal limits. Common bile duct: Diameter: 3.4 mm Liver: No focal lesion identified. Within normal limits in parenchymal echogenicity. Portal vein is patent on color Doppler imaging with normal direction of blood flow towards the liver. Other: None. IMPRESSION: No acute abnormality noted. Electronically Signed   By: Alcide Clever M.D.   On: 12/21/2022 19:56        Scheduled Meds:  amLODipine  5 mg Oral Daily   cloNIDine  0.2 mg Transdermal Weekly   enoxaparin (LOVENOX) injection  40 mg Subcutaneous Q24H   loratadine  10 mg Oral Daily    pantoprazole (PROTONIX) IV  40 mg Intravenous Q12H   prochlorperazine  5 mg Intravenous Once   sucralfate  1 g Oral TID WC & HS   Continuous Infusions:  valproate sodium       LOS: 3 days    Time spent: 35 minutes    Foxx Klarich A Rudolpho Claxton, MD [facility]   If 7PM-7AM, please contact night-coverage [email or web address]  12/23/2022, 3:40 PM

## 2022-12-24 ENCOUNTER — Other Ambulatory Visit (HOSPITAL_COMMUNITY): Payer: Self-pay

## 2022-12-24 ENCOUNTER — Encounter: Payer: Self-pay | Admitting: Oncology

## 2022-12-24 DIAGNOSIS — R1110 Vomiting, unspecified: Secondary | ICD-10-CM | POA: Diagnosis not present

## 2022-12-24 DIAGNOSIS — R109 Unspecified abdominal pain: Secondary | ICD-10-CM | POA: Diagnosis not present

## 2022-12-24 MED ORDER — ALBUTEROL SULFATE (2.5 MG/3ML) 0.083% IN NEBU
2.5000 mg | INHALATION_SOLUTION | Freq: Four times a day (QID) | RESPIRATORY_TRACT | 12 refills | Status: AC | PRN
  Filled 2022-12-24: qty 90, 8d supply, fill #0

## 2022-12-24 MED ORDER — PANTOPRAZOLE SODIUM 40 MG PO TBEC: 40.0000 mg | DELAYED_RELEASE_TABLET | Freq: Two times a day (BID) | ORAL | 1 refills | Status: DC

## 2022-12-24 MED ORDER — SUCRALFATE 1 GM/10ML PO SUSP: 1.0000 g | Freq: Three times a day (TID) | ORAL | 0 refills | Status: DC

## 2022-12-24 MED ORDER — AMLODIPINE BESYLATE 10 MG PO TABS
10.0000 mg | ORAL_TABLET | Freq: Every day | ORAL | 0 refills | Status: AC
  Filled 2022-12-24: qty 30, 30d supply, fill #0

## 2022-12-24 MED ORDER — CETIRIZINE HCL 10 MG PO TABS: 10.0000 mg | ORAL_TABLET | Freq: Every day | ORAL | 0 refills | Status: AC

## 2022-12-24 MED ORDER — PANTOPRAZOLE SODIUM 40 MG PO TBEC
40.0000 mg | DELAYED_RELEASE_TABLET | Freq: Two times a day (BID) | ORAL | 1 refills | Status: AC
  Filled 2022-12-24: qty 30, 15d supply, fill #0

## 2022-12-24 MED ORDER — RIZATRIPTAN BENZOATE 10 MG PO TABS
10.0000 mg | ORAL_TABLET | ORAL | 0 refills | Status: AC | PRN
  Filled 2022-12-24: qty 8, 15d supply, fill #0

## 2022-12-24 MED ORDER — AMLODIPINE BESYLATE 10 MG PO TABS
10.0000 mg | ORAL_TABLET | Freq: Every day | ORAL | 0 refills | Status: DC
Start: 2022-12-24 — End: 2022-12-24

## 2022-12-24 MED ORDER — AMLODIPINE BESYLATE 10 MG PO TABS
10.0000 mg | ORAL_TABLET | Freq: Every day | ORAL | Status: DC
  Administered 2022-12-24: 10 mg via ORAL
  Filled 2022-12-24: qty 1

## 2022-12-24 MED ORDER — CLONIDINE 0.2 MG/24HR TD PTWK: 0.2000 mg | MEDICATED_PATCH | TRANSDERMAL | 12 refills | Status: DC

## 2022-12-24 MED ORDER — ONE-A-DAY WOMENS FORMULA PO TABS: 1.0000 | ORAL_TABLET | Freq: Every day | ORAL | 0 refills | Status: AC

## 2022-12-24 MED ORDER — CLONIDINE 0.2 MG/24HR TD PTWK
0.2000 mg | MEDICATED_PATCH | TRANSDERMAL | 12 refills | Status: AC
  Filled 2022-12-24: qty 4, 28d supply, fill #0

## 2022-12-24 MED ORDER — RIZATRIPTAN BENZOATE 10 MG PO TABS: 10.0000 mg | ORAL_TABLET | ORAL | 0 refills | Status: DC | PRN

## 2022-12-24 MED ORDER — SUCRALFATE 1 GM/10ML PO SUSP
1.0000 g | Freq: Three times a day (TID) | ORAL | 0 refills | Status: AC
  Filled 2022-12-24: qty 420, 11d supply, fill #0

## 2022-12-24 NOTE — Progress Notes (Signed)
[facility].This RN followed up with Dr Sunnie Nielsen who had decided not to order it due to increased risk of thrombosis due to recent COVID infection. Pt updated by  primary RN Megan at bedside. Pt to pick up other meds after d/c  - delivery not available today

## 2022-12-24 NOTE — Plan of Care (Signed)
Patient was given Maxalt for headache, see MAR. Relief reported. No shortness of breath. Patient given Dulcolax supp. No reports of bowel movement yet. VS stable and within baseline. Needs attended. Maintained safety throughout this shift.    Problem: Education: Goal: Knowledge of General Education information will improve Description: Including pain rating scale, medication(s)/side effects and non-pharmacologic comfort measures Outcome: Progressing   Problem: Health Behavior/Discharge Planning: Goal: Ability to manage health-related needs will improve Outcome: Progressing   Problem: Clinical Measurements: Goal: Ability to maintain clinical measurements within normal limits will improve Outcome: Progressing   Problem: Activity: Goal: Risk for activity intolerance will decrease Outcome: Progressing   Problem: Nutrition: Goal: Adequate nutrition will be maintained Outcome: Progressing   Problem: Coping: Goal: Level of anxiety will decrease Outcome: Progressing   Problem: Elimination: Goal: Will not experience complications related to bowel motility Outcome: Progressing   Problem: Pain Managment: Goal: General experience of comfort will improve Outcome: Progressing   Problem: Safety: Goal: Ability to remain free from injury will improve Outcome: Progressing

## 2022-12-24 NOTE — TOC Transition Note (Signed)
Transition of Care [facility]) - CM/SW Discharge Note   Patient Details  Name: Suzanne [name] MRN: [id number] Date of Birth: 1975/10/06  [facility]) CM/SW Contact:  Adrian Prows, RN Phone Number: 12/24/2022, 10:39 AM   Clinical Narrative:    D/C, and RW orders received; pt agrees to receive DME; spoke w/ Vaughan Basta at [facility], and he says device will be delivered to room;  HHPT/OT previously arranged w/ [facility]; Amy at agency notified of pt's d/c; no TOC needs.     Barriers to Discharge: No Barriers Identified   Patient Goals and CMS Choice      Discharge Placement                         Discharge Plan and Services Additional resources added to the After Visit Summary for                  DME Arranged: Walker rolling DME Agency: [employer] Date DME Agency Contacted: 12/24/22 Time DME Agency Contacted: 1038 Representative spoke with at DME Agency: [facility] HH Arranged: PT, OT HH Agency: [facility] Date [facility] Agency Contacted: 12/22/22 Time HH Agency Contacted: [phone] Representative spoke with at [facility] Agency: Amy Hyatt  Social Determinants of Health (SDOH) Interventions SDOH Screenings   Food Insecurity: No Food Insecurity (12/20/2022)  Recent Concern: Food Insecurity - Food Insecurity Present (12/16/2022)   Received from [facility]  Housing: Low Risk  (12/20/2022)  Transportation Needs: No Transportation Needs (12/20/2022)  Utilities: Not At Risk (12/20/2022)  Financial Resource Strain: High Risk (12/16/2022)   Received from [facility]  Physical Activity: Unknown (12/16/2022)   Received from [facility]  Social Connections: Somewhat Isolated (12/16/2022)   Received from [facility]  Stress: Stress Concern Present (12/16/2022)   Received from [facility]  Tobacco Use: Medium Risk (12/19/2022)     Readmission Risk Interventions     No data to display

## 2022-12-24 NOTE — Discharge Summary (Signed)
Physician Discharge Summary   Patient: Suzanne [name] MRN: [id number] DOB: April 26, 1976  Admit date:     12/19/2022  Discharge date: 12/24/22  Discharge Physician: Alba Cory   PCP: Patient, No Pcp Per   Recommendations at discharge:    Transition from clonidine patch to Diovan and hydrochlorothiazide if no further abdominal complaints.  Follow up with GI for planned endoscopy  Discharge Diagnoses: Principal Problem:   Abdominal pain with vomiting Active Problems:   Essential hypertension   COVID-19 virus infection   Asthma  Resolved Problems:   * No resolved hospital problems. [facility] Course: 47 year old with past medical history significant for cannabis use associated hyperemesis, hypertension, IDA, reflux, previous endoscopy 2014/2016 suggestive of dysmotility and had dilation at that time by Dr. Christella Hartigan, repeat scope 2022 had gastritis, presents for evaluation of generalized weakness and nausea.  She initially had symptoms of fever, hot flashes, nausea and cough she tested positive for COVID on 7/16.  She was started on a course of Paxlovid on 7/19.  She has been feeling progressively weak and fatigue.  She has been having persistent epigastric pain radiates to her back.  She reports loose stool.   Evaluation in the ED chest x-ray was negative for consolidation of edema.    Assessment and Plan: 1-Abdominal pain, nausea vomiting Dysphagia:  GI following.  Treated with  IV PPI BID>  RUQ US> Normal.  LFT and lipase Nl.  Report some improvement with Carafate.  Tolerating diet.   COVID-19 viral infection: Denies respiratory symptoms.  Continue with support care.    Headaches: Suspect related to migraine exacerbated by covid,.  MRI: Negative Received IV benadryl and IV compazine and IV tylenol./  I have order Rizatriptan, she can bring med from home. She report nausea from nasal imitrex.  Unable to resume propanolol HR in the 60.  Discussed with neurology on  7/26:  repeat IV benadryl, Compazine, IV valproic acid.  Family will bring home meds: Rizatriptan.  Headaches better. Stable for discharge.   Generalized weakness: Related to covid.  Out of bed.    Hypertension: Holding cozaar and hydrochlorothiazide, due to nausea and vomiting.  Started on Clonidine Patch.  Resume Norvasc.  PRN Hydralazine. I asked nurse to recheck BP   Asthma: PRN nebulizer.            Consultants: Neurology phone Procedures performed:  Disposition: Home Diet recommendation:  Discharge Diet Orders (From admission, onward)     Start     Ordered   12/24/22 0000  Diet - low sodium heart healthy        12/24/22 0954           Cardiac diet DISCHARGE MEDICATION: Allergies as of 12/24/2022       Reactions   Iodinated Contrast Media Rash, Anaphylaxis   Reglan [metoclopramide] Shortness Of Breath   Amoxicillin Rash   Has patient had a PCN reaction causing immediate rash, facial/tongue/throat swelling, SOB or lightheadedness with hypotension: Yes Has patient had a PCN reaction causing severe rash involving mucus membranes or skin necrosis: Broke out face & buttocks Has patient had a PCN reaction that required hospitalization: No Has patient had a PCN reaction occurring within the last 10 years: No If all of the above answers are "NO", then may proceed with Cephalosporin use   Gabapentin Swelling   Pregabalin Swelling, Anxiety, Other (See Comments)   Paranoia, also   Lisinopril Cough   Losartan Cough   Sulfa Antibiotics Nausea And  Vomiting        Medication List     STOP taking these medications    diclofenac 50 MG EC tablet Commonly known as: VOLTAREN   fluconazole 150 MG tablet Commonly known as: Diflucan   hydrochlorothiazide 25 MG tablet Commonly known as: HYDRODIURIL   ketorolac 10 MG tablet Commonly known as: TORADOL   metroNIDAZOLE 500 MG tablet Commonly known as: FLAGYL   nirmatrelvir & ritonavir 20 x 150 MG & 10 x 100MG   Tbpk Commonly known as: PAXLOVID   nitrofurantoin (macrocrystal-monohydrate) 100 MG capsule Commonly known as: MACROBID   omeprazole 20 MG capsule Commonly known as: PRILOSEC   ondansetron 4 MG tablet Commonly known as: ZOFRAN   ondansetron 8 MG disintegrating tablet Commonly known as: ZOFRAN-ODT   promethazine 25 MG tablet Commonly known as: PHENERGAN   propranolol ER 60 MG 24 hr capsule Commonly known as: INDERAL LA   valsartan 160 MG tablet Commonly known as: DIOVAN       TAKE these medications    albuterol (2.5 MG/3ML) 0.083% nebulizer solution Commonly known as: PROVENTIL Take 3 mLs (2.5 mg total) by nebulization every 6 (six) hours as needed for wheezing or shortness of breath. What changed: Another medication with the same name was removed. Continue taking this medication, and follow the directions you see here.   amLODipine 10 MG tablet Commonly known as: NORVASC Take 1 tablet (10 mg total) by mouth daily.   cetirizine 10 MG tablet Commonly known as: ZyrTEC Allergy Take 1 tablet (10 mg total) by mouth daily. What changed:  when to take this reasons to take this   cloNIDine 0.2 mg/24hr patch Commonly known as: CATAPRES - Dosed in mg/24 hr Place 1 patch (0.2 mg total) onto the skin once a week. Start taking on: December 27, 2022   metroNIDAZOLE 1 % gel Commonly known as: METROGEL Apply 1 Application topically See admin instructions. Apply as directed 2 times a day- to affected area(s)   One-A-Day Womens Formula Tabs Take 1 tablet by mouth daily.   pantoprazole 40 MG tablet Commonly known as: Protonix Take 1 tablet (40 mg total) by mouth 2 (two) times daily.   rizatriptan 10 MG tablet Commonly known as: MAXALT Take 1 tablet (10 mg total) by mouth as needed for migraine (may repeat once in 2 hours, if no relief).   sucralfate 1 GM/10ML suspension Commonly known as: Carafate Take 10 mLs (1 g total) by mouth 4 (four) times daily -  with meals and at  bedtime.        Follow-up Information     [facility] Follow up.   Why: Home Health Physical Therapy and Occupational Therapy Contact information: [address] [place] [address] [phone]                Discharge Exam: Filed Weights   12/20/22 0035  Weight: 77 kg   General; NAD  Condition at discharge: stable  The results of significant diagnostics from this hospitalization (including imaging, microbiology, ancillary and laboratory) are listed below for reference.   Imaging Studies: US Abdomen Limited RUQ (LIVER/GB)  Result Date: 12/21/2022 CLINICAL DATA:  Abdominal pain EXAM: ULTRASOUND ABDOMEN LIMITED RIGHT UPPER QUADRANT COMPARISON:  CT from 06/05/2022 FINDINGS: Gallbladder: Within normal limits. Common bile duct: Diameter: 3.4 mm Liver: No focal lesion identified. Within normal limits in parenchymal echogenicity. Portal vein is patent on color Doppler imaging with normal direction of blood flow towards the liver.  Other: None. IMPRESSION: No acute abnormality noted. Electronically Signed   By: Alcide Clever M.D.   On: 12/21/2022 19:56   MR BRAIN WO CONTRAST  Result Date: 12/21/2022 CLINICAL DATA:  Headache, new onset. EXAM: MRI HEAD WITHOUT CONTRAST TECHNIQUE: Multiplanar, multiecho pulse sequences of the brain and surrounding structures were obtained without intravenous contrast. COMPARISON:  None Available. FINDINGS: Brain: No acute infarct or hemorrhage. No mass or midline shift. No hydrocephalus or extra-axial collection. Punctate focus of abnormal susceptibility in the left thalamus, likely chronic microhemorrhage. Vascular: Normal flow voids. Skull and upper cervical spine: Normal marrow signal. Sinuses/Orbits: Mucous retention cysts in the bilateral maxillary sinuses. Orbits are unremarkable. Other: None. IMPRESSION: No acute intracranial abnormality or mass. Electronically Signed   By: Orvan Falconer M.D.   On: 12/21/2022 14:35    DG Chest 2 View  Result Date: 12/19/2022 CLINICAL DATA:  CP/SOB EXAM: CHEST - 2 VIEW COMPARISON:  CXR 02/10/22 FINDINGS: No pleural effusion. No pneumothorax. Normal cardiac and mediastinal contours. No focal airspace opacity. No radiographically apparent displaced rib fractures. Visualized upper abdomen is unremarkable. Vertebral body heights are maintained. IMPRESSION: No focal airspace opacity Electronically Signed   By: Lorenza Cambridge M.D.   On: 12/19/2022 13:05    Microbiology: Results for orders placed or performed during the hospital encounter of 12/19/22  SARS Coronavirus 2 by RT PCR (hospital order, performed in [facility] hospital lab) *cepheid single result test* Anterior Nasal Swab     Status: None   Collection Time: 12/20/22  1:19 AM   Specimen: Anterior Nasal Swab  Result Value Ref Range Status   SARS Coronavirus 2 by RT PCR NEGATIVE NEGATIVE Final    Comment: (NOTE) SARS-CoV-2 target nucleic acids are NOT DETECTED.  The SARS-CoV-2 RNA is generally detectable in upper and lower respiratory specimens during the acute phase of infection. The lowest concentration of SARS-CoV-2 viral copies this assay can detect is 250 copies / mL. A negative result does not preclude SARS-CoV-2 infection and should not be used as the sole basis for treatment or other patient management decisions.  A negative result may occur with improper specimen collection / handling, submission of specimen other than nasopharyngeal swab, presence of viral mutation(s) within the areas targeted by this assay, and inadequate number of viral copies (<250 copies / mL). A negative result must be combined with clinical observations, patient history, and epidemiological information.  Fact Sheet for Patients:   RoadLapTop.co.za  Fact Sheet for Healthcare Providers: http://kim-miller.com/  This test is not yet approved or  cleared by the [place] FDA and has been  authorized for detection and/or diagnosis of SARS-CoV-2 by FDA under an Emergency Use Authorization (EUA).  This EUA will remain in effect (meaning this test can be used) for the duration of the COVID-19 declaration under Section 564(b)(1) of the Act, 21 U.S.C. section 360bbb-3(b)(1), unless the authorization is terminated or revoked sooner.  Performed at [facility] [address] [place], [place] [address]     Labs: CBC: Recent Labs  Lab 12/19/22 1242 12/20/22 0712  WBC 5.4 7.5  NEUTROABS 2.4  --   HGB 12.0 11.0*  HCT 39.2 35.1*  MCV 75.1* 73.4*  PLT 220 182   Basic Metabolic Panel: Recent Labs  Lab 12/19/22 1242 12/20/22 0712 12/21/22 1512 12/22/22 0822  NA 137 136 137 137  K 3.7 3.7 3.4* 3.6  CL 100 104 102 104  CO2 28 25 24 24   GLUCOSE 98 96 83 98  BUN 16 10 15 20   CREATININE 0.92 0.73 0.78 0.93  CALCIUM 9.0 8.9 8.8* 8.5*   Liver Function Tests: Recent Labs  Lab 12/19/22 1242 12/20/22 0712 12/21/22 1512  AST 17 17 14*  ALT 15 15 12   ALKPHOS 68 58 54  BILITOT 0.6 0.7 0.5  PROT 7.9 6.8 6.9  ALBUMIN 3.9 3.5 3.4*   CBG: Recent Labs  Lab 12/21/22 1316  GLUCAP 94    Discharge time spent: greater than 30 minutes.  Signed: Alba Cory, MD [facility] 12/24/2022

## 2022-12-26 NOTE — Telephone Encounter (Signed)
Can you set this pt up for an EGD in the [facility] with Dr Suzanne [name]?  Thank you

## 2022-12-28 ENCOUNTER — Ambulatory Visit (INDEPENDENT_AMBULATORY_CARE_PROVIDER_SITE_OTHER): Payer: Managed Care, Other (non HMO)

## 2022-12-28 ENCOUNTER — Encounter: Payer: Self-pay | Admitting: Gastroenterology

## 2022-12-28 ENCOUNTER — Ambulatory Visit: Payer: Managed Care, Other (non HMO) | Admitting: Family Medicine

## 2022-12-28 ENCOUNTER — Encounter: Payer: Self-pay | Admitting: Family Medicine

## 2022-12-28 VITALS — BP 160/92 | HR 82 | Ht 64.2 in | Wt 172.0 lb

## 2022-12-28 DIAGNOSIS — M542 Cervicalgia: Secondary | ICD-10-CM | POA: Diagnosis not present

## 2022-12-28 DIAGNOSIS — R252 Cramp and spasm: Secondary | ICD-10-CM | POA: Diagnosis not present

## 2022-12-28 DIAGNOSIS — G8929 Other chronic pain: Secondary | ICD-10-CM

## 2022-12-28 DIAGNOSIS — R5383 Other fatigue: Secondary | ICD-10-CM

## 2022-12-28 DIAGNOSIS — M5441 Lumbago with sciatica, right side: Secondary | ICD-10-CM

## 2022-12-28 DIAGNOSIS — M5442 Lumbago with sciatica, left side: Secondary | ICD-10-CM | POA: Diagnosis not present

## 2022-12-28 LAB — COMPREHENSIVE METABOLIC PANEL
ALT: 16 U/L (ref 0–35)
AST: 16 U/L (ref 0–37)
Albumin: 3.9 g/dL (ref 3.5–5.2)
Alkaline Phosphatase: 69 U/L (ref 39–117)
BUN: 17 mg/dL (ref 6–23)
CO2: 30 mEq/L (ref 19–32)
Calcium: 9.2 mg/dL (ref 8.4–10.5)
Chloride: 100 mEq/L (ref 96–112)
Creatinine, Ser: 0.79 mg/dL (ref 0.40–1.20)
GFR: 89.08 mL/min (ref >60.00)
Glucose, Bld: 91 mg/dL (ref 70–99)
Potassium: 3.5 mEq/L (ref 3.5–5.1)
Sodium: 135 mEq/L (ref 135–145)
Total Bilirubin: 0.2 mg/dL (ref 0.2–1.2)
Total Protein: 7.3 g/dL (ref 6.0–8.3)

## 2022-12-28 LAB — TSH: TSH: 1.26 u[IU]/mL (ref 0.35–5.50)

## 2022-12-28 MED ORDER — BACLOFEN 10 MG PO TABS: 10.0000 mg | ORAL_TABLET | Freq: Three times a day (TID) | ORAL | 1 refills | Status: AC | PRN

## 2022-12-28 NOTE — Progress Notes (Unsigned)
I, Suzanne [name], CMA acting as a scribe for Suzanne Graham, MD.  Suzanne [name] is a 47 y.o. female who presents to [facility] at [facility] today for bilateral leg pain and weakness.  Suzanne [name] was recently discharged from the hospital on July 27 after a 5-day stay for abdominal pain and vomiting and COVID.  She presents to clinic today noting sx present prior to being dx with Covid. Notes cramping in the hands and the feet. Staying hydrated. Diet has been off since having Covid. C/O weakness in BILAT UE, L>R and B LE. Notes decreased grip strength bilaterally. Notes chronic neck and lower back pain. Pain radiates into the UE and LE. Stands a lot with work.   She has had extensive treatment in the past for chronic back pain with various injections.  This did help some but not enough.  It has been years since she has had any epidural steroid injections or similar.  Additionally she has had a trial of physical therapy for back pain in the past but it also has been years.    Pertinent review of systems: No fevers or chills  Relevant historical information: Hypertension   Exam:  BP (!) 160/92   Pulse 82   Ht 5' 4.2" (1.631 m)   Wt 172 lb (78 kg)   SpO2 100%   BMI 29.34 kg/m  General: Well Developed, well nourished, and in no acute distress.   MSK: C-spine: Normal appearing. Normal cervical motion. Upper extremity strength is intact. Reflexes are intact.  L-spine: Normal appearing. Nontender palpation spinal midline. Decreased lumbar motion. Lower extremity strength is intact. Reflexes are intact.    Lab and Radiology Results  X-ray images cervical spine obtained today personally and independently interpreted Cervical lordosis indicating spasm.  Anterior spur formation at C4-5.  No acute fractures are visible. Await formal radiology review  CT scan images lumbar spine visible on CT scan abdomen and pelvis from January 2024 personally and independently  interpreted today.  Mild facet DJD L5-S1.     Assessment and Plan: 47 y.o. female with chronic neck and back pain worsening recently after a hospitalization.  This is a chronic ongoing issue that worsened recently.  Additionally she notes vague weakness upper and lower extremities and muscle cramping and spasms.  She did have some electrolyte abnormalities while in hospitalized including low calcium.  Will go ahead and check basic electrolytes parathyroid hormone TSH.  Will refer to physical therapy and will have a trial of baclofen at bedtime.  If not improving likely will proceed to advanced imaging on recheck.  Recheck in 1 month.   PDMP not reviewed this encounter. Orders Placed This Encounter  Procedures   DG Cervical Spine 2 or 3 views    Standing Status:   Future    Number of Occurrences:   1    Standing Expiration Date:   12/28/2023    Order Specific Question:   Reason for Exam (SYMPTOM  OR DIAGNOSIS REQUIRED)    Answer:   eval pain cspine    Order Specific Question:   Is patient pregnant?    Answer:   No    Order Specific Question:   Preferred imaging location?    Answer:   Suzanne [name]   Comprehensive metabolic panel    Standing Status:   Future    Number of Occurrences:   1    Standing Expiration Date:   12/28/2023   TSH   PTH,  Intact and Calcium    Standing Status:   Future    Number of Occurrences:   1    Standing Expiration Date:   12/28/2023   Ambulatory referral to Physical Therapy    Referral Priority:   Routine    Referral Type:   Physical Medicine    Referral Reason:   Specialty Services Required    Requested Specialty:   Physical Therapy   Meds ordered this encounter  Medications   baclofen (LIORESAL) 10 MG tablet    Sig: Take 1 tablet (10 mg total) by mouth 3 (three) times daily as needed for muscle spasms.    Dispense:  90 each    Refill:  1     Discussed warning signs or symptoms. Please see discharge instructions. Patient expresses  understanding.   The above documentation has been reviewed and is accurate and complete Suzanne [name], M.D.

## 2022-12-28 NOTE — Telephone Encounter (Signed)
Lvm for patient to call back to schedule EGD

## 2022-12-28 NOTE — Patient Instructions (Signed)
Thank you for coming in today.   Please get an Xray today before you leave   Please get labs today before you leave   I've referred you to Physical Therapy.  Let us know if you don't hear from them in one week.   Recheck in 1 month

## 2022-12-30 NOTE — Progress Notes (Signed)
Labs look much better compared to your hospitalization.  No significant abnormalities seen.

## 2023-01-04 NOTE — Progress Notes (Signed)
Cervical spine x-ray shows some arthritis.  No fractures are present.

## 2023-01-06 NOTE — Telephone Encounter (Signed)
Patient is scheduled for previsit 01/18/23 and endoscopy with Dr Russella Dar on 01/31/23

## 2023-01-11 ENCOUNTER — Ambulatory Visit: Payer: Managed Care, Other (non HMO)

## 2023-01-12 ENCOUNTER — Encounter: Payer: Self-pay | Admitting: Oncology

## 2023-01-18 ENCOUNTER — Ambulatory Visit (AMBULATORY_SURGERY_CENTER): Payer: Managed Care, Other (non HMO)

## 2023-01-18 VITALS — Ht 64.0 in | Wt 175.0 lb

## 2023-01-18 DIAGNOSIS — R1310 Dysphagia, unspecified: Secondary | ICD-10-CM

## 2023-01-18 NOTE — Progress Notes (Signed)
No egg or soy allergy known to patient  No issues known to pt with past sedation with any surgeries or procedures Patient denies ever being told they had issues or difficulty with intubation  No FH of Malignant Hyperthermia Pt is not on diet pills Pt is not on  home 02  Pt is not on blood thinners  Pt denies issues with constipation  No A fib or A flutter Have any cardiac testing pending--no  LOA: independent   PV competed with patient. Prep instructions sent via mychart

## 2023-01-20 ENCOUNTER — Encounter: Payer: Self-pay | Admitting: Gastroenterology

## 2023-01-23 ENCOUNTER — Ambulatory Visit: Payer: Managed Care, Other (non HMO) | Admitting: Family Medicine

## 2023-01-23 NOTE — Progress Notes (Deleted)
   Rubin Payor, PhD, LAT, ATC acting as a scribe for Clementeen Graham, MD.  Suzanne [name] is a 47 y.o. female who presents to [facility] at [facility] today for f/u neck and low back pain. Pt was last seen by Dr. Denyse Amass on 12/28/22 and labs were obtained, she was prescribed baclofen, and referred to PT, but never scheduled any visits.  Today, pt reports ***  Dx imaging: 12/28/22 C-spine XR  Pertinent review of systems: ***  Relevant historical information: ***   Exam:  There were no vitals taken for this visit. General: Well Developed, well nourished, and in no acute distress.   MSK: ***    Lab and Radiology Results No results found for this or any previous visit (from the past 72 hour(s)). No results found.     Assessment and Plan: 47 y.o. female with ***   PDMP not reviewed this encounter. No orders of the defined types were placed in this encounter.  No orders of the defined types were placed in this encounter.    Discussed warning signs or symptoms. Please see discharge instructions. Patient expresses understanding.   ***

## 2023-01-31 ENCOUNTER — Encounter: Payer: Self-pay | Admitting: Gastroenterology

## 2023-01-31 ENCOUNTER — Ambulatory Visit (AMBULATORY_SURGERY_CENTER): Payer: Managed Care, Other (non HMO) | Admitting: Gastroenterology

## 2023-01-31 VITALS — BP 140/73 | HR 51 | Temp 98.0°F | Resp 12 | Ht 64.0 in | Wt 172.0 lb

## 2023-01-31 DIAGNOSIS — R1310 Dysphagia, unspecified: Secondary | ICD-10-CM

## 2023-01-31 DIAGNOSIS — K219 Gastro-esophageal reflux disease without esophagitis: Secondary | ICD-10-CM

## 2023-01-31 MED ORDER — SODIUM CHLORIDE 0.9 % IV SOLN: 500.0000 mL | Freq: Once | INTRAVENOUS | Status: DC

## 2023-01-31 NOTE — Progress Notes (Signed)
Pt's states no medical or surgical changes since previsit or office visit. 

## 2023-01-31 NOTE — Op Note (Addendum)
[facility] Patient Name: Suzanne [name] Procedure Date: 01/31/2023 10:08 AM MRN: [id number] Endoscopist: Meryl Dare , MD, [phone] Age: 47 Referring MD:  Date of Birth: Jul 12, 1975 Gender: Female Account #: [id number] Procedure:                Upper GI endoscopy Indications:              Dysphagia, Gastroesophageal reflux disease Medicines:                Monitored Anesthesia Care Procedure:                Pre-Anesthesia Assessment:                           - Prior to the procedure, a History and Physical                            was performed, and patient medications and                            allergies were reviewed. The patient's tolerance of                            previous anesthesia was also reviewed. The risks                            and benefits of the procedure and the sedation                            options and risks were discussed with the patient.                            All questions were answered, and informed consent                            was obtained. Prior Anticoagulants: The patient has                            taken no anticoagulant or antiplatelet agents. ASA                            Grade Assessment: II - A patient with mild systemic                            disease. After reviewing the risks and benefits,                            the patient was deemed in satisfactory condition to                            undergo the procedure.                           After obtaining informed consent, the endoscope was  passed under direct vision. Throughout the                            procedure, the patient's blood pressure, pulse, and                            oxygen saturations were monitored continuously. The                            GIF [id number] #[id number] was introduced through the                            mouth, and advanced to the second part of duodenum.                            The upper  GI endoscopy was accomplished without                            difficulty. The patient tolerated the procedure                            well. Scope In: Scope Out: Findings:                 No endoscopic abnormality was evident in the                            esophagus to explain the patient's complaint of                            dysphagia. It was decided, however, to proceed with                            dilation of the entire esophagus. A guidewire was                            placed and the scope was withdrawn. Dilation was                            performed with a Savary dilator with no resistance                            at 17 mm.                           The examined esophagus was normal, z-line at 37 cm.                           The entire examined stomach was normal.                           The duodenal bulb and second portion of the                            duodenum  were normal. Complications:            No immediate complications. Estimated Blood Loss:     Estimated blood loss: none. Impression:               - No endoscopic esophageal abnormality to explain                            patient's dysphagia. Esophagus dilated.                           - Normal esophagus.                           - Normal stomach.                           - Normal duodenal bulb and second portion of the                            duodenum.                           - No specimens collected. Recommendation:           - Patient has a contact number available for                            emergencies. The signs and symptoms of potential                            delayed complications were discussed with the                            patient. Return to normal activities tomorrow.                            Written discharge instructions were provided to the                            patient.                           - Clear liquid diet for 2 hours, then advance as                             tolerated to soft diet today.                           - Resume prior diet tomorrow.                           - Follow antireflux measures.                           - Continue present medications.                           -  Observe patient's clinical course following                            today's procedure with therapeutic intervention.                           - If dysphagia persists schedule esophagram. Meryl Dare, MD 01/31/2023 10:25:36 AM This report has been signed electronically.

## 2023-01-31 NOTE — Progress Notes (Signed)
Called to room to assist during endoscopic procedure.  Patient ID and intended procedure confirmed with present staff. Received instructions for my participation in the procedure from the performing physician.  

## 2023-01-31 NOTE — Progress Notes (Signed)
Report to PACU, RN, vss, BBS= Clear.  

## 2023-01-31 NOTE — Patient Instructions (Signed)
YOU HAD AN ENDOSCOPIC PROCEDURE TODAY AT THE [facility]:   Refer to the procedure report that was given to you for any specific questions about what was found during the examination.  If the procedure report does not answer your questions, please call your gastroenterologist to clarify.  If you requested that your care partner not be given the details of your procedure findings, then the procedure report has been included in a sealed envelope for you to review at your convenience later. Dilation Diet. Antireflux measures. Continue present medications. Resume previous diet tomorrow. If dysphagia persists schedule esophagram.  YOU SHOULD EXPECT: Some feelings of bloating in the abdomen. Passage of more gas than usual.  Walking can help get rid of the air that was put into your GI tract during the procedure and reduce the bloating. If you had a lower endoscopy (such as a colonoscopy or flexible sigmoidoscopy) you may notice spotting of blood in your stool or on the toilet paper. If you underwent a bowel prep for your procedure, you may not have a normal bowel movement for a few days.  Please Note:  You might notice some irritation and congestion in your nose or some drainage.  This is from the oxygen used during your procedure.  There is no need for concern and it should clear up in a day or so.  SYMPTOMS TO REPORT IMMEDIATELY:   Following upper endoscopy (EGD)  Vomiting of blood or coffee ground material  New chest pain or pain under the shoulder blades  Painful or persistently difficult swallowing  New shortness of breath  Fever of 100F or higher  Black, tarry-looking stools  For urgent or emergent issues, a gastroenterologist can be reached at any hour by calling [phone]. Do not use MyChart messaging for urgent concerns.    DIET:  Drink plenty of fluids but you should avoid alcoholic beverages for 24 hours.  ACTIVITY:  You should plan to take it easy for the rest of  today and you should NOT DRIVE or use heavy machinery until tomorrow (because of the sedation medicines used during the test).    FOLLOW UP: Our staff will call the number listed on your records the next business day following your procedure.  We will call around 7:15- 8:00 am to check on you and address any questions or concerns that you may have regarding the information given to you following your procedure. If we do not reach you, we will leave a message.     If any biopsies were taken you will be contacted by phone or by letter within the next 1-3 weeks.  Please call us at [phone] if you have not heard about the biopsies in 3 weeks.    SIGNATURES/CONFIDENTIALITY: You and/or your care partner have signed paperwork which will be entered into your electronic medical record.  These signatures attest to the fact that that the information above on your After Visit Summary has been reviewed and is understood.  Full responsibility of the confidentiality of this discharge information lies with you and/or your care-partner.

## 2023-01-31 NOTE — Progress Notes (Signed)
 See 01/10/2023 H&P, no changes

## 2023-02-01 ENCOUNTER — Telehealth: Payer: Self-pay | Admitting: *Deleted

## 2023-02-01 NOTE — Telephone Encounter (Signed)
  Follow up Call-     01/31/2023    9:38 AM 01/31/2023    9:30 AM 07/06/2020    7:36 AM  Call back number  Post procedure Call Back phone  # [phone]  Permission to leave phone message Yes Yes Yes     Patient questions:  Do you have a fever, pain , or abdominal swelling? No. Pain Score  0 *- pt c/o mild sore throat. Instructed pt to try gargling with warm salt water or using throat lozenges. Soreness should improve over the next couple of days. Instructed to call back if it does not improve.   Have you tolerated food without any problems? Yes.    Have you been able to return to your normal activities? Yes.    Do you have any questions about your discharge instructions: Diet   No. Medications  No. Follow up visit  No.  Do you have questions or concerns about your Care? No.  Actions: * If pain score is 4 or above: No action needed, pain <4.

## 2023-02-24 ENCOUNTER — Encounter: Payer: Self-pay | Admitting: Oncology

## 2023-02-26 ENCOUNTER — Encounter: Payer: Self-pay | Admitting: Oncology

## 2023-04-03 ENCOUNTER — Other Ambulatory Visit: Payer: Self-pay | Admitting: Family Medicine

## 2023-04-03 NOTE — Telephone Encounter (Signed)
Last OV 12/28/22 Next OV not scheduled  Last refill 12/28/22 Qty #90/1  Pt also prescribed Tizanidine. Will need to confirm which medication pt is currently taking.   Called pt and left VM to call the office.

## 2024-06-10 ENCOUNTER — Ambulatory Visit: Admitting: Family Medicine

## 2024-06-24 ENCOUNTER — Encounter: Payer: Self-pay | Admitting: Oncology

## 2024-06-28 ENCOUNTER — Ambulatory Visit
Admission: EM | Admit: 2024-06-28 | Discharge: 2024-06-28 | Disposition: A | Discharge status: Home / Self Care | Attending: Nurse Practitioner | Admitting: Nurse Practitioner

## 2024-06-28 ENCOUNTER — Telehealth: Payer: Self-pay | Admitting: Family Medicine

## 2024-06-28 ENCOUNTER — Encounter: Payer: Self-pay | Admitting: Nurse Practitioner

## 2024-06-28 DIAGNOSIS — R300 Dysuria: Secondary | ICD-10-CM | POA: Diagnosis not present

## 2024-06-28 DIAGNOSIS — R1032 Left lower quadrant pain: Secondary | ICD-10-CM | POA: Diagnosis present

## 2024-06-28 DIAGNOSIS — N898 Other specified noninflammatory disorders of vagina: Secondary | ICD-10-CM | POA: Insufficient documentation

## 2024-06-28 DIAGNOSIS — Z87898 Personal history of other specified conditions: Secondary | ICD-10-CM | POA: Insufficient documentation

## 2024-06-28 DIAGNOSIS — R030 Elevated blood-pressure reading, without diagnosis of hypertension: Secondary | ICD-10-CM | POA: Insufficient documentation

## 2024-06-28 DIAGNOSIS — R3915 Urgency of urination: Secondary | ICD-10-CM | POA: Insufficient documentation

## 2024-06-28 LAB — POCT URINE DIPSTICK
Bilirubin, UA: NEGATIVE
Glucose, UA: NEGATIVE mg/dL
Ketones, POC UA: NEGATIVE mg/dL
Nitrite, UA: NEGATIVE
POC PROTEIN,UA: 30 — AB
Spec Grav, UA: >=1.03 — AB
Urobilinogen, UA: 0.2 U/dL
pH, UA: 6.5

## 2024-06-28 MED ORDER — NITROFURANTOIN MONOHYD MACRO 100 MG PO CAPS: 100.0000 mg | ORAL_CAPSULE | Freq: Two times a day (BID) | ORAL | 0 refills | Status: AC

## 2024-06-28 NOTE — ED Triage Notes (Signed)
 Pt reports LLQ abdominal pain x 2 days. Pain also in left low back. States she had vaginal discharge with odor- has Hx of BV that has be difficult to treat. States she is not currently sexually active. Reports urinary urgency but only voiding small amounts. Last BM was today- normal per pt

## 2024-06-28 NOTE — Telephone Encounter (Signed)
 I called patient to reschedule her appt due to the weather expected for 2/2.

## 2024-06-28 NOTE — ED Provider Notes (Signed)
 " [name]    CSN: [id number] Arrival date & time: 06/28/24  1612      History   Chief Complaint Chief Complaint  Patient presents with   Abdominal Pain    HPI Suzanne [name] is a 49 y.o. female.   Patient presents requesting evaluation for a 2-day history of left lower quadrant abdominal discomfort, urinary urgency, frequency, and dysuria.  States it feels like a sharp sensation with urination.  She also endorses an abnormal clumpy vaginal discharge.  States she has a history of bacterial vaginitis that has been persistent despite treatment with metronidazole  and boric acid.  She has refrained from sexual activity for the past 3 years because of this.  No reported fever, vomiting, or diarrhea.  No reported bloody stools.  The pain in her abdomen does radiate to her back.  She has not taken any medications for the symptoms.  No recent URI symptoms.  The history is provided by the patient.  Abdominal Pain Associated symptoms: dysuria and vaginal discharge   Associated symptoms: no chest pain, no chills, no cough, no diarrhea, no fever, no shortness of breath, no sore throat and no vomiting     Past Medical History:  Diagnosis Date   Abnormal Pap smear of cervix    Allergy    Anemia    Anxiety    Arthritis    Asthma    Chest pain    Chronic lower back pain    Depression    Fibroid    GERD (gastroesophageal reflux disease)    Hypertension    Migraine    Migraines    Neck pain, chronic    Status post dilation of esophageal narrowing    STD (sexually transmitted disease)    HSV1, in eye    Patient Active Problem List   Diagnosis Date Noted   Abdominal pain with vomiting 12/19/2022   COVID-19 virus infection 12/19/2022   Asthma 12/19/2022   History of herpes simplex keratitis 07/08/2022   Chronic migraine with aura without status migrainosus, not intractable 06/22/2022   History of herpes zoster 07/17/2020   Moderate persistent asthma 12/09/2019    Iron deficiency anemia 10/01/2018   Thalassemia 10/01/2018   Galactorrhea 07/19/2018   Malnutrition of moderate degree 07/19/2018   Abnormal TSH 07/19/2018   Nummular keratitis of right eye, 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/2018   Aortic atherosclerosis 07/19/2018   Murmur, cardiac 03/26/2018   Former smoker, stopped 2019 03/26/2018   History of hysterectomy 11/25/2016   Unintentional weight loss 08/17/2016   B12 deficiency 08/12/2016   Daily nausea 08/06/2016   Microcytic anemia 08/06/2016   Chronic diarrhea 08/06/2016   Intestinal malabsorption 08/06/2016   Lung nodule < 6cm on CT 08/06/2016   Essential hypertension 07/02/2013   Atypical chest pain 07/02/2013   Shortness of breath 07/02/2013   Other and unspecified ovarian cyst 04/01/2013   Dysphagia 12/22/2011    Past Surgical History:  Procedure Laterality Date   COLPOSCOPY     ESOPHAGEAL DILATION     ESOPHAGEAL MANOMETRY N/A 04/01/2013   Procedure: ESOPHAGEAL MANOMETRY (EM);  Surgeon: Toribio SHAUNNA Cedar, MD;  Location: [facility];  Service: Endoscopy;  Laterality: N/A;   ESOPHAGOGASTRODUODENOSCOPY (EGD) WITH ESOPHAGEAL DILATION N/A 02/28/2013   Procedure: ESOPHAGOGASTRODUODENOSCOPY (EGD) WITH ESOPHAGEAL DILATION;  Surgeon: Toribio SHAUNNA Cedar, MD;  Location: [facility];  Service: Endoscopy;  Laterality: N/A;   TUBAL LIGATION     VAGINAL HYSTERECTOMY      OB History  Gravida  3   Para  2   Term      Preterm  2   AB  1   Living  1      SAB      IAB  1   Ectopic      Multiple      Live Births  2            Home Medications    Prior to Admission medications  Medication Sig Start Date End Date Taking? Authorizing Provider  albuterol  (PROVENTIL ) (2.5 MG/3ML) 0.083% nebulizer solution Take 3 mLs (2.5 mg total) by nebulization every 6 (six) hours as needed for wheezing or shortness of breath. 12/24/22  Yes Regalado, Belkys A, MD  amLODipine  (NORVASC ) 10 MG tablet Take 1 tablet (10 mg total) by mouth daily.  12/24/22  Yes Regalado, Belkys A, MD  baclofen  (LIORESAL ) 10 MG tablet Take 1 tablet (10 mg total) by mouth 3 (three) times daily as needed for muscle spasms. 12/28/22  Yes Joane Artist RAMAN, MD  cetirizine  (ZYRTEC  ALLERGY) 10 MG tablet Take 1 tablet (10 mg total) by mouth daily. 12/24/22  Yes Regalado, Belkys A, MD  cloNIDine  (CATAPRES ) 0.1 MG tablet Take 0.1 mg by mouth 2 (two) times daily. 11/13/23  Yes [provider]  cyclobenzaprine (FLEXERIL) 10 MG tablet Take 10 mg by mouth 3 (three) times daily as needed. 03/27/23  Yes [provider]  EPINEPHrine 0.3 mg/0.3 mL IJ SOAJ injection Inject 0.3 mg into the muscle as needed. 02/23/23  Yes [provider]  estradiol  (CLIMARA  - DOSED IN MG/24 HR) 0.025 mg/24hr patch Place 0.025 mg onto the skin once a week. 12/16/22  Yes [provider]  ferrous sulfate  325 (65 FE) MG tablet Take 325 mg by mouth daily with breakfast.   Yes [provider]  metroNIDAZOLE  (METROGEL ) 1 % gel Apply 1 Application topically See admin instructions. Apply as directed 2 times a day- to affected area(s)   Yes [provider]  Multiple Vitamins-Calcium (ONE-A-DAY WOMENS FORMULA) TABS Take 1 tablet by mouth daily. 12/24/22  Yes Regalado, Belkys A, MD  nitrofurantoin , macrocrystal-monohydrate, (MACROBID ) 100 MG capsule Take 1 capsule (100 mg total) by mouth 2 (two) times daily. 06/28/24  Yes Janet Therisa PARAS, FNP  ondansetron  (ZOFRAN -ODT) 4 MG disintegrating tablet Take 4 mg by mouth every 8 (eight) hours as needed. 01/10/23  Yes [provider]  pantoprazole  (PROTONIX ) 40 MG tablet Take 1 tablet (40 mg total) by mouth 2 (two) times daily. 12/24/22 06/28/24 Yes Regalado, Belkys A, MD  promethazine  (PHENERGAN ) 12.5 MG tablet Take 12.5 mg by mouth every 6 (six) hours as needed. 01/10/23  Yes [provider]  rizatriptan  (MAXALT ) 10 MG tablet Take 1 tablet (10 mg total) by mouth as needed for migraine (may repeat once in 2 hours,  if no relief). 12/24/22  Yes Regalado, Belkys A, MD  sucralfate  (CARAFATE ) 1 GM/10ML suspension Take 10 mLs (1 g total) by mouth 4 (four) times daily -  with meals and at bedtime. 12/24/22  Yes Regalado, Belkys A, MD  tiZANidine  (ZANAFLEX ) 4 MG tablet Take 4 mg by mouth every 8 (eight) hours as needed. 01/10/23  Yes [provider]  topiramate (TOPAMAX) 25 MG tablet Take by mouth. 01/10/23 06/28/24 Yes [provider]  valACYclovir  (VALTREX ) 1000 MG tablet Take 1,000 mg by mouth daily. 07/01/23  Yes [provider]  valsartan  (DIOVAN ) 80 MG tablet Take 240 mg by mouth daily. 01/08/24  Yes [provider]  Vitamin D , Ergocalciferol , (DRISDOL ) 1.25 MG (50000 UNIT) CAPS capsule Take 50,000 Units by mouth every 7 (seven) days. 06/01/23  Yes [provider]  cloNIDine  (CATAPRES  - DOSED IN MG/24 HR) 0.2 mg/24hr patch Place 1 patch (0.2 mg total) onto the skin once a week. Patient not taking: Reported on 06/28/2024 12/27/22   Regalado, Owen A, MD  HYDROMET 5-1.5 MG/5ML syrup Take 5 mLs by mouth 4 (four) times daily as needed. Patient not taking: Reported on 06/28/2024 01/04/23   [provider]  fluticasone  (FLONASE ) 50 MCG/ACT nasal spray Place 1 spray into both nostrils daily. 10/04/19 11/04/19  Hall-Potvin, Brittany, PA-C    Family History Family History  Problem Relation Age of Onset   Hypertension Mother    Heart disease Father        H/O CABG   Hypertension Maternal Aunt    Thyroid  disease Maternal Aunt    Hypertension Maternal Grandmother    Thyroid  disease Maternal Grandmother    Diabetes Maternal Grandmother    Hypertension Paternal Grandmother    Diabetes Paternal Grandmother    Cancer - Lung Paternal Grandmother    Esophageal cancer Other        age unknow   Colon cancer Neg Hx    Stomach cancer Neg Hx    Rectal cancer Neg Hx     Social History Social History[1]   Allergies   Iodinated contrast media, Reglan [metoclopramide],  Amoxicillin, Gabapentin, Pregabalin , Lisinopril, Losartan, and Sulfa antibiotics   Review of Systems Review of Systems  Constitutional:  Negative for chills and fever.  HENT:  Negative for congestion, rhinorrhea and sore throat.   Eyes:  Negative for pain and redness.  Respiratory:  Negative for cough and shortness of breath.   Cardiovascular:  Negative for chest pain.  Gastrointestinal:  Positive for abdominal pain. Negative for diarrhea and vomiting.  Genitourinary:  Positive for dysuria, frequency, urgency and vaginal discharge.  Musculoskeletal:  Positive for back pain. Negative for myalgias.  Neurological:  Negative for dizziness and headaches.  Psychiatric/Behavioral:  The patient is not nervous/anxious.      Physical Exam Triage Vital Signs ED Triage Vitals  Encounter Vitals Group     BP 06/28/24 1641 (!) 168/97     Girls Systolic BP Percentile --      Girls Diastolic BP Percentile --      Boys Systolic BP Percentile --      Boys Diastolic BP Percentile --      Pulse Rate 06/28/24 1641 74     Resp 06/28/24 1641 16     Temp --      Temp Source 06/28/24 1641 Oral     SpO2 06/28/24 1641 99 %     Weight --      Height --      Head Circumference --      Peak Flow --      Pain Score 06/28/24 1637 8     Pain Loc --      Pain Education --      Exclude from Growth Chart --    No data found.  Updated Vital Signs BP (!) 168/97 (BP Location: Right Arm)   Pulse 74   Resp 16   SpO2 99%   Visual Acuity Right Eye Distance:   Left Eye Distance:   Bilateral Distance:    Right Eye Near:   Left Eye Near:    Bilateral Near:     Physical Exam  Vitals and nursing note reviewed.  Constitutional:      Appearance: Normal appearance.  Cardiovascular:     Rate and Rhythm: Normal rate and regular rhythm.     Heart sounds: Normal heart sounds. No murmur heard. Pulmonary:     Effort: Pulmonary effort is normal.     Breath sounds: Normal breath sounds. No wheezing, rhonchi or  rales.  Abdominal:     General: Bowel sounds are normal.     Palpations: Abdomen is soft.     Tenderness: There is abdominal tenderness.     Comments: LLQ; positive for left CVA tenderness.  Musculoskeletal:     Cervical back: Normal range of motion.  Skin:    General: Skin is warm and dry.  Neurological:     General: No focal deficit present.     Mental Status: She is alert and oriented to person, place, and time.  Psychiatric:        Mood and Affect: Mood normal.        Behavior: Behavior normal.        Thought Content: Thought content normal.        Judgment: Judgment normal.      UC Treatments / Results  Labs (all labs ordered are listed, but only abnormal results are displayed) Labs Reviewed  POCT URINE DIPSTICK - Abnormal; Notable for the following components:      Result Value   Clarity, UA cloudy (*)    Spec Grav, UA >=1.030 (*)    Blood, UA trace-intact (*)    POC PROTEIN,UA =30 (*)    Leukocytes, UA Trace (*)    All other components within normal limits  URINE CULTURE  CERVICOVAGINAL ANCILLARY ONLY    EKG   Radiology No results found.  Procedures Procedures (including critical care time)  Medications Ordered in UC Medications - No data to display  Initial Impression / Assessment and Plan / UC Course  I have reviewed the triage vital signs and the nursing notes.  Pertinent labs & imaging results that were available during my care of the patient were reviewed by me and considered in my medical decision making (see chart for details).    Patient presented for evaluation of a 2-day history of urinary urgency, frequency, and dysuria.  Also is experiencing some left lower quadrant abdominal discomfort and abnormal vaginal discharge.  POCT UA was concerning for possible UTI-Will start Macrobid  twice daily for 5 days and send urine culture for further testing.  Additionally, patient self collected vaginal swab for bacterial vaginosis and yeast has been sent  to the lab.  Patient aware that we will notify her if any further treatment is recommended.  I advised her to follow-up with her gynecologist to discuss further measures to help prevent bacterial vaginosis.  Recommended use of Tylenol  and/or ibuprofen as needed for discomfort.  Red flags were reviewed which would warrant emergency department evaluation.  Her blood pressure was elevated at 168/97-have recommended recheck at home and follow-up with her PCP if it remains greater than 140/90.  Final Clinical Impressions(s) / UC Diagnoses   Final diagnoses:  Urinary urgency  Vaginal discharge  Left lower quadrant abdominal pain  Elevated blood pressure reading     Discharge Instructions      A prescription for an antibiotic has been sent to the pharmacy on file for a possible UTI.  We will contact you if this medication needs to be changed for any reason based on the urine culture results.  Pending bacterial vaginitis/yeast testing - will notify you if your treatment needs to be changed based on these results. Close observation for worsening abdominal pain, blood stools, vomiting, fever, or other worsening symptoms - report to the Emergency Department for further evaluation if so. Follow up with a gynecologist to talk about your persistent concerns with bacterial vaginosis. Your blood pressure was elevated - please recheck this at home and follow up with your primary provider if it remains > 140/90.    ED Prescriptions     Medication Sig Dispense Auth. Provider   nitrofurantoin , macrocrystal-monohydrate, (MACROBID ) 100 MG capsule Take 1 capsule (100 mg total) by mouth 2 (two) times daily. 10 capsule Janet Therisa PARAS, FNP      PDMP not reviewed this encounter.    [1]  Social History Tobacco Use   Smoking status: Former    Current packs/day: 0.00    Average packs/day: 0.1 packs/day for 10.0 years (1.0 ttl pk-yrs)    Types: Cigars, Cigarettes    Start date: 12/07/2007    Quit date:  12/06/2017    Years since quitting: 6.5   Smokeless tobacco: Never   Tobacco comments:    smokes 2 Black & Milds per day 08/19/13  Vaping Use   Vaping status: Never Used  Substance Use Topics   Alcohol use: Not Currently   Drug use: Not Currently    Types: Marijuana     Janet Therisa PARAS, FNP 06/28/24 1722  "

## 2024-06-30 LAB — URINE CULTURE

## 2024-07-01 ENCOUNTER — Ambulatory Visit: Admitting: Family Medicine

## 2024-07-01 LAB — CERVICOVAGINAL ANCILLARY ONLY
Bacterial Vaginitis (gardnerella): NEGATIVE
Candida Glabrata: NEGATIVE
Candida Vaginitis: NEGATIVE
Comment: NEGATIVE
Comment: NEGATIVE
Comment: NEGATIVE

## 2024-07-02 ENCOUNTER — Encounter: Payer: Self-pay | Admitting: Oncology

## 2024-07-02 ENCOUNTER — Ambulatory Visit (HOSPITAL_COMMUNITY): Payer: Self-pay

## 2024-07-03 ENCOUNTER — Ambulatory Visit
Admission: EM | Admit: 2024-07-03 | Discharge: 2024-07-03 | Disposition: A | Discharge status: Home / Self Care | Source: Home / Self Care

## 2024-07-03 DIAGNOSIS — R3915 Urgency of urination: Secondary | ICD-10-CM

## 2024-07-03 NOTE — ED Triage Notes (Signed)
 Pt here to recollect urine for culture as directed

## 2024-07-04 LAB — URINE CULTURE
Culture: NO GROWTH
Special Requests: NORMAL

## 2024-08-19 ENCOUNTER — Ambulatory Visit: Payer: Self-pay | Admitting: Family Medicine
